# Patient Record
Sex: Male | Born: 1940 | Hispanic: No | Marital: Married | Smoking: Never smoker
Health system: Southern US, Community
[De-identification: ages and names within clinical notes are randomized; demographics above are authoritative.]

## PROBLEM LIST (undated history)

## (undated) DIAGNOSIS — G4730 Sleep apnea, unspecified: Secondary | ICD-10-CM

## (undated) DIAGNOSIS — R7303 Prediabetes: Secondary | ICD-10-CM

## (undated) DIAGNOSIS — H8110 Benign paroxysmal vertigo, unspecified ear: Secondary | ICD-10-CM

## (undated) DIAGNOSIS — K449 Diaphragmatic hernia without obstruction or gangrene: Secondary | ICD-10-CM

## (undated) DIAGNOSIS — IMO0002 Reserved for concepts with insufficient information to code with codable children: Secondary | ICD-10-CM

## (undated) DIAGNOSIS — E785 Hyperlipidemia, unspecified: Secondary | ICD-10-CM

## (undated) DIAGNOSIS — F0390 Unspecified dementia without behavioral disturbance: Secondary | ICD-10-CM

## (undated) DIAGNOSIS — M1990 Unspecified osteoarthritis, unspecified site: Secondary | ICD-10-CM

## (undated) DIAGNOSIS — N400 Enlarged prostate without lower urinary tract symptoms: Secondary | ICD-10-CM

## (undated) DIAGNOSIS — I10 Essential (primary) hypertension: Secondary | ICD-10-CM

## (undated) DIAGNOSIS — K219 Gastro-esophageal reflux disease without esophagitis: Secondary | ICD-10-CM

## (undated) HISTORY — DX: Essential (primary) hypertension: I10

## (undated) HISTORY — PX: FACIAL LACERATIONS REPAIR: SHX1571

## (undated) HISTORY — DX: Hyperlipidemia, unspecified: E78.5

## (undated) HISTORY — DX: Gastro-esophageal reflux disease without esophagitis: K21.9

## (undated) HISTORY — DX: Reserved for concepts with insufficient information to code with codable children: IMO0002

## (undated) HISTORY — DX: Benign prostatic hyperplasia without lower urinary tract symptoms: N40.0

## (undated) HISTORY — DX: Sleep apnea, unspecified: G47.30

---

## 2013-01-21 ENCOUNTER — Ambulatory Visit: Payer: No Typology Code available for payment source | Attending: Internal Medicine | Admitting: Internal Medicine

## 2013-01-21 ENCOUNTER — Ambulatory Visit (HOSPITAL_COMMUNITY)
Admission: RE | Admit: 2013-01-21 | Discharge: 2013-01-21 | Disposition: A | Discharge status: Home / Self Care | Payer: No Typology Code available for payment source | Source: Ambulatory Visit | Attending: Internal Medicine | Admitting: Internal Medicine

## 2013-01-21 VITALS — BP 144/99 | HR 64 | Temp 98.2°F | Resp 16 | Ht 72.0 in | Wt 227.0 lb

## 2013-01-21 DIAGNOSIS — M1710 Unilateral primary osteoarthritis, unspecified knee: Secondary | ICD-10-CM

## 2013-01-21 DIAGNOSIS — K219 Gastro-esophageal reflux disease without esophagitis: Secondary | ICD-10-CM

## 2013-01-21 DIAGNOSIS — M1712 Unilateral primary osteoarthritis, left knee: Secondary | ICD-10-CM

## 2013-01-21 DIAGNOSIS — M25569 Pain in unspecified knee: Secondary | ICD-10-CM | POA: Insufficient documentation

## 2013-01-21 LAB — CBC WITH DIFFERENTIAL/PLATELET
Basophils Absolute: 0.1 10*3/uL (ref 0.0–0.1)
HCT: 48.1 % (ref 39.0–52.0)
Hemoglobin: 16.5 g/dL (ref 13.0–17.0)
Lymphocytes Relative: 56 % — ABNORMAL HIGH (ref 12–46)
Monocytes Absolute: 0.6 10*3/uL (ref 0.1–1.0)
Monocytes Relative: 12 % (ref 3–12)
Neutro Abs: 1.3 10*3/uL — ABNORMAL LOW (ref 1.7–7.7)
WBC: 4.8 10*3/uL (ref 4.0–10.5)

## 2013-01-21 MED ORDER — TRAMADOL HCL 50 MG PO TABS: 50.0000 mg | ORAL_TABLET | Freq: Three times a day (TID) | ORAL | Status: DC | PRN

## 2013-01-21 MED ORDER — TRAMADOL-ACETAMINOPHEN 37.5-325 MG PO TABS: 1.0000 | ORAL_TABLET | Freq: Four times a day (QID) | ORAL | Status: DC | PRN

## 2013-01-21 MED ORDER — ESOMEPRAZOLE MAGNESIUM 40 MG PO CPDR: 40.0000 mg | DELAYED_RELEASE_CAPSULE | Freq: Every day | ORAL | Status: DC

## 2013-01-21 NOTE — Progress Notes (Signed)
Patient presents to establish care; states stiffness and pain in left and right knees; also complains of dizziness when standing up; also complains of acid reflux and nausea after eating x 5 years.

## 2013-01-21 NOTE — Progress Notes (Signed)
Patient ID: Brandon Clark, male   DOB: [birth year], 72 y.o.   MRN: [id number]  CC:  HPI:  72 [facility] care. The patient states that he has had a shuffling gait for one year he complains of severe left knee pain and pain with weight bearing. The patient has been applying icy hot ointment to help control the pain in his knee. He denies taking any other over-the-counter pain killers. His daughter accompanies him and works as an Equities trader He also complains of severe gastroesophageal reflux disease. [place] the patient had an EGD and was told that he needs to have what sounds like dilatation. The patient also needed after enterolysis and follow he denies any recent weight loss   No Known Allergies Past Medical History  Diagnosis Date  . Acid reflux   . Ulcer    No current outpatient prescriptions on file prior to visit.   No current facility-administered medications on file prior to visit.   Family History  Problem Relation Age of Onset  . Diabetes Mother    History   Social History  . Marital Status: Married    Spouse Name: N/A    Number of Children: N/A  . Years of Education: N/A   Occupational History  . Not on file.   Social History Main Topics  . Smoking status: Never Smoker   . Smokeless tobacco: Not on file  . Alcohol Use: No  . Drug Use: No  . Sexually Active: No   Other Topics Concern  . Not on file   Social History Narrative  . No narrative on file    Review of Systems  Constitutional: Negative for fever, chills, diaphoresis, activity change, appetite change and fatigue.  HENT: Negative for ear pain, nosebleeds, congestion, facial swelling, rhinorrhea, neck pain, neck stiffness and ear discharge.   Eyes: Negative for pain, discharge, redness, itching and visual disturbance.  Respiratory: Negative for cough, choking, chest tightness, shortness of  breath, wheezing and stridor.   Cardiovascular: Negative for chest pain, palpitations and leg swelling.  Gastrointestinal: Negative for abdominal distention.  Genitourinary: Negative for dysuria, urgency, frequency, hematuria, flank pain, decreased urine volume, difficulty urinating and dyspareunia.  Musculoskeletal: Negative for back pain, joint swelling, arthralgias and gait problem.  Neurological: Negative for dizziness, tremors, seizures, syncope, facial asymmetry, speech difficulty, weakness, light-headedness, numbness and headaches.  Hematological: Negative for adenopathy. Does not bruise/bleed easily.  Psychiatric/Behavioral: Negative for hallucinations, behavioral problems, confusion, dysphoric mood, decreased concentration and agitation.    Objective:   Filed Vitals:   01/21/13 1207  BP: 144/99  Pulse: 64  Temp: 98.2 F (36.8 C)  Resp: 16    Physical Exam  Constitutional: Appears well-developed and well-nourished. No distress.  HENT: Normocephalic. External right and left ear normal. Oropharynx is clear and moist.  Eyes: Conjunctivae and EOM are normal. PERRLA, no scleral icterus.  Neck: Normal ROM. Neck supple. No JVD. No tracheal deviation. No thyromegaly.  CVS: RRR, S1/S2 +, no murmurs, no gallops, no carotid bruit.  Pulmonary: Effort and breath sounds normal, no stridor, rhonchi, wheezes, rales.  Abdominal: Soft. BS +,  no distension, tenderness, rebound or guarding.  Musculoskeletal: Normal range of motion. No edema and no tenderness.  Lymphadenopathy: No lymphadenopathy noted, cervical, inguinal. Neuro: Alert. Normal reflexes, muscle tone coordination. No cranial nerve deficit. Skin: Skin is warm and dry. No rash noted. Not diaphoretic. No erythema. No pallor.  Psychiatric: Normal mood and affect. Behavior, judgment, thought content normal.   No results found for this basename: WBC, HGB, HCT, MCV, PLT   No results found for this basename: CREATININE, BUN, NA, K,  CL, CO2    No results found for this basename: HGBA1C   Lipid Panel  No results found for this basename: chol, trig, hdl, cholhdl, vldl, ldlcalc       Assessment and plan:   There are no active problems to display for this patient.      Left knee osteoarthritis Plain films will be obtained of both the right and the left knee The patient probably needs a left knee arthroplasty Referral to orthopedics has been provided    New to establish care We'll obtain baseline labs including CBC, CMP, hemoglobin A1c, vitamin D, vitamin B 12  Reflux Disease Referral to Gastroenterology Has Been Provided the Patient Has Been Started on Nexium

## 2013-01-22 LAB — COMPREHENSIVE METABOLIC PANEL
Albumin: 4.2 g/dL (ref 3.5–5.2)
BUN: 17 mg/dL (ref 6–23)
Calcium: 9.7 mg/dL (ref 8.4–10.5)
Chloride: 101 mEq/L (ref 96–112)
Glucose, Bld: 114 mg/dL — ABNORMAL HIGH (ref 70–99)
Potassium: 4.9 mEq/L (ref 3.5–5.3)

## 2013-01-22 LAB — HEMOGLOBIN A1C: Mean Plasma Glucose: 114 mg/dL (ref <117)

## 2013-01-22 LAB — LIPID PANEL: Cholesterol: 210 mg/dL — ABNORMAL HIGH (ref 0–200)

## 2013-01-22 LAB — VITAMIN B12: Vitamin B-12: 1084 pg/mL — ABNORMAL HIGH (ref 211–911)

## 2013-01-23 ENCOUNTER — Encounter: Payer: Self-pay | Admitting: Gastroenterology

## 2013-02-01 ENCOUNTER — Telehealth: Payer: Self-pay | Admitting: Family Medicine

## 2013-02-01 NOTE — Telephone Encounter (Signed)
Zane Herald called in for Mr. ZOXWRUE; pt is experiencing a lot of pain, medication is not working and pt is having trouble walking;

## 2013-02-04 ENCOUNTER — Ambulatory Visit (INDEPENDENT_AMBULATORY_CARE_PROVIDER_SITE_OTHER): Payer: No Typology Code available for payment source | Admitting: Family Medicine

## 2013-02-04 ENCOUNTER — Encounter: Payer: Self-pay | Admitting: Family Medicine

## 2013-02-04 VITALS — BP 122/88 | HR 76 | Ht 75.0 in | Wt 220.0 lb

## 2013-02-04 DIAGNOSIS — M25569 Pain in unspecified knee: Secondary | ICD-10-CM

## 2013-02-04 DIAGNOSIS — M25562 Pain in left knee: Secondary | ICD-10-CM

## 2013-02-04 NOTE — Patient Instructions (Addendum)
Take tylenol 500mg  1-2 tabs three times a day for pain. Aleve 1-2 tabs twice a day with food Glucosamine sulfate 750mg  twice a day is a supplement that has been shown to help with arthritis in some studies. Capsaicin topically up to four times a day may also help with pain. Cortisone injections are an option - you were given these today. If cortisone injections do not help, there are different types of shots that may help but they take longer to take effect. It's important that you continue to stay active. Consider physical therapy to strengthen muscles around the joint that hurts to take pressure off of the joint itself. Shoe inserts with good arch support may be helpful. Walker or cane if needed. Heat or ice 15 minutes at a time 3-4 times a day as needed to help with pain. Water aerobics and cycling with low resistance are the best two types of exercise for arthritis. Follow up with me in 1 month to 6 weeks for reevaluation.

## 2013-02-05 ENCOUNTER — Ambulatory Visit: Payer: No Typology Code available for payment source | Admitting: Family Medicine

## 2013-02-05 ENCOUNTER — Encounter: Payer: Self-pay | Admitting: Family Medicine

## 2013-02-05 DIAGNOSIS — M25562 Pain in left knee: Secondary | ICD-10-CM | POA: Insufficient documentation

## 2013-02-05 NOTE — Progress Notes (Signed)
Patient ID: Brandon Clark, male   DOB: October 02, 1940, 72 y.o.   MRN: [id number]  PCP: Standley Dakins, MD  Subjective:   HPI: Patient is a 72 y.o. male here for bilateral knee pain.  Patient denies known injury. Reports about 5 years of worsening pain in both knees, worse in left knee. No prior problems with knees. Worse with bending down. Pain used to come and go but is more consistent now. Difficult getting up from seated position. Tried tylenol, tramadol, sports cream. + swelling. No catching, locking.  Past Medical History  Diagnosis Date  . Acid reflux   . Ulcer     Current Outpatient Prescriptions on File Prior to Visit  Medication Sig Dispense Refill  . esomeprazole (NEXIUM) 40 MG capsule Take 1 capsule (40 mg total) by mouth daily.  30 capsule  3  . traMADol (ULTRAM) 50 MG tablet Take 1 tablet (50 mg total) by mouth every 8 (eight) hours as needed for pain.  120 tablet  0   No current facility-administered medications on file prior to visit.    History reviewed. No pertinent past surgical history.  No Known Allergies  History   Social History  . Marital Status: Married    Spouse Name: N/A    Number of Children: N/A  . Years of Education: N/A   Occupational History  . Not on file.   Social History Main Topics  . Smoking status: Never Smoker   . Smokeless tobacco: Not on file  . Alcohol Use: No  . Drug Use: No  . Sexually Active: No   Other Topics Concern  . Not on file   Social History Narrative  . No narrative on file    Family History  Problem Relation Age of Onset  . Diabetes Mother   . Diabetes Brother   . Heart attack Neg Hx   . Hyperlipidemia Neg Hx   . Hypertension Neg Hx   . Sudden death Neg Hx     BP 122/88  Pulse 76  Ht 6\' 3"  (1.905 m)  Wt 220 lb (99.791 kg)  BMI 27.5 kg/m2  Review of Systems: See HPI above.    Objective:  Physical Exam:  Gen: NAD  L knee: Mild effusion.  No other gross deformity,  ecchymoses. TTP medial joint line.  No lateral joint line, post patellar facet TTP. FROM. Negative ant/post drawers. Negative valgus/varus testing. Negative lachmanns. Negative mcmurrays, apleys, patellar apprehension. NV intact distally.  R knee: No gross deformity, ecchymoses, swelling. TTP medial joint line.  No lateral joint line, post patellar facet TTP. FROM. Negative ant/post drawers. Negative valgus/varus testing. Negative lachmanns. Negative mcmurrays, apleys, patellar apprehension. NV intact distally.    Assessment & Plan:  1. 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  Discussed tylenol, nsaids, glucosamine, capsaicin.  Bilateral cortisone injections given today as well.  Consider PT - declined for now.  Heat/ice as needed.  F/u in 1 month to 6 weeks.  After informed written consent, patient was seated on exam table. Right knee was prepped with alcohol swab and utilizing anteromedial approach, patient's right knee was injected intraarticularly with 3:1 marcaine: depomedrol. Patient tolerated the procedure well without immediate complications.  After informed written consent, patient was lying supine on exam table. Left knee was prepped with alcohol swab and utilizing superolateral approach, patient's left knee was injected intraarticularly with 3:1 marcaine: depomedrol. Patient tolerated the procedure well without immediate complications.

## 2013-02-05 NOTE — Assessment & Plan Note (Signed)
2/2 DJD.  Discussed tylenol, nsaids, glucosamine, capsaicin.  Bilateral cortisone injections given today as well.  Consider PT - declined for now.  Heat/ice as needed.  F/u in 1 month to 6 weeks.  After informed written consent, patient was seated on exam table. Right knee was prepped with alcohol swab and utilizing anteromedial approach, patient's right knee was injected intraarticularly with 3:1 marcaine: depomedrol. Patient tolerated the procedure well without immediate complications.  After informed written consent, patient was lying supine on exam table. Left knee was prepped with alcohol swab and utilizing superolateral approach, patient's left knee was injected intraarticularly with 3:1 marcaine: depomedrol. Patient tolerated the procedure well without immediate complications.

## 2013-02-11 ENCOUNTER — Telehealth: Payer: Self-pay | Admitting: *Deleted

## 2013-02-11 NOTE — Telephone Encounter (Signed)
02/11/13 Attempt to reach patient not available at number provided . P.[facility]  BSN MHA

## 2013-02-15 ENCOUNTER — Ambulatory Visit: Payer: No Typology Code available for payment source | Admitting: Family Medicine

## 2013-02-20 ENCOUNTER — Ambulatory Visit: Payer: No Typology Code available for payment source | Attending: Family Medicine | Admitting: Internal Medicine

## 2013-02-20 ENCOUNTER — Encounter: Payer: Self-pay | Admitting: Internal Medicine

## 2013-02-20 ENCOUNTER — Ambulatory Visit: Payer: No Typology Code available for payment source | Admitting: Gastroenterology

## 2013-02-20 VITALS — BP 132/94 | HR 70 | Temp 98.0°F | Resp 16 | Ht 74.0 in | Wt 228.0 lb

## 2013-02-20 DIAGNOSIS — R300 Dysuria: Secondary | ICD-10-CM | POA: Insufficient documentation

## 2013-02-20 DIAGNOSIS — M25569 Pain in unspecified knee: Secondary | ICD-10-CM | POA: Insufficient documentation

## 2013-02-20 DIAGNOSIS — K219 Gastro-esophageal reflux disease without esophagitis: Secondary | ICD-10-CM

## 2013-02-20 DIAGNOSIS — M25561 Pain in right knee: Secondary | ICD-10-CM

## 2013-02-20 DIAGNOSIS — G4733 Obstructive sleep apnea (adult) (pediatric): Secondary | ICD-10-CM

## 2013-02-20 MED ORDER — OMEPRAZOLE 40 MG PO CPDR: 40.0000 mg | DELAYED_RELEASE_CAPSULE | Freq: Every day | ORAL | Status: DC

## 2013-02-20 NOTE — Progress Notes (Signed)
PT HERE S/P F/U TO REVIEW LABS,KNEE PAIN AND SLIGHT BURN WITH URINATION.  PT WAS SEEN AT [facility] AND TREATED WITH CORTISONE INJECTION-F/U IN NEXT WEEK. STATES HE FEELS BETTER VSS. NEED ALTERNATIVE FOR NEXIUM. TOO EXPENSIVE

## 2013-02-20 NOTE — Progress Notes (Signed)
Patient Demographics  Brandon Clark, is a 72 y.o. male  ZOX:[id number]  WJX:[id number]  DOB - 09-Mar-1941  Chief Complaint  Patient presents with  . Follow-up    REVIEW LABS,KNEE PAIN,DYSURIA        Subjective:   Brandon Clark OZHYQMV today is here for a follow up visit. Patient was next last visit, however he is been unable to afford it and he is not taking any PPI at all. His main complaints today are persistent reflux-like symptoms and loud snoring at night and somnolence during the daytime. His knees are much better after injection of cortisone at the sports medicine clinic.  Patient has No headache, No chest pain, No abdominal pain - No Nausea, No new weakness tingling or numbness, No Cough - SOB.  Objective:    Filed Vitals:   02/20/13 1025  BP: 132/94  Pulse: 70  Temp: 98 F (36.7 C)  TempSrc: Oral  Resp: 16  Height: 6\' 2"  (1.88 m)  Weight: 228 lb (103.42 kg)  SpO2: 96%     ALLERGIES:  No Known Allergies  PAST MEDICAL HISTORY: Past Medical History  Diagnosis Date  . Acid reflux   . Ulcer     MEDICATIONS AT HOME: Prior to Admission medications   Medication Sig Start Date End Date Taking? Authorizing Provider  esomeprazole (NEXIUM) 40 MG capsule Take 1 capsule (40 mg total) by mouth daily. 01/21/13   Brandon Overlie, MD  omeprazole (PRILOSEC) 40 MG capsule Take 1 capsule (40 mg total) by mouth daily. 02/20/13   Brandon Levora Dredge, MD  traMADol (ULTRAM) 50 MG tablet Take 1 tablet (50 mg total) by mouth every 8 (eight) hours as needed for pain. 01/21/13   Brandon Overlie, MD     Exam  General appearance :Awake, alert, not in any distress. Speech Clear. Not toxic Looking HEENT: Atraumatic and Normocephalic, pupils equally reactive to light and accomodation Neck: supple, no JVD. No cervical lymphadenopathy.  Chest:Good air entry bilaterally, no added sounds  CVS: S1 S2 regular, no murmurs.  Abdomen: Bowel sounds present, Non tender and not distended with no  gaurding, rigidity or rebound. Extremities: B/L Lower Ext shows no edema, both legs are warm to touch Neurology: Awake alert, and oriented X 3, CN II-XII intact, Non focal Skin:No Rash Wounds:N/A    Data Review   CBC No results found for this basename: WBC, HGB, HCT, PLT, MCV, MCH, MCHC, RDW, NEUTRABS, LYMPHSABS, MONOABS, EOSABS, BASOSABS, BANDABS, BANDSABD,  in the last 168 hours  Chemistries   No results found for this basename: NA, K, CL, CO2, GLUCOSE, BUN, CREATININE, GFRCGP, CALCIUM, MG, AST, ALT, ALKPHOS, BILITOT,  in the last 168 hours ------------------------------------------------------------------------------------------------------------------ No results found for this basename: HGBA1C,  in the last 72 hours ------------------------------------------------------------------------------------------------------------------ No results found for this basename: CHOL, HDL, LDLCALC, TRIG, CHOLHDL, LDLDIRECT,  in the last 72 hours ------------------------------------------------------------------------------------------------------------------ No results found for this basename: TSH, T4TOTAL, FREET3, T3FREE, THYROIDAB,  in the last 72 hours ------------------------------------------------------------------------------------------------------------------ No results found for this basename: VITAMINB12, FOLATE, FERRITIN, TIBC, IRON, RETICCTPCT,  in the last 72 hours  Coagulation profile  No results found for this basename: INR, PROTIME,  in the last 168 hours    Assessment & Plan   Gastroesophageal reflux disease - Start omeprazole 40 mg by mouth daily - Try this for 2 months, if no improvements, will need GI referral  Elevated cholesterol - Diet and exercise-repeat lipid panel in 3-6 months-if still elevated then consider statins  Possible OSA -  Referral to the sleep center and pulmonology clinic  Osteoarthritis bilateral knees - Follows at sports medicine, significantly  better after cortisone injections  Return to clinic in 2 months    The patient was given clear instructions to go to ER or return to medical center if symptoms don't improve, worsen or new problems develop. The patient verbalized understanding. The patient was told to call to get lab results if they haven't heard anything in the next week.

## 2013-03-01 ENCOUNTER — Encounter: Payer: Self-pay | Admitting: Family Medicine

## 2013-03-01 ENCOUNTER — Ambulatory Visit (INDEPENDENT_AMBULATORY_CARE_PROVIDER_SITE_OTHER): Payer: No Typology Code available for payment source | Admitting: Family Medicine

## 2013-03-01 VITALS — BP 132/95 | HR 78 | Ht 75.0 in | Wt 220.0 lb

## 2013-03-01 DIAGNOSIS — M25561 Pain in right knee: Secondary | ICD-10-CM

## 2013-03-01 DIAGNOSIS — M25569 Pain in unspecified knee: Secondary | ICD-10-CM

## 2013-03-01 NOTE — Patient Instructions (Addendum)
Continue the osteobiflex every day. Tylenol 500mg  three times a day regularly (not as needed). Capsaicin topically as needed up to 4 times a day. Ice knees as needed 15 minutes at a time. Follow up with me as needed (we can repeat those shots every 3 months if necessary).

## 2013-03-05 ENCOUNTER — Encounter: Payer: Self-pay | Admitting: Family Medicine

## 2013-03-05 NOTE — Progress Notes (Signed)
Patient ID: Brandon Clark, male   DOB: [birth year], 72 y.o.   MRN: [id number]  PCP: Standley Dakins, MD  Subjective:   HPI: Patient is a 72 y.o. male here for f/u bilateral knee pain.  8/4: Patient denies known injury. Reports about 5 years of worsening pain in both knees, worse in left knee. No prior problems with knees. Worse with bending down. Pain used to come and go but is more consistent now. Difficult getting up from seated position. Tried tylenol, tramadol, sports cream. + swelling. No catching, locking.  8/29: Patient reports he's over 50% better now since the injections. Some pain especially in left knee. Swelling as well more on left side. Taking osteobiflex and tylenol as needed.  Past Medical History  Diagnosis Date  . Acid reflux   . Ulcer     Current Outpatient Prescriptions on File Prior to Visit  Medication Sig Dispense Refill  . esomeprazole (NEXIUM) 40 MG capsule Take 1 capsule (40 mg total) by mouth daily.  30 capsule  3  . omeprazole (PRILOSEC) 40 MG capsule Take 1 capsule (40 mg total) by mouth daily.  30 capsule  3  . traMADol (ULTRAM) 50 MG tablet Take 1 tablet (50 mg total) by mouth every 8 (eight) hours as needed for pain.  120 tablet  0   No current facility-administered medications on file prior to visit.    History reviewed. No pertinent past surgical history.  No Known Allergies  History   Social History  . Marital Status: Married    Spouse Name: N/A    Number of Children: N/A  . Years of Education: N/A   Occupational History  . Not on file.   Social History Main Topics  . Smoking status: Never Smoker   . Smokeless tobacco: Not on file  . Alcohol Use: No  . Drug Use: No  . Sexual Activity: No   Other Topics Concern  . Not on file   Social History Narrative  . No narrative on file    Family History  Problem Relation Age of Onset  . Diabetes Mother   . Diabetes Brother   . Heart attack Neg Hx   .  Hyperlipidemia Neg Hx   . Hypertension Neg Hx   . Sudden death Neg Hx     BP 132/95  Pulse 78  Ht 6\' 3"  (1.905 m)  Wt 220 lb (99.791 kg)  BMI 27.5 kg/m2  Review of Systems: See HPI above.    Objective:  Physical Exam:  Gen: NAD  L knee: Mild effusion.  No other gross deformity, ecchymoses. Minimal TTP medial joint line.  No lateral joint line, post patellar facet TTP. FROM. Negative ant/post drawers. Negative valgus/varus testing. Negative lachmanns. Negative mcmurrays, apleys, patellar apprehension. NV intact distally.  R knee: No gross deformity, ecchymoses, swelling. No TTP medial joint line.  No lateral joint line, post patellar facet TTP. FROM. Negative ant/post drawers. Negative valgus/varus testing. Negative lachmanns. Negative mcmurrays, apleys, patellar apprehension. NV intact distally.    Assessment & Plan:  1. 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  Improved since last visit.  Continue osteobiflex.  Advised to start taking tylenol more regularly.  Icing as needed.  F/u prn.

## 2013-03-05 NOTE — Assessment & Plan Note (Signed)
2/2 DJD.  Improved since last visit.  Continue osteobiflex.  Advised to start taking tylenol more regularly.  Icing as needed.  F/u prn.

## 2013-03-25 ENCOUNTER — Institutional Professional Consult (permissible substitution): Payer: No Typology Code available for payment source | Admitting: Pulmonary Disease

## 2013-03-28 ENCOUNTER — Encounter: Payer: Self-pay | Admitting: Pulmonary Disease

## 2013-04-22 ENCOUNTER — Ambulatory Visit: Payer: No Typology Code available for payment source

## 2013-09-13 ENCOUNTER — Encounter: Payer: Self-pay | Admitting: Family Medicine

## 2013-09-13 ENCOUNTER — Ambulatory Visit (INDEPENDENT_AMBULATORY_CARE_PROVIDER_SITE_OTHER): Payer: No Typology Code available for payment source | Admitting: Family Medicine

## 2013-09-13 ENCOUNTER — Encounter (INDEPENDENT_AMBULATORY_CARE_PROVIDER_SITE_OTHER): Payer: Self-pay

## 2013-09-13 VITALS — HR 76 | Ht 74.0 in | Wt 200.0 lb

## 2013-09-13 DIAGNOSIS — M25519 Pain in unspecified shoulder: Secondary | ICD-10-CM

## 2013-09-13 DIAGNOSIS — M25561 Pain in right knee: Secondary | ICD-10-CM

## 2013-09-13 DIAGNOSIS — M25512 Pain in left shoulder: Principal | ICD-10-CM

## 2013-09-13 DIAGNOSIS — M25569 Pain in unspecified knee: Secondary | ICD-10-CM

## 2013-09-13 DIAGNOSIS — M25511 Pain in right shoulder: Secondary | ICD-10-CM

## 2013-09-13 DIAGNOSIS — M25562 Pain in left knee: Secondary | ICD-10-CM

## 2013-09-13 NOTE — Patient Instructions (Signed)
You have rotator cuff impingement Try to avoid painful activities (overhead activities, lifting with extended arm) as much as possible. Aleve 2 tabs twice a day with food OR ibuprofen 3 tabs three times a day with food for pain and inflammation - take regularly for 1-2 weeks then as needed. Can take tylenol in addition to this. Subacromial injection may be beneficial to help with pain and to decrease inflammation. Do home exercise program with theraband and scapular stabilization exercises daily - these are very important for long term relief. If not improving at follow-up we will consider injection, physical therapy and/or nitro patches.  Your knee pain is due to arthritis and swelling due to the arthritis. Take tylenol 500mg  1-2 tabs three times a day for pain. Aleve 2 tabs twice a day with food OR ibuprofen 3 tabs three times a day with food for pain and inflammation.  Glucosamine sulfate 750mg  twice a day is a supplement that may help. Capsaicin topically up to four times a day may also help with pain. Cortisone injections are an option - you were given these today. It's important that you continue to stay active. Straight leg raises, knee extensions 3 sets of 10 once a day (add ankle weight if these become too easy). Consider physical therapy to strengthen muscles around the joint that hurts to take pressure off of the joint itself. Shoe inserts with good arch support may be helpful. Walker or cane if needed. Heat or ice 15 minutes at a time 3-4 times a day as needed to help with pain. Water aerobics and cycling with low resistance are the best two types of exercise for arthritis. Follow up with me in 6 weeks.

## 2013-09-17 ENCOUNTER — Encounter: Payer: Self-pay | Admitting: Family Medicine

## 2013-09-17 DIAGNOSIS — M25511 Pain in right shoulder: Secondary | ICD-10-CM | POA: Insufficient documentation

## 2013-09-17 DIAGNOSIS — M25512 Pain in left shoulder: Principal | ICD-10-CM

## 2013-09-17 NOTE — Assessment & Plan Note (Signed)
2/2 DJD.  Repeated injections today and aspirated left knee as well.  Restart osteobiflex.  Tylenol, icing, capsaicin.  F/u prn for this issue.  After informed written consent patient was lying supine on exam table.  Left knee was prepped with  alcohol swab.  Utilizing superolateral approach, 3 mL of marcaine was used for local anesthesia.  Then using an 18g needle on 60cc syringe, 49 mL of clear straw-colored fluid was aspirated from left knee.  Knee was then injected with 3:1 marcaine:depomedrol.  Patient tolerated procedure well without immediate complications  After informed written consent, patient was seated on exam table. Right knee was prepped with alcohol swab and utilizing anteromedial approach, patient's right knee was injected intraarticularly with 3:1 marcaine: depomedrol. Patient tolerated the procedure well without immediate complications.

## 2013-09-17 NOTE — Progress Notes (Signed)
Patient ID: Brandon Clark, male   DOB: Jan 19, 1941, 73 y.o.   MRN: [id number]  PCP: Jeanann Lewandowsky, MD  Subjective:   HPI: Patient is a 73 y.o. male here for f/u bilateral knee pain, shoulder pain.  8/4: Patient denies known injury. Reports about 5 years of worsening pain in both knees, worse in left knee. No prior problems with knees. Worse with bending down. Pain used to come and go but is more consistent now. Difficult getting up from seated position. Tried tylenol, tramadol, sports cream. + swelling. No catching, locking.  8/29: Patient reports he's over 50% better now since the injections. Some pain especially in left knee. Swelling as well more on left side. Taking osteobiflex and tylenol as needed.  09/13/13: Patient returns for bilateral knee pain. Injections helped quite a bit for at least 3 months. Still getting some pain in knees. Left one swelling. On Saturday felt like knee was giving out. No longer taking osteobiflex. Not icing or bracing. Also having soreness in both shoulders without history of injury.  Past Medical History  Diagnosis Date  . Acid reflux   . Ulcer     Current Outpatient Prescriptions on File Prior to Visit  Medication Sig Dispense Refill  . esomeprazole (NEXIUM) 40 MG capsule Take 1 capsule (40 mg total) by mouth daily.  30 capsule  3  . omeprazole (PRILOSEC) 40 MG capsule Take 1 capsule (40 mg total) by mouth daily.  30 capsule  3  . traMADol (ULTRAM) 50 MG tablet Take 1 tablet (50 mg total) by mouth every 8 (eight) hours as needed for pain.  120 tablet  0   No current facility-administered medications on file prior to visit.    History reviewed. No pertinent past surgical history.  No Known Allergies  History   Social History  . Marital Status: Married    Spouse Name: N/A    Number of Children: N/A  . Years of Education: N/A   Occupational History  . Not on file.   Social History Main Topics  . Smoking  status: Never Smoker   . Smokeless tobacco: Not on file  . Alcohol Use: No  . Drug Use: No  . Sexual Activity: No   Other Topics Concern  . Not on file   Social History Narrative  . No narrative on file    Family History  Problem Relation Age of Onset  . Diabetes Mother   . Diabetes Brother   . Heart attack Neg Hx   . Hyperlipidemia Neg Hx   . Hypertension Neg Hx   . Sudden death Neg Hx     Pulse 76  Ht 6\' 2"  (1.88 m)  Wt 200 lb (90.719 kg)  BMI 25.67 kg/m2  Review of Systems: See HPI above.    Objective:  Physical Exam:  Gen: NAD  L knee: Mod effusion.  No other gross deformity, ecchymoses. Mild TTP medial joint line.  No lateral joint line, post patellar facet TTP. FROM. Negative ant/post drawers. Negative valgus/varus testing. Negative lachmanns. Negative mcmurrays, apleys, patellar apprehension. NV intact distally.  R knee: No gross deformity, ecchymoses, swelling. Mild medial and lateral joint line tenderness.  No post patellar facet TTP. FROM. Negative ant/post drawers. Negative valgus/varus testing. Negative lachmanns. Negative mcmurrays, apleys, patellar apprehension. NV intact distally.  Bilateral shoulders: No swelling, ecchymoses.  No gross deformity. No TTP. FROM with painful arcs. Positive Hawkins, Neers. Negative Speeds, Yergasons. Strength 5/5 with empty can and resisted internal/external rotation.  Pain empty can. Negative apprehension. NV intact distally.    Assessment & Plan:  1. 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  Repeated injections today and aspirated left knee as well.  Restart osteobiflex.  Tylenol, icing, capsaicin.  F/u prn for this issue.  After informed written consent patient was lying supine on exam table.  Left knee was prepped with  alcohol swab.  Utilizing superolateral approach, 3 mL of marcaine was used for local anesthesia.  Then using an 18g needle on 60cc syringe, 49 mL of clear straw-colored fluid was aspirated from  left knee.  Knee was then injected with 3:1 marcaine:depomedrol.  Patient tolerated procedure well without immediate complications  After informed written consent, patient was seated on exam table. Right knee was prepped with alcohol swab and utilizing anteromedial approach, patient's right knee was injected intraarticularly with 3:1 marcaine: depomedrol. Patient tolerated the procedure well without immediate complications.  2. Bilateral shoulder pain - 2/2 rotator cuff impingement.  Shown home exercise program.  Icing, nsaids as needed.  Consider PT, nitro patches, injections if not improving over next 6 weeks.

## 2013-09-17 NOTE — Assessment & Plan Note (Signed)
2/2 rotator cuff impingement.  Shown home exercise program.  Icing, nsaids as needed.  Consider PT, nitro patches, injections if not improving over next 6 weeks.

## 2013-10-25 ENCOUNTER — Ambulatory Visit (INDEPENDENT_AMBULATORY_CARE_PROVIDER_SITE_OTHER): Payer: No Typology Code available for payment source | Admitting: Family Medicine

## 2013-10-25 ENCOUNTER — Encounter: Payer: Self-pay | Admitting: Family Medicine

## 2013-10-25 VITALS — BP 141/97 | HR 81 | Ht 75.0 in | Wt 200.0 lb

## 2013-10-25 DIAGNOSIS — M25562 Pain in left knee: Secondary | ICD-10-CM

## 2013-10-25 DIAGNOSIS — M25519 Pain in unspecified shoulder: Secondary | ICD-10-CM

## 2013-10-25 DIAGNOSIS — M25512 Pain in left shoulder: Secondary | ICD-10-CM

## 2013-10-25 DIAGNOSIS — M25569 Pain in unspecified knee: Secondary | ICD-10-CM

## 2013-10-25 MED ORDER — METHYLPREDNISOLONE ACETATE 40 MG/ML IJ SUSP
40.0000 mg | Freq: Once | INTRAMUSCULAR | Status: AC
  Administered 2013-10-25: 40 mg via INTRA_ARTICULAR

## 2013-10-25 NOTE — Patient Instructions (Signed)
You have rotator cuff impingement Try to avoid painful activities (overhead activities, lifting with extended arm) as much as possible. Aleve 2 tabs twice a day with food OR ibuprofen 3 tabs three times a day with food for pain and inflammation as needed.. Can take tylenol in addition to this. Subacromial injection may be beneficial to help with pain and to decrease inflammation - you were given this today. Do home exercise program with theraband and scapular stabilization exercises daily - these are very important for long term relief. If not improving at follow-up we will consider physical therapy and/or nitro patches.  Your knee pain and swelling are due to arthritis, less likely a meniscus tear. Take tylenol 500mg  1-2 tabs three times a day for pain. Aleve 2 tabs twice a day with food OR ibuprofen 3 tabs three times a day with food for pain and inflammation.  Glucosamine sulfate 750mg  twice a day is a supplement that may help. Capsaicin topically up to four times a day may also help with pain. Cortisone injection is an option - you were given this today. It's important that you continue to stay active. Straight leg raises, knee extensions 3 sets of 10 once a day (add ankle weight if these become too easy). Consider physical therapy to strengthen muscles around the joint that hurts to take pressure off of the joint itself. Shoe inserts with good arch support may be helpful. Walker or cane if needed. Heat or ice 15 minutes at a time 3-4 times a day as needed to help with pain. Water aerobics and cycling with low resistance are the best two types of exercise for arthritis. If you're not improving we will go ahead with an MRI. Follow up with me in 6 weeks.

## 2013-10-28 ENCOUNTER — Encounter: Payer: Self-pay | Admitting: Family Medicine

## 2013-10-28 NOTE — Assessment & Plan Note (Signed)
2/2 DJD, possible degenerative meniscus tear.  Discussed options as last aspiration/injection did not provide as lengthy of relief as I would like.  He would like to try aspiration and injection again.  Ultrasound guidance used and ensured medication was into knee joint.  If not improving still would consider MRI.  After informed written consent patient was lying supine on exam table.  Left knee was prepped with  alcohol swab.  Utilizing superolateral approach, 3 mL of marcaine was used for local anesthesia.  Then using an 18g needle on two 60cc syringes, 87 mL of clear straw-colored fluid was aspirated from left knee.  Knee was then injected under ultrasound guidance with 3:1 marcaine:depomedrol.  Patient tolerated procedure well without immediate complications

## 2013-10-28 NOTE — Assessment & Plan Note (Signed)
2/2 rotator cuff impingement.  Continue home exercise program.  Subacromial injection given.  Icing, nsaids as needed.  Consider PT, nitro patches if not improving.  After informed written consent, patient was seated on exam table. Left shoulder was prepped with alcohol swab and utilizing posterior approach, patient's left subacromial space was injected with 3:1 marcaine: depomedrol. Patient tolerated the procedure well without immediate complications.  F/u in 6 weeks.

## 2013-10-28 NOTE — Progress Notes (Signed)
Patient ID: Brandon Clark, male   DOB: 04-08-1941, 73 y.o.   MRN: [id number]  PCP: Jeanann LewandowskyJEGEDE, OLUGBEMIGA, MD  Subjective:   HPI: Patient is a 73 y.o. male here for f/u left knee pain, left shoulder pain.  8/4: Patient denies known injury. Reports about 5 years of worsening pain in both knees, worse in left knee. No prior problems with knees. Worse with bending down. Pain used to come and go but is more consistent now. Difficult getting up from seated position. Tried tylenol, tramadol, sports cream. + swelling. No catching, locking.  8/29: Patient reports he's over 50% better now since the injections. Some pain especially in left knee. Swelling as well more on left side. Taking osteobiflex and tylenol as needed.  09/13/13: Patient returns for bilateral knee pain. Injections helped quite a bit for at least 3 months. Still getting some pain in knees. Left one swelling. On Saturday felt like knee was giving out. No longer taking osteobiflex. Not icing or bracing. Also having soreness in both shoulders without history of injury.  4/24: Patient returns with a lot of swelling, worsening pain in left knee for past week. Difficult to walk as a result, is limping. Left shoulder bothering him though right shoulder has improved. Taking advil, icing both knee and shoulder. Doing home exercises we reviewed also.  Past Medical History  Diagnosis Date  . Acid reflux   . Ulcer     Current Outpatient Prescriptions on File Prior to Visit  Medication Sig Dispense Refill  . esomeprazole (NEXIUM) 40 MG capsule Take 1 capsule (40 mg total) by mouth daily.  30 capsule  3  . omeprazole (PRILOSEC) 40 MG capsule Take 1 capsule (40 mg total) by mouth daily.  30 capsule  3  . traMADol (ULTRAM) 50 MG tablet Take 1 tablet (50 mg total) by mouth every 8 (eight) hours as needed for pain.  120 tablet  0   No current facility-administered medications on file prior to visit.    History  reviewed. No pertinent past surgical history.  No Known Allergies  History   Social History  . Marital Status: Married    Spouse Name: N/A    Number of Children: N/A  . Years of Education: N/A   Occupational History  . Not on file.   Social History Main Topics  . Smoking status: Never Smoker   . Smokeless tobacco: Not on file  . Alcohol Use: No  . Drug Use: No  . Sexual Activity: No   Other Topics Concern  . Not on file   Social History Narrative  . No narrative on file    Family History  Problem Relation Age of Onset  . Diabetes Mother   . Diabetes Brother   . Heart attack Neg Hx   . Hyperlipidemia Neg Hx   . Hypertension Neg Hx   . Sudden death Neg Hx     BP 141/97  Pulse 81  Ht 6\' 3"  (1.905 m)  Wt 200 lb (90.719 kg)  BMI 25.00 kg/m2  Review of Systems: See HPI above.    Objective:  Physical Exam:  Gen: NAD  L knee: Large effusion.  No other gross deformity, ecchymoses. Mild TTP medial joint line.  No lateral joint line, post patellar facet TTP. ROM 0 - 110 degrees. Negative ant/post drawers. Negative valgus/varus testing. Negative lachmanns. Negative mcmurrays, apleys, patellar apprehension. NV intact distally.  Left shoulder: No swelling, ecchymoses.  No gross deformity. No TTP. FROM with painful  arc. Positive Juanetta GoslingHawkins, Neers. Negative Speeds, Yergasons. Strength 5/5 with empty can and resisted internal/external rotation.  Pain empty can. Negative apprehension. NV intact distally.    Assessment & Plan:  1. Left knee pain - 2/2 DJD, possible degenerative meniscus tear.  Discussed options as last aspiration/injection did not provide as lengthy of relief as I would like.  He would like to try aspiration and injection again.  Ultrasound guidance used and ensured medication was into knee joint.  If not improving still would consider MRI.  After informed written consent patient was lying supine on exam table.  Left knee was prepped with  alcohol  swab.  Utilizing superolateral approach, 3 mL of marcaine was used for local anesthesia.  Then using an 18g needle on two 60cc syringes, 87 mL of clear straw-colored fluid was aspirated from left knee.  Knee was then injected under ultrasound guidance with 3:1 marcaine:depomedrol.  Patient tolerated procedure well without immediate complications  2. Left shoulder pain - 2/2 rotator cuff impingement.  Continue home exercise program.  Subacromial injection given.  Icing, nsaids as needed.  Consider PT, nitro patches if not improving.  After informed written consent, patient was seated on exam table. Left shoulder was prepped with alcohol swab and utilizing posterior approach, patient's left subacromial space was injected with 3:1 marcaine: depomedrol. Patient tolerated the procedure well without immediate complications.  F/u in 6 weeks.

## 2013-12-06 ENCOUNTER — Encounter: Payer: Self-pay | Admitting: Family Medicine

## 2013-12-06 ENCOUNTER — Ambulatory Visit (INDEPENDENT_AMBULATORY_CARE_PROVIDER_SITE_OTHER): Payer: No Typology Code available for payment source | Admitting: Family Medicine

## 2013-12-06 VITALS — BP 132/93 | Ht 73.0 in | Wt 200.0 lb

## 2013-12-06 DIAGNOSIS — M25569 Pain in unspecified knee: Secondary | ICD-10-CM

## 2013-12-06 DIAGNOSIS — M25512 Pain in left shoulder: Secondary | ICD-10-CM

## 2013-12-06 DIAGNOSIS — M25562 Pain in left knee: Secondary | ICD-10-CM

## 2013-12-06 DIAGNOSIS — M25519 Pain in unspecified shoulder: Secondary | ICD-10-CM

## 2013-12-06 MED ORDER — NITROGLYCERIN 0.2 MG/HR TD PT24: MEDICATED_PATCH | TRANSDERMAL | Status: DC

## 2013-12-06 NOTE — Patient Instructions (Signed)
Consider physical therapy for your knees and shoulders. Aleve 2 tabs twice a day with food OR ibuprofen 3 tabs three times a day with food for pain and inflammation as needed (these can increase your blood pressure). Can take tylenol in addition to this. Nitro patches - cut into 1/4ths and place one of these over your left shoulder, change daily. Do home exercise program with theraband and scapular stabilization exercises daily - these are very important for long term relief. If not improving at follow-up we will consider physical therapy. Follow up with me in 6 weeks.  Your knee pain and swelling are due to arthritis, less likely a meniscus tear. Continue measures below but also consider physical therapy, MRI as we discussed. Take tylenol 500mg  1-2 tabs three times a day for pain. Aleve 2 tabs twice a day with food OR ibuprofen 3 tabs three times a day with food for pain and inflammation.  Glucosamine sulfate 750mg  twice a day is a supplement that may help. Capsaicin topically up to four times a day may also help with pain. It's important that you continue to stay active. Straight leg raises, knee extensions 3 sets of 10 once a day (add ankle weight if these become too easy). Consider physical therapy to strengthen muscles around the joint that hurts to take pressure off of the joint itself. Shoe inserts with good arch support may be helpful. Walker or cane if needed. Heat or ice 15 minutes at a time 3-4 times a day as needed to help with pain. Water aerobics and cycling with low resistance are the best two types of exercise for arthritis. Follow up with me in 6 weeks.

## 2013-12-11 ENCOUNTER — Encounter: Payer: Self-pay | Admitting: Family Medicine

## 2013-12-11 NOTE — Progress Notes (Signed)
Patient ID: Brandon Clark, male   DOB: [birth year], 73 y.o.   MRN: [id number]  PCP: Jeanann Lewandowsky, MD  Subjective:   HPI: Patient is a 73 y.o. male here for f/u left knee pain, left shoulder pain.  8/4: Patient denies known injury. Reports about 5 years of worsening pain in both knees, worse in left knee. No prior problems with knees. Worse with bending down. Pain used to come and go but is more consistent now. Difficult getting up from seated position. Tried tylenol, tramadol, sports cream. + swelling. No catching, locking.  8/29: Patient reports he's over 50% better now since the injections. Some pain especially in left knee. Swelling as well more on left side. Taking osteobiflex and tylenol as needed.  09/13/13: Patient returns for bilateral knee pain. Injections helped quite a bit for at least 3 months. Still getting some pain in knees. Left one swelling. On Saturday felt like knee was giving out. No longer taking osteobiflex. Not icing or bracing. Also having soreness in both shoulders without history of injury.  4/24: Patient returns with a lot of swelling, worsening pain in left knee for past week. Difficult to walk as a result, is limping. Left shoulder bothering him though right shoulder has improved. Taking advil, icing both knee and shoulder. Doing home exercises we reviewed also.  6/5: Patient reports he feels a little bit better than last visit - still having left knee and shoulder pain. Doing home exercises for both. Takes advil, ices as needed. Injections provided some relief but not complete. Walks with a mild limp.  Past Medical History  Diagnosis Date  . Acid reflux   . Ulcer     Current Outpatient Prescriptions on File Prior to Visit  Medication Sig Dispense Refill  . esomeprazole (NEXIUM) 40 MG capsule Take 1 capsule (40 mg total) by mouth daily.  30 capsule  3  . omeprazole (PRILOSEC) 40 MG capsule Take 1 capsule (40 mg total)  by mouth daily.  30 capsule  3  . traMADol (ULTRAM) 50 MG tablet Take 1 tablet (50 mg total) by mouth every 8 (eight) hours as needed for pain.  120 tablet  0   No current facility-administered medications on file prior to visit.    History reviewed. No pertinent past surgical history.  No Known Allergies  History   Social History  . Marital Status: Married    Spouse Name: N/A    Number of Children: N/A  . Years of Education: N/A   Occupational History  . Not on file.   Social History Main Topics  . Smoking status: Never Smoker   . Smokeless tobacco: Not on file  . Alcohol Use: No  . Drug Use: No  . Sexual Activity: No   Other Topics Concern  . Not on file   Social History Narrative  . No narrative on file    Family History  Problem Relation Age of Onset  . Diabetes Mother   . Diabetes Brother   . Heart attack Neg Hx   . Hyperlipidemia Neg Hx   . Hypertension Neg Hx   . Sudden death Neg Hx     BP 132/93  Ht 6\' 1"  (1.854 m)  Wt 200 lb (90.719 kg)  BMI 26.39 kg/m2  Review of Systems: See HPI above.    Objective:  Physical Exam:  Gen: NAD  L knee: Moderate effusion.  No other gross deformity, ecchymoses. Mild TTP medial joint line.  No lateral joint line,  post patellar facet TTP. ROM 0 - 110 degrees. Negative ant/post drawers. Negative valgus/varus testing. Negative lachmanns. Negative mcmurrays, apleys, patellar apprehension. NV intact distally.  Left shoulder: No swelling, ecchymoses.  No gross deformity. No TTP. FROM with painful arc. Positive Hawkins, Neers. Negative Speeds, Yergasons. Strength 5/5 with empty can and resisted internal/external rotation.  Pain empty can. Negative apprehension. NV intact distally.    Assessment & Plan:  1. Left knee pain - 2/2 DJD, possible degenerative meniscus tear.  Would not repeat another aspiration/injection as hasn't seemed to help him that much.  Again discussed MRI - he's not interested in this at  this time.  2. Left shoulder pain - 2/2 rotator cuff impingement.  Continue home exercise program.  Subacromial injection given last visit with only mild relief.  He's willing to try nitro patches - 1/4th patch over this shoulder, change daily - discussed risks of headache, skin irritation.  Consider physical therapy - declined again today.

## 2013-12-11 NOTE — Assessment & Plan Note (Signed)
2/2 DJD, possible degenerative meniscus tear.  Would not repeat another aspiration/injection as hasn't seemed to help him that much.  Again discussed MRI - he's not interested in this at this time.

## 2013-12-11 NOTE — Assessment & Plan Note (Signed)
2/2 rotator cuff impingement.  Continue home exercise program.  Subacromial injection given last visit with only mild relief.  He's willing to try nitro patches - 1/4th patch over this shoulder, change daily - discussed risks of headache, skin irritation.  Consider physical therapy - declined again today.

## 2014-01-09 ENCOUNTER — Ambulatory Visit: Payer: No Typology Code available for payment source | Attending: Internal Medicine | Admitting: Internal Medicine

## 2014-01-09 ENCOUNTER — Encounter: Payer: Self-pay | Admitting: Internal Medicine

## 2014-01-09 VITALS — BP 137/79 | HR 72 | Temp 98.1°F | Resp 18 | Ht 72.84 in | Wt 239.8 lb

## 2014-01-09 DIAGNOSIS — R0683 Snoring: Secondary | ICD-10-CM

## 2014-01-09 DIAGNOSIS — R0609 Other forms of dyspnea: Secondary | ICD-10-CM

## 2014-01-09 DIAGNOSIS — R0989 Other specified symptoms and signs involving the circulatory and respiratory systems: Secondary | ICD-10-CM

## 2014-01-09 DIAGNOSIS — K920 Hematemesis: Secondary | ICD-10-CM

## 2014-01-09 DIAGNOSIS — K219 Gastro-esophageal reflux disease without esophagitis: Secondary | ICD-10-CM

## 2014-01-09 LAB — CBC
HCT: 44.9 % (ref 39.0–52.0)
HEMOGLOBIN: 15.6 g/dL (ref 13.0–17.0)
MCH: 29.9 pg (ref 26.0–34.0)
MCHC: 34.7 g/dL (ref 30.0–36.0)
MCV: 86 fL (ref 78.0–100.0)
Platelets: 224 10*3/uL (ref 150–400)
RBC: 5.22 MIL/uL (ref 4.22–5.81)
RDW: 14.1 % (ref 11.5–15.5)
WBC: 4.8 10*3/uL (ref 4.0–10.5)

## 2014-01-09 MED ORDER — PANTOPRAZOLE SODIUM 40 MG PO TBEC: 40.0000 mg | DELAYED_RELEASE_TABLET | Freq: Every day | ORAL | Status: DC

## 2014-01-09 NOTE — Progress Notes (Signed)
Patient presents for abdominal pain and bloating for 3 years States started vomiting black emesis 10 days ago  States relief with nexium 40 mg Male relative present to interpret

## 2014-01-09 NOTE — Progress Notes (Signed)
Patient ID: Brandon Clark, male   DOB: May 11, 1941, 73 y.o.   MRN: [id number]  CC: abdominal pain, vomiting, snoring  HPI:  Patient reports today with c/o abdominal pain and bloating for the past 3 years.  He states that he vomited black emesis 10 days ago. Patient has a family member present to interpret during exam.  He states that he was evaluated in [place] and was told that he needed a stomach surgery but he does not know how to explain why.  He states that he was evaluated in [place] for then for the same c/o of vomiting blood and states that his endoscopy revealed "something abnormal".  Patient states that he is now having bloating, gas, and hiccups.  After each meal he feels like his "stomach swells" and needs to vomit.  Ten days ago he states the pain was worst and that is when he vomited black emesis and states that he saw spots of red blood in it as well.   Patient reports loud snoring with some reported apneic events.  States he does not have daytime sleepiness but does admit to headaches.  He states that he often wakes up gaging to catch his breath when he snores.    No Known Allergies Past Medical History  Diagnosis Date  . Acid reflux   . Ulcer    Current Outpatient Prescriptions on File Prior to Visit  Medication Sig Dispense Refill  . esomeprazole (NEXIUM) 40 MG capsule Take 1 capsule (40 mg total) by mouth daily.  30 capsule  3  . nitroGLYCERIN (NITRODUR - DOSED IN MG/24 HR) 0.2 mg/hr patch 1/4th patch over affected shoulder, change daily  30 patch  1  . omeprazole (PRILOSEC) 40 MG capsule Take 1 capsule (40 mg total) by mouth daily.  30 capsule  3  . traMADol (ULTRAM) 50 MG tablet Take 1 tablet (50 mg total) by mouth every 8 (eight) hours as needed for pain.  120 tablet  0   No current facility-administered medications on file prior to visit.   Family History  Problem Relation Age of Onset  . Diabetes Mother   . Diabetes Brother   . Heart attack Neg Hx   .  Hyperlipidemia Neg Hx   . Hypertension Neg Hx   . Sudden death Neg Hx    History   Social History  . Marital Status: Married    Spouse Name: N/A    Number of Children: N/A  . Years of Education: N/A   Occupational History  . Not on file.   Social History Main Topics  . Smoking status: Never Smoker   . Smokeless tobacco: Not on file  . Alcohol Use: No  . Drug Use: No  . Sexual Activity: No   Other Topics Concern  . Not on file   Social History Narrative  . No narrative on file   Review of Systems  Constitutional: Negative.   Respiratory: Negative.   Cardiovascular: Negative.   Gastrointestinal: Positive for heartburn, vomiting and abdominal pain. Negative for nausea, blood in stool and melena.     Objective:   Filed Vitals:   01/09/14 1109  BP: 137/79  Pulse: 72  Temp: 98.1 F (36.7 C)  Resp: 18   Physical Exam  Cardiovascular: Normal rate, regular rhythm and normal heart sounds.   Pulmonary/Chest: Effort normal and breath sounds normal.  Abdominal: Soft. Bowel sounds are normal. He exhibits no distension and no mass. There is tenderness (generalized).  Skin: Skin  is warm and dry. He is not diaphoretic.  Psychiatric: He has a normal mood and affect. Thought content normal.   .  Lab Results  Component Value Date   WBC 4.8 01/21/2013   HGB 16.5 01/21/2013   HCT 48.1 01/21/2013   MCV 86.2 01/21/2013   PLT 242 01/21/2013   Lab Results  Component Value Date   CREATININE 1.02 01/21/2013   BUN 17 01/21/2013   NA 137 01/21/2013   K 4.9 01/21/2013   CL 101 01/21/2013   CO2 29 01/21/2013    Lab Results  Component Value Date   HGBA1C 5.6 01/21/2013   Lipid Panel     Component Value Date/Time   CHOL 210* 01/21/2013 1229   TRIG 94 01/21/2013 1229   HDL 46 01/21/2013 1229   CHOLHDL 4.6 01/21/2013 1229   VLDL 19 01/21/2013 1229   LDLCALC 145* 01/21/2013 1229       Assessment and plan:   Brandon Clark was seen today for abdominal pain.  Diagnoses and associated  orders for this visit:  Hematemesis without nausea - Ambulatory referral to Gastroenterology - CBC  Gastroesophageal reflux disease, esophagitis presence not specified - pantoprazole (PROTONIX) 40 MG tablet; Take 1 tablet (40 mg total) by mouth daily.  Snoring - Ambulatory referral to Sleep Studies   Return if symptoms worsen or fail to improve, for Abdominal Pain. Explained to patient when to RTC or ER for further evaluation.     Holland Commons, NP-C [facility] [phone] 01/09/2014, 11:45 AM

## 2014-01-24 ENCOUNTER — Encounter: Payer: Self-pay | Admitting: Gastroenterology

## 2014-02-07 ENCOUNTER — Ambulatory Visit: Payer: Self-pay | Admitting: Family Medicine

## 2014-02-12 ENCOUNTER — Emergency Department (HOSPITAL_COMMUNITY)
Admission: EM | Admit: 2014-02-12 | Discharge: 2014-02-12 | Disposition: A | Discharge status: Home / Self Care | Payer: No Typology Code available for payment source | Attending: Emergency Medicine | Admitting: Emergency Medicine

## 2014-02-12 ENCOUNTER — Encounter (HOSPITAL_COMMUNITY): Payer: Self-pay | Admitting: Emergency Medicine

## 2014-02-12 DIAGNOSIS — W260XXA Contact with knife, initial encounter: Secondary | ICD-10-CM | POA: Insufficient documentation

## 2014-02-12 DIAGNOSIS — S61215A Laceration without foreign body of left ring finger without damage to nail, initial encounter: Secondary | ICD-10-CM

## 2014-02-12 DIAGNOSIS — Z23 Encounter for immunization: Secondary | ICD-10-CM | POA: Insufficient documentation

## 2014-02-12 DIAGNOSIS — S61209A Unspecified open wound of unspecified finger without damage to nail, initial encounter: Secondary | ICD-10-CM | POA: Insufficient documentation

## 2014-02-12 DIAGNOSIS — W261XXA Contact with sword or dagger, initial encounter: Secondary | ICD-10-CM

## 2014-02-12 DIAGNOSIS — Z79899 Other long term (current) drug therapy: Secondary | ICD-10-CM | POA: Insufficient documentation

## 2014-02-12 DIAGNOSIS — Y929 Unspecified place or not applicable: Secondary | ICD-10-CM | POA: Insufficient documentation

## 2014-02-12 DIAGNOSIS — K219 Gastro-esophageal reflux disease without esophagitis: Secondary | ICD-10-CM | POA: Insufficient documentation

## 2014-02-12 DIAGNOSIS — Z872 Personal history of diseases of the skin and subcutaneous tissue: Secondary | ICD-10-CM | POA: Insufficient documentation

## 2014-02-12 DIAGNOSIS — Y9389 Activity, other specified: Secondary | ICD-10-CM | POA: Insufficient documentation

## 2014-02-12 MED ORDER — TETANUS-DIPHTH-ACELL PERTUSSIS 5-2.5-18.5 LF-MCG/0.5 IM SUSP
0.5000 mL | Freq: Once | INTRAMUSCULAR | Status: AC
  Administered 2014-02-12: 0.5 mL via INTRAMUSCULAR
  Filled 2014-02-12: qty 0.5

## 2014-02-12 MED ORDER — TRAMADOL HCL 50 MG PO TABS: 50.0000 mg | ORAL_TABLET | Freq: Three times a day (TID) | ORAL | Status: DC | PRN

## 2014-02-12 NOTE — ED Provider Notes (Signed)
CSN: [id number]     Arrival date & time 02/12/14  1806 History   This chart was scribed for a non-physician practitioner, Antony MaduraKelly Taijon Vink, PA-C, working with Toy CookeyMegan Docherty, MD by Julian HyMorgan Graham, ED Scribe. The patient was seen in [facility]. The patient's care was started at 8:05 PM.  Chief Complaint  Patient presents with  . finger laceration    The history is provided by the patient. No language interpreter was used.   HPI Comments: Brandon Clark is a 73 y.o. male who presents to the Emergency Department complaining of new, moderate left ring finger laceration onset 4 hours ago. Pt reports associated left finger pain that he describes as throbbing. Pt reports he is able to move his finger. Pt states he was chopping a tomato when he injured his finger. Pt reports he wrapped his finger with tape to stop the bleeding and his bleeding is now well-controlled. Pt denies being on blood thinners. Pt denies numbness, weakness, fever, chills, nausea, or vomiting.  Pt is unable to specify when he recieved his last tetanus shot.  Past Medical History  Diagnosis Date  . Acid reflux   . Ulcer    History reviewed. No pertinent past surgical history. Family History  Problem Relation Age of Onset  . Diabetes Mother   . Diabetes Brother   . Heart attack Neg Hx   . Hyperlipidemia Neg Hx   . Hypertension Neg Hx   . Sudden death Neg Hx    History  Substance Use Topics  . Smoking status: Never Smoker   . Smokeless tobacco: Not on file  . Alcohol Use: No    Review of Systems  Constitutional: Negative for fever and chills.  Gastrointestinal: Negative for nausea and vomiting.  Musculoskeletal: Positive for arthralgias (left ring finger).  Skin: Positive for wound (left ring finger).  Neurological: Negative for weakness and numbness.  All other systems reviewed and are negative.   Allergies  Review of patient's allergies indicates no known allergies.  Home Medications   Prior to Admission  medications   Medication Sig Start Date End Date Taking? Authorizing Provider  esomeprazole (NEXIUM) 40 MG capsule Take 1 capsule (40 mg total) by mouth daily. 01/21/13   Richarda OverlieNayana Abrol, MD  nitroGLYCERIN (NITRODUR - DOSED IN MG/24 HR) 0.2 mg/hr patch 1/4th patch over affected shoulder, change daily 12/06/13   Lenda KelpShane R Hudnall, MD  omeprazole (PRILOSEC) 40 MG capsule Take 1 capsule (40 mg total) by mouth daily. 02/20/13   Shanker Levora DredgeM Ghimire, MD  pantoprazole (PROTONIX) 40 MG tablet Take 1 tablet (40 mg total) by mouth daily. 01/09/14   Ambrose FinlandValerie A Keck, NP  traMADol (ULTRAM) 50 MG tablet Take 1 tablet (50 mg total) by mouth every 8 (eight) hours as needed for moderate pain or severe pain. 02/12/14   Antony MaduraKelly Huie Ghuman, PA-C   Triage Vitals: BP 144/107  Pulse 86  Temp(Src) 98.3 F (36.8 C) (Oral)  Resp 18  SpO2 97% Physical Exam  Nursing note and vitals reviewed. Constitutional: He is oriented to person, place, and time. He appears well-developed and well-nourished. No distress.  HENT:  Head: Normocephalic and atraumatic.  Eyes: Conjunctivae and EOM are normal. No scleral icterus.  Neck: Normal range of motion.  Cardiovascular: Normal rate, regular rhythm and intact distal pulses.   Distal radial pulse 2+ and left upper extremity. Capillary refill brisk in all digits.  Pulmonary/Chest: Effort normal. No respiratory distress.  Musculoskeletal: Normal range of motion.  Neurological: He is alert and  oriented to person, place, and time. He exhibits normal muscle tone. Coordination normal.  No gross sensory deficits appreciated. Patient able to wiggle all fingers. Finger to thumb opposition intact.  Skin: Skin is warm and dry. No rash noted. He is not diaphoretic. No erythema. No pallor.  1 cm straight laceration to the tip of the left ring finger. No active bleeding. Wound has closed on its own with pressure.  Psychiatric: He has a normal mood and affect. His behavior is normal.    ED Course  Procedures  (including critical care time) DIAGNOSTIC STUDIES: Oxygen Saturation is 97% on RA, normal by my interpretation.    COORDINATION OF CARE: 8:10 PM- Will order Boostrix. Patient informed of current plan for treatment and evaluation and agrees with plan at this time.   LACERATION REPAIR Performed by: Antony Madura Authorized by: Antony Madura Consent: Verbal consent obtained. Risks and benefits: risks, benefits and alternatives were discussed Consent given by: patient Patient identity confirmed: provided demographic data Prepped and Draped in normal sterile fashion Wound explored  Laceration Location: L ring finger  Laceration Length: 1cm  No Foreign Bodies seen or palpated  Anesthesia: none  Local anesthetic: none  Anesthetic total: n/a  Irrigation method: syringe Amount of cleaning: standard  Skin closure: Dermabond and Steri Strips  Number of sutures: 0  Technique: simple  Patient tolerance: Patient tolerated the procedure well with no immediate complications.  MDM   Final diagnoses:  Laceration of left ring finger w/o foreign body w/o damage to nail, initial encounter    74 year old male presents to the emergency department for a laceration to his left ring finger. Patient cut his finger with a knife at 4 PM today. Tdap booster given. Laceration occurred < 8 hours prior to repair which was well tolerated. Pt has no comorbidities to effect normal wound healing. Discussed suture home care with patient and answered questions. Patient to follow up for wound check with his PCP as needed. Return precautions provided and patient able to plan with no unaddressed concerns.  I personally performed the services described in this documentation, which was scribed in my presence. The recorded information has been reviewed and is accurate.   Antony Madura, PA-C 02/12/14 2147

## 2014-02-12 NOTE — ED Provider Notes (Signed)
Medical screening examination/treatment/procedure(s) were performed by non-physician practitioner and as supervising physician I was immediately available for consultation/collaboration.   Megan Docherty, MD 02/12/14 2358 

## 2014-02-12 NOTE — Discharge Instructions (Signed)
Keep your wound clean and dry. Recommend you keep the area covered to prevent your wound from opening. Follow up with your doctor as needed or if signs of infection develop. Take Tramadol as needed for pain. Your Dermabond glue will dissolve on its own. Do not soak your hands in water for long periods of time.  Laceration Care, Adult A laceration is a cut or lesion that goes through all layers of the skin and into the tissue just beneath the skin. TREATMENT  Some lacerations may not require closure. Some lacerations may not be able to be closed due to an increased risk of infection. It is important to see your caregiver as soon as possible after an injury to minimize the risk of infection and maximize the opportunity for successful closure. If closure is appropriate, pain medicines may be given, if needed. The wound will be cleaned to help prevent infection. Your caregiver will use stitches (sutures), staples, wound glue (adhesive), or skin adhesive strips to repair the laceration. These tools bring the skin edges together to allow for faster healing and a better cosmetic outcome. However, all wounds will heal with a scar. Once the wound has healed, scarring can be minimized by covering the wound with sunscreen during the day for 1 full year. HOME CARE INSTRUCTIONS  For sutures or staples:  Keep the wound clean and dry.  If you were given a bandage (dressing), you should change it at least once a day. Also, change the dressing if it becomes wet or dirty, or as directed by your caregiver.  Wash the wound with soap and water 2 times a day. Rinse the wound off with water to remove all soap. Pat the wound dry with a clean towel.  After cleaning, apply a thin layer of the antibiotic ointment as recommended by your caregiver. This will help prevent infection and keep the dressing from sticking.  You may shower as usual after the first 24 hours. Do not soak the wound in water until the sutures are  removed.  Only take over-the-counter or prescription medicines for pain, discomfort, or fever as directed by your caregiver.  Get your sutures or staples removed as directed by your caregiver. For skin adhesive strips:  Keep the wound clean and dry.  Do not get the skin adhesive strips wet. You may bathe carefully, using caution to keep the wound dry.  If the wound gets wet, pat it dry with a clean towel.  Skin adhesive strips will fall off on their own. You may trim the strips as the wound heals. Do not remove skin adhesive strips that are still stuck to the wound. They will fall off in time. For wound adhesive:  You may briefly wet your wound in the shower or bath. Do not soak or scrub the wound. Do not swim. Avoid periods of heavy perspiration until the skin adhesive has fallen off on its own. After showering or bathing, gently pat the wound dry with a clean towel.  Do not apply liquid medicine, cream medicine, or ointment medicine to your wound while the skin adhesive is in place. This may loosen the film before your wound is healed.  If a dressing is placed over the wound, be careful not to apply tape directly over the skin adhesive. This may cause the adhesive to be pulled off before the wound is healed.  Avoid prolonged exposure to sunlight or tanning lamps while the skin adhesive is in place. Exposure to ultraviolet light in the  first year will darken the scar.  The skin adhesive will usually remain in place for 5 to 10 days, then naturally fall off the skin. Do not pick at the adhesive film. You may need a tetanus shot if:  You cannot remember when you had your last tetanus shot.  You have never had a tetanus shot. If you get a tetanus shot, your arm may swell, get red, and feel warm to the touch. This is common and not a problem. If you need a tetanus shot and you choose not to have one, there is a rare chance of getting tetanus. Sickness from tetanus can be serious. SEEK  MEDICAL CARE IF:   You have redness, swelling, or increasing pain in the wound.  You see a red line that goes away from the wound.  You have yellowish-white fluid (pus) coming from the wound.  You have a fever.  You notice a bad smell coming from the wound or dressing.  Your wound breaks open before or after sutures have been removed.  You notice something coming out of the wound such as wood or glass.  Your wound is on your hand or foot and you cannot move a finger or toe. SEEK IMMEDIATE MEDICAL CARE IF:   Your pain is not controlled with prescribed medicine.  You have severe swelling around the wound causing pain and numbness or a change in color in your arm, hand, leg, or foot.  Your wound splits open and starts bleeding.  You have worsening numbness, weakness, or loss of function of any joint around or beyond the wound.  You develop painful lumps near the wound or on the skin anywhere on your body. MAKE SURE YOU:   Understand these instructions.  Will watch your condition.  Will get help right away if you are not doing well or get worse. Document Released: 06/20/2005 Document Revised: 09/12/2011 Document Reviewed: 12/14/2010 [facility] Patient Information 2015 [place], [place]. This information is not intended to replace advice given to you by your health care provider. Make sure you discuss any questions you have with your health care provider.

## 2014-02-12 NOTE — ED Notes (Signed)
Per pt, cut left ring finger with knife-bleeding controlled

## 2014-02-12 NOTE — ED Notes (Signed)
Bed: [facility] Expected date:  Expected time:  Means of arrival:  Comments: Phlebotomy  

## 2014-02-13 ENCOUNTER — Ambulatory Visit: Payer: No Typology Code available for payment source | Attending: Internal Medicine | Admitting: Internal Medicine

## 2014-02-13 ENCOUNTER — Encounter: Payer: Self-pay | Admitting: Internal Medicine

## 2014-02-13 VITALS — BP 153/85 | HR 80 | Temp 97.9°F | Resp 16 | Ht 74.8 in | Wt 245.8 lb

## 2014-02-13 DIAGNOSIS — K219 Gastro-esophageal reflux disease without esophagitis: Secondary | ICD-10-CM | POA: Insufficient documentation

## 2014-02-13 DIAGNOSIS — R109 Unspecified abdominal pain: Secondary | ICD-10-CM

## 2014-02-13 DIAGNOSIS — E785 Hyperlipidemia, unspecified: Secondary | ICD-10-CM | POA: Insufficient documentation

## 2014-02-13 DIAGNOSIS — Z79899 Other long term (current) drug therapy: Secondary | ICD-10-CM | POA: Insufficient documentation

## 2014-02-13 DIAGNOSIS — I10 Essential (primary) hypertension: Secondary | ICD-10-CM

## 2014-02-13 MED ORDER — SIMVASTATIN 10 MG PO TABS: 10.0000 mg | ORAL_TABLET | Freq: Every day | ORAL | Status: DC

## 2014-02-13 MED ORDER — HYDROCHLOROTHIAZIDE 12.5 MG PO TABS: 12.5000 mg | ORAL_TABLET | Freq: Every day | ORAL | Status: DC

## 2014-02-13 NOTE — Progress Notes (Signed)
Patient ID: Brandon Clark, male   DOB: 05/15/1941, 73 y.o.   MRN: [id number]  CC:  Abdominal pain  HPI:  Patient presents today for c/o abdominal pain. He has the same complaints of vomiting blood, pain, and bloating. He states that he has not noticed bloody emesis in over one month now, he just feels like the bloating is becoming worse. He denies constipation and acid reflux. He was referred to GI on last exam but has not had an appointment yet [place].  He reports that he has been having headaches for the past two months and been feeling dizzy.  He has noticed some vision changes as well.   No Known Allergies Past Medical History  Diagnosis Date  . Acid reflux   . Ulcer    Current Outpatient Prescriptions on File Prior to Visit  Medication Sig Dispense Refill  . nitroGLYCERIN (NITRODUR - DOSED IN MG/24 HR) 0.2 mg/hr patch 1/4th patch over affected shoulder, change daily  30 patch  1  . pantoprazole (PROTONIX) 40 MG tablet Take 1 tablet (40 mg total) by mouth daily.  30 tablet  3  . esomeprazole (NEXIUM) 40 MG capsule Take 1 capsule (40 mg total) by mouth daily.  30 capsule  3  . omeprazole (PRILOSEC) 40 MG capsule Take 1 capsule (40 mg total) by mouth daily.  30 capsule  3  . traMADol (ULTRAM) 50 MG tablet Take 1 tablet (50 mg total) by mouth every 8 (eight) hours as needed for moderate pain or severe pain.  7 tablet  0   No current facility-administered medications on file prior to visit.   Family History  Problem Relation Age of Onset  . Diabetes Mother   . Diabetes Brother   . Heart attack Neg Hx   . Hyperlipidemia Neg Hx   . Hypertension Neg Hx   . Sudden death Neg Hx    History   Social History  . Marital Status: Married    Spouse Name: N/A    Number of Children: N/A  . Years of Education: N/A   Occupational History  . Not on file.   Social History Main Topics  . Smoking status: Never Smoker   . Smokeless tobacco: Not on file  .  Alcohol Use: No  . Drug Use: No  . Sexual Activity: No   Other Topics Concern  . Not on file   Social History Narrative  . No narrative on file    Review of Systems: See HPI  Objective:   Filed Vitals:   02/13/14 1052  BP: 153/85  Pulse: 80  Temp: 97.9 F (36.6 C)  Resp: 16    Physical Exam  Constitutional: He is oriented to person, place, and time.  Cardiovascular: Normal rate, normal heart sounds and intact distal pulses.   Pulmonary/Chest: Effort normal and breath sounds normal.  Abdominal: Bowel sounds are normal. He exhibits distension.  Musculoskeletal: Normal range of motion. He exhibits no edema and no tenderness.  Neurological: He is alert and oriented to person, place, and time. He has normal reflexes.  Skin: Skin is warm and dry.   .  Lab Results  Component Value Date   WBC 4.8 01/09/2014   HGB 15.6 01/09/2014   HCT 44.9 01/09/2014   MCV 86.0 01/09/2014   PLT 224 01/09/2014   Lab Results  Component Value Date   CREATININE 1.02 01/21/2013   BUN 17 01/21/2013   NA 137 01/21/2013  K 4.9 01/21/2013   CL 101 01/21/2013   CO2 29 01/21/2013    Lab Results  Component Value Date   HGBA1C 5.6 01/21/2013   Lipid Panel     Component Value Date/Time   CHOL 210* 01/21/2013 1229   TRIG 94 01/21/2013 1229   HDL 46 01/21/2013 1229   CHOLHDL 4.6 01/21/2013 1229   VLDL 19 01/21/2013 1229   LDLCALC 145* 01/21/2013 1229       Assessment and plan:   Cloy was seen today for abdominal pain.  Diagnoses and associated orders for this visit:  Essential hypertension - Begin hydrochlorothiazide (HYDRODIURIL) 12.5 MG tablet; Take 1 tablet (12.5 mg total) by mouth daily.  Abdominal pain, unspecified site Keep GI appt HLD (hyperlipidemia) - Begin simvastatin (ZOCOR) 10 MG tablet; Take 1 tablet (10 mg total) by mouth at bedtime.   Return in about 1 week (around 02/20/2014) for Nurse Visit-BP check.       Holland Commons, NP-C [facility] [phone] 02/13/2014, 11:19 AM

## 2014-02-13 NOTE — Progress Notes (Signed)
abd pain, vomiting blood 1 month ago. Was in the ER yesterday due to a cut in 2 lt finger Unable to sleep  High blood pressure

## 2014-02-13 NOTE — Patient Instructions (Signed)
DASH Eating Plan °DASH stands for "Dietary Approaches to Stop Hypertension." The DASH eating plan is a healthy eating plan that has been shown to reduce high blood pressure (hypertension). Additional health benefits may include reducing the risk of type 2 diabetes mellitus, heart disease, and stroke. The DASH eating plan may also help with weight loss. °WHAT DO I NEED TO KNOW ABOUT THE DASH EATING PLAN? °For the DASH eating plan, you will follow these general guidelines: °· Choose foods with a percent daily value for sodium of less than 5% (as listed on the food label). °· Use salt-free seasonings or herbs instead of table salt or sea salt. °· Check with your health care provider or pharmacist before using salt substitutes. °· Eat lower-sodium products, often labeled as "lower sodium" or "no salt added." °· Eat fresh foods. °· Eat more vegetables, fruits, and low-fat dairy products. °· Choose whole grains. Look for the word "whole" as the first word in the ingredient list. °· Choose fish and skinless chicken or turkey more often than red meat. Limit fish, poultry, and meat to 6 oz (170 g) each day. °· Limit sweets, desserts, sugars, and sugary drinks. °· Choose heart-healthy fats. °· Limit cheese to 1 oz (28 g) per day. °· Eat more home-cooked food and less restaurant, buffet, and fast food. °· Limit fried foods. °· Cook foods using methods other than frying. °· Limit canned vegetables. If you do use them, rinse them well to decrease the sodium. °· When eating at a restaurant, ask that your food be prepared with less salt, or no salt if possible. °WHAT FOODS CAN I EAT? °Seek help from a dietitian for individual calorie needs. °Grains °Whole grain or whole wheat bread. Brown rice. Whole grain or whole wheat pasta. Quinoa, bulgur, and whole grain cereals. Low-sodium cereals. Corn or whole wheat flour tortillas. Whole grain cornbread. Whole grain crackers. Low-sodium crackers. °Vegetables °Fresh or frozen vegetables  (raw, steamed, roasted, or grilled). Low-sodium or reduced-sodium tomato and vegetable juices. Low-sodium or reduced-sodium tomato sauce and paste. Low-sodium or reduced-sodium canned vegetables.  °Fruits °All fresh, canned (in natural juice), or frozen fruits. °Meat and Other Protein Products °Ground beef (85% or leaner), grass-fed beef, or beef trimmed of fat. Skinless chicken or turkey. Ground chicken or turkey. Pork trimmed of fat. All fish and seafood. Eggs. Dried beans, peas, or lentils. Unsalted nuts and seeds. Unsalted canned beans. °Dairy °Low-fat dairy products, such as skim or 1% milk, 2% or reduced-fat cheeses, low-fat ricotta or cottage cheese, or plain low-fat yogurt. Low-sodium or reduced-sodium cheeses. °Fats and Oils °Tub margarines without trans fats. Light or reduced-fat mayonnaise and salad dressings (reduced sodium). Avocado. Safflower, olive, or canola oils. Natural peanut or almond butter. °Other °Unsalted popcorn and pretzels. °The items listed above may not be a complete list of recommended foods or beverages. Contact your dietitian for more options. °WHAT FOODS ARE NOT RECOMMENDED? °Grains °White bread. White pasta. White rice. Refined cornbread. Bagels and croissants. Crackers that contain trans fat. °Vegetables °Creamed or fried vegetables. Vegetables in a cheese sauce. Regular canned vegetables. Regular canned tomato sauce and paste. Regular tomato and vegetable juices. °Fruits °Dried fruits. Canned fruit in light or heavy syrup. Fruit juice. °Meat and Other Protein Products °Fatty cuts of meat. Ribs, chicken wings, bacon, sausage, bologna, salami, chitterlings, fatback, hot dogs, bratwurst, and packaged luncheon meats. Salted nuts and seeds. Canned beans with salt. °Dairy °Whole or 2% milk, cream, half-and-half, and cream cheese. Whole-fat or sweetened yogurt. Full-fat   cheeses or blue cheese. Nondairy creamers and whipped toppings. Processed cheese, cheese spreads, or cheese  curds. °Condiments °Onion and garlic salt, seasoned salt, table salt, and sea salt. Canned and packaged gravies. Worcestershire sauce. Tartar sauce. Barbecue sauce. Teriyaki sauce. Soy sauce, including reduced sodium. Steak sauce. Fish sauce. Oyster sauce. Cocktail sauce. Horseradish. Ketchup and mustard. Meat flavorings and tenderizers. Bouillon cubes. Hot sauce. Tabasco sauce. Marinades. Taco seasonings. Relishes. °Fats and Oils °Butter, stick margarine, lard, shortening, ghee, and bacon fat. Coconut, palm kernel, or palm oils. Regular salad dressings. °Other °Pickles and olives. Salted popcorn and pretzels. °The items listed above may not be a complete list of foods and beverages to avoid. Contact your dietitian for more information. °WHERE CAN I FIND MORE INFORMATION? °National [employer], [employer], [employer] Institute: www.nhlbi.nih.gov/health/health-topics/topics/dash/ °Document Released: 06/09/2011 Document Revised: 11/04/2013 Document Reviewed: 04/24/2013 °[employer]® Patient Information ©2015 [employer]. This information is not intended to replace advice given to you by your health care provider. Make sure you discuss any questions you have with your health care provider. ° °

## 2014-02-21 ENCOUNTER — Ambulatory Visit (INDEPENDENT_AMBULATORY_CARE_PROVIDER_SITE_OTHER): Payer: No Typology Code available for payment source | Admitting: Family Medicine

## 2014-02-21 ENCOUNTER — Encounter: Payer: Self-pay | Admitting: Family Medicine

## 2014-02-21 VITALS — BP 129/90 | Ht 73.0 in | Wt 245.0 lb

## 2014-02-21 DIAGNOSIS — M25511 Pain in right shoulder: Secondary | ICD-10-CM

## 2014-02-21 DIAGNOSIS — M25512 Pain in left shoulder: Secondary | ICD-10-CM

## 2014-02-21 DIAGNOSIS — M25561 Pain in right knee: Secondary | ICD-10-CM

## 2014-02-21 DIAGNOSIS — M25569 Pain in unspecified knee: Secondary | ICD-10-CM

## 2014-02-21 DIAGNOSIS — M25562 Pain in left knee: Principal | ICD-10-CM

## 2014-02-21 DIAGNOSIS — M25519 Pain in unspecified shoulder: Secondary | ICD-10-CM

## 2014-02-21 MED ORDER — DICLOFENAC SODIUM 75 MG PO TBEC: 75.0000 mg | DELAYED_RELEASE_TABLET | Freq: Two times a day (BID) | ORAL | Status: DC

## 2014-02-21 MED ORDER — NITROGLYCERIN 0.2 MG/HR TD PT24: MEDICATED_PATCH | TRANSDERMAL | Status: DC

## 2014-02-21 NOTE — Patient Instructions (Signed)
Take voltaren twice a day with food as needed for pain and inflammation. Continue using the nitro patches for another 6 weeks. Follow up with me in 6 weeks. Things to consider: physical therapy for shoulders and knees; MRI for knee.

## 2014-02-24 ENCOUNTER — Encounter: Payer: Self-pay | Admitting: Family Medicine

## 2014-02-24 NOTE — Progress Notes (Signed)
Patient ID: Brandon Clark, male   DOB: 05-13-1941, 73 y.o.   MRN: [id number]  PCP: Jeanann Lewandowsky, MD  Subjective:   HPI: Patient is a 73 y.o. male here for f/u left knee pain, left shoulder pain.  8/4: Patient denies known injury. Reports about 5 years of worsening pain in both knees, worse in left knee. No prior problems with knees. Worse with bending down. Pain used to come and go but is more consistent now. Difficult getting up from seated position. Tried tylenol, tramadol, sports cream. + swelling. No catching, locking.  8/29: Patient reports he's over 50% better now since the injections. Some pain especially in left knee. Swelling as well more on left side. Taking osteobiflex and tylenol as needed.  09/13/13: Patient returns for bilateral knee pain. Injections helped quite a bit for at least 3 months. Still getting some pain in knees. Left one swelling. On Saturday felt like knee was giving out. No longer taking osteobiflex. Not icing or bracing. Also having soreness in both shoulders without history of injury.  4/24: Patient returns with a lot of swelling, worsening pain in left knee for past week. Difficult to walk as a result, is limping. Left shoulder bothering him though right shoulder has improved. Taking advil, icing both knee and shoulder. Doing home exercises we reviewed also.  6/5: Patient reports he feels a little bit better than last visit - still having left knee and shoulder pain. Doing home exercises for both. Takes advil, ices as needed. Injections provided some relief but not complete. Walks with a mild limp.  8/21: Patient reports mild improvement with both knees and both shoulders. Doing home exercises. Taking advil, icing. Using nitro patches - 1/2 patch on each shoulder - no side effects. Working out in a gym as well. No catching, locking, giving out of knees.  Past Medical History  Diagnosis Date  . Acid reflux   .  Ulcer     Current Outpatient Prescriptions on File Prior to Visit  Medication Sig Dispense Refill  . esomeprazole (NEXIUM) 40 MG capsule Take 1 capsule (40 mg total) by mouth daily.  30 capsule  3  . hydrochlorothiazide (HYDRODIURIL) 12.5 MG tablet Take 1 tablet (12.5 mg total) by mouth daily.  30 tablet  1  . pantoprazole (PROTONIX) 40 MG tablet Take 1 tablet (40 mg total) by mouth daily.  30 tablet  3  . simvastatin (ZOCOR) 10 MG tablet Take 1 tablet (10 mg total) by mouth at bedtime.  90 tablet  3   No current facility-administered medications on file prior to visit.    History reviewed. No pertinent past surgical history.  No Known Allergies  History   Social History  . Marital Status: Married    Spouse Name: N/A    Number of Children: N/A  . Years of Education: N/A   Occupational History  . Not on file.   Social History Main Topics  . Smoking status: Never Smoker   . Smokeless tobacco: Not on file  . Alcohol Use: No  . Drug Use: No  . Sexual Activity: No   Other Topics Concern  . Not on file   Social History Narrative  . No narrative on file    Family History  Problem Relation Age of Onset  . Diabetes Mother   . Diabetes Brother   . Heart attack Neg Hx   . Hyperlipidemia Neg Hx   . Hypertension Neg Hx   . Sudden death Neg Hx  BP 129/90  Ht  (1.854 m)  Wt 245 lb (111.131 kg)  BMI 32.33 kg/m2  Review of Systems: See HPI above.    Objective:  Physical Exam:  Gen: NAD  Bilateral knees: Mild effusion left, none on right.  No other gross deformity, ecchymoses. Mild TTP medial joint line.  No lateral joint line, post patellar facet TTP. FROM. Negative ant/post drawers. Negative valgus/varus testing. Negative lachmanns. Negative mcmurrays, apleys, patellar apprehension. NV intact distally.  Bilateral shoulders: No swelling, ecchymoses.  No gross deformity. No TTP. FROM with painful arc. Positive Hawkins, Neers. Negative Speeds,  Yergasons. Strength 5/5 with empty can and resisted internal/external rotation.  Pain empty can. Negative apprehension. NV intact distally.    Assessment & Plan:  1. 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  Would not repeat another aspiration/injection as hasn't seemed to help him that much.  Again discussed MRI - he's not interested in this at this time.  Switch to voltaren instead of advil to try this.  Consider physical therapy as well.  2. Bilateral shoulder pain - 2/2 rotator cuff impingement.  Continue home exercise program.  Subacromial injection for left shoulder with only mild relief.  Improving some with nitro patches - continue these.  Consider physical therapy in future.  F/u in 6 weeks.

## 2014-02-24 NOTE — Assessment & Plan Note (Signed)
2/2 rotator cuff impingement.  Continue home exercise program.  Subacromial injection for left shoulder with only mild relief.  Improving some with nitro patches - continue these.  Consider physical therapy in future.  F/u in 6 weeks.

## 2014-02-24 NOTE — Assessment & Plan Note (Signed)
2/2 DJD, possible degenerative meniscus tear.  Would not repeat another aspiration/injection as hasn't seemed to help him that much.  Again discussed MRI - he's not interested in this at this time.  Switch to voltaren instead of advil to try this.  Consider physical therapy as well.

## 2014-03-02 ENCOUNTER — Ambulatory Visit (HOSPITAL_BASED_OUTPATIENT_CLINIC_OR_DEPARTMENT_OTHER): Payer: No Typology Code available for payment source | Attending: Internal Medicine | Admitting: Radiology

## 2014-03-02 DIAGNOSIS — R0683 Snoring: Secondary | ICD-10-CM

## 2014-03-02 DIAGNOSIS — G4733 Obstructive sleep apnea (adult) (pediatric): Secondary | ICD-10-CM | POA: Insufficient documentation

## 2014-03-03 ENCOUNTER — Ambulatory Visit: Payer: No Typology Code available for payment source | Attending: Internal Medicine | Admitting: *Deleted

## 2014-03-03 VITALS — BP 129/93 | HR 80 | Resp 16

## 2014-03-03 DIAGNOSIS — I10 Essential (primary) hypertension: Secondary | ICD-10-CM

## 2014-03-03 NOTE — Patient Instructions (Signed)
Hydrochlorothiazide, HCTZ capsules or tablets What is this medicine? HYDROCHLOROTHIAZIDE (hye droe klor oh THYE a zide) is a diuretic. It increases the amount of urine passed, which causes the body to lose salt and water. This medicine is used to treat high blood pressure. It is also reduces the swelling and water retention caused by various medical conditions, such as heart, liver, or kidney disease. This medicine may be used for other purposes; ask your health care provider or pharmacist if you have questions. COMMON BRAND NAME(S): Esidrix, Ezide, HydroDIURIL, Microzide, Oretic, Zide What should I tell my health care provider before I take this medicine? They need to know if you have any of these conditions: -diabetes -gout -immune system problems, like lupus -kidney disease or kidney stones -liver disease -pancreatitis -small amount of urine or difficulty passing urine -an unusual or allergic reaction to hydrochlorothiazide, sulfa drugs, other medicines, foods, dyes, or preservatives -pregnant or trying to get pregnant -breast-feeding How should I use this medicine? Take this medicine by mouth with a glass of water. Follow the directions on the prescription label. Take your medicine at regular intervals. Remember that you will need to pass urine frequently after taking this medicine. Do not take your doses at a time of day that will cause you problems. Do not stop taking your medicine unless your doctor tells you to. Talk to your pediatrician regarding the use of this medicine in children. Special care may be needed. Overdosage: If you think you have taken too much of this medicine contact a poison control center or emergency room at once. NOTE: This medicine is only for you. Do not share this medicine with others. What if I miss a dose? If you miss a dose, take it as soon as you can. If it is almost time for your next dose, take only that dose. Do not take double or extra doses. What may  interact with this medicine? -cholestyramine -colestipol -digoxin -dofetilide -lithium -medicines for blood pressure -medicines for diabetes -medicines that relax muscles for surgery -other diuretics -steroid medicines like prednisone or cortisone This list may not describe all possible interactions. Give your health care provider a list of all the medicines, herbs, non-prescription drugs, or dietary supplements you use. Also tell them if you smoke, drink alcohol, or use illegal drugs. Some items may interact with your medicine. What should I watch for while using this medicine? Visit your doctor or health care professional for regular checks on your progress. Check your blood pressure as directed. Ask your doctor or health care professional what your blood pressure should be and when you should contact him or her. You may need to be on a special diet while taking this medicine. Ask your doctor. Check with your doctor or health care professional if you get an attack of severe diarrhea, nausea and vomiting, or if you sweat a lot. The loss of too much body fluid can make it dangerous for you to take this medicine. You may get drowsy or dizzy. Do not drive, use machinery, or do anything that needs mental alertness until you know how this medicine affects you. Do not stand or sit up quickly, especially if you are an older patient. This reduces the risk of dizzy or fainting spells. Alcohol may interfere with the effect of this medicine. Avoid alcoholic drinks. This medicine may affect your blood sugar level. If you have diabetes, check with your doctor or health care professional before changing the dose of your diabetic medicine. This medicine   can make you more sensitive to the sun. Keep out of the sun. If you cannot avoid being in the sun, wear protective clothing and use sunscreen. Do not use sun lamps or tanning beds/booths. What side effects may I notice from receiving this medicine? Side effects  that you should report to your doctor or health care professional as soon as possible: -allergic reactions such as skin rash or itching, hives, swelling of the lips, mouth, tongue, or throat -changes in vision -chest pain -eye pain -fast or irregular heartbeat -feeling faint or lightheaded, falls -gout attack -muscle pain or cramps -pain or difficulty when passing urine -pain, tingling, numbness in the hands or feet -redness, blistering, peeling or loosening of the skin, including inside the mouth -unusually weak or tired Side effects that usually do not require medical attention (report to your doctor or health care professional if they continue or are bothersome): -change in sex drive or performance -dry mouth -headache -stomach upset This list may not describe all possible side effects. Call your doctor for medical advice about side effects. You may report side effects to FDA at 1-800-FDA-1088. Where should I keep my medicine? Keep out of the reach of children. Store at room temperature between 15 and 30 degrees C (59 and 86 degrees F). Do not freeze. Protect from light and moisture. Keep container closed tightly. Throw away any unused medicine after the expiration date. NOTE: This sheet is a summary. It may not cover all possible information. If you have questions about this medicine, talk to your doctor, pharmacist, or health care provider.  2015, Elsevier/Gold Standard. (2010-02-12 12:57:37)   

## 2014-03-03 NOTE — Progress Notes (Signed)
Patient here for BP check. Patient was placed on HCTZ on 02/13/2014.

## 2014-03-08 DIAGNOSIS — R0989 Other specified symptoms and signs involving the circulatory and respiratory systems: Secondary | ICD-10-CM

## 2014-03-08 DIAGNOSIS — R0609 Other forms of dyspnea: Secondary | ICD-10-CM

## 2014-03-08 NOTE — Sleep Study (Signed)
   NAME: Brandon Clark DATE OF BIRTH:  04-06-1941 MEDICAL RECORD NUMBER [id number]  LOCATION: [facility]  PHYSICIAN: YOUNG,CLINTON D  DATE OF STUDY: 03/02/2014  SLEEP STUDY TYPE: Nocturnal Polysomnogram               REFERRING PHYSICIAN: Ambrose Finland, NP  INDICATION FOR STUDY: Hypersomnia with sleep apnea  EPWORTH SLEEPINESS SCORE:   16/24 HEIGHT:   6 feet 4 inches  WEIGHT:   240 pounds   BMI 29 NECK SIZE: 16 in.  MEDICATIONS: Charted for review  SLEEP ARCHITECTURE: Total sleep time 357 minutes with sleep efficiency 81.5%. Stage I was 36.4%, stage II 54.6%, stage III absent, REM 9% of total sleep time. Sleep latency 0 minutes, REM latency 279 minutes, awake after sleep onset 81 minutes, arousal index 63.7, bedtime medication: None  RESPIRATORY DATA: Apnea hypopneas index (AHI) 68.7 per hour. 409 total events scored including 291 obstructive apneas, 2 central apneas, 107 mixed apneas, 9 hypopneas. All events were while sleeping supine. REM AHI 52.5 per hour. This study was ordered as a diagnostic polysomnogram, CPAP titration was not done.  OXYGEN DATA: Moderately loud snoring with oxygen desaturation to a nadir of 71% and mean oxygen saturation 90.1% on room air.  CARDIAC DATA: Sinus rhythm with PACs  MOVEMENT/PARASOMNIA: No significant motor disturbance, bathroom x1  IMPRESSION/ RECOMMENDATION:   1) Severe obstructive sleep apnea/hypopnea syndrome, AHI 68.7 per hour with supine he events. REM AHI 52.5 per hour. Moderately loud snoring with oxygen desaturation to a nadir of 71% and mean saturation 90.1% on room air. 2) This study was ordered as a diagnostic polysomnogram without CPAP titration. The patient can return for a dedicated CPAP titration study if appropriate.  Waymon Budge Diplomate, American Board of Sleep Medicine  ELECTRONICALLY SIGNED ON:  03/08/2014, 9:52 AM [facility] PH: [phone]   FX: [phone] ACCREDITED BY THE AMERICAN ACADEMY OF SLEEP MEDICINE

## 2014-04-03 ENCOUNTER — Encounter: Payer: Self-pay | Admitting: Gastroenterology

## 2014-04-03 ENCOUNTER — Ambulatory Visit (INDEPENDENT_AMBULATORY_CARE_PROVIDER_SITE_OTHER): Payer: Self-pay | Admitting: Gastroenterology

## 2014-04-03 VITALS — BP 138/80 | HR 76 | Ht 71.5 in | Wt 247.2 lb

## 2014-04-03 DIAGNOSIS — R1013 Epigastric pain: Secondary | ICD-10-CM

## 2014-04-03 DIAGNOSIS — Z1212 Encounter for screening for malignant neoplasm of rectum: Secondary | ICD-10-CM

## 2014-04-03 DIAGNOSIS — Z1211 Encounter for screening for malignant neoplasm of colon: Secondary | ICD-10-CM

## 2014-04-03 MED ORDER — NA SULFATE-K SULFATE-MG SULF 17.5-3.13-1.6 GM/177ML PO SOLN: 1.0000 | Freq: Once | ORAL | Status: DC

## 2014-04-03 NOTE — Assessment & Plan Note (Signed)
Patient will be scheduled for screening colonoscopy 

## 2014-04-03 NOTE — Progress Notes (Signed)
_                                                                                                                History of Present Illness:  Mr.  Brandon Clark is a 73 year old [place] male referred for evaluation of abdominal pain.  History was obtained through a translator.  He's had several months of postprandial abdominal fullness and bloating.  He has vomited intermittently and claims to vomit dark material and seen blood.  There has been no change in his bowel habits.  He takes voltarem twice a day.  He's also taking Protonix.  He denies pyrosis or abdominal pain, per se.  There has been no change in his bowel habits and no history of melena or hematochezia.   Past Medical History  Diagnosis Date  . Acid reflux   . Ulcer   . Sleep apnea    History reviewed. No pertinent past surgical history. family history includes Diabetes in his brother and mother. There is no history of Heart attack, Hyperlipidemia, Hypertension, or Sudden death. Current Outpatient Prescriptions  Medication Sig Dispense Refill  . diclofenac (VOLTAREN) 75 MG EC tablet Take 1 tablet (75 mg total) by mouth 2 (two) times daily.  60 tablet  1  . esomeprazole (NEXIUM) 40 MG capsule Take 1 capsule (40 mg total) by mouth daily.  30 capsule  3  . hydrochlorothiazide (HYDRODIURIL) 12.5 MG tablet Take 1 tablet (12.5 mg total) by mouth daily.  30 tablet  1  . nitroGLYCERIN (NITRODUR - DOSED IN MG/24 HR) 0.2 mg/hr patch 1/4th patch over affected shoulder, change daily  30 patch  1  . pantoprazole (PROTONIX) 40 MG tablet Take 1 tablet (40 mg total) by mouth daily.  30 tablet  3  . simvastatin (ZOCOR) 10 MG tablet Take 1 tablet (10 mg total) by mouth at bedtime.  90 tablet  3   No current facility-administered medications for this visit.   Allergies as of 04/03/2014  . (No Known Allergies)    reports that he has never smoked. He has never used smokeless tobacco. He reports that he does not drink alcohol or use  illicit drugs.   Review of Systems: Pertinent positive and negative review of systems were noted in the above HPI section. All other review of systems were otherwise negative.  Vital signs were reviewed in today's medical record Physical Exam: General: Well developed , well nourished, no acute distress Skin: anicteric Head: Normocephalic and atraumatic Eyes:  sclerae anicteric, EOMI Ears: Normal auditory acuity Mouth: No deformity or lesions Neck: Supple, no masses or thyromegaly Lungs: Clear throughout to auscultation Heart: Regular rate and rhythm; no murmurs, rubs or bruits Abdomen: Soft,  and non distended. No masses, hepatosplenomegaly or hernias noted. Normal Bowel sounds.  There is mild tenderness to palpation in the right upper quadrant and midepigastric areas.  A small reducible umbilical hernia is present Rectal:deferred Musculoskeletal: Symmetrical with no gross deformities  Skin: No lesions on visible extremities Pulses:  Normal pulses noted  Extremities: No clubbing, cyanosis, edema or deformities noted Neurological: Alert oriented x 4, grossly nonfocal Cervical Nodes:  No significant cervical adenopathy Inguinal Nodes: No significant inguinal adenopathy Psychological:  Alert and cooperative. Normal mood and affect  See Assessment and Plan under Problem List

## 2014-04-03 NOTE — Assessment & Plan Note (Signed)
Several month history of postprandial bloating, intermittent nausea and vomiting.  Patient claims to vomit black material and seen small amounts of blood.  Symptoms could be do to NSAID use.  Ulcer or nonulcer dyspepsia are possibilities.  Gastroparesis is less likely.  Recommendations #1 discontinue diclofenac #2 continue Protonix #3 upper endoscopy

## 2014-04-03 NOTE — Patient Instructions (Signed)
You have been scheduled for an endoscopy. Please follow written instructions given to you at your visit today. If you use inhalers (even only as needed), please bring them with you on the day of your procedure. Your physician has requested that you go to www.[email or web address] and enter the access code given to you at your visit today. This web site gives a general overview about your procedure. However, you should still follow specific instructions given to you by our office regarding your preparation for the procedure.  You have been scheduled for a colonoscopy. Please follow written instructions given to you at your visit today.  Please pick up your prep kit at the pharmacy within the next 1-3 days. If you use inhalers (even only as needed), please bring them with you on the day of your procedure. Your physician has requested that you go to www.[email or web address] and enter the access code given to you at your visit today. This web site gives a general overview about your procedure. However, you should still follow specific instructions given to you by our office regarding your preparation for the procedure. 

## 2014-04-04 ENCOUNTER — Ambulatory Visit: Payer: Self-pay | Admitting: Family Medicine

## 2014-04-14 ENCOUNTER — Encounter: Payer: Self-pay | Admitting: Internal Medicine

## 2014-05-06 ENCOUNTER — Telehealth: Payer: Self-pay | Admitting: Gastroenterology

## 2014-05-07 NOTE — Telephone Encounter (Signed)
Called pharmacy told them that patient can come in to pick up Moviprep kit in place of Suprep  And to call for new instructions

## 2014-05-08 ENCOUNTER — Other Ambulatory Visit: Payer: Self-pay | Admitting: Internal Medicine

## 2014-05-08 DIAGNOSIS — K219 Gastro-esophageal reflux disease without esophagitis: Secondary | ICD-10-CM

## 2014-05-08 MED ORDER — PANTOPRAZOLE SODIUM 40 MG PO TBEC: 40.0000 mg | DELAYED_RELEASE_TABLET | Freq: Every day | ORAL | Status: DC

## 2014-05-15 ENCOUNTER — Ambulatory Visit: Payer: No Typology Code available for payment source | Attending: Internal Medicine | Admitting: Internal Medicine

## 2014-05-15 ENCOUNTER — Encounter: Payer: Self-pay | Admitting: Internal Medicine

## 2014-05-15 ENCOUNTER — Ambulatory Visit (HOSPITAL_BASED_OUTPATIENT_CLINIC_OR_DEPARTMENT_OTHER): Payer: No Typology Code available for payment source | Admitting: *Deleted

## 2014-05-15 VITALS — BP 143/102 | HR 89 | Temp 98.5°F | Resp 16 | Ht 72.84 in | Wt 252.0 lb

## 2014-05-15 DIAGNOSIS — K0381 Cracked tooth: Secondary | ICD-10-CM | POA: Insufficient documentation

## 2014-05-15 DIAGNOSIS — K219 Gastro-esophageal reflux disease without esophagitis: Secondary | ICD-10-CM | POA: Insufficient documentation

## 2014-05-15 DIAGNOSIS — Z791 Long term (current) use of non-steroidal anti-inflammatories (NSAID): Secondary | ICD-10-CM | POA: Insufficient documentation

## 2014-05-15 DIAGNOSIS — K029 Dental caries, unspecified: Secondary | ICD-10-CM | POA: Insufficient documentation

## 2014-05-15 DIAGNOSIS — Z23 Encounter for immunization: Secondary | ICD-10-CM | POA: Insufficient documentation

## 2014-05-15 DIAGNOSIS — R0989 Other specified symptoms and signs involving the circulatory and respiratory systems: Secondary | ICD-10-CM

## 2014-05-15 DIAGNOSIS — Z79899 Other long term (current) drug therapy: Secondary | ICD-10-CM | POA: Insufficient documentation

## 2014-05-15 MED ORDER — TRAMADOL HCL 50 MG PO TABS: 50.0000 mg | ORAL_TABLET | Freq: Three times a day (TID) | ORAL | Status: DC | PRN

## 2014-05-15 MED ORDER — AMOXICILLIN 500 MG PO CAPS: 500.0000 mg | ORAL_CAPSULE | Freq: Three times a day (TID) | ORAL | Status: DC

## 2014-05-15 NOTE — Progress Notes (Signed)
Patient ID: Brandon Clark, male   DOB: 08-27-1940, 73 y.o.   MRN: [id number]  CC: chest congestion, dental pain  HPI:  He c/o of chest congestion for past two weeks. He is coughing with yellow thick mucus and sore throat. He denies rhinitis, SOB, wheezing, fevers, and chills.  Dental pain for 3 weeks and has been using orajel. Has some gum bleeding with brushing.  He feels as if his blood pressure is elevated due to pain.  Feels like the dental pain is severe rated as a 10/10.  He is scheduled to have a colonoscopy on the 17th of this month.  He still c/o of abdominal bloating and discomfort.    No Known Allergies Past Medical History  Diagnosis Date  . Acid reflux   . Ulcer   . Sleep apnea    Current Outpatient Prescriptions on File Prior to Visit  Medication Sig Dispense Refill  . hydrochlorothiazide (HYDRODIURIL) 12.5 MG tablet Take 1 tablet (12.5 mg total) by mouth daily. 30 tablet 1  . pantoprazole (PROTONIX) 40 MG tablet Take 1 tablet (40 mg total) by mouth daily. 30 tablet 3  . diclofenac (VOLTAREN) 75 MG EC tablet Take 1 tablet (75 mg total) by mouth 2 (two) times daily. 60 tablet 1  . nitroGLYCERIN (NITRODUR - DOSED IN MG/24 HR) 0.2 mg/hr patch 1/4th patch over affected shoulder, change daily 30 patch 1  . simvastatin (ZOCOR) 10 MG tablet Take 1 tablet (10 mg total) by mouth at bedtime. 90 tablet 3   No current facility-administered medications on file prior to visit.   Family History  Problem Relation Age of Onset  . Diabetes Mother   . Diabetes Brother   . Heart attack Neg Hx   . Hyperlipidemia Neg Hx   . Hypertension Neg Hx   . Sudden death Neg Hx    History   Social History  . Marital Status: Married    Spouse Name: N/A    Number of Children: 7  . Years of Education: N/A   Occupational History  . Engineer    Social History Main Topics  . Smoking status: Never Smoker   . Smokeless tobacco: Never Used  . Alcohol Use: No  . Drug Use: No  . Sexual  Activity: No   Other Topics Concern  . Not on file   Social History Narrative    Review of Systems: See HPI   Objective:   Filed Vitals:   05/15/14 1633  BP: 143/102  Pulse: 89  Temp: 98.5 F (36.9 C)  Resp: 16    Physical Exam  HENT:  Right Ear: External ear normal.  Left Ear: External ear normal.  Very poor dentition, gum swelling, broken teeth  Eyes: Right eye exhibits no discharge. Left eye exhibits no discharge.  Cardiovascular: Normal rate, regular rhythm and normal heart sounds.   Pulmonary/Chest: Effort normal and breath sounds normal. He has no wheezes.  Abdominal: Soft. Bowel sounds are normal. He exhibits distension. There is no tenderness.  Musculoskeletal: Normal range of motion. He exhibits no edema.  Lymphadenopathy:    He has no cervical adenopathy.  Neurological: He is alert.  Skin: Skin is warm and dry.     Lab Results  Component Value Date   WBC 4.8 01/09/2014   HGB 15.6 01/09/2014   HCT 44.9 01/09/2014   MCV 86.0 01/09/2014   PLT 224 01/09/2014   Lab Results  Component Value Date   CREATININE 1.02 01/21/2013   BUN  17 01/21/2013   NA 137 01/21/2013   K 4.9 01/21/2013   CL 101 01/21/2013   CO2 29 01/21/2013    Lab Results  Component Value Date   HGBA1C 5.6 01/21/2013   Lipid Panel     Component Value Date/Time   CHOL 210* 01/21/2013 1229   TRIG 94 01/21/2013 1229   HDL 46 01/21/2013 1229   CHOLHDL 4.6 01/21/2013 1229   VLDL 19 01/21/2013 1229   LDLCALC 145* 01/21/2013 1229       Assessment and plan:   Adyan was seen today for follow-up.  Diagnoses and associated orders for this visit:  Pain due to dental caries - Ambulatory referral to Dentistry - traMADol (ULTRAM) 50 MG tablet; Take 1 tablet (50 mg total) by mouth every 8 (eight) hours as needed. - amoxicillin (AMOXIL) 500 MG capsule; Take 1 capsule (500 mg total) by mouth 3 (three) times daily. Explained to patient the importance of seeking dental care. May  use ibuprofen as directed for pain and tramadol with severe pain as needed.  Patient given dental referral list  Chest congestion Education provided to patient on the nature of viruses and how it does not require antibiotic use but symptomatic treatment.  Patient will rest, increase fluids, humidifier, avoid smoking/second hand smoke, and perform proper hand hygiene. If patient does not improve within one week they may return to clinic for further evaluation.   Need for influenza vaccination Influenza injection received.  Explained side effects and contraindications to patient. Information sheet given to patient.   Return in about 2 weeks (around 05/29/2014) for Nurse Visit-BP check and 3 mo PCP.  Interpreter was used to communicate directly with patient for the entire encounter including providing detailed patient instructions.        Holland CommonsKECK, VALERIE, NP-C [facility] [phone] 05/15/2014, 5:11 PM

## 2014-05-15 NOTE — Progress Notes (Signed)
Pt is here today because he has been coughing with congestion. Pt has a few skin tags under his arms that he wants to be looked at. Pt has a severe pain in his mouth for 2 weeks.

## 2014-05-15 NOTE — Patient Instructions (Signed)
Upper Respiratory Infection, Adult An upper respiratory infection (URI) is also known as the common cold. It is often caused by a type of germ (virus). Colds are easily spread (contagious). You can pass it to others by kissing, coughing, sneezing, or drinking out of the same glass. Usually, you get better in 1 or 2 weeks.  HOME CARE   Only take medicine as told by your doctor.  Use a warm mist humidifier or breathe in steam from a hot shower.  Drink enough water and fluids to keep your pee (urine) clear or pale yellow.  Get plenty of rest.  Return to work when your temperature is back to normal or as told by your doctor. You may use a face mask and wash your hands to stop your cold from spreading. GET HELP RIGHT AWAY IF:   After the first few days, you feel you are getting worse.  You have questions about your medicine.  You have chills, shortness of breath, or brown or red spit (mucus).  You have yellow or brown snot (nasal discharge) or pain in the face, especially when you bend forward.  You have a fever, puffy (swollen) neck, pain when you swallow, or white spots in the back of your throat.  You have a bad headache, ear pain, sinus pain, or chest pain.  You have a high-pitched whistling sound when you breathe in and out (wheezing).  You have a lasting cough or cough up blood.  You have sore muscles or a stiff neck. MAKE SURE YOU:   Understand these instructions.  Will watch your condition.  Will get help right away if you are not doing well or get worse. Document Released: 12/07/2007 Document Revised: 09/12/2011 Document Reviewed: 09/25/2013 Exit[employer] Patient Information 2015 [employer]. This information is not intended to replace advice given to you by your health care provider. Make sure you discuss any questions you have with your health care provider.  

## 2014-05-20 ENCOUNTER — Ambulatory Visit (AMBULATORY_SURGERY_CENTER): Payer: No Typology Code available for payment source | Admitting: Gastroenterology

## 2014-05-20 ENCOUNTER — Encounter: Payer: Self-pay | Admitting: Gastroenterology

## 2014-05-20 VITALS — BP 111/76 | HR 75 | Temp 98.1°F | Resp 21 | Ht 71.5 in | Wt 247.0 lb

## 2014-05-20 DIAGNOSIS — K2990 Gastroduodenitis, unspecified, without bleeding: Secondary | ICD-10-CM

## 2014-05-20 DIAGNOSIS — K2970 Gastritis, unspecified, without bleeding: Secondary | ICD-10-CM

## 2014-05-20 DIAGNOSIS — R1013 Epigastric pain: Secondary | ICD-10-CM

## 2014-05-20 DIAGNOSIS — K2940 Chronic atrophic gastritis without bleeding: Secondary | ICD-10-CM

## 2014-05-20 DIAGNOSIS — Z1211 Encounter for screening for malignant neoplasm of colon: Secondary | ICD-10-CM

## 2014-05-20 MED ORDER — HYOSCYAMINE SULFATE ER 0.375 MG PO TBCR: EXTENDED_RELEASE_TABLET | ORAL | Status: DC

## 2014-05-20 MED ORDER — SODIUM CHLORIDE 0.9 % IV SOLN: 500.0000 mL | INTRAVENOUS | Status: DC

## 2014-05-20 MED ORDER — HYOSCYAMINE SULFATE ER 0.375 MG PO TBCR: 0.3750 mg | EXTENDED_RELEASE_TABLET | Freq: Two times a day (BID) | ORAL | Status: DC

## 2014-05-20 MED ORDER — NA SULFATE-K SULFATE-MG SULF 17.5-3.13-1.6 GM/177ML PO SOLN: ORAL | Status: DC

## 2014-05-20 NOTE — Patient Instructions (Signed)
YOU HAD AN ENDOSCOPIC PROCEDURE TODAY AT THE [facility]: Refer to the procedure report that was given to you for any specific questions about what was found during the examination.  If the procedure report does not answer your questions, please call your gastroenterologist to clarify.  If you requested that your care partner not be given the details of your procedure findings, then the procedure report has been included in a sealed envelope for you to review at your convenience later.  YOU SHOULD EXPECT: Some feelings of bloating in the abdomen. Passage of more gas than usual.  Walking can help get rid of the air that was put into your GI tract during the procedure and reduce the bloating. If you had a lower endoscopy (such as a colonoscopy or flexible sigmoidoscopy) you may notice spotting of blood in your stool or on the toilet paper. If you underwent a bowel prep for your procedure, then you may not have a normal bowel movement for a few days.  DIET: Your first meal following the procedure should be a light meal and then it is ok to progress to your normal diet.  A half-sandwich or bowl of soup is an example of a good first meal.  Heavy or fried foods are harder to digest and may make you feel nauseous or bloated.  Likewise meals heavy in dairy and vegetables can cause extra gas to form and this can also increase the bloating.  Drink plenty of fluids but you should avoid alcoholic beverages for 24 hours.  ACTIVITY: Your care partner should take you home directly after the procedure.  You should plan to take it easy, moving slowly for the rest of the day.  You can resume normal activity the day after the procedure however you should NOT DRIVE or use heavy machinery for 24 hours (because of the sedation medicines used during the test).    SYMPTOMS TO REPORT IMMEDIATELY: A gastroenterologist can be reached at any hour.  During normal business hours, 8:30 AM to 5:00 PM Monday through Friday,  call [phone].  After hours and on weekends, please call the GI answering service at [phone] who will take a message and have the physician on call contact you.   Following upper endoscopy (EGD)  Vomiting of blood or coffee ground material  New chest pain or pain under the shoulder blades  Painful or persistently difficult swallowing  New shortness of breath  Fever of 100F or higher  Black, tarry-looking stools  FOLLOW UP: If any biopsies were taken you will be contacted by phone or by letter within the next 1-3 weeks.  Call your gastroenterologist if you have not heard about the biopsies in 3 weeks.  Our staff will call the home number listed on your records the next business day following your procedure to check on you and address any questions or concerns that you may have at that time regarding the information given to you following your procedure. This is a courtesy call and so if there is no answer at the home number and we have not heard from you through the emergency physician on call, we will assume that you have returned to your regular daily activities without incident.  SIGNATURES/CONFIDENTIALITY: You and/or your care partner have signed paperwork which will be entered into your electronic medical record.  These signatures attest to the fact that that the information above on your After Visit Summary has been reviewed and is understood.  Full responsibility   of the confidentiality of this discharge information lies with you and/or your care-partner.      Handouts were given to your care partner on gastritis, GERD, hiatal hernia. You may resume your current medications today.  Hold voltarn.  Use tylenol instead.  Continue protonix.   Hyomax rx as needed for abdominal cramps sent to your pharmacy by Dr. Arlyce DiceKaplan. Await biopsy results. Brandon Clark, CMA will call you with an appointment to follow up with Dr. Arlyce DiceKaplan in 4-6 weeks. Please call if any questions or concerns.

## 2014-05-20 NOTE — Progress Notes (Signed)
Called to room to assist during endoscopic procedure.  Patient ID and intended procedure confirmed with present staff. Received instructions for my participation in the procedure from the performing physician.  

## 2014-05-20 NOTE — Progress Notes (Signed)
Patient awakening,vss,report to rn 

## 2014-05-20 NOTE — Progress Notes (Signed)
Pt understand some english.  His son is his interpreter. maw

## 2014-05-20 NOTE — Op Note (Signed)
[facility] [address] [place], [id number]   ENDOSCOPY PROCEDURE REPORT  PATIENT: Brandon Clark, Brandon Clark  MR#: [id number] BIRTHDATE: 1941/03/02 , 73  yrs. old GENDER: male ENDOSCOPIST: Louis Meckelobert D Shah Insley, MD REFERRED BY: PROCEDURE DATE:  05/20/2014 PROCEDURE:  EGD w/ biopsy ASA CLASS:     Class II INDICATIONS:  epigastric pain. MEDICATIONS: Monitored anesthesia care and Propofol 200 mg IV TOPICAL ANESTHETIC:  DESCRIPTION OF PROCEDURE: After the risks benefits and alternatives of the procedure were thoroughly explained, informed consent was obtained.  The    endoscope was introduced through the mouth and advanced to the second portion of the duodenum , Without limitations.  The instrument was slowly withdrawn as the mucosa was fully examined.      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 Multiple biopsies were performed using cold biopsy forceps.  Sample obtained for diagnostic purposes. inflammation was low grade. There was a 5 cm sliding hiatal hernia present.  Also noted was free reflux into the esophagus from the gastric cardia. Retroflexed views revealed no abnormalities.     The scope was then withdrawn from the patient and the procedure completed.  COMPLICATIONS: There were no immediate complications.  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

## 2014-05-20 NOTE — Progress Notes (Signed)
Pt's son said his father requested rx for hyomax and his prep for his colonoscopy for Dec 1st with Dr. Arlyce DiceKaplan to be printed out.  Karl BalesKristen Westbrook, RN said the prep rx had been cancelled in the computer, but she reordered it and did print out the prep instruction.  Also written rx for hyomax given to the pt's son.  No complaints noted in the recovery room. Maw

## 2014-05-21 ENCOUNTER — Encounter: Payer: Self-pay | Admitting: *Deleted

## 2014-05-21 ENCOUNTER — Telehealth: Payer: Self-pay | Admitting: *Deleted

## 2014-05-21 NOTE — Telephone Encounter (Signed)
Number identifier, left message, follow-up  

## 2014-05-26 ENCOUNTER — Encounter: Payer: Self-pay | Admitting: Gastroenterology

## 2014-06-02 ENCOUNTER — Telehealth: Payer: Self-pay | Admitting: Gastroenterology

## 2014-06-02 NOTE — Telephone Encounter (Signed)
Moviprep sample given instead of suprep as these were the initial instructions given

## 2014-06-02 NOTE — Telephone Encounter (Signed)
Patients daughter phoned in and stated patient could not afford prep. Suprep sample given.

## 2014-06-03 ENCOUNTER — Encounter: Payer: Self-pay | Admitting: Gastroenterology

## 2014-06-03 ENCOUNTER — Ambulatory Visit (AMBULATORY_SURGERY_CENTER): Payer: No Typology Code available for payment source | Admitting: Gastroenterology

## 2014-06-03 VITALS — BP 143/105 | HR 88 | Temp 97.4°F | Resp 21 | Ht 71.5 in | Wt 247.0 lb

## 2014-06-03 DIAGNOSIS — Z1211 Encounter for screening for malignant neoplasm of colon: Secondary | ICD-10-CM

## 2014-06-03 DIAGNOSIS — K648 Other hemorrhoids: Secondary | ICD-10-CM

## 2014-06-03 MED ORDER — SODIUM CHLORIDE 0.9 % IV SOLN: 500.0000 mL | INTRAVENOUS | Status: DC

## 2014-06-03 NOTE — Progress Notes (Signed)
Awake and alert, oriented x3, vss, son at bedside, pleased with MAC, report to RN

## 2014-06-03 NOTE — Patient Instructions (Signed)
YOU HAD AN ENDOSCOPIC PROCEDURE TODAY AT THE [facility]: Refer to the procedure report that was given to you for any specific questions about what was found during the examination.  If the procedure report does not answer your questions, please call your gastroenterologist to clarify.  If you requested that your care partner not be given the details of your procedure findings, then the procedure report has been included in a sealed envelope for you to review at your convenience later.  YOU SHOULD EXPECT: Some feelings of bloating in the abdomen. Passage of more gas than usual.  Walking can help get rid of the air that was put into your GI tract during the procedure and reduce the bloating. If you had a lower endoscopy (such as a colonoscopy or flexible sigmoidoscopy) you may notice spotting of blood in your stool or on the toilet paper. If you underwent a bowel prep for your procedure, then you may not have a normal bowel movement for a few days.  DIET: Your first meal following the procedure should be a light meal and then it is ok to progress to your normal diet.  A half-sandwich or bowl of soup is an example of a good first meal.  Heavy or fried foods are harder to digest and may make you feel nauseous or bloated.  Likewise meals heavy in dairy and vegetables can cause extra gas to form and this can also increase the bloating.  Drink plenty of fluids but you should avoid alcoholic beverages for 24 hours.  ACTIVITY: Your care partner should take you home directly after the procedure.  You should plan to take it easy, moving slowly for the rest of the day.  You can resume normal activity the day after the procedure however you should NOT DRIVE or use heavy machinery for 24 hours (because of the sedation medicines used during the test).    SYMPTOMS TO REPORT IMMEDIATELY: A gastroenterologist can be reached at any hour.  During normal business hours, 8:30 AM to 5:00 PM Monday through Friday,  call [phone].  After hours and on weekends, please call the GI answering service at [phone] who will take a message and have the physician on call contact you.   Following lower endoscopy (colonoscopy or flexible sigmoidoscopy):  Excessive amounts of blood in the stool  Significant tenderness or worsening of abdominal pains  Swelling of the abdomen that is new, acute  Fever of 100F or higher  FOLLOW UP: If any biopsies were taken you will be contacted by phone or by letter within the next 1-3 weeks.  Call your gastroenterologist if you have not heard about the biopsies in 3 weeks.  Our staff will call the home number listed on your records the next business day following your procedure to check on you and address any questions or concerns that you may have at that time regarding the information given to you following your procedure. This is a courtesy call and so if there is no answer at the home number and we have not heard from you through the emergency physician on call, we will assume that you have returned to your regular daily activities without incident.  SIGNATURES/CONFIDENTIALITY: You and/or your care partner have signed paperwork which will be entered into your electronic medical record.  These signatures attest to the fact that that the information above on your After Visit Summary has been reviewed and is understood.  Full responsibility of the confidentiality of this   discharge information lies with you and/or your care-partner.  Recommendations Next colonoscopy in 10 years. Hemorrhoid handout provided to patient/care partner. 

## 2014-06-03 NOTE — Op Note (Signed)
[facility] [address] [place], [id number]   COLONOSCOPY PROCEDURE REPORT  PATIENT: Brandon Clark, Brandon Clark  MR#: [id number] BIRTHDATE: 11/12/1940 , 73  yrs. old GENDER: male ENDOSCOPIST: Louis Meckelobert D Imagene Boss, MD REFERRED BY: PROCEDURE DATE:  06/03/2014 PROCEDURE:   Colonoscopy, diagnostic First Screening Colonoscopy - Avg.  risk and is 50 yrs.  old or older Yes.  Prior Negative Screening - Now for repeat screening. N/A      Polyps Removed Today? No.  Recommend repeat exam, <10 yrs? No. ASA CLASS:   Class II INDICATIONS:first colonoscopy. MEDICATIONS: Monitored anesthesia care and Propofol 250 mg IV  DESCRIPTION OF PROCEDURE:   After the risks benefits and alternatives of the procedure were thoroughly explained, informed consent was obtained.  The digital rectal exam revealed no abnormalities of the rectum.   The LB [id number]  endoscope was introduced through the anus and advanced to the cecum, which was identified by both the appendix and ileocecal valve. No adverse events experienced.   The quality of the prep was Suprep good.  The instrument was then slowly withdrawn as the colon was fully examined.      COLON FINDINGS: Internal hemorrhoids were found.   The examination was otherwise normal.  Retroflexed views revealed no abnormalities. The time to cecum=5 minutes 02 seconds.  Withdrawal time=9 minutes 52 seconds.  The scope was withdrawn and the procedure completed. COMPLICATIONS: There were no immediate complications.  ENDOSCOPIC IMPRESSION: 1.   Internal hemorrhoids 2.   The examination was otherwise normal  RECOMMENDATIONS: Continue current colorectal screening recommendations for "routine risk" patients with a repeat colonoscopy in 10 years.  eSigned:  Louis Meckelobert D Viann Nielson, MD 06/03/2014 2:12 PM   cc: Jeanann LewandowskyJegede, Olugbemiga MD

## 2014-06-04 ENCOUNTER — Telehealth: Payer: Self-pay

## 2014-06-04 NOTE — Telephone Encounter (Signed)
  Follow up Call-  Call back number 06/03/2014 05/20/2014  Post procedure Call Back phone  # [phone]  Permission to leave phone message Yes Yes     Patient questions:  Do you have a fever, pain , or abdominal swelling? No. Pain Score  0 *  Have you tolerated food without any problems? Yes.    Have you been able to return to your normal activities? Yes.    Do you have any questions about your discharge instructions: Diet   No. Medications  No. Follow up visit  No.  Do you have questions or concerns about your Care? No.  Actions: * If pain score is 4 or above: No action needed, pain <4.   No problems per the pt's daughter, Barbarann Ehlersloia.  I asked her to please let the pt know we called to check on him.  If he has any questions or concerns to call us back. maw

## 2014-06-06 ENCOUNTER — Ambulatory Visit: Payer: No Typology Code available for payment source | Attending: Internal Medicine | Admitting: *Deleted

## 2014-06-06 VITALS — BP 134/84 | HR 83 | Temp 98.1°F | Resp 20

## 2014-06-06 DIAGNOSIS — K2990 Gastroduodenitis, unspecified, without bleeding: Secondary | ICD-10-CM

## 2014-06-06 DIAGNOSIS — K2970 Gastritis, unspecified, without bleeding: Secondary | ICD-10-CM | POA: Insufficient documentation

## 2014-06-06 DIAGNOSIS — G4733 Obstructive sleep apnea (adult) (pediatric): Secondary | ICD-10-CM | POA: Insufficient documentation

## 2014-06-06 MED ORDER — HYOSCYAMINE SULFATE ER 0.375 MG PO TBCR: 0.3750 mg | EXTENDED_RELEASE_TABLET | Freq: Two times a day (BID) | ORAL | Status: DC

## 2014-06-06 NOTE — Progress Notes (Signed)
Patient presents with son for BP check States taking all meds as directed Requesting f/u on sleep study; referral placed to pulmonary per PCP  Patient states he misplaced rx for hyoscymine. E-scribed to [facility] per PCP  BP 134/84 P 83 R 20 T 98.1 oral SPO2 94%  Patient advised to call for med refills at least 7 days before running out so as not to go without. Patient aware that he is to f/u with PCP 3 months from last visit (Due 08/15/14)

## 2014-06-09 ENCOUNTER — Other Ambulatory Visit: Payer: Self-pay | Admitting: Internal Medicine

## 2014-06-30 ENCOUNTER — Observation Stay (HOSPITAL_COMMUNITY)
Admission: EM | Admit: 2014-06-30 | Discharge: 2014-07-01 | Disposition: A | Discharge status: Home / Self Care | Payer: MEDICAID | Attending: Internal Medicine | Admitting: Internal Medicine

## 2014-06-30 ENCOUNTER — Emergency Department (HOSPITAL_COMMUNITY): Payer: MEDICAID

## 2014-06-30 ENCOUNTER — Encounter (HOSPITAL_COMMUNITY): Payer: Self-pay | Admitting: *Deleted

## 2014-06-30 DIAGNOSIS — E785 Hyperlipidemia, unspecified: Secondary | ICD-10-CM | POA: Insufficient documentation

## 2014-06-30 DIAGNOSIS — R270 Ataxia, unspecified: Secondary | ICD-10-CM

## 2014-06-30 DIAGNOSIS — I639 Cerebral infarction, unspecified: Secondary | ICD-10-CM

## 2014-06-30 DIAGNOSIS — H8110 Benign paroxysmal vertigo, unspecified ear: Principal | ICD-10-CM | POA: Diagnosis present

## 2014-06-30 DIAGNOSIS — M25512 Pain in left shoulder: Secondary | ICD-10-CM | POA: Insufficient documentation

## 2014-06-30 DIAGNOSIS — G4733 Obstructive sleep apnea (adult) (pediatric): Secondary | ICD-10-CM | POA: Insufficient documentation

## 2014-06-30 DIAGNOSIS — M25511 Pain in right shoulder: Secondary | ICD-10-CM | POA: Diagnosis present

## 2014-06-30 DIAGNOSIS — I674 Hypertensive encephalopathy: Secondary | ICD-10-CM | POA: Insufficient documentation

## 2014-06-30 DIAGNOSIS — R42 Dizziness and giddiness: Secondary | ICD-10-CM

## 2014-06-30 DIAGNOSIS — I10 Essential (primary) hypertension: Secondary | ICD-10-CM | POA: Diagnosis present

## 2014-06-30 DIAGNOSIS — K219 Gastro-esophageal reflux disease without esophagitis: Secondary | ICD-10-CM | POA: Insufficient documentation

## 2014-06-30 DIAGNOSIS — F419 Anxiety disorder, unspecified: Secondary | ICD-10-CM | POA: Insufficient documentation

## 2014-06-30 DIAGNOSIS — G4730 Sleep apnea, unspecified: Secondary | ICD-10-CM | POA: Insufficient documentation

## 2014-06-30 DIAGNOSIS — F410 Panic disorder [episodic paroxysmal anxiety] without agoraphobia: Secondary | ICD-10-CM | POA: Insufficient documentation

## 2014-06-30 LAB — ETHANOL: Alcohol, Ethyl (B): <5 mg/dL (ref 0–9)

## 2014-06-30 LAB — DIFFERENTIAL
BASOS PCT: 1 % (ref 0–1)
Basophils Absolute: 0 10*3/uL (ref 0.0–0.1)
EOS ABS: 0.2 10*3/uL (ref 0.0–0.7)
EOS PCT: 4 % (ref 0–5)
LYMPHS ABS: 2 10*3/uL (ref 0.7–4.0)
Lymphocytes Relative: 50 % — ABNORMAL HIGH (ref 12–46)
Monocytes Absolute: 0.4 10*3/uL (ref 0.1–1.0)
Monocytes Relative: 11 % (ref 3–12)
Neutro Abs: 1.4 10*3/uL — ABNORMAL LOW (ref 1.7–7.7)
Neutrophils Relative %: 34 % — ABNORMAL LOW (ref 43–77)

## 2014-06-30 LAB — COMPREHENSIVE METABOLIC PANEL
ALBUMIN: 3.8 g/dL (ref 3.5–5.2)
ALT: 15 U/L (ref 0–53)
ANION GAP: 7 (ref 5–15)
AST: 19 U/L (ref 0–37)
Alkaline Phosphatase: 55 U/L (ref 39–117)
BUN: 14 mg/dL (ref 6–23)
CALCIUM: 9.1 mg/dL (ref 8.4–10.5)
CO2: 26 mmol/L (ref 19–32)
Chloride: 104 mEq/L (ref 96–112)
Creatinine, Ser: 1.18 mg/dL (ref 0.50–1.35)
GFR calc Af Amer: 70 mL/min — ABNORMAL LOW (ref >90)
GFR calc non Af Amer: 60 mL/min — ABNORMAL LOW (ref >90)
GLUCOSE: 121 mg/dL — AB (ref 70–99)
Potassium: 4 mmol/L (ref 3.5–5.1)
SODIUM: 137 mmol/L (ref 135–145)
TOTAL PROTEIN: 6.5 g/dL (ref 6.0–8.3)
Total Bilirubin: 0.3 mg/dL (ref 0.3–1.2)

## 2014-06-30 LAB — GLUCOSE, CAPILLARY
Glucose-Capillary: 153 mg/dL — ABNORMAL HIGH (ref 70–99)
Glucose-Capillary: 95 mg/dL (ref 70–99)

## 2014-06-30 LAB — RAPID URINE DRUG SCREEN, HOSP PERFORMED
AMPHETAMINES: NOT DETECTED
BENZODIAZEPINES: NOT DETECTED
Barbiturates: NOT DETECTED
COCAINE: NOT DETECTED
Opiates: NOT DETECTED
Tetrahydrocannabinol: NOT DETECTED

## 2014-06-30 LAB — TROPONIN I
Troponin I: 0.04 ng/mL — ABNORMAL HIGH (ref <0.031)
Troponin I: 0.03 ng/mL (ref <0.031)

## 2014-06-30 LAB — I-STAT CHEM 8, ED
BUN: 16 mg/dL (ref 6–23)
Calcium, Ion: 1.14 mmol/L (ref 1.13–1.30)
Chloride: 101 mEq/L (ref 96–112)
Creatinine, Ser: 1.1 mg/dL (ref 0.50–1.35)
GLUCOSE: 119 mg/dL — AB (ref 70–99)
HCT: 53 % — ABNORMAL HIGH (ref 39.0–52.0)
HEMOGLOBIN: 18 g/dL — AB (ref 13.0–17.0)
POTASSIUM: 3.8 mmol/L (ref 3.5–5.1)
Sodium: 139 mmol/L (ref 135–145)
TCO2: 22 mmol/L (ref 0–100)

## 2014-06-30 LAB — URINALYSIS, ROUTINE W REFLEX MICROSCOPIC
BILIRUBIN URINE: NEGATIVE
GLUCOSE, UA: NEGATIVE mg/dL
KETONES UR: NEGATIVE mg/dL
Leukocytes, UA: NEGATIVE
Nitrite: NEGATIVE
Protein, ur: NEGATIVE mg/dL
Specific Gravity, Urine: 1.012 (ref 1.005–1.030)
Urobilinogen, UA: 0.2 mg/dL (ref 0.0–1.0)
pH: 5 (ref 5.0–8.0)

## 2014-06-30 LAB — CBC
HCT: 48.5 % (ref 39.0–52.0)
Hemoglobin: 16.9 g/dL (ref 13.0–17.0)
MCH: 30 pg (ref 26.0–34.0)
MCHC: 34.8 g/dL (ref 30.0–36.0)
MCV: 86 fL (ref 78.0–100.0)
Platelets: 204 10*3/uL (ref 150–400)
RBC: 5.64 MIL/uL (ref 4.22–5.81)
RDW: 13.7 % (ref 11.5–15.5)
WBC: 4 10*3/uL (ref 4.0–10.5)

## 2014-06-30 LAB — URINE MICROSCOPIC-ADD ON

## 2014-06-30 LAB — CBG MONITORING, ED: GLUCOSE-CAPILLARY: 104 mg/dL — AB (ref 70–99)

## 2014-06-30 LAB — I-STAT TROPONIN, ED: TROPONIN I, POC: 0.01 ng/mL (ref 0.00–0.08)

## 2014-06-30 LAB — APTT: aPTT: 31 seconds (ref 24–37)

## 2014-06-30 LAB — HEMOGLOBIN A1C
Hgb A1c MFr Bld: 6.3 % — ABNORMAL HIGH (ref <5.7)
Mean Plasma Glucose: 134 mg/dL — ABNORMAL HIGH (ref <117)

## 2014-06-30 LAB — PROTIME-INR
INR: 1.01 (ref 0.00–1.49)
PROTHROMBIN TIME: 13.5 s (ref 11.6–15.2)

## 2014-06-30 MED ORDER — HYDROCHLOROTHIAZIDE 12.5 MG PO CAPS
12.5000 mg | ORAL_CAPSULE | Freq: Every day | ORAL | Status: DC
  Administered 2014-07-01: 12.5 mg via ORAL
  Filled 2014-06-30: qty 1

## 2014-06-30 MED ORDER — STROKE: EARLY STAGES OF RECOVERY BOOK
Freq: Once | Status: AC
  Administered 2014-07-01: 06:00:00
  Filled 2014-06-30: qty 1

## 2014-06-30 MED ORDER — HYDROCHLOROTHIAZIDE 25 MG PO TABS: 12.5000 mg | ORAL_TABLET | Freq: Every day | ORAL | Status: DC

## 2014-06-30 MED ORDER — MECLIZINE HCL 25 MG PO TABS
12.5000 mg | ORAL_TABLET | Freq: Once | ORAL | Status: AC
  Administered 2014-06-30: 12.5 mg via ORAL
  Filled 2014-06-30: qty 1

## 2014-06-30 MED ORDER — SIMVASTATIN 5 MG PO TABS: 10.0000 mg | ORAL_TABLET | Freq: Every day | ORAL | Status: DC

## 2014-06-30 MED ORDER — HYDROCODONE-ACETAMINOPHEN 5-325 MG PO TABS
1.0000 | ORAL_TABLET | Freq: Once | ORAL | Status: AC
  Administered 2014-06-30: 1 via ORAL
  Filled 2014-06-30: qty 1

## 2014-06-30 MED ORDER — HEPARIN SODIUM (PORCINE) 5000 UNIT/ML IJ SOLN
5000.0000 [IU] | Freq: Three times a day (TID) | INTRAMUSCULAR | Status: DC
  Administered 2014-06-30 – 2014-07-01 (×3): 5000 [IU] via SUBCUTANEOUS
  Filled 2014-06-30 (×3): qty 1

## 2014-06-30 MED ORDER — ASPIRIN EC 81 MG PO TBEC
81.0000 mg | DELAYED_RELEASE_TABLET | Freq: Every day | ORAL | Status: DC
  Administered 2014-06-30 – 2014-07-01 (×2): 81 mg via ORAL
  Filled 2014-06-30 (×2): qty 1

## 2014-06-30 MED ORDER — TRAMADOL HCL 50 MG PO TABS
50.0000 mg | ORAL_TABLET | Freq: Once | ORAL | Status: AC
  Administered 2014-06-30: 50 mg via ORAL
  Filled 2014-06-30: qty 1

## 2014-06-30 NOTE — ED Provider Notes (Signed)
CSN: [id number]     Arrival date & time 06/30/14  0720 History   First MD Initiated Contact with Patient 06/30/14 [phone]     Chief Complaint  Patient presents with  . Dizziness     (Consider location/radiation/quality/duration/timing/severity/associated sxs/prior Treatment) Patient is a 73 y.o. male presenting with dizziness. The history is limited by a language barrier. A language interpreter was used.  Dizziness Quality:  Room spinning Severity:  Severe Onset quality:  Sudden Duration:  2 hours (time of onset approximately 5:45 am) Timing:  Constant Progression:  Partially resolved Chronicity:  Recurrent (similar episode one year ago) Context comment:  Was driving at the time of onset Relieved by:  Nothing Worsened by:  Nothing tried Associated symptoms: tinnitus (occasionally, not today)   Associated symptoms: no chest pain, no headaches and no vomiting     Past Medical History  Diagnosis Date  . Acid reflux   . Ulcer   . Sleep apnea   . Hypertension   . Hyperlipidemia    Past Surgical History  Procedure Laterality Date  . Facial lacerations repair     Family History  Problem Relation Age of Onset  . Diabetes Mother   . Diabetes Brother   . Heart attack Neg Hx   . Hyperlipidemia Neg Hx   . Hypertension Neg Hx   . Sudden death Neg Hx    History  Substance Use Topics  . Smoking status: Never Smoker   . Smokeless tobacco: Never Used  . Alcohol Use: No    Review of Systems  HENT: Positive for tinnitus (occasionally, not today).   Cardiovascular: Negative for chest pain.  Gastrointestinal: Negative for vomiting.  Neurological: Positive for dizziness. Negative for headaches.  All other systems reviewed and are negative.     Allergies  Review of patient's allergies indicates no known allergies.  Home Medications   Prior to Admission medications   Medication Sig Start Date End Date Taking? Authorizing Provider  diclofenac (VOLTAREN) 75 MG EC tablet  Take 1 tablet (75 mg total) by mouth 2 (two) times daily. Patient not taking: Reported on 06/03/2014 02/21/14   Lenda KelpShane R Hudnall, MD  hydrochlorothiazide (HYDRODIURIL) 12.5 MG tablet Take 1 tablet (12.5 mg total) by mouth daily. 02/13/14   Ambrose FinlandValerie A Keck, NP  Hyoscyamine Sulfate 0.375 MG TBCR Take 1 tablet (0.375 mg total) by mouth 2 (two) times daily. One tab twice a day for 5 days then as needed, abdominal pain. 06/06/14   Ambrose FinlandValerie A Keck, NP  nitroGLYCERIN (NITRODUR - DOSED IN MG/24 HR) 0.2 mg/hr patch 1/4th patch over affected shoulder, change daily 02/21/14   Lenda KelpShane R Hudnall, MD  pantoprazole (PROTONIX) 40 MG tablet Take 1 tablet (40 mg total) by mouth daily. 05/08/14   Ambrose FinlandValerie A Keck, NP  simvastatin (ZOCOR) 10 MG tablet Take 1 tablet (10 mg total) by mouth at bedtime. 02/13/14   Ambrose FinlandValerie A Keck, NP  traMADol (ULTRAM) 50 MG tablet Take 1 tablet (50 mg total) by mouth every 8 (eight) hours as needed. 05/15/14   Ambrose FinlandValerie A Keck, NP   BP 152/108 mmHg  Pulse 92  Temp(Src) 97.6 F (36.4 C) (Oral)  Resp 22  SpO2 96% Physical Exam  Constitutional: He is oriented to person, place, and time. He appears well-developed and well-nourished. No distress.  HENT:  Head: Normocephalic and atraumatic.  Mouth/Throat: Oropharynx is clear and moist.  Eyes: Conjunctivae are normal. Pupils are equal, round, and reactive to light. No scleral icterus.  Neck: Neck  supple.  Cardiovascular: Normal rate, regular rhythm, normal heart sounds and intact distal pulses.   No murmur heard. Pulmonary/Chest: Effort normal and breath sounds normal. No stridor. No respiratory distress. He has no wheezes. He has no rales.  Abdominal: Soft. He exhibits no distension. There is no tenderness.  Musculoskeletal: Normal range of motion. He exhibits no edema.  Neurological: He is alert and oriented to person, place, and time. He has normal strength. No cranial nerve deficit (slight flattening of NLF on right without any detectable muscle  weakness) or sensory deficit. Gait (ataxic) abnormal. Coordination normal. GCS eye subscore is 4. GCS verbal subscore is 5. GCS motor subscore is 6.  Skin: Skin is warm and dry. No rash noted.  Psychiatric: He has a normal mood and affect. His behavior is normal.  Nursing note and vitals reviewed.   ED Course  Procedures (including critical care time) Labs Review Labs Reviewed  DIFFERENTIAL - Abnormal; Notable for the following:    Neutrophils Relative % 34 (*)    Neutro Abs 1.4 (*)    Lymphocytes Relative 50 (*)    All other components within normal limits  COMPREHENSIVE METABOLIC PANEL - Abnormal; Notable for the following:    Glucose, Bld 121 (*)    GFR calc non Af Amer 60 (*)    GFR calc Af Amer 70 (*)    All other components within normal limits  URINALYSIS, ROUTINE W REFLEX MICROSCOPIC - Abnormal; Notable for the following:    Hgb urine dipstick TRACE (*)    All other components within normal limits  URINE MICROSCOPIC-ADD ON - Abnormal; Notable for the following:    Casts HYALINE CASTS (*)    All other components within normal limits  TROPONIN I - Abnormal; Notable for the following:    Troponin I 0.04 (*)    All other components within normal limits  CBG MONITORING, ED - Abnormal; Notable for the following:    Glucose-Capillary 104 (*)    All other components within normal limits  I-STAT CHEM 8, ED - Abnormal; Notable for the following:    Glucose, Bld 119 (*)    Hemoglobin 18.0 (*)    HCT 53.0 (*)    All other components within normal limits  ETHANOL  PROTIME-INR  APTT  CBC  URINE RAPID DRUG SCREEN (HOSP PERFORMED)  HEMOGLOBIN A1C  LIPID PANEL  TROPONIN I  TROPONIN I  I-STAT TROPOININ, ED  Rosezena Sensor, ED    Imaging Review Mr Maxine Glenn Head Wo Contrast  06/30/2014   CLINICAL DATA:  Vertigo and ataxia. No indication about duration of symptoms.  EXAM: MRI HEAD WITHOUT CONTRAST  MRA HEAD WITHOUT CONTRAST  TECHNIQUE: Multiplanar, multiecho pulse sequences of  the brain and surrounding structures were obtained without intravenous contrast. Angiographic images of the head were obtained using MRA technique without contrast.  COMPARISON:  None.  FINDINGS: MRI HEAD FINDINGS  Diffusion imaging does not show any acute or subacute infarction. The brainstem and cerebellum are normal. The cerebral hemispheres do not show accelerated atrophy. There are minimal small vessel changes of the white matter, less than often seen in healthy individuals of this age. No cortical or large vessel territory infarction. No mass lesion, hemorrhage, hydrocephalus or extra-axial collection. No pituitary mass. No fluid in the sinuses, middle ears or mastoids. No visible cranial nerve abnormality.  MRA HEAD FINDINGS  Both internal carotid arteries are widely patent into the brain. The anterior and middle cerebral vessels are patent without proximal stenosis,  aneurysm or vascular malformation.  The left vertebral artery is a large vessel widely patent to the basilar. The right vertebral artery is a small vessel that terminates in PICA. No basilar stenosis. Posterior circulation branch vessels appear normal.  IMPRESSION: No acute infarction. Minimal small vessel change of the cerebral hemispheric white matter, less than often seen in healthy individuals of this age. No specific cause of vertigo or ataxia is identified.  Negative intracranial MR angiography of the large and medium size vessels.   Electronically Signed   By: Paulina FusiMark  Shogry M.D.   On: 06/30/2014 10:57   Mr Brain Wo Contrast  06/30/2014   CLINICAL DATA:  Vertigo and ataxia. No indication about duration of symptoms.  EXAM: MRI HEAD WITHOUT CONTRAST  MRA HEAD WITHOUT CONTRAST  TECHNIQUE: Multiplanar, multiecho pulse sequences of the brain and surrounding structures were obtained without intravenous contrast. Angiographic images of the head were obtained using MRA technique without contrast.  COMPARISON:  None.  FINDINGS: MRI HEAD FINDINGS   Diffusion imaging does not show any acute or subacute infarction. The brainstem and cerebellum are normal. The cerebral hemispheres do not show accelerated atrophy. There are minimal small vessel changes of the white matter, less than often seen in healthy individuals of this age. No cortical or large vessel territory infarction. No mass lesion, hemorrhage, hydrocephalus or extra-axial collection. No pituitary mass. No fluid in the sinuses, middle ears or mastoids. No visible cranial nerve abnormality.  MRA HEAD FINDINGS  Both internal carotid arteries are widely patent into the brain. The anterior and middle cerebral vessels are patent without proximal stenosis, aneurysm or vascular malformation.  The left vertebral artery is a large vessel widely patent to the basilar. The right vertebral artery is a small vessel that terminates in PICA. No basilar stenosis. Posterior circulation branch vessels appear normal.  IMPRESSION: No acute infarction. Minimal small vessel change of the cerebral hemispheric white matter, less than often seen in healthy individuals of this age. No specific cause of vertigo or ataxia is identified.  Negative intracranial MR angiography of the large and medium size vessels.   Electronically Signed   By: Paulina FusiMark  Shogry M.D.   On: 06/30/2014 10:57     EKG Interpretation   Date/Time:  Monday June 30 2014 07:22:55 EST Ventricular Rate:  94 PR Interval:  163 QRS Duration: 94 QT Interval:  369 QTC Calculation: 461 R Axis:   -27 Text Interpretation:  Sinus rhythm Borderline left axis deviation RSR' in  V1 or V2, probably normal variant No old tracing to compare Confirmed by  [facility]  MD, TREY ([id number]) on 06/30/2014 9:40:21 AM      MDM   Final diagnoses:  Vertigo    8:07 AM  73 yo male with presentation of dizziness.  Described as room spinning.  It was difficult to quickly obtain an accurate history due to language barrier.  At this time, pt feeling much better, but still has  ataxia on gait testing.  Due to sudden onset and acute time frame, have asked Dr. Cyril Mourningamillo (Neurology) to see patient.  He is in ED seeing pt now.    5:09 PM Dr. Cyril Mourningamillo recommended MRI, which fortunately did not show acute stroke.  He recommended admission for further stroke workup.  Awaiting admission.    Candyce ChurnJohn David Tanya Marvin III, MD 06/30/14 [id number]

## 2014-06-30 NOTE — ED Notes (Signed)
Pt remains monitored by blood pressure and pulse ox. Pts family remains at bedside.  

## 2014-06-30 NOTE — Consult Note (Addendum)
Referring Physician: Dr. Loretha StaplerWofford    Chief Complaint: vertigo, ataxia  HPI:                                                                                                                                         Brandon Clark is an 73 y.o. male with a past medical history significant for HTN, hyperlipidemia, sleep apnea, brought in by police after being found driving erratically earlier this morning. Patient speaks arabic but his son is at the bedside and helps to translate. Son stated that he was last seen normal around midnight yesterday. Brandon Clark said that he did not sleep well last night but was doing well when he got into his car this morning, [place] 9 [place], started having a severe " spinning sensation and feeling anxious when the police found him driving erratically in the opposite side of the street. According to the police, when he got out of the car he was walking like a drunk person and was grabbing his chest. Patient denied associated HA, double vision, difficulty swallowing, slurred speech, language or visual impairment but said that the left leg feels heavy. He recall having " similar" symptoms when he had a panic attack many years ago. Upon arrival to the ED he was noted to have an ataxic gait without other associated neurological signs. NIHSS 0. Date last known well: 06/30/14 Time last known well: 12 am tPA Given: no, out of the window, NIHSS 0 NIHSS: 0   Past Medical History  Diagnosis Date  . Acid reflux   . Ulcer   . Sleep apnea   . Hypertension   . Hyperlipidemia     Past Surgical History  Procedure Laterality Date  . Facial lacerations repair      Family History  Problem Relation Age of Onset  . Diabetes Mother   . Diabetes Brother   . Heart attack Neg Hx   . Hyperlipidemia Neg Hx   . Hypertension Neg Hx   . Sudden death Neg Hx    Social History:  reports that he has never smoked. He has never used  smokeless tobacco. He reports that he does not drink alcohol or use illicit drugs.  Allergies: No Known Allergies  Medications:  I have reviewed the patient's current medications.  ROS:                                                                                                                                       History obtained from the patient via interpreter and his son  General ROS: negative for - chills, fatigue, fever, night sweats, weight gain or weight loss Psychological ROS: negative for - behavioral disorder, hallucinations, memory difficulties, mood swings or suicidal ideation Ophthalmic ROS: negative for - blurry vision, double vision, eye pain or loss of vision ENT ROS: negative for - epistaxis, nasal discharge, oral lesions, sore throat, tinnitus or vertigo Allergy and Immunology ROS: negative for - hives or itchy/watery eyes Hematological and Lymphatic ROS: negative for - bleeding problems, bruising or swollen lymph nodes Endocrine ROS: negative for - galactorrhea, hair pattern changes, polydipsia/polyuria or temperature intolerance Respiratory ROS: negative for - cough, hemoptysis, shortness of breath or wheezing Cardiovascular ROS: negative for - chest pain, dyspnea on exertion, edema or irregular heartbeat Gastrointestinal ROS: negative for - abdominal pain, diarrhea, hematemesis, nausea/vomiting or stool incontinence Genito-Urinary ROS: negative for - dysuria, hematuria, incontinence or urinary frequency/urgency Musculoskeletal ROS: negative for - joint swelling or muscular weakness Neurological ROS: as noted in HPI Dermatological ROS: negative for rash and skin lesion changes  Physical exam: pleasant male in no apparent distress. Ead: Head: normocephalic. Neck: supple, no bruits, no JVD. Cardiac: no murmurs. Lungs: clear. Abdomen: soft, no  tender, no mass. Extremities: no edema. Neurologic Examination:                                                                                                      General: Mental Status: Alert, oriented, thought content appropriate.  Speech fluent without evidence of aphasia.  Able to follow 3 step commands without difficulty. Cranial Nerves: II: Discs flat bilaterally; Visual fields grossly normal, pupils equal, round, reactive to light and accommodation III,IV, VI: ptosis not present, extra-ocular motions intact bilaterally V,VII: smile symmetric, facial light touch sensation normal bilaterally VIII: hearing normal bilaterally IX,X: gag reflex present XI: bilateral shoulder shrug XII: midline tongue extension without atrophy or fasciculations  Motor: Right : Upper extremity   5/5    Left:     Upper extremity   5/5  Lower extremity   5/5     Lower extremity   5/5 Tone and bulk:normal tone throughout; no atrophy noted Sensory: Pinprick and light touch intact throughout, bilaterally Deep Tendon Reflexes:  Right: Upper Extremity   Left: Upper extremity   biceps (C-5 to C-6) 2/4   biceps (C-5 to C-6) 2/4 tricep (C7) 2/4    triceps (C7) 2/4 Brachioradialis (C6) 2/4  Brachioradialis (C6) 2/4  Lower Extremity Lower Extremity  quadriceps (L-2 to L-4) 2/4   quadriceps (L-2 to L-4) 2/4 Achilles (S1) 2/4   Achilles (S1) 2/4  Plantars: Right: downgoing   Left: downgoing Cerebellar: normal finger-to-nose,  normal heel-to-shin test Gait: ataxic CV: pulses palpable throughout    Results for orders placed or performed during the hospital encounter of 06/30/14 (from the past 48 hour(s))  CBG monitoring, ED     Status: Abnormal   Collection Time: 06/30/14  7:36 AM  Result Value Ref Range   Glucose-Capillary 104 (H) 70 - 99 mg/dL   No results found.   Assessment: 73 y.o. male brought in due to acute onset vertigo and gait ataxia. He has risk factors for stroke and with his present  syndrome can not ruled out a posterior circulation stroke. Patient NIHSS 0 and out of the window for thrombolysis. Will suggest admission to medicine and stroke work up. Aspirin when able to pass swallowing evaluation. Will follow up.  Stroke Risk Factors -age, HTN, hyperlipidemia.  Plan: 1. HgbA1c, fasting lipid panel 2. MRI, MRA  of the brain without contrast 3. Echocardiogram 4. Carotid dopplers 5. Prophylactic therapy-aspirin after passing swallowing eval 6. Risk factor modification 7. Telemetry monitoring 8. Frequent neuro checks 9. PT/OT SLP   Wyatt Portelasvaldo Camilo, MD [facility] [phone]  06/30/2014, 8:17 AM

## 2014-06-30 NOTE — ED Notes (Signed)
Pt stood to take off jeans-- less dizzy, more steady on feet.

## 2014-06-30 NOTE — ED Notes (Signed)
[employer] interpre[employer] notified--

## 2014-06-30 NOTE — ED Notes (Signed)
Pt speaks ARABIC/Egyptian -- understands small amount of English.

## 2014-06-30 NOTE — ED Notes (Addendum)
Patient pulled over by police this am for erratic driving, police then called ems for c/o dizziness and unsteady on feet, patient with slight right sided facial droop, unable to answer appropriately, patient with language barrier,

## 2014-06-30 NOTE — ED Notes (Signed)
Admitting at the bedside.  

## 2014-06-30 NOTE — ED Notes (Signed)
Pt states -- through interpretor -- that he has had dizziness this am, a little shortness of breath, hx of high blood pressure, weakness in both legs,  HX of pain in right shoulder using pain killers,

## 2014-06-30 NOTE — H&P (Signed)
[facility] History and Physical  Brandon Clark NUU:[id number] DOB: 1941-05-06 DOA: 06/30/2014  Referring physician: Dr. Loretha StaplerWofford PCP: Jeanann LewandowskyJEGEDE, OLUGBEMIGA, MD   Chief Complaint: Vertigo   HPI: Brandon Clark is a 73 y.o. male  With history of hypertension who presented after developing vertigo. Patient was found by police officers driving on the opposite side of the road. At least he was unsteady on his feet and holding his chest. The history is obtained from son at bedside as patient speaks Arabic. Problem came on insidiously. Currently he denies any vertigo. Patient was given meclizine    given persistent symptoms since onset patient was referred for medical evaluation. Neurology consulted   Review of Systems:  Unable to obtain due to patient speaking arabic   Past Medical History  Diagnosis Date  . Acid reflux   . Ulcer   . Sleep apnea   . Hypertension   . Hyperlipidemia    Past Surgical History  Procedure Laterality Date  . Facial lacerations repair     Social History:  reports that he has never smoked. He has never used smokeless tobacco. He reports that he does not drink alcohol or use illicit drugs.  No Known Allergies  Family History  Problem Relation Age of Onset  . Diabetes Mother   . Diabetes Brother   . Heart attack Neg Hx   . Hyperlipidemia Neg Hx   . Hypertension Neg Hx   . Sudden death Neg Hx      Prior to Admission medications   Medication Sig Start Date End Date Taking? Authorizing Provider  diclofenac (VOLTAREN) 75 MG EC tablet Take 1 tablet (75 mg total) by mouth 2 (two) times daily. 02/21/14  Yes Lenda KelpShane R Hudnall, MD  hydrochlorothiazide (HYDRODIURIL) 12.5 MG tablet Take 1 tablet (12.5 mg total) by mouth daily. 02/13/14  Yes Ambrose FinlandValerie A Keck, NP  Hyoscyamine Sulfate 0.375 MG TBCR Take 1 tablet (0.375 mg total) by mouth 2 (two) times daily. One tab twice a day for 5 days then as needed, abdominal pain. 06/06/14  Yes Ambrose FinlandValerie A Keck, NP    ibuprofen (ADVIL,MOTRIN) 200 MG tablet Take 400 mg by mouth every 6 (six) hours as needed for mild pain.   Yes Historical Provider, MD  simvastatin (ZOCOR) 10 MG tablet Take 1 tablet (10 mg total) by mouth at bedtime. 02/13/14  Yes Ambrose FinlandValerie A Keck, NP  nitroGLYCERIN (NITRODUR - DOSED IN MG/24 HR) 0.2 mg/hr patch 1/4th patch over affected shoulder, change daily 02/21/14   Lenda KelpShane R Hudnall, MD  pantoprazole (PROTONIX) 40 MG tablet Take 1 tablet (40 mg total) by mouth daily. Patient not taking: Reported on 06/30/2014 05/08/14   Ambrose FinlandValerie A Keck, NP  traMADol (ULTRAM) 50 MG tablet Take 1 tablet (50 mg total) by mouth every 8 (eight) hours as needed. Patient not taking: Reported on 06/30/2014 05/15/14   Ambrose FinlandValerie A Keck, NP   Physical Exam: Filed Vitals:   06/30/14 0824 06/30/14 1120 06/30/14 1130 06/30/14 1249  BP:  132/102 129/106 142/101  Pulse:  66 61 68  Temp: 97.6 F (36.4 C)   97 F (36.1 C)  TempSrc:    Oral  Resp:  18  18  SpO2:  96% 95% 92%    Wt Readings from Last 3 Encounters:  06/03/14 112.038 kg (247 lb)  05/20/14 112.038 kg (247 lb)  05/15/14 114.306 kg (252 lb)    General:  Appears calm and comfortable Eyes: PERRL, normal lids, irises & conjunctiva ENT: grossly normal  hearing, lips & tongue Neck: no LAD, masses or thyromegaly Cardiovascular: RRR, no m/r/g. No LE edema. Telemetry: SR, no arrhythmias  Respiratory: CTA bilaterally, no w/r/r. Normal respiratory effort. Abdomen: soft, nt, nd Skin: no rash or induration seen on limited exam Musculoskeletal: grossly normal tone BUE/BLE Psychiatric: unable to accurately assess given language barrier Neurologic:  finger to nose intact, equal grip strength, equal strength in lower extremities           Labs on Admission:  Basic Metabolic Panel:  Recent Labs Lab 06/30/14 0820 06/30/14 0848  NA 137 139  K 4.0 3.8  CL 104 101  CO2 26  --   GLUCOSE 121* 119*  BUN 14 16  CREATININE 1.18 1.10  CALCIUM 9.1  --    Liver  Function Tests:  Recent Labs Lab 06/30/14 0820  AST 19  ALT 15  ALKPHOS 55  BILITOT 0.3  PROT 6.5  ALBUMIN 3.8   No results for input(s): LIPASE, AMYLASE in the last 168 hours. No results for input(s): AMMONIA in the last 168 hours. CBC:  Recent Labs Lab 06/30/14 0820 06/30/14 0848  WBC 4.0  --   NEUTROABS 1.4*  --   HGB 16.9 18.0*  HCT 48.5 53.0*  MCV 86.0  --   PLT 204  --    Cardiac Enzymes: No results for input(s): CKTOTAL, CKMB, CKMBINDEX, TROPONINI in the last 168 hours.  BNP (last 3 results) No results for input(s): PROBNP in the last 8760 hours. CBG:  Recent Labs Lab 06/30/14 0736  GLUCAP 104*    Radiological Exams on Admission: Mr Shirlee LatchMra Head Wo Contrast  06/30/2014   CLINICAL DATA:  Vertigo and ataxia. No indication about duration of symptoms.  EXAM: MRI HEAD WITHOUT CONTRAST  MRA HEAD WITHOUT CONTRAST  TECHNIQUE: Multiplanar, multiecho pulse sequences of the brain and surrounding structures were obtained without intravenous contrast. Angiographic images of the head were obtained using MRA technique without contrast.  COMPARISON:  None.  FINDINGS: MRI HEAD FINDINGS  Diffusion imaging does not show any acute or subacute infarction. The brainstem and cerebellum are normal. The cerebral hemispheres do not show accelerated atrophy. There are minimal small vessel changes of the white matter, less than often seen in healthy individuals of this age. No cortical or large vessel territory infarction. No mass lesion, hemorrhage, hydrocephalus or extra-axial collection. No pituitary mass. No fluid in the sinuses, middle ears or mastoids. No visible cranial nerve abnormality.  MRA HEAD FINDINGS  Both internal carotid arteries are widely patent into the brain. The anterior and middle cerebral vessels are patent without proximal stenosis, aneurysm or vascular malformation.  The left vertebral artery is a large vessel widely patent to the basilar. The right vertebral artery is a  small vessel that terminates in PICA. No basilar stenosis. Posterior circulation branch vessels appear normal.  IMPRESSION: No acute infarction. Minimal small vessel change of the cerebral hemispheric white matter, less than often seen in healthy individuals of this age. No specific cause of vertigo or ataxia is identified.  Negative intracranial MR angiography of the large and medium size vessels.   Electronically Signed   By: Paulina FusiMark  Shogry M.D.   On: 06/30/2014 10:57   Mr Brain Wo Contrast  06/30/2014   CLINICAL DATA:  Vertigo and ataxia. No indication about duration of symptoms.  EXAM: MRI HEAD WITHOUT CONTRAST  MRA HEAD WITHOUT CONTRAST  TECHNIQUE: Multiplanar, multiecho pulse sequences of the brain and surrounding structures were obtained without intravenous contrast. Angiographic  images of the head were obtained using MRA technique without contrast.  COMPARISON:  None.  FINDINGS: MRI HEAD FINDINGS  Diffusion imaging does not show any acute or subacute infarction. The brainstem and cerebellum are normal. The cerebral hemispheres do not show accelerated atrophy. There are minimal small vessel changes of the white matter, less than often seen in healthy individuals of this age. No cortical or large vessel territory infarction. No mass lesion, hemorrhage, hydrocephalus or extra-axial collection. No pituitary mass. No fluid in the sinuses, middle ears or mastoids. No visible cranial nerve abnormality.  MRA HEAD FINDINGS  Both internal carotid arteries are widely patent into the brain. The anterior and middle cerebral vessels are patent without proximal stenosis, aneurysm or vascular malformation.  The left vertebral artery is a large vessel widely patent to the basilar. The right vertebral artery is a small vessel that terminates in PICA. No basilar stenosis. Posterior circulation branch vessels appear normal.  IMPRESSION: No acute infarction. Minimal small vessel change of the cerebral hemispheric white matter,  less than often seen in healthy individuals of this age. No specific cause of vertigo or ataxia is identified.  Negative intracranial MR angiography of the large and medium size vessels.   Electronically Signed   By: Paulina Fusi M.D.   On: 06/30/2014 10:57    EKG: Independently reviewed. Sinus rhythm with no ST elevations or depressions  Assessment/Plan Active Problems:   Ataxia/Vertigo - Neurology involved - Workup pending - TIA order set placed - Telemetry monitoring - Troponins - UDS negative   Code Status: Presumed full DVT Prophylaxis: Heparin Family Communication: Discussed with son at bedside Disposition Plan: Pending further workup  Time spent: > 55 minutes  Penny Pia [facility] Pager [phone]

## 2014-07-01 DIAGNOSIS — I639 Cerebral infarction, unspecified: Secondary | ICD-10-CM

## 2014-07-01 DIAGNOSIS — M25511 Pain in right shoulder: Secondary | ICD-10-CM

## 2014-07-01 DIAGNOSIS — I369 Nonrheumatic tricuspid valve disorder, unspecified: Secondary | ICD-10-CM

## 2014-07-01 DIAGNOSIS — H8110 Benign paroxysmal vertigo, unspecified ear: Secondary | ICD-10-CM | POA: Diagnosis present

## 2014-07-01 DIAGNOSIS — I674 Hypertensive encephalopathy: Secondary | ICD-10-CM

## 2014-07-01 DIAGNOSIS — M25512 Pain in left shoulder: Secondary | ICD-10-CM

## 2014-07-01 DIAGNOSIS — F410 Panic disorder [episodic paroxysmal anxiety] without agoraphobia: Secondary | ICD-10-CM

## 2014-07-01 DIAGNOSIS — I10 Essential (primary) hypertension: Secondary | ICD-10-CM | POA: Diagnosis present

## 2014-07-01 LAB — RAPID URINE DRUG SCREEN, HOSP PERFORMED
AMPHETAMINES: NOT DETECTED
BENZODIAZEPINES: NOT DETECTED
Barbiturates: NOT DETECTED
Cocaine: NOT DETECTED
OPIATES: NOT DETECTED
TETRAHYDROCANNABINOL: NOT DETECTED

## 2014-07-01 LAB — HEMOGLOBIN A1C
Hgb A1c MFr Bld: 6.3 % — ABNORMAL HIGH (ref <5.7)
Mean Plasma Glucose: 134 mg/dL — ABNORMAL HIGH (ref <117)

## 2014-07-01 LAB — LIPID PANEL
CHOL/HDL RATIO: 4.2 ratio
Cholesterol: 159 mg/dL (ref 0–200)
HDL: 38 mg/dL — ABNORMAL LOW (ref >39)
LDL Cholesterol: 94 mg/dL (ref 0–99)
Triglycerides: 133 mg/dL (ref <150)
VLDL: 27 mg/dL (ref 0–40)

## 2014-07-01 LAB — GLUCOSE, CAPILLARY
Glucose-Capillary: 110 mg/dL — ABNORMAL HIGH (ref 70–99)
Glucose-Capillary: 97 mg/dL (ref 70–99)

## 2014-07-01 LAB — TROPONIN I: Troponin I: <0.03 ng/mL (ref <0.031)

## 2014-07-01 MED ORDER — HYDROCODONE-ACETAMINOPHEN 5-325 MG PO TABS
1.0000 | ORAL_TABLET | Freq: Four times a day (QID) | ORAL | Status: DC | PRN
  Administered 2014-07-01: 1 via ORAL
  Filled 2014-07-01: qty 1

## 2014-07-01 MED ORDER — ACETAMINOPHEN 325 MG PO TABS
650.0000 mg | ORAL_TABLET | Freq: Four times a day (QID) | ORAL | Status: DC | PRN
  Administered 2014-07-01: 650 mg via ORAL
  Filled 2014-07-01: qty 2

## 2014-07-01 MED ORDER — MECLIZINE HCL 50 MG PO TABS: 50.0000 mg | ORAL_TABLET | Freq: Three times a day (TID) | ORAL | Status: DC | PRN

## 2014-07-01 MED ORDER — PNEUMOCOCCAL VAC POLYVALENT 25 MCG/0.5ML IJ INJ
0.5000 mL | INJECTION | INTRAMUSCULAR | Status: AC
  Administered 2014-07-01: 0.5 mL via INTRAMUSCULAR
  Filled 2014-07-01: qty 0.5

## 2014-07-01 MED ORDER — ASPIRIN 81 MG PO TBEC: 81.0000 mg | DELAYED_RELEASE_TABLET | Freq: Every day | ORAL | Status: DC

## 2014-07-01 MED ORDER — TRAMADOL HCL 50 MG PO TABS
50.0000 mg | ORAL_TABLET | Freq: Four times a day (QID) | ORAL | Status: DC | PRN
  Filled 2014-07-01: qty 1

## 2014-07-01 NOTE — Discharge Summary (Signed)
Physician Discharge Summary  Brandon Clark YNW:[id number] DOB: 02-Feb-1941 DOA: 06/30/2014  PCP: Jeanann Lewandowsky, MD  Admit date: 06/30/2014 Discharge date: 07/01/2014  Time spent: 30 minutes  Recommendations for Outpatient Follow-up:   1. Symptoms likely secondary to vertigo, significantly improved on the following morning. He was discharged on PRN meclizine.    Discharge Diagnoses:  Principal Problem:   BPV (benign positional vertigo) Active Problems:   HTN (hypertension)   Bilateral shoulder pain   Ataxia   Discharge Condition: Stable  Diet recommendation: Heart Healthy  Filed Weights   07/01/14 1119  Weight: 113.853 kg (251 lb)    History of present illness:  Brandon Clark is an 73 y.o. male with a past medical history significant for HTN, hyperlipidemia, sleep apnea, brought in by police after being found driving erratically earlier this morning. Patient speaks arabic but his son is at the bedside and helps to translate. Son stated that he was last seen normal around midnight yesterday. Brandon Clark said that he did not sleep well last night but was doing well when he got into his car this morning, [place] 9 [place], started having a severe " spinning sensation and feeling anxious when the police found him driving erratically in the opposite side of the street. According to the police, when he got out of the car he was walking like a drunk person and was grabbing his chest. Patient denied associated HA, double vision, difficulty swallowing, slurred speech, language or visual impairment but said that the left leg feels heavy. He recall having " similar" symptoms when he had a panic attack many years ago. Upon arrival to the ED he was noted to have an ataxic gait without other associated neurological signs.  Hospital Course:  Patient is a pleasant 73 year old gentleman with a past medical history of hypertension,  dyslipidemia, was admitted to medicine service on 06/30/2014. He was found by local police driving erratically on the day of admission. Patient stated having spinning sensation and feeling anxious at the time he was driving erratically. Alcohol level and urine drug screen negative. Neurology was consulted. He had an MRI/MRA of the brain performed on 06/30/2014 which did not reveal acute infarction. No specific cause of vertigo/ataxia identified on MRI. He was further worked up with transthoracic echocardiogram which showed an ejection fraction of 50%, grade 1 diastolic dysfunction. Patient was seen and evaluated by neurology during this hospitalization. Symptoms appear to be most consistent with benign positional vertigo. Given significant improvement, as he was ambulating down the hallway without any issues, he was discharged to his home in stable condition on 07/01/2014. He was given a prescription for as needed meclizine.    Consultations:  Neurology  Discharge Exam: Filed Vitals:   07/01/14 1402  BP: 136/84  Pulse: 76  Temp: 97.8 F (36.6 C)  Resp: 18    General: Patient stated feeling better, ambulated down the hallway to the nurses station and back without any issues Cardiovascular: Regular rate and rhythm normal S1-S2 no murmurs rubs or gallops Respiratory: Lungs are clear to auscultation bilaterally no wheezing rhonchi or rales, normal respiratory effort Abdomen: Soft nontender nondistended positive bowel sounds Extremities: No edema  Discharge Instructions   Discharge Instructions    Call MD for:  difficulty breathing, headache or visual disturbances    Complete by:  As directed      Call MD for:  extreme fatigue    Complete by:  As directed  Call MD for:  hives    Complete by:  As directed      Call MD for:  persistant dizziness or light-headedness    Complete by:  As directed      Call MD for:  persistant nausea and vomiting    Complete by:  As directed      Call  MD for:  redness, tenderness, or signs of infection (pain, swelling, redness, odor or green/yellow discharge around incision site)    Complete by:  As directed      Call MD for:  severe uncontrolled pain    Complete by:  As directed      Call MD for:  temperature >100.4    Complete by:  As directed      Diet - low sodium heart healthy    Complete by:  As directed      Increase activity slowly    Complete by:  As directed           Current Discharge Medication List    START taking these medications   Details  aspirin EC 81 MG EC tablet Take 1 tablet (81 mg total) by mouth daily. Qty: 30 tablet, Refills: 0    meclizine (ANTIVERT) 50 MG tablet Take 1 tablet (50 mg total) by mouth 3 (three) times daily as needed for dizziness or nausea. Qty: 15 tablet, Refills: 0      CONTINUE these medications which have NOT CHANGED   Details  diclofenac (VOLTAREN) 75 MG EC tablet Take 1 tablet (75 mg total) by mouth 2 (two) times daily. Qty: 60 tablet, Refills: 1   Associated Diagnoses: Bilateral knee pain    hydrochlorothiazide (HYDRODIURIL) 12.5 MG tablet Take 1 tablet (12.5 mg total) by mouth daily. Qty: 30 tablet, Refills: 1   Associated Diagnoses: Essential hypertension    Hyoscyamine Sulfate 0.375 MG TBCR Take 1 tablet (0.375 mg total) by mouth 2 (two) times daily. One tab twice a day for 5 days then as needed, abdominal pain. Qty: 30 tablet, Refills: 1   Associated Diagnoses: Gastritis and gastroduodenitis    ibuprofen (ADVIL,MOTRIN) 200 MG tablet Take 400 mg by mouth every 6 (six) hours as needed for mild pain.    simvastatin (ZOCOR) 10 MG tablet Take 1 tablet (10 mg total) by mouth at bedtime. Qty: 90 tablet, Refills: 3   Associated Diagnoses: HLD (hyperlipidemia)    nitroGLYCERIN (NITRODUR - DOSED IN MG/24 HR) 0.2 mg/hr patch 1/4th patch over affected shoulder, change daily Qty: 30 patch, Refills: 1   Associated Diagnoses: Bilateral knee pain    pantoprazole (PROTONIX) 40 MG  tablet Take 1 tablet (40 mg total) by mouth daily. Qty: 30 tablet, Refills: 3   Associated Diagnoses: Gastroesophageal reflux disease, esophagitis presence not specified    traMADol (ULTRAM) 50 MG tablet Take 1 tablet (50 mg total) by mouth every 8 (eight) hours as needed. Qty: 30 tablet, Refills: 0   Associated Diagnoses: Pain due to dental caries       No Known Allergies Follow-up Information    Follow up with JEGEDE, OLUGBEMIGA, MD In 2 weeks.   Specialty:  Internal Medicine   Contact information:   [address] [place] [address] [phone]        The results of significant diagnostics from this hospitalization (including imaging, microbiology, ancillary and laboratory) are listed below for reference.    Significant Diagnostic Studies: Brandon Clark Wo Contrast  06/30/2014   CLINICAL DATA:  Vertigo and  ataxia. No indication about duration of symptoms.  EXAM: MRI HEAD WITHOUT CONTRAST  MRA HEAD WITHOUT CONTRAST  TECHNIQUE: Multiplanar, multiecho pulse sequences of the brain and surrounding structures were obtained without intravenous contrast. Angiographic images of the head were obtained using MRA technique without contrast.  COMPARISON:  None.  FINDINGS: MRI HEAD FINDINGS  Diffusion imaging does not show any acute or subacute infarction. The brainstem and cerebellum are normal. The cerebral hemispheres do not show accelerated atrophy. There are minimal small vessel changes of the white matter, less than often seen in healthy individuals of this age. No cortical or large vessel territory infarction. No mass lesion, hemorrhage, hydrocephalus or extra-axial collection. No pituitary mass. No fluid in the sinuses, middle ears or mastoids. No visible cranial nerve abnormality.  MRA HEAD FINDINGS  Both internal carotid arteries are widely patent into the brain. The anterior and middle cerebral vessels are patent without proximal stenosis, aneurysm or vascular malformation.  The left  vertebral artery is a large vessel widely patent to the basilar. The right vertebral artery is a small vessel that terminates in PICA. No basilar stenosis. Posterior circulation branch vessels appear normal.  IMPRESSION: No acute infarction. Minimal small vessel change of the cerebral hemispheric white matter, less than often seen in healthy individuals of this age. No specific cause of vertigo or ataxia is identified.  Negative intracranial Brandon angiography of the large and medium size vessels.   Electronically Signed   By: Paulina FusiMark  Shogry M.D.   On: 06/30/2014 10:57   Brandon Brain Wo Contrast  06/30/2014   CLINICAL DATA:  Vertigo and ataxia. No indication about duration of symptoms.  EXAM: MRI HEAD WITHOUT CONTRAST  MRA HEAD WITHOUT CONTRAST  TECHNIQUE: Multiplanar, multiecho pulse sequences of the brain and surrounding structures were obtained without intravenous contrast. Angiographic images of the head were obtained using MRA technique without contrast.  COMPARISON:  None.  FINDINGS: MRI HEAD FINDINGS  Diffusion imaging does not show any acute or subacute infarction. The brainstem and cerebellum are normal. The cerebral hemispheres do not show accelerated atrophy. There are minimal small vessel changes of the white matter, less than often seen in healthy individuals of this age. No cortical or large vessel territory infarction. No mass lesion, hemorrhage, hydrocephalus or extra-axial collection. No pituitary mass. No fluid in the sinuses, middle ears or mastoids. No visible cranial nerve abnormality.  MRA HEAD FINDINGS  Both internal carotid arteries are widely patent into the brain. The anterior and middle cerebral vessels are patent without proximal stenosis, aneurysm or vascular malformation.  The left vertebral artery is a large vessel widely patent to the basilar. The right vertebral artery is a small vessel that terminates in PICA. No basilar stenosis. Posterior circulation branch vessels appear normal.   IMPRESSION: No acute infarction. Minimal small vessel change of the cerebral hemispheric white matter, less than often seen in healthy individuals of this age. No specific cause of vertigo or ataxia is identified.  Negative intracranial Brandon angiography of the large and medium size vessels.   Electronically Signed   By: Paulina FusiMark  Shogry M.D.   On: 06/30/2014 10:57    Microbiology: No results found for this or any previous visit (from the past 240 hour(s)).   Labs: Basic Metabolic Panel:  Recent Labs Lab 06/30/14 0820 06/30/14 0848  NA 137 139  K 4.0 3.8  CL 104 101  CO2 26  --   GLUCOSE 121* 119*  BUN 14 16  CREATININE 1.18 1.10  CALCIUM 9.1  --    Liver Function Tests:  Recent Labs Lab 06/30/14 0820  AST 19  ALT 15  ALKPHOS 55  BILITOT 0.3  PROT 6.5  ALBUMIN 3.8   No results for input(s): LIPASE, AMYLASE in the last 168 hours. No results for input(s): AMMONIA in the last 168 hours. CBC:  Recent Labs Lab 06/30/14 0820 06/30/14 0848  WBC 4.0  --   NEUTROABS 1.4*  --   HGB 16.9 18.0*  HCT 48.5 53.0*  MCV 86.0  --   PLT 204  --    Cardiac Enzymes:  Recent Labs Lab 06/30/14 1513 06/30/14 2042 07/01/14 0638  TROPONINI 0.04* 0.03 <0.03   BNP: BNP (last 3 results) No results for input(s): PROBNP in the last 8760 hours. CBG:  Recent Labs Lab 06/30/14 0736 06/30/14 1809 06/30/14 2249 07/01/14 0630 07/01/14 1122  GLUCAP 104* 153* 95 110* 97       Signed:  Davon Abdelaziz  [facility] 07/01/2014, 2:32 PM

## 2014-07-01 NOTE — Progress Notes (Signed)
D/C orders received. Pt and son educated on d/c instructions and stroke education. Verbalized understanding. Pt handed d/c packet and prescription. IV and tele removed. Pt walked with staff downstairs.

## 2014-07-01 NOTE — Progress Notes (Signed)
STROKE TEAM PROGRESS NOTE   HISTORY Brandon Clark is an 73 y.o. male with a past medical history significant for HTN, hyperlipidemia, sleep apnea, brought in by police after being found driving erratically earlier this morning 06/30/2014. Patient speaks arabic but his son is at the bedside and helps to translate. Son stated that he was last seen normal around midnight yesterday (LKW 06/04/2014 0001). Brandon Clark said that he did not sleep well last night but was doing well when he got into his car this morning, [place] approximately 9 [place] go [place], started having a severe " spinning sensation and feeling anxious when the police found him driving erratically in the opposite side of the street. According to the police, when he got out of the car he was walking like a drunk person and was grabbing his chest. Patient denied associated HA, double vision, difficulty swallowing, slurred speech, language or visual impairment but said that the left leg feels heavy. He recall having " similar" symptoms when he had a panic attack many years ago. Upon arrival to the ED he was noted to have an ataxic gait without other associated neurological signs. NIHSS 0. Patient was not administered TPA secondary to out of the window, NIHSS 0. He was admitted for further evaluation and treatment.   SUBJECTIVE (INTERVAL HISTORY) His son is at the bedside.  Patient does not speak [place]. Son is interpreting for Dr. Roda ShuttersXu. Overall he feels his condition is completely resolved. Patient reports he was very anxious when the cops got him, then felt dizzy and unsteady on feet. He states he was driving, looking for the roads, want to turn but a car was there. Once the car moved, he turned. That is when the cops got him (he said there were a lot of cop cars), he was scared and anxious, and felt lightheadedness and unsteady but no room spinning. He is very active at baseline. Goes to the gym regularly. No significant  medical hx per son. Glad he does not have a stroke.  OBJECTIVE Temp:  [97.5 F (36.4 C)-98.4 F (36.9 C)] 97.8 F (36.6 C) (12/29 1402) Pulse Rate:  [70-88] 76 (12/29 1402) Cardiac Rhythm:  [-] Normal sinus rhythm (12/29 0900) Resp:  [18-20] 18 (12/29 1402) BP: (110-140)/(80-103) 136/84 mmHg (12/29 1402) SpO2:  [91 %-97 %] 96 % (12/29 1402) Weight:  [251 lb (113.853 kg)] 251 lb (113.853 kg) (12/29 1119)   Recent Labs Lab 06/30/14 0736 06/30/14 1809 06/30/14 2249 07/01/14 0630 07/01/14 1122  GLUCAP 104* 153* 95 110* 97    Recent Labs Lab 06/30/14 0820 06/30/14 0848  NA 137 139  K 4.0 3.8  CL 104 101  CO2 26  --   GLUCOSE 121* 119*  BUN 14 16  CREATININE 1.18 1.10  CALCIUM 9.1  --     Recent Labs Lab 06/30/14 0820  AST 19  ALT 15  ALKPHOS 55  BILITOT 0.3  PROT 6.5  ALBUMIN 3.8    Recent Labs Lab 06/30/14 0820 06/30/14 0848  WBC 4.0  --   NEUTROABS 1.4*  --   HGB 16.9 18.0*  HCT 48.5 53.0*  MCV 86.0  --   PLT 204  --     Recent Labs Lab 06/30/14 1513 06/30/14 2042 07/01/14 0638  TROPONINI 0.04* 0.03 <0.03    Recent Labs  06/30/14 0820  LABPROT 13.5  INR 1.01    Recent Labs  06/30/14 0943  COLORURINE YELLOW  LABSPEC 1.012  PHURINE 5.0  GLUCOSEU NEGATIVE  HGBUR TRACE*  BILIRUBINUR NEGATIVE  KETONESUR NEGATIVE  PROTEINUR NEGATIVE  UROBILINOGEN 0.2  NITRITE NEGATIVE  LEUKOCYTESUR NEGATIVE       Component Value Date/Time   CHOL 159 07/01/2014 0638   TRIG 133 07/01/2014 0638   HDL 38* 07/01/2014 0638   CHOLHDL 4.2 07/01/2014 0638   VLDL 27 07/01/2014 0638   LDLCALC 94 07/01/2014 0638   Lab Results  Component Value Date   HGBA1C 6.3* 06/30/2014      Component Value Date/Time   LABOPIA NONE DETECTED 07/01/2014 0232   COCAINSCRNUR NONE DETECTED 07/01/2014 0232   LABBENZ NONE DETECTED 07/01/2014 0232   AMPHETMU NONE DETECTED 07/01/2014 0232   THCU NONE DETECTED 07/01/2014 0232   LABBARB NONE DETECTED 07/01/2014 0232      Recent Labs Lab 06/30/14 0820  ETH <5   I have personally reviewed the radiological images below and agree with the radiology interpretations.  Mra Head Wo Contrast  06/30/2014    Negative intracranial Brandon angiography of the large and medium size vessels.     Mri Head Wo Contrast  06/30/2014    No acute infarction. Minimal small vessel change of the cerebral hemispheric white matter, less than often seen in healthy individuals of this age. No specific cause of vertigo or ataxia is identified.    CUS - Bilateral: 1-39% ICA stenosis. Vertebral artery flow is antegrade.  2D echo - - Procedure narrative: Transthoracic echocardiography. Image quality was adequate. The study was technically difficult. - Left ventricle: Systolic function was normal. The estimated ejection fraction was in the range of 50% to 55%. Wall motion was normal; there were no regional wall motion abnormalities. Doppler parameters are consistent with abnormal left ventricular relaxation (grade 1 diastolic dysfunction). - Right atrium: The atrium was mildly dilated. - Tricuspid valve: There was moderate regurgitation.  Impressions:  - No cardiac source of emboli was indentified.   PHYSICAL EXAM  Temp:  [97.5 F (36.4 C)-98.4 F (36.9 C)] 97.8 F (36.6 C) (12/29 1402) Pulse Rate:  [70-88] 76 (12/29 1402) Resp:  [18-20] 18 (12/29 1402) BP: (110-140)/(80-103) 136/84 mmHg (12/29 1402) SpO2:  [91 %-97 %] 96 % (12/29 1402) Weight:  [251 lb (113.853 kg)] 251 lb (113.853 kg) (12/29 1119)  General - Well nourished, well developed, in no apparent distress.  Ophthalmologic - Sharp disc margins OU.  Cardiovascular - Regular rate and rhythm with no murmur.  Mental Status -  Level of arousal and orientation to time, place, and person were intact. Language including expression, naming, repetition, comprehension, reading, and writing was assessed and found intact.  Cranial Nerves II - XII - II -  Visual field intact OU. III, IV, VI - Extraocular movements intact. V - Facial sensation intact bilaterally. VII - Facial movement intact bilaterally. VIII - Hearing & vestibular intact bilaterally. X - Palate elevates symmetrically. XI - Chin turning & shoulder shrug intact bilaterally. XII - Tongue protrusion intact.  Motor Strength - The patient's strength was normal in all extremities and pronator drift was absent.  Bulk was normal and fasciculations were absent.   Motor Tone - Muscle tone was assessed at the neck and appendages and was normal.  Reflexes - The patient's reflexes were normal in all extremities and he had no pathological reflexes.  Sensory - Light touch, temperature/pinprick were assessed and were normal.    Coordination - The patient had normal movements in the hands and feet with no ataxia or dysmetria.  Tremor  was absent.  Gait and Station - The patient's transfers, posture, gait, station, and turns were observed as normal.   ASSESSMENT/PLAN Brandon Clark is a 73 y.o. male with history of hypertension presenting with vertigo, ataxia. He did not receive IV t-PA due to delay in arrival, NIHSS 0.   Anxiety / Panic Attack  Resultant  No neuro deficits  MRI  No acute stroke  MRA  Unremarkable   Carotid Doppler  unremarkable   2D Echo  unremarkable  UDS neg  HgbA1c 6.3  Heparin 5000 units sq tid for VTE prophylaxis Diet Heart  Diet - low sodium heart healthy thin liquids  no antithrombotic prior to admission, now on aspirin 81 mg orally every day. No indication for long-term antiplatelets from stroke standpoint  Ongoing aggressive stroke risk factor management  Therapy recommendations:  No therapy needs anticipated due to complete resolution of symptoms.  Disposition:  Anticipate return home  No neurologic follow up needed.  Hypertension  BP 110-152/82-108 past 24h (07/01/2014 @ 3:48 PM). Highest on arrival. Has decreased to normal  range.   Stable  Home meds with HCTZ  Hyperlipidemia  Home meds:  zocor 10, resumed in hospital  LDL 94, goal < 100  Continue statin at discharge  Other Stroke Risk Factors  Advanced age  Obstructive sleep apnea, on CPAP at home  Hospital day # 1  [facility] See Amion for Pager information 07/01/2014 10:45 AM   I, the attending vascular neurologist, have personally obtained a history, examined the patient, evaluated laboratory data, individually viewed imaging studies and agree with radiology interpretations. Together with the NP/PA, we formulated the assessment and plan of care which reflects our mutual decision.  I have made any additions or clarifications directly to the above note and agree with the findings and plan as currently documented.   73 yo M with hx of HTN and OSA was admitted after was caught by police due to erratic driving but actually he was looking for road and difficulty with lanes. He then got anxious and panic attack with cops. I suspect pt may have hypertensive urgency or emergency or encephalopathy during that time causing his lightheadedness with unsteady on feet. Stroke work up negative and neurology will sign off.   Marvel Plan, MD PhD Stroke Neurology 07/01/2014 3:51 PM       To contact Stroke Continuity provider, please refer to [email or web address]. After hours, contact General Neurology

## 2014-07-01 NOTE — Progress Notes (Signed)
UR completed 

## 2014-07-01 NOTE — Progress Notes (Signed)
VASCULAR LAB PRELIMINARY  PRELIMINARY  PRELIMINARY  PRELIMINARY  Carotid duplex  completed.    Preliminary report:  Bilateral:  1-39% ICA stenosis.  Vertebral artery flow is antegrade.      Festus Pursel, RVT 07/01/2014, 2:42 PM

## 2014-07-01 NOTE — Progress Notes (Signed)
  Echocardiogram 2D Echocardiogram has been performed.  Janalyn HarderWest, Rhealynn Myhre R 07/01/2014, 9:43 AM

## 2014-07-02 ENCOUNTER — Telehealth: Payer: Self-pay

## 2014-07-02 NOTE — Telephone Encounter (Signed)
Patient hospitalized for benign positional vertigo after found driving erractically by police on 06/30/14. Discharged on PRN meclizine. F/U with PCP recommended within two weeks. Placed call to patient to follow-up with patient and discuss need for follow-up appointment.  Unable to reach patient; voicemail left for patient.  [employer] used for telephonic interpreting services.

## 2014-07-10 ENCOUNTER — Ambulatory Visit: Payer: No Typology Code available for payment source | Attending: Internal Medicine | Admitting: Internal Medicine

## 2014-07-10 ENCOUNTER — Encounter: Payer: Self-pay | Admitting: Internal Medicine

## 2014-07-10 VITALS — BP 140/98 | HR 98 | Temp 98.0°F | Resp 16 | Ht 72.0 in | Wt 248.0 lb

## 2014-07-10 DIAGNOSIS — H8110 Benign paroxysmal vertigo, unspecified ear: Secondary | ICD-10-CM

## 2014-07-10 DIAGNOSIS — Z7982 Long term (current) use of aspirin: Secondary | ICD-10-CM | POA: Insufficient documentation

## 2014-07-10 DIAGNOSIS — Z79899 Other long term (current) drug therapy: Secondary | ICD-10-CM | POA: Insufficient documentation

## 2014-07-10 DIAGNOSIS — K219 Gastro-esophageal reflux disease without esophagitis: Secondary | ICD-10-CM | POA: Insufficient documentation

## 2014-07-10 DIAGNOSIS — R42 Dizziness and giddiness: Secondary | ICD-10-CM | POA: Insufficient documentation

## 2014-07-10 DIAGNOSIS — G4730 Sleep apnea, unspecified: Secondary | ICD-10-CM | POA: Insufficient documentation

## 2014-07-10 DIAGNOSIS — I10 Essential (primary) hypertension: Secondary | ICD-10-CM | POA: Insufficient documentation

## 2014-07-10 DIAGNOSIS — E785 Hyperlipidemia, unspecified: Secondary | ICD-10-CM | POA: Insufficient documentation

## 2014-07-10 MED ORDER — MECLIZINE HCL 50 MG PO TABS: 25.0000 mg | ORAL_TABLET | Freq: Three times a day (TID) | ORAL | Status: DC | PRN

## 2014-07-10 NOTE — Progress Notes (Signed)
Patient is a pleasant 74 year old gentleman with a past medical history of hypertension, dyslipidemia, was admitted to medicine service on 06/30/2014. He was found by local police driving erratically on the day of admission. Patient stated having spinning sensation and feeling anxious at the time he was driving erratically. Alcohol level and urine drug screen negative. Neurology was consulted. He had an MRI/MRA of the brain performed on 06/30/2014 which did not reveal acute infarction. No specific cause of vertigo/ataxia identified on MRI. He was further worked up with transthoracic echocardiogram which showed an ejection fraction of 50%, grade 1 diastolic dysfunction. Patient was seen and evaluated by neurology during this hospitalization. Symptoms appear to be most consistent with benign positional vertigo.  He was dc'd home with a prescription for meclizine  Patient did not fill prescription Pt. Had flu shot Pt. Took meds this am

## 2014-07-10 NOTE — Progress Notes (Signed)
Patient ID: Brandon Clark, male   DOB: [birth year], 74 y.o.   MRN: [id number]  CC: HFU  HPI: Brandon Clark is a 74 y.o. male here today for a follow up visit.  Patient has past medical history of HTN, hyperlipidemia, sleep apnea, and GERD.  He was hospitalized on 12/28 for dizziness.  He was taken to the ER by police after being found driving erratically.  He was admitted and worked up for stroke like symptoms.  All test were negative and the patient was discharged home with a diagnoses of Benign Positional Vertigo.  Echo revealed a EF of 50%.  He has not filled the prescription Meclizine. He reports some dizziness since hospital discharge with ringing in his ears.     Patient has No headache, No chest pain, No new weakness tingling or numbness, No Cough - SOB.  No Known Allergies Past Medical History  Diagnosis Date  . Acid reflux   . Ulcer   . Sleep apnea   . Hypertension   . Hyperlipidemia    Current Outpatient Prescriptions on File Prior to Visit  Medication Sig Dispense Refill  . aspirin EC 81 MG EC tablet Take 1 tablet (81 mg total) by mouth daily. 30 tablet 0  . diclofenac (VOLTAREN) 75 MG EC tablet Take 1 tablet (75 mg total) by mouth 2 (two) times daily. 60 tablet 1  . hydrochlorothiazide (HYDRODIURIL) 12.5 MG tablet Take 1 tablet (12.5 mg total) by mouth daily. 30 tablet 1  . Hyoscyamine Sulfate 0.375 MG TBCR Take 1 tablet (0.375 mg total) by mouth 2 (two) times daily. One tab twice a day for 5 days then as needed, abdominal pain. 30 tablet 1  . ibuprofen (ADVIL,MOTRIN) 200 MG tablet Take 400 mg by mouth every 6 (six) hours as needed for mild pain.    . nitroGLYCERIN (NITRODUR - DOSED IN MG/24 HR) 0.2 mg/hr patch 1/4th patch over affected shoulder, change daily 30 patch 1  . pantoprazole (PROTONIX) 40 MG tablet Take 1 tablet (40 mg total) by mouth daily. 30 tablet 3  . simvastatin (ZOCOR) 10 MG tablet Take 1 tablet (10 mg total) by mouth at bedtime. 90 tablet 3  .  traMADol (ULTRAM) 50 MG tablet Take 1 tablet (50 mg total) by mouth every 8 (eight) hours as needed. 30 tablet 0  . meclizine (ANTIVERT) 50 MG tablet Take 1 tablet (50 mg total) by mouth 3 (three) times daily as needed for dizziness or nausea. (Patient not taking: Reported on 07/10/2014) 15 tablet 0   No current facility-administered medications on file prior to visit.   Family History  Problem Relation Age of Onset  . Diabetes Mother   . Diabetes Brother   . Heart attack Neg Hx   . Hyperlipidemia Neg Hx   . Hypertension Neg Hx   . Sudden death Neg Hx    History   Social History  . Marital Status: Married    Spouse Name: N/A    Number of Children: 7  . Years of Education: N/A   Occupational History  . Engineer    Social History Main Topics  . Smoking status: Never Smoker   . Smokeless tobacco: Never Used  . Alcohol Use: No  . Drug Use: No  . Sexual Activity: No   Other Topics Concern  . Not on file   Social History Narrative    Review of Systems  HENT: Positive for tinnitus.   Gastrointestinal: Positive for heartburn, nausea, vomiting and  abdominal pain. Negative for diarrhea and constipation.  Neurological: Positive for dizziness. Negative for sensory change, speech change, focal weakness, seizures and loss of consciousness.  All other systems reviewed and are negative.      Objective:   Filed Vitals:   07/10/14 1532  BP: 140/98  Pulse:   Temp:   Resp:     Physical Exam  Constitutional: He is oriented to person, place, and time.  HENT:  Right Ear: External ear normal.  Left Ear: External ear normal.  Eyes: EOM are normal. Pupils are equal, round, and reactive to light.  Neck: Normal range of motion.  Cardiovascular: Normal rate, regular rhythm and normal heart sounds.   Pulmonary/Chest: Effort normal and breath sounds normal.  Abdominal: Soft. Bowel sounds are normal. He exhibits distension. There is no tenderness.  Neurological: He is alert and  oriented to person, place, and time. No cranial nerve deficit. Coordination normal.     Lab Results  Component Value Date   WBC 4.0 06/30/2014   HGB 18.0* 06/30/2014   HCT 53.0* 06/30/2014   MCV 86.0 06/30/2014   PLT 204 06/30/2014   Lab Results  Component Value Date   CREATININE 1.10 06/30/2014   BUN 16 06/30/2014   NA 139 06/30/2014   K 3.8 06/30/2014   CL 101 06/30/2014   CO2 26 06/30/2014    Lab Results  Component Value Date   HGBA1C 6.3* 06/30/2014   Lipid Panel     Component Value Date/Time   CHOL 159 07/01/2014 0638   TRIG 133 07/01/2014 0638   HDL 38* 07/01/2014 0638   CHOLHDL 4.2 07/01/2014 0638   VLDL 27 07/01/2014 0638   LDLCALC 94 07/01/2014 0638       Assessment and plan:   [name] was seen today for hospitalization follow-up and hypertension.  Diagnoses and associated orders for this visit:  BPV (benign positional vertigo), unspecified laterality - meclizine (ANTIVERT) 50 MG tablet; Take 0.5 tablets (25 mg total) by mouth 3 (three) times daily as needed for dizziness or nausea. Explained signs and symptoms that should warrant immediate attention.  Patient verbalized understanding with teach back used.  Due to language barrier, an interpreter was present during the history-taking and subsequent discussion (and for part of the physical exam) with this patient.  Return in about 3 months (around 10/09/2014) for Hypertension.       Holland CommonsKECK, Quynh Basso, NP-C [facility] [phone] 07/10/2014, 3:37 PM

## 2014-07-10 NOTE — Patient Instructions (Signed)
Benign Positional Vertigo Vertigo means you feel like you or your surroundings are moving when they are not. Benign positional vertigo is the most common form of vertigo. Benign means that the cause of your condition is not serious. Benign positional vertigo is more common in older adults. CAUSES  Benign positional vertigo is the result of an upset in the labyrinth system. This is an area in the middle ear that helps control your balance. This may be caused by a viral infection, head injury, or repetitive motion. However, often no specific cause is found. SYMPTOMS  Symptoms of benign positional vertigo occur when you move your head or eyes in different directions. Some of the symptoms may include:  Loss of balance and falls.  Vomiting.  Blurred vision.  Dizziness.  Nausea.  Involuntary eye movements (nystagmus). DIAGNOSIS  Benign positional vertigo is usually diagnosed by physical exam. If the specific cause of your benign positional vertigo is unknown, your caregiver may perform imaging tests, such as magnetic resonance imaging (MRI) or computed tomography (CT). TREATMENT  Your caregiver may recommend movements or procedures to correct the benign positional vertigo. Medicines such as meclizine, benzodiazepines, and medicines for nausea may be used to treat your symptoms. In rare cases, if your symptoms are caused by certain conditions that affect the inner ear, you may need surgery. HOME CARE INSTRUCTIONS   Follow your caregiver's instructions.  Move slowly. Do not make sudden body or head movements.  Avoid driving.  Avoid operating heavy machinery.  Avoid performing any tasks that would be dangerous to you or others during a vertigo episode.  Drink enough fluids to keep your urine clear or pale yellow. SEEK IMMEDIATE MEDICAL CARE IF:   You develop problems with walking, weakness, numbness, or using your arms, hands, or legs.  You have difficulty speaking.  You develop  severe headaches.  Your nausea or vomiting continues or gets worse.  You develop visual changes.  Your family or friends notice any behavioral changes.  Your condition gets worse.  You have a fever.  You develop a stiff neck or sensitivity to light. MAKE SURE YOU:   Understand these instructions.  Will watch your condition.  Will get help right away if you are not doing well or get worse. Document Released: 03/28/2006 Document Revised: 09/12/2011 Document Reviewed: 03/10/2011 ExitCare Patient Information 2015 [employer]. This information is not intended to replace advice given to you by your health care provider. Make sure you discuss any questions you have with your health care provider.    

## 2014-07-23 ENCOUNTER — Encounter: Payer: Self-pay | Admitting: Gastroenterology

## 2014-07-23 ENCOUNTER — Ambulatory Visit (INDEPENDENT_AMBULATORY_CARE_PROVIDER_SITE_OTHER): Payer: No Typology Code available for payment source | Admitting: Gastroenterology

## 2014-07-23 VITALS — BP 140/90 | HR 90 | Ht 72.0 in | Wt 249.0 lb

## 2014-07-23 DIAGNOSIS — R109 Unspecified abdominal pain: Secondary | ICD-10-CM

## 2014-07-23 DIAGNOSIS — R1013 Epigastric pain: Secondary | ICD-10-CM

## 2014-07-23 NOTE — Progress Notes (Signed)
      History of Present Illness:  Mr.  Bellowl Shabasy has returned for follow-up of abdominal pain.  Upper endoscopy demonstrated gastritis.  He has stopped Ultram and reports improvement in abdominal pain.  His main complaint is postprandial abdominal discomfort and distention.  He denies nausea except for occasionally in the early morning    Review of Systems: Pertinent positive and negative review of systems were noted in the above HPI section. All other review of systems were otherwise negative.    Current Medications, Allergies, Past Medical History, Past Surgical History, Family History and Social History were reviewed in [facility] electronic medical record  Vital signs were reviewed in today's medical record. Physical Exam: General: Well developed , well nourished, no acute distress On abdominal exam moderate tenderness in the midepigastrium that increases with abdominal muscle wall flexion there is no guarding or rebound  See Assessment and Plan under Problem List

## 2014-07-23 NOTE — Patient Instructions (Signed)
You have been scheduled for a gastric emptying scan at [facility] on 08/06/2014 at 9:30am. Please arrive at least 15 minutes prior to your appointment for registration. Please make certain not to have anything to eat or drink after midnight the night before your test. Hold all stomach medications (ex: Zofran, phenergan, Reglan) 48 hours prior to your test. If you need to reschedule your appointment, please contact radiology scheduling at [phone]. _____________________________________________________________________ A gastric-emptying study measures how long it takes for food to move through your stomach. There are several ways to measure stomach emptying. In the most common test, you eat food that contains a small amount of radioactive material. A scanner that detects the movement of the radioactive material is placed over your abdomen to monitor the rate at which food leaves your stomach. This test normally takes about 2 hours to complete. _____________________________________________________________________  Your Follow up appointment is scheduled on 09/16/2014 2:15pm with Dr Arlyce DiceKAplan

## 2014-07-23 NOTE — Assessment & Plan Note (Signed)
Patient has both dyspepsia and abdominal discomfort which I believe is due to abdominal wall pain.  The latter is clearly exacerbated by distention.  Gastroparesis should be ruled out.  In addition, abdominal discomfort is improved with hyomax.  Recommendations #1 continue to hold NSAIDs #2 gastric emptying scan #3 hyomax as needed

## 2014-08-06 ENCOUNTER — Ambulatory Visit (HOSPITAL_COMMUNITY)
Admission: RE | Admit: 2014-08-06 | Discharge: 2014-08-06 | Disposition: A | Discharge status: Home / Self Care | Payer: MEDICAID | Source: Ambulatory Visit | Attending: Gastroenterology | Admitting: Gastroenterology

## 2014-08-06 DIAGNOSIS — R109 Unspecified abdominal pain: Secondary | ICD-10-CM | POA: Insufficient documentation

## 2014-08-06 DIAGNOSIS — R140 Abdominal distension (gaseous): Secondary | ICD-10-CM | POA: Insufficient documentation

## 2014-08-06 DIAGNOSIS — E785 Hyperlipidemia, unspecified: Secondary | ICD-10-CM | POA: Insufficient documentation

## 2014-08-06 DIAGNOSIS — K219 Gastro-esophageal reflux disease without esophagitis: Secondary | ICD-10-CM | POA: Insufficient documentation

## 2014-08-06 DIAGNOSIS — R112 Nausea with vomiting, unspecified: Secondary | ICD-10-CM | POA: Insufficient documentation

## 2014-08-06 DIAGNOSIS — I10 Essential (primary) hypertension: Secondary | ICD-10-CM | POA: Insufficient documentation

## 2014-08-06 MED ORDER — TECHNETIUM TC 99M SULFUR COLLOID
2.2000 | Freq: Once | INTRAVENOUS | Status: AC | PRN
  Administered 2014-08-06: 2.2 via INTRAVENOUS

## 2014-08-07 NOTE — Progress Notes (Signed)
Quick Note:  Please inform the patient that GES was normal and to continue current plan of action OV 4-6 weeks ______

## 2014-09-12 ENCOUNTER — Telehealth: Payer: Self-pay | Admitting: Internal Medicine

## 2014-09-12 ENCOUNTER — Other Ambulatory Visit: Payer: Self-pay | Admitting: Internal Medicine

## 2014-09-12 NOTE — Telephone Encounter (Signed)
A couple of months ago the patient was referred to a sleep apnia specialist and the doctor decided to have him on the machine for oxygen but he never received it. Please follow up with patients about his next steps to be able to get this machine. Pt has the orange card and the cone discount.

## 2014-09-16 ENCOUNTER — Ambulatory Visit (INDEPENDENT_AMBULATORY_CARE_PROVIDER_SITE_OTHER): Payer: No Typology Code available for payment source | Admitting: Gastroenterology

## 2014-09-16 ENCOUNTER — Encounter: Payer: Self-pay | Admitting: Gastroenterology

## 2014-09-16 VITALS — BP 140/70 | HR 84 | Ht 71.5 in | Wt 247.5 lb

## 2014-09-16 DIAGNOSIS — R1013 Epigastric pain: Secondary | ICD-10-CM

## 2014-09-16 DIAGNOSIS — K2970 Gastritis, unspecified, without bleeding: Secondary | ICD-10-CM

## 2014-09-16 DIAGNOSIS — K2990 Gastroduodenitis, unspecified, without bleeding: Secondary | ICD-10-CM

## 2014-09-16 DIAGNOSIS — K429 Umbilical hernia without obstruction or gangrene: Secondary | ICD-10-CM

## 2014-09-16 MED ORDER — HYOSCYAMINE SULFATE ER 0.375 MG PO TBCR: EXTENDED_RELEASE_TABLET | ORAL | Status: DC

## 2014-09-16 MED ORDER — PANTOPRAZOLE SODIUM 40 MG PO TBEC: 40.0000 mg | DELAYED_RELEASE_TABLET | Freq: Every day | ORAL | Status: DC

## 2014-09-16 NOTE — Patient Instructions (Signed)
We have sent the following medications to your pharmacy for you to pick up at your convenience: Hyoscyamine pantoprazole

## 2014-09-16 NOTE — Progress Notes (Signed)
      History of Present Illness:  [name] currently is feeling well without abdominal pain.  He did have 2 episodes of vomiting since stopping hyomax and Protonix.  He has requested a renewal of these medicines.  He  also noticed some protuberance of his umbilicus with straining.  He also claims his abdomen is slightly distended.    Review of Systems: Pertinent positive and negative review of systems were noted in the above HPI section. All other review of systems were otherwise negative.    Current Medications, Allergies, Past Medical History, Past Surgical History, Family History and Social History were reviewed in [facility] electronic medical record  Vital signs were reviewed in today's medical record. Physical Exam: General: Well developed , well nourished, no acute distress On abdominal exam there is a very small reducible umbilical hernia.  There are no abdominal masses organomegaly.  There is no obvious ascites  See Assessment and Plan under Problem List

## 2014-09-16 NOTE — Assessment & Plan Note (Signed)
Patient has nonspecific dyspepsia which seems to have responded to hyomax and Protonix.  Patient will take these medicines as needed

## 2014-09-16 NOTE — Assessment & Plan Note (Signed)
Small, asymptomatic 

## 2014-09-24 ENCOUNTER — Encounter: Payer: Self-pay | Admitting: Internal Medicine

## 2014-09-24 ENCOUNTER — Ambulatory Visit: Payer: No Typology Code available for payment source | Attending: Internal Medicine | Admitting: Internal Medicine

## 2014-09-24 VITALS — BP 123/88 | HR 99 | Temp 97.8°F | Resp 16 | Ht 74.8 in | Wt 249.1 lb

## 2014-09-24 DIAGNOSIS — M25511 Pain in right shoulder: Secondary | ICD-10-CM | POA: Insufficient documentation

## 2014-09-24 DIAGNOSIS — I10 Essential (primary) hypertension: Secondary | ICD-10-CM | POA: Insufficient documentation

## 2014-09-24 DIAGNOSIS — E785 Hyperlipidemia, unspecified: Secondary | ICD-10-CM | POA: Insufficient documentation

## 2014-09-24 DIAGNOSIS — K219 Gastro-esophageal reflux disease without esophagitis: Secondary | ICD-10-CM | POA: Insufficient documentation

## 2014-09-24 MED ORDER — HYDROCHLOROTHIAZIDE 12.5 MG PO TABS: 12.5000 mg | ORAL_TABLET | Freq: Every day | ORAL | Status: DC

## 2014-09-24 MED ORDER — PANTOPRAZOLE SODIUM 40 MG PO TBEC: 40.0000 mg | DELAYED_RELEASE_TABLET | Freq: Every day | ORAL | Status: DC

## 2014-09-24 MED ORDER — SIMVASTATIN 10 MG PO TABS: 10.0000 mg | ORAL_TABLET | Freq: Every day | ORAL | Status: DC

## 2014-09-24 MED ORDER — ACETAMINOPHEN-CODEINE #3 300-30 MG PO TABS: 1.0000 | ORAL_TABLET | Freq: Two times a day (BID) | ORAL | Status: DC | PRN

## 2014-09-24 NOTE — Progress Notes (Signed)
Pt is here following up on his GERD, HTN and his hyperlipidemia. Pt states that he has chronic pain in his right shoulder. He said that he cant raise his arm. Pt states that his upper teeth are broken and he need to see a dentist. Pt has an interpreter.

## 2014-09-24 NOTE — Patient Instructions (Signed)
Bloating Bloating is the feeling of fullness in your belly. You may feel as though your pants are too tight. Often the cause of bloating is overeating, retaining fluids, or having gas in your bowel. It is also caused by swallowing air and eating foods that cause gas. Irritable bowel syndrome is one of the most common causes of bloating. Constipation is also a common cause. Sometimes more serious problems can cause bloating. SYMPTOMS  Usually there is a feeling of fullness, as though your abdomen is bulged out. There may be mild discomfort.  DIAGNOSIS  Usually no particular testing is necessary for most bloating. If the condition persists and seems to become worse, your caregiver may do additional testing.  TREATMENT   There is no direct treatment for bloating.  Do not put gas into the bowel. Avoid chewing gum and sucking on candy. These tend to make you swallow air. Swallowing air can also be a nervous habit. Try to avoid this.  Avoiding high residue diets will help. Eat foods with soluble fibers (examples include root vegetables, apples, or barley) and substitute dairy products with soy and rice products. This helps irritable bowel syndrome.  If constipation is the cause, then a high residue diet with more fiber will help.  Avoid carbonated beverages.  Over-the-counter preparations are available that help reduce gas. Your pharmacist can help you with this. SEEK MEDICAL CARE IF:   Bloating continues and seems to be getting worse.  You notice a weight gain.  You have a weight loss but the bloating is getting worse.  You have changes in your bowel habits or develop nausea or vomiting. SEEK IMMEDIATE MEDICAL CARE IF:   You develop shortness of breath or swelling in your legs.  You have an increase in abdominal pain or develop chest pain. Document Released: 04/20/2006 Document Revised: 09/12/2011 Document Reviewed: 06/08/2007 ExitCare Patient Information 2015 [employer]. This  information is not intended to replace advice given to you by your health care provider. Make sure you discuss any questions you have with your health care provider.  

## 2014-09-24 NOTE — Progress Notes (Signed)
   Subjective:    Patient ID: Brandon Clark, male    DOB: 12/06/1940, 74 y.o.   MRN: [id number]  Shoulder Pain  The pain is present in the right shoulder. This is a chronic problem. The current episode started more than 1 month ago. There has been no history of extremity trauma. The symptoms are aggravated by activity (LIFTING ARM ABOVE 90 DEGREE ANGLE). He has tried oral narcotics (has tried lidoderm patch, nitro transdermal patch, and tramadol ) for the symptoms. The treatment provided no relief. Family history does not include rheumatoid arthritis. rotator inpingement      Review of Systems  Gastrointestinal: Positive for nausea and abdominal distention. Negative for vomiting, constipation and blood in stool.  Musculoskeletal: Positive for arthralgias.  All other systems reviewed and are negative.      Objective:   Physical Exam  Cardiovascular: Normal rate, regular rhythm and normal heart sounds.   Pulmonary/Chest: Effort normal and breath sounds normal.  Abdominal: Soft. Bowel sounds are normal. He exhibits distension. There is no tenderness.      Assessment & Plan:  Brandon Clark was seen today for follow-up.  Diagnoses and all orders for this visit:  Essential hypertension Orders: -     hydrochlorothiazide (HYDRODIURIL) 12.5 MG tablet; Take 1 tablet (12.5 mg total) by mouth daily. Patient blood pressure is stable and may continue on current medication.  Education on diet, exercise, and modifiable risk factors discussed. Will obtain appropriate labs as needed. Will follow up in 3-6 months.   HLD (hyperlipidemia) Orders: -     simvastatin (ZOCOR) 10 MG tablet; Take 1 tablet (10 mg total) by mouth at bedtime. Education provided on proper lifestyle changes in order to lower cholesterol. Patient advised to maintain healthy weight and to keep total fat intake at 25-35% of total calories and carbohydrates 50-60% of total daily calories. Explained how high cholesterol places  patient at risk for heart disease. Patient placed on appropriate medication and repeat labs in 6 months   Gastroesophageal reflux disease without esophagitis Orders: -     pantoprazole (PROTONIX) 40 MG tablet; Take 1 tablet (40 mg total) by mouth daily. Stressed to patient several times the need to avoid spicy and heavily seasoned foods to prevent abdominal distension and acid reflux. Patient reports that the foods he eat is part of his culture.  Right shoulder pain Orders: -     acetaminophen-codeine (TYLENOL #3) 300-30 MG per tablet; Take 1 tablet by mouth 2 (two) times daily as needed for moderate pain. -     Ambulatory referral to Orthopedic Surgery  Due to language barrier, an interpreter was present during the history-taking and subsequent discussion (and for part of the physical exam) with this patient.  Return in about 6 months (around 03/27/2015) for Hypertension.  Holland CommonsKECK, VALERIE, NP 10/02/2014 11:28 PM

## 2014-10-02 ENCOUNTER — Encounter: Payer: Self-pay | Admitting: Internal Medicine

## 2014-10-10 ENCOUNTER — Other Ambulatory Visit: Payer: Self-pay | Admitting: Family Medicine

## 2014-10-10 ENCOUNTER — Ambulatory Visit
Admission: RE | Admit: 2014-10-10 | Discharge: 2014-10-10 | Disposition: A | Discharge status: Home / Self Care | Payer: No Typology Code available for payment source | Source: Ambulatory Visit | Attending: Family Medicine | Admitting: Family Medicine

## 2014-10-10 ENCOUNTER — Ambulatory Visit (INDEPENDENT_AMBULATORY_CARE_PROVIDER_SITE_OTHER): Payer: Self-pay | Admitting: Family Medicine

## 2014-10-10 ENCOUNTER — Encounter: Payer: Self-pay | Admitting: Family Medicine

## 2014-10-10 VITALS — BP 141/102 | HR 79 | Ht 72.0 in | Wt 240.0 lb

## 2014-10-10 DIAGNOSIS — M25512 Pain in left shoulder: Secondary | ICD-10-CM

## 2014-10-10 DIAGNOSIS — M25562 Pain in left knee: Secondary | ICD-10-CM

## 2014-10-10 DIAGNOSIS — M25561 Pain in right knee: Secondary | ICD-10-CM

## 2014-10-10 DIAGNOSIS — M2550 Pain in unspecified joint: Secondary | ICD-10-CM

## 2014-10-10 DIAGNOSIS — M25511 Pain in right shoulder: Secondary | ICD-10-CM

## 2014-10-10 MED ORDER — METHYLPREDNISOLONE ACETATE 40 MG/ML IJ SUSP
40.0000 mg | Freq: Once | INTRAMUSCULAR | Status: AC
  Administered 2014-10-10: 40 mg via INTRA_ARTICULAR

## 2014-10-10 NOTE — Assessment & Plan Note (Signed)
We did not have time to do an ultrasound today because use of interpreter and the language barrier significantly prolonged our visit. I do think he has some subacromial bursitis so I gave him an injection today. We'll give him a home exercise program and he'll come back in 2-3 weeks. At that time hopefully we can do an ultrasound. I doubt that he has any major rotator cuff tears as he has pretty intact strength.

## 2014-10-10 NOTE — Progress Notes (Signed)
Patient ID: Brandon Clark, male   DOB: 03-04-1941, 74 y.o.   MRN: [id number]   Interpreter for visit is Virgel BouquetJameel Ali

## 2014-10-10 NOTE — Progress Notes (Signed)
Patient ID: Brandon Clark, male   DOB: [birth year], 74 y.o.   MRN: [id number]  Brandon Clark - 74 y.o. male MRN [id number]  Date of birth: [birth year]    SUBJECTIVE:     Visit conducted with the assistance of the interpreter. Complaint of 3 years or more of right shoulder pain that is gradually worsening. Right-hand dominant. Pain with sleeping on that shoulder, pain with overhead activity. Also has difficulty putting his shirt on and taking it off. Pain with combing his hair. No history of shoulder injury or shoulder surgery. 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 Complaint of bilateral knee stiffness in the morning. He first gets up they're very stiff and painful. As the day goes on they get much better. He also has a lot of pain if he tries to bend down or squat and is essentially unable to do this. This is been a gradual worsening over years. He has never had any specific injury to his knees. ROS:     He's had some mild weight gain over the last 5 years but nothing in the last 6-12 months that is unusual. He's not had any other unusual arthralgias except what is listed in the history of present illness. He denies any recent fever, sweats, chills. He's not noted any unusual bruising. He does have some dizziness which he went to urgent care for a nature him he had vertigo. It is intermittent.  PERTINENT  PMH / PSH FH / / SH:  Past Medical, Surgical, Social, and Family History Reviewed & Updated in the EMR.  Pertinent findings include:  Hypertension Overweight  OBJECTIVE: BP 141/102 mmHg  Pulse 79  Ht 6' (1.829 m)  Wt 240 lb (108.863 kg)  BMI 32.54 kg/m2  Physical Exam:  Vital signs are reviewed. GEN.: Well-developed slightly overweight male no acute distress SHOULDER: Right. He lacks full overhead extension by 5 secondary to severe pain. He has pain with supraspinatus testing. External rotation is normal. Pain  with internal rotation significant enough that he will not place his hand into his hip pocket. Mild tenderness over the bicipital tendon. EXTREMITY: Bilaterally he has normal muscle bulk and tone in the upper extremity specifically biceps and triceps normal. Intact and normal symmetrical grip strength. Wrists bilaterally have normal extension flexion is painless. KNEES: He can flex and extend from a seated position but cannot squat beyond about 45 secondary to pain. He has some very mild crepitus on the right. There is no effusion, no warmth or erythema noted on either knee. Popliteal space is soft. Ligamentously intact to varus and valgus stress. Normal Lockman. Calves bilaterally are soft.   INJECTION: Patient was given informed consent, signed copy in the chart. Appropriate time out was taken. Area prepped and draped in usual sterile fashion. 1 cc of methylprednisolone 40 mg/ml plus  4 cc of 1% lidocaine without epinephrine was injected into the right subacromial bursa using a(n) posterior approach. The patient tolerated the procedure well. There were no complications. Post procedure instructions were given.  ASSESSMENT & PLAN:  See problem based charting & AVS for pt instructions.

## 2014-10-10 NOTE — Assessment & Plan Note (Signed)
He had some knee films from 2014 which showed some mild to moderate arthritis. His symptoms are consistent with meniscal issues in that he has difficulty squatting all the way but the stiffness seems very much associated with arthritis kind of pain. I think we need to get some new imaging and follow this up when I see him back.

## 2014-10-24 ENCOUNTER — Ambulatory Visit (INDEPENDENT_AMBULATORY_CARE_PROVIDER_SITE_OTHER): Payer: No Typology Code available for payment source | Admitting: Family Medicine

## 2014-10-24 ENCOUNTER — Encounter: Payer: Self-pay | Admitting: Family Medicine

## 2014-10-24 VITALS — BP 136/102 | Ht 72.0 in | Wt 240.0 lb

## 2014-10-24 DIAGNOSIS — M25562 Pain in left knee: Secondary | ICD-10-CM

## 2014-10-24 DIAGNOSIS — M25561 Pain in right knee: Secondary | ICD-10-CM

## 2014-10-24 DIAGNOSIS — M25512 Pain in left shoulder: Secondary | ICD-10-CM

## 2014-10-24 DIAGNOSIS — M25511 Pain in right shoulder: Secondary | ICD-10-CM

## 2014-10-24 MED ORDER — METHYLPREDNISOLONE ACETATE 40 MG/ML IJ SUSP
40.0000 mg | Freq: Once | INTRAMUSCULAR | Status: AC
  Administered 2014-10-24: 40 mg via INTRA_ARTICULAR

## 2014-10-26 ENCOUNTER — Encounter: Payer: Self-pay | Admitting: Family Medicine

## 2014-10-26 NOTE — Progress Notes (Signed)
Newton Frutiger - 74 y.o. male MRN [id number]  Date of birth: 07-19-1940  SUBJECTIVE:  Including CC & ROS.  Patient's visit was conducted with an interpreter.   Patient is a 74 yo male present today for f/u of bilateral shoulder pain right > left shoulder. Also bilateral knee pain left > right.   Shoulder pain: right shoulder has been causing a dull aching pain for many years. Worse when sleeping and with overhead activities, no history of injury and right hand dominate. At his last visit he was suspect to have rotator cuff tendonitis and was treated with a subacromial injection. Patient reports initial response with pain control for about three days then it stopped helping. He continues to use OTC NSAIDS as needed.   Knee pain: he continues to have bilateral knee pain which describes as constant ache. 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 Pain is worse with sitting for long periods of time. NSAIDS are not helpful. But he denies any locking, catching or giving way.    ROS: Review of systems otherwise negative except for information present in HPI  HISTORY: Past Medical, Surgical, Social, and Family History Reviewed & Updated per EMR. Pertinent Historical Findings include: Hypertension Overweight  DATA REVIEWED: Reviewed patient's xray results   PHYSICAL EXAM:  VS: BP:(!) 136/102 mmHg  HR: bpm  TEMP: ( )  RESP:   HT:6' (182.9 cm)   WT:240 lb (108.863 kg)  BMI:32.6 RIGHT SHOULDER EXAM:  General: well nourished Skin of UE: warm; dry, no rashes, lesions, ecchymosis or erythema. Vascular: radial pulses 2+ bilaterally Neurologically: Normal sensation with no sensory or motor defects in C4-C8, bilateral Palpation: TTP over biceps groove ROM active/passive: limited flex/ abd on right. Limited ER.  Strength testing: 4+/5 symmetric strength in internal and external rotation, forward flexion,  adduction and abduction     Special Test: positive Neer's, positive Hawkins, positive empty can, positive O'Brien LEFT KNEE EXAM:  General: well nourished, no acute distress Skin of LE: warm; dry, no rashes, lesions, ecchymosis or erythema. Vascular: Dorsal pedal pulses 2+ bilaterally Neurologically: Sensation to light touch lower extremities equal and intact  Normal to inspection with no erythema or effusion or obvious bony abnormalities. Palpation: no warmth, medial and lateral joint line tenderness  Range of motion: ROM normal in flexion and extension and lower leg rotation. Ligaments with solid consistent endpoints including ACL, PCL, LCL, MCL. Negative patella apprehension and normal tracking Meniscal evaluation: positive McMurray's test, Normal gait. Hamstring and quadriceps strength is normal.  MSK [place]: Right shoulder had several septate pockets of hypoechoic fluids around the bicepital groove. Irregularity of the humeral head, infraspinatus, subscapularis and supraspinatus had calficic changes but no distinct partial or complete tears.   ASSESSMENT & PLAN: See problem based charting & AVS for pt instructions. 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 Will f/u in office to discuss results.  - For the left knee suspect most of his pain is from OA due to the lack of any mechanical symptoms. Will treat with intra-articular injection to inflammation and pain control. When patient f/u for shoulder MRI is discuss response to  injection therapy. Recommend continue to stay active and use NSAIDS PRN.   The patient's clinical condition is marked by  substantial pain and/or significant functional disability. Other conservative therapy has not provided relief, is contraindicated, or not appropriate. There is a reasonable likelihood that cortisone injection will signicantly improve the patient's pain and/or functional disability.  Injection procedure: Consent obtained and verified. Sterile betadine prep. Furthur cleansed with alcohol and betadine. Topical analgesic spray: Ethyl chloride. Injection Indication: DJD of the left knee Approached in typical fashion with: anteriolateral approach Meds: 40mg  in 1cc depomedrol and 4cc lidocaine Needle: 25 g 1.5 in needle Completed without difficulty Aftercare instructions and Red flags advised. Advised to call if fevers/chills, erythema, induration, drainage, or persistent bleeding.

## 2014-10-27 ENCOUNTER — Ambulatory Visit
Admission: RE | Admit: 2014-10-27 | Discharge: 2014-10-27 | Disposition: A | Discharge status: Home / Self Care | Payer: No Typology Code available for payment source | Source: Ambulatory Visit | Attending: Family Medicine | Admitting: Family Medicine

## 2014-10-27 DIAGNOSIS — M25511 Pain in right shoulder: Secondary | ICD-10-CM

## 2014-10-27 DIAGNOSIS — M25512 Pain in left shoulder: Principal | ICD-10-CM

## 2014-10-29 ENCOUNTER — Encounter: Payer: Self-pay | Admitting: Family Medicine

## 2014-10-29 ENCOUNTER — Telehealth: Payer: Self-pay | Admitting: Family Medicine

## 2014-10-29 NOTE — Telephone Encounter (Signed)
PERFECT! Brandon LevySara Khayri Kargbo

## 2014-10-29 NOTE — Telephone Encounter (Signed)
Brandon Clark or [employer] How do we give him his test results? We usually use an interpretor with him. Do you know? I need to tell him he has really bad shoulder arthriits---unclear if surgery would benefit him--also he has no insurance. He needs an appt w me innear future so we can discuss options. Can u see if you can get him appt--or see if he already has one? THANKS! Denny LevySara Caryle Helgeson

## 2014-10-29 NOTE — Telephone Encounter (Signed)
He actually has an appt to see you this Fri at 11:45

## 2014-10-31 ENCOUNTER — Encounter: Payer: Self-pay | Admitting: Family Medicine

## 2014-10-31 ENCOUNTER — Ambulatory Visit (INDEPENDENT_AMBULATORY_CARE_PROVIDER_SITE_OTHER): Payer: Self-pay | Admitting: Family Medicine

## 2014-10-31 VITALS — BP 142/85 | Ht 72.0 in | Wt 240.0 lb

## 2014-10-31 DIAGNOSIS — M25512 Pain in left shoulder: Secondary | ICD-10-CM

## 2014-10-31 DIAGNOSIS — M25511 Pain in right shoulder: Secondary | ICD-10-CM

## 2014-10-31 NOTE — Assessment & Plan Note (Signed)
Reviewed MRI findings of his right shoulder. Certainly explains the ultrasound findings of a large fluid filled pocket on the anterior portion of his shoulder. Overall the MRI shows quite a bit of degenerative change. Think this correlates well with his symptoms. I think he would benefit from evaluation for possible surgical intervention we have discussed this at length. I will set him up for orthopedic evaluation. He has some questions about his left shoulder by think we need to deal with the right shoulder first. We'll be happy see him back when necessary.

## 2014-10-31 NOTE — Progress Notes (Signed)
   Subjective:    Patient ID: Brandon Clark, male    DOB: 1940/12/23, 74 y.o.   MRN: [id number]  HPI Follow-up MRI of right shoulder. Continues to have the same pain. Visit today conducted with use of interpreter.   Review of Systems No new symptoms specifically no new numbness or weakness in the right upper extremity. He continues to have right shoulder pain.    Objective:   Physical Exam  Vital signs are reviewed GEN.: Well-developed male no acute distress IMAGING:Extensive rim rent tear of the distal supraspinatus and infraspinatus tendons with small full-thickness component. 2. Synovitis of the glenohumeral joint with a prominent joint effusion. Prominent fluid throughout the subacromial/subdeltoid bursae with fluid extending into the widened acromioclavicular joint. 3. Severe tendinosis of the subscapularis tendon with severe degeneration of the proximal long head of the biceps tendon.      Assessment & Plan:

## 2014-11-12 ENCOUNTER — Ambulatory Visit: Payer: Self-pay | Admitting: Internal Medicine

## 2014-12-04 ENCOUNTER — Ambulatory Visit: Payer: Self-pay | Admitting: Internal Medicine

## 2014-12-08 ENCOUNTER — Ambulatory Visit: Payer: Self-pay

## 2014-12-15 ENCOUNTER — Encounter: Payer: Self-pay | Admitting: Internal Medicine

## 2014-12-15 ENCOUNTER — Ambulatory Visit: Payer: Self-pay | Attending: Internal Medicine | Admitting: Internal Medicine

## 2014-12-15 VITALS — BP 125/86 | HR 70 | Temp 98.6°F | Resp 16 | Ht 72.0 in | Wt 247.0 lb

## 2014-12-15 DIAGNOSIS — R0683 Snoring: Secondary | ICD-10-CM

## 2014-12-15 DIAGNOSIS — R3912 Poor urinary stream: Secondary | ICD-10-CM

## 2014-12-15 DIAGNOSIS — N3941 Urge incontinence: Secondary | ICD-10-CM

## 2014-12-15 DIAGNOSIS — R319 Hematuria, unspecified: Secondary | ICD-10-CM

## 2014-12-15 DIAGNOSIS — R1033 Periumbilical pain: Secondary | ICD-10-CM

## 2014-12-15 DIAGNOSIS — K429 Umbilical hernia without obstruction or gangrene: Secondary | ICD-10-CM

## 2014-12-15 LAB — POCT URINALYSIS DIPSTICK
Bilirubin, UA: NEGATIVE
Glucose, UA: NEGATIVE
Ketones, UA: NEGATIVE
Leukocytes, UA: NEGATIVE
NITRITE UA: NEGATIVE
PH UA: 5
Protein, UA: NEGATIVE
Spec Grav, UA: 1.015
Urobilinogen, UA: 0.2

## 2014-12-15 NOTE — Patient Instructions (Signed)

## 2014-12-15 NOTE — Progress Notes (Signed)
Patient ID: Brandon Clark, male   DOB: August 05, 1940, 74 y.o.   MRN: [id number]  CC: abdominal pain, sleep study  HPI: Brandon Clark is a 74 y.o. male here today for a follow up visit.  Patient has past medical history of GERD, HTN, HLD. He continues to complain about abdominal pain. He reports that the pain is around his umbilicus. He has been told by GI that he has a hernia but since that time it has become more painful with certain movements.  He c/o that he sleeps often during the day and is often very tired upon awakening. His family tells him that he snores very loud and he has periods of apnea which makes him wake up quickly. His friend states that he will snore after less than 5 minutes of sleeping.  His next concern is occasional incontinence. He reports that he has urgency to go which often causes him to loss control of his bladder. He denies dysuria, flank pain, fever, chills. He gets up 3-4 times per night to void and he has noticed his urine stream is a lot weaker than before. He also notes that he does not feel like he is completely emptying his bladder after voiding.   No Known Allergies Past Medical History  Diagnosis Date  . Acid reflux   . Ulcer   . Sleep apnea   . Hypertension   . Hyperlipidemia    Current Outpatient Prescriptions on File Prior to Visit  Medication Sig Dispense Refill  . acetaminophen-codeine (TYLENOL #3) 300-30 MG per tablet Take 1 tablet by mouth 2 (two) times daily as needed for moderate pain. 30 tablet 0  . aspirin EC 81 MG EC tablet Take 1 tablet (81 mg total) by mouth daily. 30 tablet 0  . hydrochlorothiazide (HYDRODIURIL) 12.5 MG tablet Take 1 tablet (12.5 mg total) by mouth daily. 90 tablet 4  . Hyoscyamine Sulfate 0.375 MG TBCR One tab twice a day  as needed, abdominal pain. 60 tablet 1  . meclizine (ANTIVERT) 50 MG tablet Take 0.5 tablets (25 mg total) by mouth 3 (three) times daily as needed for dizziness or nausea. 30 tablet 1  .  nitroGLYCERIN (NITRODUR - DOSED IN MG/24 HR) 0.2 mg/hr patch 1/4th patch over affected shoulder, change daily 30 patch 1  . pantoprazole (PROTONIX) 40 MG tablet Take 1 tablet (40 mg total) by mouth daily. 90 tablet 7  . simvastatin (ZOCOR) 10 MG tablet Take 1 tablet (10 mg total) by mouth at bedtime. 90 tablet 3   No current facility-administered medications on file prior to visit.   Family History  Problem Relation Age of Onset  . Diabetes Mother   . Diabetes Brother   . Heart attack Neg Hx   . Hyperlipidemia Neg Hx   . Hypertension Neg Hx   . Sudden death Neg Hx    History   Social History  . Marital Status: Married    Spouse Name: N/A  . Number of Children: 7  . Years of Education: N/A   Occupational History  . Engineer    Social History Main Topics  . Smoking status: Never Smoker   . Smokeless tobacco: Never Used  . Alcohol Use: No  . Drug Use: No  . Sexual Activity: No   Other Topics Concern  . Not on file   Social History Narrative    Review of Systems: See HPI   Objective:   Filed Vitals:   12/15/14 0930  BP: 159/89  Pulse:  77  Temp: 98.6 F (37 C)  Resp: 16    Physical Exam  Cardiovascular: Normal rate, regular rhythm and normal heart sounds.   Pulmonary/Chest: Effort normal and breath sounds normal.  Abdominal: Soft. Bowel sounds are normal. There is tenderness. A hernia (umbilical) is present.  Genitourinary: Prostate normal.  Skin: Skin is warm and dry.     Lab Results  Component Value Date   WBC 4.0 06/30/2014   HGB 18.0* 06/30/2014   HCT 53.0* 06/30/2014   MCV 86.0 06/30/2014   PLT 204 06/30/2014   Lab Results  Component Value Date   CREATININE 1.10 06/30/2014   BUN 16 06/30/2014   NA 139 06/30/2014   K 3.8 06/30/2014   CL 101 06/30/2014   CO2 26 06/30/2014    Lab Results  Component Value Date   HGBA1C 6.3* 06/30/2014   Lipid Panel     Component Value Date/Time   CHOL 159 07/01/2014 0638   TRIG 133 07/01/2014 0638    HDL 38* 07/01/2014 0638   CHOLHDL 4.2 07/01/2014 0638   VLDL 27 07/01/2014 0638   LDLCALC 94 07/01/2014 0638       Assessment and plan:   Brandon Clark was seen today for fatigue and abdominal pain.  Diagnoses and all orders for this visit:  Umbilical hernia without obstruction and without gangrene Orders: -     Ambulatory referral to General Surgery I will refer patient to [facility] No heavy lifting or strenuous activity    Periumbilical abdominal pain Likely due to increasing size of hernia   Urge incontinence of urine Orders: -     POCT urinalysis dipstick -     Basic Metabolic Panel  Weak urine stream Orders: -     PSA Prostate does not feel enlarged or tender  Hematuria Orders: -     Ambulatory referral to Urology---needs further evaluation to r/o cancerous process. Especially with current symptoms related to prostate  Snoring Orders: -     Ambulatory referral to Sleep Studies Weight loss discussed   Due to language barrier, an interpreter was present during the history-taking and subsequent discussion (and for part of the physical exam) with this patient.  May follow up as needed.    Holland Commons, NP-C [facility] [phone] 12/15/2014, 9:45 AM

## 2014-12-15 NOTE — Addendum Note (Signed)
Addended by: Holland Commons A on: 12/15/2014 06:21 PM   Modules accepted: Orders

## 2014-12-15 NOTE — Progress Notes (Signed)
Complaining sleeping to much Requesting sleep study Abdominal pain x 2 month

## 2014-12-16 LAB — PSA: PSA: 1.4 ng/mL (ref <=4.00)

## 2014-12-16 LAB — BASIC METABOLIC PANEL
BUN: 16 mg/dL (ref 6–23)
CO2: 27 mEq/L (ref 19–32)
Calcium: 9.7 mg/dL (ref 8.4–10.5)
Chloride: 102 mEq/L (ref 96–112)
Creat: 1.21 mg/dL (ref 0.50–1.35)
Glucose, Bld: 118 mg/dL — ABNORMAL HIGH (ref 70–99)
Potassium: 4.6 mEq/L (ref 3.5–5.3)
Sodium: 139 mEq/L (ref 135–145)

## 2014-12-24 ENCOUNTER — Ambulatory Visit: Payer: Self-pay

## 2014-12-29 ENCOUNTER — Telehealth: Payer: Self-pay

## 2014-12-29 NOTE — Telephone Encounter (Signed)
Nurse called patient, via [employer], Interpreter [phone]. Reached voicemail, left message for patient to return call to [facility], [phone]. Interpreter attempted to call second number at [phone], invalid number.

## 2014-12-29 NOTE — Telephone Encounter (Signed)
-----   Message from Brandon FinlandValerie A Keck, NP sent at 12/16/2014  7:18 PM EDT ----- Prostate cancer screening was normal. But he still needs to go to urology about urinary symptoms and blood in his urine

## 2015-01-06 NOTE — Telephone Encounter (Signed)
Nurse called patient, [employer], Interpreter [phone]. Reached voicemail, left message for patient to return call to [facility] with [facility], [phone]. Message left on home voicemail. Mobile voicemail is not accepting incoming calls.

## 2015-01-19 ENCOUNTER — Ambulatory Visit: Payer: Self-pay | Admitting: Family Medicine

## 2015-01-21 ENCOUNTER — Other Ambulatory Visit: Payer: Self-pay | Admitting: Internal Medicine

## 2015-01-21 DIAGNOSIS — R319 Hematuria, unspecified: Secondary | ICD-10-CM

## 2015-01-26 ENCOUNTER — Ambulatory Visit (INDEPENDENT_AMBULATORY_CARE_PROVIDER_SITE_OTHER): Payer: Self-pay | Admitting: Family Medicine

## 2015-01-26 ENCOUNTER — Encounter: Payer: Self-pay | Admitting: Family Medicine

## 2015-01-26 VITALS — BP 119/90 | HR 103 | Ht 72.0 in | Wt 247.0 lb

## 2015-01-26 DIAGNOSIS — M25562 Pain in left knee: Secondary | ICD-10-CM

## 2015-01-26 DIAGNOSIS — M25512 Pain in left shoulder: Secondary | ICD-10-CM

## 2015-01-26 DIAGNOSIS — M25511 Pain in right shoulder: Secondary | ICD-10-CM

## 2015-01-26 DIAGNOSIS — M25561 Pain in right knee: Secondary | ICD-10-CM

## 2015-01-26 MED ORDER — METHYLPREDNISOLONE ACETATE 40 MG/ML IJ SUSP
40.0000 mg | Freq: Once | INTRAMUSCULAR | Status: AC
  Administered 2015-01-26: 40 mg via INTRA_ARTICULAR

## 2015-01-26 NOTE — Progress Notes (Signed)
Patient ID: Brandon Clark, male   DOB: 02-Nov-1940, 74 y.o.   MRN: [id number]   Interpreter for visit is Virgel Bouquet

## 2015-01-27 NOTE — Assessment & Plan Note (Addendum)
On standing x-rays April 2016, mild to moderate DJD. Medial compartment. Also looks like patellofemoral compartment is involved. We had given him injection in the left knee at last office visit so today we injected the right knee. We discussed frequency of injection therapy and medication weight as long as he could before repeat injection.

## 2015-01-27 NOTE — Progress Notes (Signed)
Subjective:    Patient ID: Brandon Clark, male    DOB: 1941/03/26, 74 y.o.   MRN: [id number]  HPI Visit conducted with the use of interpreter listed above. Patient is here for follow-up of bilateral shoulder and bilateral knee pain. At last office visit we had discussed his MRI of his right shoulder and referred him to the orthopedic surgeon he saw last month. They discussed possible surgery but she does not want to pursue right now. The orthopedist gave him an additional injection which seemed to help his pain quite a bit. #2. Regarding his left knee which I injected last time, he has significant as in 90% of his pain relief with the injection. He has no problems with any type of side effects. He would like to consider an injection in the right knee today. #3. He also wants to consider an injection in his left shoulder as it has been problematic with similar but less severe symptoms than his right shoulder. We have briefly previously looked at this.   Review of Systems No unusual weight change, fever, sweats, chills. He's had no swelling or erythema of right knee or left shoulder. He's had no numbness in his upper extremity. Denies any calf pain. Has not had any unusual bruising.    Objective:   Physical Exam  Vital signs are reviewed GEN.: Well-developed male no acute distress Shoulder: Left. Full range of motion although he has pain with abduction and forward flexion above 100. Internal rotation is limited but painless. He can only get his hand to the area of the hip pocket. External rotation is full and painless. He has intact 5 out of 5 strength in all planes the rotator cuff. The bicep tendon is nontender to palpation. Shoulder: Right. Painless forward flexion to 110 then full range of motion with pain beyond that. Abduction is painless to 110 and then some pain but range of motion. Internal rotation is limited in that he can place his hand to the area of his hip pocket.  External rotation is painless and normal in range. Muscular strength is 5 out of 5 in all planes the rotator cuff. KNEES: Bilaterally there is no sign of effusion, there is no warmth or erythema. The popliteal spaces soft. The calf is soft bilaterally. Intact sensation to soft touch bilateral lower extremity. Right knee has full range of motion extension and flexion. Ligamentously intact to varus and valgus stress. He has mild tenderness to palpation medial greater than the lateral with bilateral joint lines. He has mild crepitus on extension. Positive patellar grind test.  INJECTION: Patient was given informed consent, signed copy in the chart. Appropriate time out was taken. Area prepped and draped in usual sterile fashion. 1 cc of methylprednisolone 40 mg/ml plus  4 cc of 1% lidocaine without epinephrine was injected into the left subacromial bursa using a(n) posterior approach. The patient tolerated the procedure well. There were no complications. Post procedure instructions were given.  INJECTION: Patient was given informed consent, signed copy in the chart. Appropriate time out was taken. Area prepped and draped in usual sterile fashion. 1 cc of methylprednisolone 40 mg/ml plus  4 cc of 1% lidocaine without epinephrine was injected into the right knee joint using a(n) anterior medial approach. The patient tolerated the procedure well. There were no complications. Post procedure instructions were given.  NEURO: Intact sensation to soft touch bilaterally in the hands.  VASCULAR: Dorsalis pedis pulses and radial pulses are 2+ bilaterally equal.  Assessment & Plan:

## 2015-01-27 NOTE — Assessment & Plan Note (Signed)
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 He is now had 2 injections in that shoulder, most recent by Dr. August Saucer. Dr. August Saucer recommended surgery and we discussed that today. He would like to avoid. I saw him we could not do any more than 2 injections in his shoulders yearly as in each shoulder every 6 months. He would rather see how this does then do surgery because he says he is "an old man" at 74 years of age and does not want to have any major surgery.

## 2015-02-04 ENCOUNTER — Encounter (HOSPITAL_BASED_OUTPATIENT_CLINIC_OR_DEPARTMENT_OTHER): Payer: Self-pay | Admitting: Clinical

## 2015-02-04 ENCOUNTER — Other Ambulatory Visit: Payer: Self-pay | Admitting: Internal Medicine

## 2015-02-04 DIAGNOSIS — Z789 Other specified health status: Secondary | ICD-10-CM

## 2015-02-04 DIAGNOSIS — Z758 Other problems related to medical facilities and other health care: Secondary | ICD-10-CM

## 2015-02-04 NOTE — Progress Notes (Signed)
ASSESSMENT: Pt currently experiencing mild concern over language barrier; pt needs to go to upcoming surgery knowing that Arabic interpreter will now be provided for him.  Stage of Change: action  PLAN: 1. F/U with behavioral health consultant in as needed 2. Psychiatric Medications: n/a. 3. Behavioral recommendation(s):     SUBJECTIVE: Pt. referred by self for language barrier affecting healthcare:  Pt. reports the following symptoms/concerns: Pt concerned that he will not have interpreter available when he goes into surgery on 02-09-15 at [facility] Duration of problem: about a week Severity: mild  OBJECTIVE: Orientation & Cognition: Oriented x3. Thought processes normal and appropriate to situation. Mood: appropriate. Affect: appropriate Appearance: appropriate Risk of harm to self or others: no risk of harm to self or others Substance use: none Assessments administered: n/a  Diagnosis: Language barrier affecting health care CPT Code: Z78.9 -------------------------------------------- Other(s) present in the room: Arabic interpreter on interpreter line [phone]  Time spent with patient in exam room: 10 minutes

## 2015-02-18 ENCOUNTER — Telehealth: Payer: Self-pay

## 2015-02-18 NOTE — Telephone Encounter (Signed)
Nurse called patient via [employer], Interpreter [phone]. Interpreter left message for patient to return call to [facility] with [facility] at [phone].

## 2015-02-18 NOTE — Telephone Encounter (Signed)
-----   Message from Ambrose Finland, NP sent at 01/21/2015  1:16 PM EDT ----- Please call this patient and have him to come in for a urine. Order is already placed. He needs this before he can go to Insurance underwriter. Thanks

## 2015-02-24 ENCOUNTER — Ambulatory Visit: Payer: Self-pay | Attending: Internal Medicine

## 2015-02-24 DIAGNOSIS — R319 Hematuria, unspecified: Secondary | ICD-10-CM

## 2015-02-25 LAB — URINALYSIS, ROUTINE W REFLEX MICROSCOPIC
Bilirubin Urine: NEGATIVE
GLUCOSE, UA: NEGATIVE
HGB URINE DIPSTICK: NEGATIVE
KETONES UR: NEGATIVE
LEUKOCYTES UA: NEGATIVE
NITRITE: NEGATIVE
PH: 5.5 (ref 5.0–8.0)
Protein, ur: NEGATIVE
Specific Gravity, Urine: 1.025 (ref 1.001–1.035)

## 2015-03-02 DIAGNOSIS — M6208 Separation of muscle (nontraumatic), other site: Secondary | ICD-10-CM | POA: Insufficient documentation

## 2015-03-11 ENCOUNTER — Ambulatory Visit (HOSPITAL_BASED_OUTPATIENT_CLINIC_OR_DEPARTMENT_OTHER): Payer: Self-pay | Attending: Internal Medicine | Admitting: Radiology

## 2015-03-11 VITALS — Ht 72.0 in | Wt 240.0 lb

## 2015-03-11 DIAGNOSIS — G4736 Sleep related hypoventilation in conditions classified elsewhere: Secondary | ICD-10-CM | POA: Insufficient documentation

## 2015-03-11 DIAGNOSIS — R0683 Snoring: Secondary | ICD-10-CM | POA: Insufficient documentation

## 2015-03-11 DIAGNOSIS — G4733 Obstructive sleep apnea (adult) (pediatric): Secondary | ICD-10-CM | POA: Insufficient documentation

## 2015-03-14 DIAGNOSIS — R0683 Snoring: Secondary | ICD-10-CM

## 2015-03-14 DIAGNOSIS — G4733 Obstructive sleep apnea (adult) (pediatric): Secondary | ICD-10-CM

## 2015-03-14 NOTE — Progress Notes (Signed)
  Patient Name: Brandon Clark, Brandon Clark Date: 03/11/2015 Gender: Male D.O.B: 1940/10/19 Age (years): 74 Referring Provider: Ambrose Finland Height (inches): 72 Interpreting Physician: Jetty Duhamel MD, ABSM Weight (lbs): 240 RPSGT: Shelah Lewandowsky BMI: 33 MRN: [id number] Neck Size: 17.00 CLINICAL INFORMATION The patient is referred for a split night CPAP titration     Date of  diagnostic NPSG, Split Night or HST: NPSG 03/02/14  AHI 68.7/ hr,  weight 240 lbs  SLEEP STUDY TECHNIQUE As per the AASM Manual for the Scoring of Sleep and Associated Events v2.3 (April 2016) with a hypopnea requiring 4% desaturations.  The channels recorded and monitored were frontal, central and occipital EEG, electrooculogram (EOG), submentalis EMG (chin), nasal and oral airflow, thoracic and abdominal wall motion, anterior tibialis EMG, snore microphone, electrocardiogram, and pulse oximetry. Bilevel positive airway pressure (BPAP) was initiated and titrated to treat sleep-disordered breathing.  MEDICATIONS Medications administered by patient during sleep study : No sleep medicine administered.  RESPIRATORY PARAMETERS Optimal IPAP Pressure (cm): 18 AHI at Optimal Pressure (/hr) 4.6 Optimal EPAP Pressure (cm):14     Overall Minimal O2 (%): 76.00 Minimal O2 at Optimal Pressure (%): 76.00  SLEEP ARCHITECTURE Start Time: 10:58:14 PM Stop Time: 5:00:08 AM Total Time (min): 361.9 Total Sleep Time (min): 252.0 Sleep Latency (min): 26.4 Sleep Efficiency (%): 69.6 REM Latency (min): 71.5 WASO (min): 83.5 Stage N1 (%): 36.11 Stage N2 (%): 21.23 Stage N3 (%): 0.00 Stage R (%): 42.66 Supine (%): 58.53 Arousal Index (/hr): 50.7      CARDIAC DATA The 2 lead EKG demonstrated sinus rhythm. The mean heart rate was 59.35 beats per minute. Other EKG findings include: None.  LEG MOVEMENT DATA The total Periodic Limb Movements of Sleep (PLMS) were 0. The PLMS index was 0.00. A PLMS index of <15 is considered  normal in adults.  IMPRESSIONS Baseline  AHI for this study was 41.1/ hr, confirming diagnosis of severe obstructive sleep apnea CPAP was titrated to 15 cwp with residual events and AHI 40.4/ hr and poor patient tolerance of CPAP. BiPAP provided successful control at Inspiratory 18 cwp and Expiratory 14 cwp, AHI 3.7/ hr. He wore a large [employer] Simplus full face mask with Easy-Breathe and heated humidity. Central sleep apnea was not noted during this titration (CAI = 1.2/h). Severe oxygen desaturations were observed during this titration (min O2 = 76.00%). The patient snored with Moderate snoring volume. No cardiac abnormalities were observed during this study. Clinically significant periodic limb movements were not noted during this study. Arousals associated with PLMs were rare.  DIAGNOSIS Obstructive Sleep Apnea (327.23 [G47.33 ICD-10]) Nocturnal Hypoxemia (327.26 [G47.36 ICD-10])  RECOMMENDATIONS Recommend a trial of Auto-BiPAP  I 6 - 18/ E 6-18 cm H2O. He wore a large [employer] Simplus full face mask with Easy-Breathe and heated humidity. Avoid alcohol, sedatives and other CNS depressants that may worsen sleep apnea and disrupt normal sleep architecture. Sleep hygiene should be reviewed to assess factors that may improve sleep quality. Weight management and regular exercise should be initiated or continued.    Waymon Budge Diplomate, American Board of Sleep Medicine  ELECTRONICALLY SIGNED ON:  03/14/2015, 4:23 PM [facility] PH: [phone]   FX: [phone] ACCREDITED BY THE AMERICAN ACADEMY OF SLEEP MEDICINE

## 2015-03-17 ENCOUNTER — Telehealth: Payer: Self-pay

## 2015-03-17 ENCOUNTER — Encounter (HOSPITAL_BASED_OUTPATIENT_CLINIC_OR_DEPARTMENT_OTHER): Payer: Self-pay | Admitting: Clinical

## 2015-03-17 DIAGNOSIS — Z789 Other specified health status: Secondary | ICD-10-CM

## 2015-03-17 DIAGNOSIS — G4730 Sleep apnea, unspecified: Secondary | ICD-10-CM

## 2015-03-17 NOTE — Progress Notes (Signed)
ASSESSMENT: Pt currently experiencing misunderstanding about health care bills because of language barrier; would benefit from financial counseling and financial health care navigation.  Stage of Change: action  PLAN: 1. F/U with behavioral health consultant in as needed 2. Psychiatric Medications: none. 3. Behavioral recommendation(s):   -Bring back [facility] Discount application w required documents -Come in to see Financial Counseling at next walk-in day -Pick up [facility] financial assistance application at next [facility] visit SUBJECTIVE: Pt. referred by self for help understanding financial paperwork:  Pt. reports the following symptoms/concerns: Pt does not understand why he is receiving bills from [facility] services and [facility] labs after receiving 100% [facility] Disc[facility]; wants to know if he will have to pay for surgery he is having in October, even with the discount. Pt says he will be taking English classes soon, and plans to become a [place] citizen in 6 months.  Duration of problem: about a month of bills Severity: mild  OBJECTIVE: Orientation & Cognition: Oriented x3. Thought processes normal and appropriate to situation. Mood: appropriate. Affect: appropriate Appearance: appropriate Risk of harm to self or others: no risk of harm to self or others Substance use: none Assessments administered: none  Diagnosis: Language barrier affecting health care CPT Code: Z78.9 -------------------------------------------- Other(s) present in the room: video interpreter  Time spent with patient in exam room: 30 minutes

## 2015-03-25 NOTE — Telephone Encounter (Signed)
Patient has been to clinic for urinalysis so he can go to urology. Per providers lab result note on 02/24/15 patient should be seeing urologist at [facility].

## 2015-04-06 ENCOUNTER — Ambulatory Visit: Payer: Self-pay

## 2015-04-24 ENCOUNTER — Ambulatory Visit: Payer: Self-pay | Admitting: Family Medicine

## 2015-05-27 ENCOUNTER — Ambulatory Visit: Payer: Self-pay | Attending: Internal Medicine | Admitting: Pharmacist

## 2015-05-27 DIAGNOSIS — Z23 Encounter for immunization: Secondary | ICD-10-CM | POA: Insufficient documentation

## 2015-05-27 MED ORDER — INFLUENZA VAC SPLIT QUAD 0.5 ML IM SUSY
0.5000 mL | PREFILLED_SYRINGE | Freq: Once | INTRAMUSCULAR | Status: AC
  Administered 2015-05-27: 0.5 mL via INTRAMUSCULAR

## 2015-05-27 NOTE — Patient Instructions (Signed)
Make an appointment with [facility] CommonsValerie Keck for cough

## 2015-06-10 ENCOUNTER — Encounter: Payer: Self-pay | Admitting: Internal Medicine

## 2015-06-10 ENCOUNTER — Ambulatory Visit: Payer: Self-pay | Attending: Internal Medicine | Admitting: Internal Medicine

## 2015-06-10 ENCOUNTER — Telehealth: Payer: Self-pay

## 2015-06-10 VITALS — BP 136/90 | HR 81 | Temp 97.8°F | Resp 17 | Ht 72.0 in | Wt 244.0 lb

## 2015-06-10 DIAGNOSIS — E785 Hyperlipidemia, unspecified: Secondary | ICD-10-CM | POA: Insufficient documentation

## 2015-06-10 DIAGNOSIS — R208 Other disturbances of skin sensation: Secondary | ICD-10-CM | POA: Insufficient documentation

## 2015-06-10 DIAGNOSIS — I10 Essential (primary) hypertension: Secondary | ICD-10-CM | POA: Insufficient documentation

## 2015-06-10 DIAGNOSIS — K219 Gastro-esophageal reflux disease without esophagitis: Secondary | ICD-10-CM | POA: Insufficient documentation

## 2015-06-10 DIAGNOSIS — J069 Acute upper respiratory infection, unspecified: Secondary | ICD-10-CM | POA: Insufficient documentation

## 2015-06-10 DIAGNOSIS — Z79899 Other long term (current) drug therapy: Secondary | ICD-10-CM | POA: Insufficient documentation

## 2015-06-10 DIAGNOSIS — Z7982 Long term (current) use of aspirin: Secondary | ICD-10-CM | POA: Insufficient documentation

## 2015-06-10 DIAGNOSIS — Z8249 Family history of ischemic heart disease and other diseases of the circulatory system: Secondary | ICD-10-CM | POA: Insufficient documentation

## 2015-06-10 DIAGNOSIS — G4730 Sleep apnea, unspecified: Secondary | ICD-10-CM | POA: Insufficient documentation

## 2015-06-10 DIAGNOSIS — R200 Anesthesia of skin: Secondary | ICD-10-CM | POA: Insufficient documentation

## 2015-06-10 DIAGNOSIS — R0602 Shortness of breath: Secondary | ICD-10-CM | POA: Insufficient documentation

## 2015-06-10 MED ORDER — AMOXICILLIN-POT CLAVULANATE 875-125 MG PO TABS: 1.0000 | ORAL_TABLET | Freq: Two times a day (BID) | ORAL | Status: DC

## 2015-06-10 MED ORDER — BENZONATATE 200 MG PO CAPS: 200.0000 mg | ORAL_CAPSULE | Freq: Two times a day (BID) | ORAL | Status: DC | PRN

## 2015-06-10 NOTE — Progress Notes (Signed)
Patient ID: Brandon Clark, male   DOB: 1940/12/04, 74 y.o.   MRN: [id number]  CC: cold symptoms and facial numbness  HPI: Bartley Vuolo is a 74 y.o. male here today for a follow up visit.  Patient has past medical history of GERD, sleep apnea, HTN, and HLD. Patient reports that he has been having symptoms of cough, nasal congestion, rhinitis, lightheadedness with heavy coughing, chest congestion. No sick contacts. Some SOB and wheezing at night. He reports that he has yet to begin using his CPAP machine because he only received the mask and not the machine. He states that the dry cough has been present for 1.5 months now. He has used OTC cough suppresant with little relief.  Facial numbness started 3 months ago. Left sided facial numbness was intermittent occuring only once per week but over the past 2 weeks he has been having episodes 7-8 times per day. Not associated with any activity. Numbness that does not radiate to arms. Massage of the area improves numbness. He is concerned about memory changes as well.  No Known Allergies Past Medical History  Diagnosis Date  . Acid reflux   . Ulcer   . Sleep apnea   . Hypertension   . Hyperlipidemia    Current Outpatient Prescriptions on File Prior to Visit  Medication Sig Dispense Refill  . acetaminophen-codeine (TYLENOL #3) 300-30 MG per tablet Take 1 tablet by mouth 2 (two) times daily as needed for moderate pain. 30 tablet 0  . aspirin EC 81 MG EC tablet Take 1 tablet (81 mg total) by mouth daily. 30 tablet 0  . hydrochlorothiazide (HYDRODIURIL) 12.5 MG tablet Take 1 tablet (12.5 mg total) by mouth daily. 90 tablet 4  . Hyoscyamine Sulfate 0.375 MG TBCR One tab twice a day  as needed, abdominal pain. 60 tablet 1  . meclizine (ANTIVERT) 50 MG tablet Take 0.5 tablets (25 mg total) by mouth 3 (three) times daily as needed for dizziness or nausea. 30 tablet 1  . nitroGLYCERIN (NITRODUR - DOSED IN MG/24 HR) 0.2 mg/hr patch 1/4th patch over  affected shoulder, change daily (Patient not taking: Reported on 01/26/2015) 30 patch 1  . pantoprazole (PROTONIX) 40 MG tablet Take 1 tablet (40 mg total) by mouth daily. 90 tablet 7  . simvastatin (ZOCOR) 10 MG tablet Take 1 tablet (10 mg total) by mouth at bedtime. 90 tablet 3   No current facility-administered medications on file prior to visit.   Family History  Problem Relation Age of Onset  . Diabetes Mother   . Diabetes Brother   . Heart attack Neg Hx   . Hyperlipidemia Neg Hx   . Hypertension Neg Hx   . Sudden death Neg Hx    Social History   Social History  . Marital Status: Married    Spouse Name: N/A  . Number of Children: 7  . Years of Education: N/A   Occupational History  . Engineer    Social History Main Topics  . Smoking status: Never Smoker   . Smokeless tobacco: Never Used  . Alcohol Use: No  . Drug Use: No  . Sexual Activity: No   Other Topics Concern  . Not on file   Social History Narrative    Review of Systems: Other than what is stated in HPI, all other systems are negative.   Objective:   Filed Vitals:   06/10/15 1151  BP: 136/90  Pulse: 81  Temp: 97.8 F (36.6 C)  Resp: 17  Physical Exam  Constitutional: He is oriented to person, place, and time.  Eyes: EOM are normal.  Neck:  No bruits noted   Cardiovascular: Normal rate, regular rhythm and normal heart sounds.   Pulmonary/Chest: Effort normal and breath sounds normal.  Musculoskeletal: He exhibits no edema.  Neurological: He is alert and oriented to person, place, and time. No cranial nerve deficit. Coordination normal.  Skin: Skin is warm and dry.  Psychiatric: He has a normal mood and affect.     Lab Results  Component Value Date   WBC 4.0 06/30/2014   HGB 18.0* 06/30/2014   HCT 53.0* 06/30/2014   MCV 86.0 06/30/2014   PLT 204 06/30/2014   Lab Results  Component Value Date   CREATININE 1.21 12/15/2014   BUN 16 12/15/2014   NA 139 12/15/2014   K 4.6  12/15/2014   CL 102 12/15/2014   CO2 27 12/15/2014    Lab Results  Component Value Date   HGBA1C 6.3* 06/30/2014   Lipid Panel     Component Value Date/Time   CHOL 159 07/01/2014 0638   TRIG 133 07/01/2014 0638   HDL 38* 07/01/2014 0638   CHOLHDL 4.2 07/01/2014 0638   VLDL 27 07/01/2014 0638   LDLCALC 94 07/01/2014 0638       Assessment and plan:   Maxen was seen today for cough.  Diagnoses and all orders for this visit:  URI (upper respiratory infection) -     amoxicillin-clavulanate (AUGMENTIN) 875-125 MG tablet; Take 1 tablet by mouth 2 (two) times daily. -     benzonatate (TESSALON) 200 MG capsule; Take 1 capsule (200 mg total) by mouth 2 (two) times daily as needed for cough.  Facial numbness -     US Carotid Bilateral; Future I will make sure patient has good blood flow and no stenosis of artery. He will continue his daily aspirin. Explained that some numbness may also be a result of anxiety  Due to language barrier, an interpreter was present during the history-taking and subsequent discussion (and for part of the physical exam) with this patient.  Return if symptoms worsen or fail to improve.      Ambrose FinlandValerie A Horace Wishon, NP-C [facility] [phone] 06/10/2015, 12:12 PM

## 2015-06-10 NOTE — Progress Notes (Signed)
Virtual interpreter used [employer] ID# [id number] Patient complains of having cold like symptoms for the past month Cough congestion Patient also stated when he coughs really hard he gets light headed Patient also complains of numbness and tingling to the left side of her face

## 2015-06-10 NOTE — Telephone Encounter (Signed)
Interpreter line used [employer] ID# [id number] Attempted to contact patient to let him know his US of his carotid is scheduled for tomorrow 12/8 @ [facility] Patient was not available Message was left on voice mail to return our call

## 2015-06-11 ENCOUNTER — Ambulatory Visit (HOSPITAL_COMMUNITY)
Admission: RE | Admit: 2015-06-11 | Discharge: 2015-06-11 | Disposition: A | Discharge status: Home / Self Care | Payer: Self-pay | Source: Ambulatory Visit | Attending: Internal Medicine | Admitting: Internal Medicine

## 2015-06-11 DIAGNOSIS — I10 Essential (primary) hypertension: Secondary | ICD-10-CM | POA: Insufficient documentation

## 2015-06-11 DIAGNOSIS — R208 Other disturbances of skin sensation: Secondary | ICD-10-CM | POA: Insufficient documentation

## 2015-06-11 DIAGNOSIS — E785 Hyperlipidemia, unspecified: Secondary | ICD-10-CM | POA: Insufficient documentation

## 2015-06-11 DIAGNOSIS — R200 Anesthesia of skin: Secondary | ICD-10-CM

## 2015-06-11 NOTE — Progress Notes (Addendum)
VASCULAR LAB PRELIMINARY  PRELIMINARY  PRELIMINARY  PRELIMINARY  Carotid duplex completed.    Preliminary report:  Bilateral:  No obvious evidence of ICA stenosis.  Vertebral artery flow is antegrade.     Kamerin Axford, RVS 06/11/2015, 2:51 PM

## 2015-06-15 ENCOUNTER — Telehealth: Payer: Self-pay

## 2015-06-15 NOTE — Telephone Encounter (Signed)
Called patient this am Patient not available Message left on voice mail to return our call 

## 2015-06-15 NOTE — Telephone Encounter (Signed)
-----   Message from Ambrose FinlandValerie A Keck, NP sent at 06/14/2015  8:16 PM EST ----- Doppler was negative

## 2015-06-30 NOTE — Telephone Encounter (Signed)
error 

## 2015-07-08 ENCOUNTER — Ambulatory Visit: Payer: Self-pay | Attending: Internal Medicine

## 2015-07-08 DIAGNOSIS — S61210A Laceration without foreign body of right index finger without damage to nail, initial encounter: Secondary | ICD-10-CM | POA: Insufficient documentation

## 2015-07-08 DIAGNOSIS — W260XXA Contact with knife, initial encounter: Secondary | ICD-10-CM | POA: Insufficient documentation

## 2015-07-08 DIAGNOSIS — S61219A Laceration without foreign body of unspecified finger without damage to nail, initial encounter: Secondary | ICD-10-CM

## 2015-07-08 NOTE — Progress Notes (Signed)
Patient walked in to clinic asking for a band aid He states he cut his finger with a knife yesterday Two RN's unwrapped right pointer finger and noticed an approximately 5cm laceration RN cleaned wound with wound cleaner, applied triple antibiotic cream and secured with fresh gauze and tape Explained to patient to keep wound clean and dry and to change dressing once per day until healed Patient was given dressing change supplies Patient up to date on his Tetanus shot

## 2015-07-08 NOTE — Patient Instructions (Signed)
Please keep dressing clean, dry, and intact Change dressing every day until healed Please return to clinic if wound does not improve after 5 days

## 2015-07-14 ENCOUNTER — Ambulatory Visit: Payer: Self-pay | Admitting: Internal Medicine

## 2015-07-20 ENCOUNTER — Ambulatory Visit: Payer: Self-pay | Attending: Internal Medicine

## 2015-07-22 ENCOUNTER — Encounter: Payer: Self-pay | Admitting: Internal Medicine

## 2015-07-22 ENCOUNTER — Ambulatory Visit: Payer: Self-pay | Attending: Internal Medicine | Admitting: Internal Medicine

## 2015-07-22 VITALS — BP 118/76 | HR 96 | Temp 98.0°F | Resp 17 | Ht 72.0 in | Wt 244.6 lb

## 2015-07-22 DIAGNOSIS — G4730 Sleep apnea, unspecified: Secondary | ICD-10-CM | POA: Insufficient documentation

## 2015-07-22 DIAGNOSIS — K219 Gastro-esophageal reflux disease without esophagitis: Secondary | ICD-10-CM | POA: Insufficient documentation

## 2015-07-22 DIAGNOSIS — H8110 Benign paroxysmal vertigo, unspecified ear: Secondary | ICD-10-CM

## 2015-07-22 DIAGNOSIS — J069 Acute upper respiratory infection, unspecified: Secondary | ICD-10-CM | POA: Insufficient documentation

## 2015-07-22 DIAGNOSIS — E785 Hyperlipidemia, unspecified: Secondary | ICD-10-CM | POA: Insufficient documentation

## 2015-07-22 DIAGNOSIS — R05 Cough: Secondary | ICD-10-CM | POA: Insufficient documentation

## 2015-07-22 DIAGNOSIS — Z7982 Long term (current) use of aspirin: Secondary | ICD-10-CM | POA: Insufficient documentation

## 2015-07-22 DIAGNOSIS — R42 Dizziness and giddiness: Secondary | ICD-10-CM | POA: Insufficient documentation

## 2015-07-22 DIAGNOSIS — Z79899 Other long term (current) drug therapy: Secondary | ICD-10-CM | POA: Insufficient documentation

## 2015-07-22 DIAGNOSIS — I10 Essential (primary) hypertension: Secondary | ICD-10-CM

## 2015-07-22 DIAGNOSIS — R252 Cramp and spasm: Secondary | ICD-10-CM

## 2015-07-22 DIAGNOSIS — J209 Acute bronchitis, unspecified: Secondary | ICD-10-CM

## 2015-07-22 LAB — CBC
HEMATOCRIT: 49.3 % (ref 39.0–52.0)
HEMOGLOBIN: 16.3 g/dL (ref 13.0–17.0)
MCH: 28.6 pg (ref 26.0–34.0)
MCHC: 33.1 g/dL (ref 30.0–36.0)
MCV: 86.6 fL (ref 78.0–100.0)
MPV: 11.4 fL (ref 8.6–12.4)
Platelets: 247 10*3/uL (ref 150–400)
RBC: 5.69 MIL/uL (ref 4.22–5.81)
RDW: 13.7 % (ref 11.5–15.5)
WBC: 4.9 10*3/uL (ref 4.0–10.5)

## 2015-07-22 MED ORDER — ALBUTEROL SULFATE HFA 108 (90 BASE) MCG/ACT IN AERS: 2.0000 | INHALATION_SPRAY | Freq: Four times a day (QID) | RESPIRATORY_TRACT | Status: DC | PRN

## 2015-07-22 MED ORDER — HYDROCHLOROTHIAZIDE 12.5 MG PO TABS: 12.5000 mg | ORAL_TABLET | Freq: Every day | ORAL | Status: DC

## 2015-07-22 MED ORDER — GUAIFENESIN ER 600 MG PO TB12: 600.0000 mg | ORAL_TABLET | Freq: Two times a day (BID) | ORAL | Status: DC | PRN

## 2015-07-22 MED FILL — HYDROCHLOROTHIAZIDE 12.5 MG: 12.5 | 90 days supply | Qty: 90 | Fill #0

## 2015-07-22 MED FILL — VENTOLIN HFA 90 MCG INHALER: 108 (90 BAS | 30 days supply | Qty: 1 | Fill #0

## 2015-07-22 NOTE — Progress Notes (Addendum)
Patient ID: Brandon Clark, male   DOB: October 27, 1940, 75 y.o.   MRN: [id number]  CC: col symptoms  HPI: Brandon Clark is a 75 y.o. male here today for a follow up visit.  Patient has past medical history of HTN, HLD, and GERD. Patient reports that he was seen here last month and was given cough medication with antibiotics and has not since improved. He reports symptoms of cough, rhinitis, and yellow mucous production. He states that when he awakens in the morning he feels congested and has to cough up large amounts of mucous. He has tried OTC Dayquil as well. He coughs all day and reports that when he coughs hard it makes him dizzy and feel like he will pass out.  He continues to report dizziness for the past 3-4 months. Intermittent. Coughing aggravated dizziness and changing positions too quickly.  Right hand cramps for past for several years. Cramps are intermittent and extend to his wrist. Slight numbness.  No Known Allergies Past Medical History  Diagnosis Date  . Acid reflux   . Ulcer   . Sleep apnea   . Hypertension   . Hyperlipidemia    Current Outpatient Prescriptions on File Prior to Visit  Medication Sig Dispense Refill  . acetaminophen-codeine (TYLENOL #3) 300-30 MG per tablet Take 1 tablet by mouth 2 (two) times daily as needed for moderate pain. 30 tablet 0  . aspirin EC 81 MG EC tablet Take 1 tablet (81 mg total) by mouth daily. 30 tablet 0  . hydrochlorothiazide (HYDRODIURIL) 12.5 MG tablet Take 1 tablet (12.5 mg total) by mouth daily. 90 tablet 4  . meclizine (ANTIVERT) 50 MG tablet Take 0.5 tablets (25 mg total) by mouth 3 (three) times daily as needed for dizziness or nausea. 30 tablet 1  . pantoprazole (PROTONIX) 40 MG tablet Take 1 tablet (40 mg total) by mouth daily. 90 tablet 7  . simvastatin (ZOCOR) 10 MG tablet Take 1 tablet (10 mg total) by mouth at bedtime. 90 tablet 3  . amoxicillin-clavulanate (AUGMENTIN) 875-125 MG tablet Take 1 tablet by mouth 2 (two)  times daily. 14 tablet 0  . benzonatate (TESSALON) 200 MG capsule Take 1 capsule (200 mg total) by mouth 2 (two) times daily as needed for cough. 30 capsule 1  . Hyoscyamine Sulfate 0.375 MG TBCR One tab twice a day  as needed, abdominal pain. 60 tablet 1  . nitroGLYCERIN (NITRODUR - DOSED IN MG/24 HR) 0.2 mg/hr patch 1/4th patch over affected shoulder, change daily (Patient not taking: Reported on 01/26/2015) 30 patch 1   No current facility-administered medications on file prior to visit.   Family History  Problem Relation Age of Onset  . Diabetes Mother   . Diabetes Brother   . Heart attack Neg Hx   . Hyperlipidemia Neg Hx   . Hypertension Neg Hx   . Sudden death Neg Hx    Social History   Social History  . Marital Status: Married    Spouse Name: N/A  . Number of Children: 7  . Years of Education: N/A   Occupational History  . Engineer    Social History Main Topics  . Smoking status: Never Smoker   . Smokeless tobacco: Never Used  . Alcohol Use: No  . Drug Use: No  . Sexual Activity: No   Other Topics Concern  . Not on file   Social History Narrative    Review of Systems: Other than what is stated in HPI, all other systems  are negative.   Objective:   Filed Vitals:   07/22/15 1627  BP: 118/76  Pulse: 96  Temp: 98 F (36.7 C)  Resp: 17    Physical Exam  Constitutional: He is oriented to person, place, and time.  HENT:  Right Ear: External ear normal.  Left Ear: External ear normal.  Mouth/Throat: Oropharynx is clear and moist.  Eyes: Right eye exhibits no discharge. Left eye exhibits no discharge.  Cardiovascular: Normal rate, regular rhythm and normal heart sounds.   Pulmonary/Chest: Effort normal and breath sounds normal.  Lymphadenopathy:    He has no cervical adenopathy.  Neurological: He is alert and oriented to person, place, and time.  Skin: Skin is warm and dry.     Lab Results  Component Value Date   WBC 4.0 06/30/2014   HGB 18.0*  06/30/2014   HCT 53.0* 06/30/2014   MCV 86.0 06/30/2014   PLT 204 06/30/2014   Lab Results  Component Value Date   CREATININE 1.21 12/15/2014   BUN 16 12/15/2014   NA 139 12/15/2014   K 4.6 12/15/2014   CL 102 12/15/2014   CO2 27 12/15/2014    Lab Results  Component Value Date   HGBA1C 6.3* 06/30/2014   Lipid Panel     Component Value Date/Time   CHOL 159 07/01/2014 0638   TRIG 133 07/01/2014 0638   HDL 38* 07/01/2014 0638   CHOLHDL 4.2 07/01/2014 0638   VLDL 27 07/01/2014 0638   LDLCALC 94 07/01/2014 0638       Assessment and plan:   Brandon Clark was seen today for uri.  Diagnoses and all orders for this visit:  Acute bronchitis, unspecified organism -     CBC -     albuterol (PROVENTIL HFA;VENTOLIN HFA) 108 (90 Base) MCG/ACT inhaler; Inhale 2 puffs into the lungs every 6 (six) hours as needed for wheezing or shortness of breath. -     guaiFENesin (MUCINEX) 600 MG 12 hr tablet; Take 1 tablet (600 mg total) by mouth 2 (two) times daily as needed for cough or to loosen phlegm.  BPV (benign positional vertigo), unspecified laterality Continue Meclizine, stay hydrated, and change positions slowly  Hand cramps -     Basic Metabolic Panel Will check potassium level  Essential hypertension -     hydrochlorothiazide (HYDRODIURIL) 12.5 MG tablet; Take 1 tablet (12.5 mg total) by mouth daily.  Return if symptoms worsen or fail to improve.        Ambrose Finland, NP-C [facility] [phone] 07/22/2015, 4:38 PM

## 2015-07-22 NOTE — Progress Notes (Signed)
Interpreter line used [place] ID# [id number] Patient complains of having cold symptoms-cough congestion With some dizziness and vertigo Patient also complains of some cramping to his right hand

## 2015-07-23 ENCOUNTER — Telehealth: Payer: Self-pay

## 2015-07-23 LAB — BASIC METABOLIC PANEL
BUN: 19 mg/dL (ref 7–25)
CALCIUM: 9.4 mg/dL (ref 8.6–10.3)
CO2: 26 mmol/L (ref 20–31)
Chloride: 103 mmol/L (ref 98–110)
Creat: 1.15 mg/dL (ref 0.70–1.18)
GLUCOSE: 108 mg/dL — AB (ref 65–99)
POTASSIUM: 4.6 mmol/L (ref 3.5–5.3)
Sodium: 138 mmol/L (ref 135–146)

## 2015-07-23 NOTE — Telephone Encounter (Signed)
-----   Message from Ambrose Finland, NP sent at 07/23/2015  9:01 AM EST ----- Labs normal

## 2015-07-23 NOTE — Telephone Encounter (Signed)
Interpreter line used [place] ID# [id number] Tried to contact patient  Patient not available Message was left on voice mail to return our call

## 2015-08-12 ENCOUNTER — Institutional Professional Consult (permissible substitution): Payer: Self-pay | Admitting: Internal Medicine

## 2015-08-14 MED FILL — PANTOPRAZOLE SOD DR 40 MG T: 40 | 90 days supply | Qty: 90 | Fill #2

## 2015-08-19 ENCOUNTER — Ambulatory Visit: Payer: Self-pay | Attending: Internal Medicine

## 2015-09-19 LAB — GLUCOSE, POCT (MANUAL RESULT ENTRY): POC GLUCOSE: 105 mg/dL — AB (ref 70–99)

## 2015-11-11 ENCOUNTER — Other Ambulatory Visit: Payer: Self-pay | Admitting: Internal Medicine

## 2015-11-20 ENCOUNTER — Ambulatory Visit: Payer: Self-pay | Admitting: Family Medicine

## 2015-12-02 MED FILL — ?PANTOPRAZOLE SOD DR 40MG: 40 MG | 30 days supply | Qty: 30 | Fill #0

## 2015-12-18 ENCOUNTER — Telehealth: Payer: Self-pay | Admitting: Family Medicine

## 2015-12-18 ENCOUNTER — Encounter: Payer: Self-pay | Admitting: Family Medicine

## 2015-12-18 ENCOUNTER — Ambulatory Visit: Payer: Self-pay | Attending: Family Medicine | Admitting: Family Medicine

## 2015-12-18 VITALS — BP 126/89 | HR 92 | Temp 98.4°F | Resp 16 | Ht 72.0 in | Wt 239.2 lb

## 2015-12-18 DIAGNOSIS — K2990 Gastroduodenitis, unspecified, without bleeding: Secondary | ICD-10-CM

## 2015-12-18 DIAGNOSIS — Z79899 Other long term (current) drug therapy: Secondary | ICD-10-CM | POA: Insufficient documentation

## 2015-12-18 DIAGNOSIS — G4733 Obstructive sleep apnea (adult) (pediatric): Secondary | ICD-10-CM | POA: Insufficient documentation

## 2015-12-18 DIAGNOSIS — I10 Essential (primary) hypertension: Secondary | ICD-10-CM

## 2015-12-18 DIAGNOSIS — E785 Hyperlipidemia, unspecified: Secondary | ICD-10-CM

## 2015-12-18 DIAGNOSIS — K429 Umbilical hernia without obstruction or gangrene: Secondary | ICD-10-CM

## 2015-12-18 DIAGNOSIS — K2970 Gastritis, unspecified, without bleeding: Secondary | ICD-10-CM

## 2015-12-18 DIAGNOSIS — H8110 Benign paroxysmal vertigo, unspecified ear: Secondary | ICD-10-CM

## 2015-12-18 DIAGNOSIS — Z7982 Long term (current) use of aspirin: Secondary | ICD-10-CM | POA: Insufficient documentation

## 2015-12-18 LAB — COMPLETE METABOLIC PANEL WITH GFR
ALBUMIN: 4.2 g/dL (ref 3.6–5.1)
ALK PHOS: 56 U/L (ref 40–115)
ALT: 21 U/L (ref 9–46)
AST: 18 U/L (ref 10–35)
BILIRUBIN TOTAL: 1 mg/dL (ref 0.2–1.2)
BUN: 20 mg/dL (ref 7–25)
CO2: 26 mmol/L (ref 20–31)
Calcium: 9 mg/dL (ref 8.6–10.3)
Chloride: 105 mmol/L (ref 98–110)
Creat: 1.1 mg/dL (ref 0.70–1.18)
GFR, EST NON AFRICAN AMERICAN: 66 mL/min (ref >=60)
GFR, Est African American: 77 mL/min (ref >=60)
GLUCOSE: 112 mg/dL — AB (ref 65–99)
POTASSIUM: 3.6 mmol/L (ref 3.5–5.3)
SODIUM: 141 mmol/L (ref 135–146)
TOTAL PROTEIN: 6.8 g/dL (ref 6.1–8.1)

## 2015-12-18 LAB — LIPID PANEL
CHOL/HDL RATIO: 3.9 ratio (ref <=5.0)
Cholesterol: 184 mg/dL (ref 125–200)
HDL: 47 mg/dL (ref >=40)
LDL CALC: 124 mg/dL (ref <130)
Triglycerides: 66 mg/dL (ref <150)
VLDL: 13 mg/dL (ref <30)

## 2015-12-18 MED ORDER — MECLIZINE HCL 25 MG PO TABS: 25.0000 mg | ORAL_TABLET | Freq: Two times a day (BID) | ORAL | Status: DC | PRN

## 2015-12-18 MED ORDER — SIMVASTATIN 10 MG PO TABS: 10.0000 mg | ORAL_TABLET | Freq: Every day | ORAL | Status: DC

## 2015-12-18 MED ORDER — HYDROCHLOROTHIAZIDE 12.5 MG PO TABS: 12.5000 mg | ORAL_TABLET | Freq: Every day | ORAL | Status: DC

## 2015-12-18 MED ORDER — PANTOPRAZOLE SODIUM 40 MG PO TBEC: 40.0000 mg | DELAYED_RELEASE_TABLET | Freq: Every day | ORAL | Status: DC

## 2015-12-18 MED FILL — ?SIMVASTATIN 10 MG TABLET: 10 | 30 days supply | Qty: 30 | Fill #0

## 2015-12-18 MED FILL — ?HYDROCHLOROTHIAZIDE 12.5MG: 12.5 | 30 days supply | Qty: 30 | Fill #0

## 2015-12-18 NOTE — Progress Notes (Signed)
Dr. Jarold Song indicated patient needing a CPAP machine. Sleep study done 9/16. Patient uninsured and will need to get CPAP machine through [employer]-CPAP Assistance Program. Met with patient to provide information about CPAP Assistance Program including out of pocket cost. Patient verbalized understanding, agreeable to out of pocket cost, and indicated he would like to pursue obtaining CPAP through CPAP Assistance Program. Dr. Jarold Song to complete form and then application will need to be faxed to [phone].

## 2015-12-18 NOTE — Progress Notes (Signed)
Pt is here for abdominal pain rated as a 6.  Pt describes pain as burning. Pt also reports falling asleep during the day and has trouble sleeping at night. Pt needs refill for simvastatin and hctz.  Pt requests change in HTN medication.

## 2015-12-18 NOTE — Progress Notes (Signed)
Subjective:  Patient ID: Brandon Clark, male    DOB: [birth year]  Age: 75 y.o. MRN: [id number]  CC: Abdominal Pain   HPI Brandon Clark is a 75 year old male with a history of hypertension, hyperlipidemia, vertigo who comes into the clinic to establish care with me.  He informs me he would like to change his blood pressure medication because his blood pressure is uncontrolled as evidenced by elevated systolic pressures of 160-180 which were taken by [facility] at an event. Of note the patient had been out of his antihypertensives at that time.  Complaints of abdominal pain and epigastric burning; he was scheduled to have hernia surgery at [facility] in the fall of last year but missed the appointment. Denies nausea, vomiting, diarrhea or constipation.  Complains of daytime somnolence and insomnia; review of his chart indicates he had a sleep study in 03/2015 with the diagnoses of sleep apnea with AHI of 68.7 and he was titrated to a final bilevel pressure of 18/14 cm of water. Has been out of meclizine and still experiences dizziness.   Past Medical History  Diagnosis Date  . Acid reflux   . Ulcer   . Sleep apnea   . Hypertension   . Hyperlipidemia     Past Surgical History  Procedure Laterality Date  . Facial lacerations repair      No Known Allergies  Outpatient Prescriptions Prior to Visit  Medication Sig Dispense Refill  . hydrochlorothiazide (HYDRODIURIL) 12.5 MG tablet Take 1 tablet (12.5 mg total) by mouth daily. 90 tablet 4  . pantoprazole (PROTONIX) 40 MG tablet TAKE 1 TABLET BY MOUTH DAILY 90 tablet 0  . simvastatin (ZOCOR) 10 MG tablet Take 1 tablet (10 mg total) by mouth at bedtime. 90 tablet 3  . albuterol (PROVENTIL HFA;VENTOLIN HFA) 108 (90 Base) MCG/ACT inhaler Inhale 2 puffs into the lungs every 6 (six) hours as needed for wheezing or shortness of breath. 1 Inhaler 0  . aspirin EC 81 MG EC tablet Take 1 tablet (81 mg total) by mouth daily. 30 tablet 0  .  benzonatate (TESSALON) 200 MG capsule Take 1 capsule (200 mg total) by mouth 2 (two) times daily as needed for cough. 30 capsule 1  . guaiFENesin (MUCINEX) 600 MG 12 hr tablet Take 1 tablet (600 mg total) by mouth 2 (two) times daily as needed for cough or to loosen phlegm. 30 tablet 1  . Hyoscyamine Sulfate 0.375 MG TBCR One tab twice a day  as needed, abdominal pain. 60 tablet 1  . meclizine (ANTIVERT) 50 MG tablet Take 0.5 tablets (25 mg total) by mouth 3 (three) times daily as needed for dizziness or nausea. 30 tablet 1  . nitroGLYCERIN (NITRODUR - DOSED IN MG/24 HR) 0.2 mg/hr patch 1/4th patch over affected shoulder, change daily (Patient not taking: Reported on 01/26/2015) 30 patch 1  . acetaminophen-codeine (TYLENOL #3) 300-30 MG per tablet Take 1 tablet by mouth 2 (two) times daily as needed for moderate pain. 30 tablet 0  . amoxicillin-clavulanate (AUGMENTIN) 875-125 MG tablet Take 1 tablet by mouth 2 (two) times daily. 14 tablet 0   No facility-administered medications prior to visit.    ROS Review of Systems  Constitutional: Negative for activity change and appetite change.  HENT: Negative for sinus pressure and sore throat.   Eyes: Negative for visual disturbance.  Respiratory: Negative for cough, chest tightness and shortness of breath.   Cardiovascular: Negative for chest pain and leg swelling.  Gastrointestinal: Positive for abdominal pain.  Negative for diarrhea, constipation and abdominal distention.  Endocrine: Negative.   Genitourinary: Negative for dysuria.  Musculoskeletal: Negative for myalgias and joint swelling.  Skin: Negative for rash.  Allergic/Immunologic: Negative.   Neurological: Negative for weakness, light-headedness and numbness.  Psychiatric/Behavioral: Positive for sleep disturbance. Negative for suicidal ideas and dysphoric mood.    Objective:  BP 126/89 mmHg  Pulse 92  Temp(Src) 98.4 F (36.9 C) (Oral)  Resp 16  Ht 6' (1.829 m)  Wt 239 lb 3.2 oz  (108.5 kg)  BMI 32.43 kg/m2  SpO2 93%  BP/Weight 12/18/2015 09/19/2015 07/22/2015  Systolic BP 126 160 118  Diastolic BP 89 107 76  Wt. (Lbs) 239.2 235 244.6  BMI 32.43 31.86 33.17      Physical Exam  Constitutional: He is oriented to person, place, and time. He appears well-developed and well-nourished.  Cardiovascular: Normal rate, normal heart sounds and intact distal pulses.   No murmur heard. Pulmonary/Chest: Effort normal and breath sounds normal. He has no wheezes. He has no rales. He exhibits no tenderness.  Abdominal: Soft. Bowel sounds are normal. He exhibits mass (Ventral and umbilical hernia Mildly tender, reducible). He exhibits no distension. There is no tenderness.  Musculoskeletal: Normal range of motion.  Neurological: He is alert and oriented to person, place, and time.     Assessment & Plan:   1. Gastritis and gastroduodenitis - pantoprazole (PROTONIX) 40 MG tablet; Take 1 tablet (40 mg total) by mouth daily.  Dispense: 30 tablet; Refill: 3  2. Essential hypertension Controlled He likely had uncontrolled blood pressure at the time when he was out of her antihypertensives but he is convinced his blood pressure medication is not working so I will see him back at his next visit to reassess his blood pressure - hydrochlorothiazide (HYDRODIURIL) 12.5 MG tablet; Take 1 tablet (12.5 mg total) by mouth daily.  Dispense: 30 tablet; Refill: 4 - COMPLETE METABOLIC PANEL WITH GFR - Lipid panel  3. HLD (hyperlipidemia) - simvastatin (ZOCOR) 10 MG tablet; Take 1 tablet (10 mg total) by mouth at bedtime.  Dispense: 30 tablet; Refill: 4  4. BPV (benign positional vertigo), unspecified laterality Refilled meclizine-side effects discussed  5. Umbilical hernia without obstruction and without gangrene Was scheduled [facility] surgery at [facility] but missed appointment. He has been advised to call back to reschedule and I have pointed out the number to him from one of his  documents.  6. Obstructive sleep apnea Reviewed sleep study and he did better on BiPAP however this is unavailable through financial assistance. I have discussed with him the fact that the lack of medical coverage could prolong his obtaining a CPAP machine. Advised against driving while somnolent  Meds ordered this encounter  Medications  . hydrochlorothiazide (HYDRODIURIL) 12.5 MG tablet    Sig: Take 1 tablet (12.5 mg total) by mouth daily.    Dispense:  30 tablet    Refill:  4  . simvastatin (ZOCOR) 10 MG tablet    Sig: Take 1 tablet (10 mg total) by mouth at bedtime.    Dispense:  30 tablet    Refill:  4  . pantoprazole (PROTONIX) 40 MG tablet    Sig: Take 1 tablet (40 mg total) by mouth daily.    Dispense:  30 tablet    Refill:  3    Follow-up: Return in about 3 weeks (around 01/08/2016) for Follow-up on hypertension.   Jaclyn ShaggyEnobong Amao MD

## 2015-12-18 NOTE — Telephone Encounter (Signed)
Form for C-PAP machine completed by Jill PolingJessica B., RN and placed on MD's desk for review and signature.

## 2015-12-18 NOTE — Patient Instructions (Signed)
Sleep Apnea  Sleep apnea is a sleep disorder characterized by abnormal pauses in breathing while you sleep. When your breathing pauses, the level of oxygen in your blood decreases. This causes you to move out of deep sleep and into light sleep. As a result, your quality of sleep is poor, and the system that carries your blood throughout your body (cardiovascular system) experiences stress. If sleep apnea remains untreated, the following conditions can develop:  High blood pressure (hypertension).  Coronary artery disease.  Inability to achieve or maintain an erection (impotence).  Impairment of your thought process (cognitive dysfunction). There are three types of sleep apnea: 1. Obstructive sleep apnea--Pauses in breathing during sleep because of a blocked airway. 2. Central sleep apnea--Pauses in breathing during sleep because the area of the brain that controls your breathing does not send the correct signals to the muscles that control breathing. 3. Mixed sleep apnea--A combination of both obstructive and central sleep apnea. RISK FACTORS The following risk factors can increase your risk of developing sleep apnea:  Being overweight.  Smoking.  Having narrow passages in your nose and throat.  Being of older age.  Being male.  Alcohol use.  Sedative and tranquilizer use.  Ethnicity. Among individuals younger than 35 years, African Americans are at increased risk of sleep apnea. SYMPTOMS   Difficulty staying asleep.  Daytime sleepiness and fatigue.  Loss of energy.  Irritability.  Loud, heavy snoring.  Morning headaches.  Trouble concentrating.  Forgetfulness.  Decreased interest in sex.  Unexplained sleepiness. DIAGNOSIS  In order to diagnose sleep apnea, your caregiver will perform a physical examination. A sleep study done in the comfort of your own home may be appropriate if you are otherwise healthy. Your caregiver may also recommend that you spend the  night in a sleep lab. In the sleep lab, several monitors record information about your heart, lungs, and brain while you sleep. Your leg and arm movements and blood oxygen level are also recorded. TREATMENT The following actions may help to resolve mild sleep apnea:  Sleeping on your side.   Using a decongestant if you have nasal congestion.   Avoiding the use of depressants, including alcohol, sedatives, and narcotics.   Losing weight and modifying your diet if you are overweight. There also are devices and treatments to help open your airway:  Oral appliances. These are custom-made mouthpieces that shift your lower jaw forward and slightly open your bite. This opens your airway.  Devices that create positive airway pressure. This positive pressure "splints" your airway open to help you breathe better during sleep. The following devices create positive airway pressure:  Continuous positive airway pressure (CPAP) device. The CPAP device creates a continuous level of air pressure with an air pump. The air is delivered to your airway through a mask while you sleep. This continuous pressure keeps your airway open.  Nasal expiratory positive airway pressure (EPAP) device. The EPAP device creates positive air pressure as you exhale. The device consists of single-use valves, which are inserted into each nostril and held in place by adhesive. The valves create very little resistance when you inhale but create much more resistance when you exhale. That increased resistance creates the positive airway pressure. This positive pressure while you exhale keeps your airway open, making it easier to breath when you inhale again.  Bilevel positive airway pressure (BPAP) device. The BPAP device is used mainly in patients with central sleep apnea. This device is similar to the CPAP device because   it also uses an air pump to deliver continuous air pressure through a mask. However, with the BPAP machine, the  pressure is set at two different levels. The pressure when you exhale is lower than the pressure when you inhale.  Surgery. Typically, surgery is only done if you cannot comply with less invasive treatments or if the less invasive treatments do not improve your condition. Surgery involves removing excess tissue in your airway to create a wider passage way.   This information is not intended to replace advice given to you by your health care provider. Make sure you discuss any questions you have with your health care provider.   Document Released: 06/10/2002 Document Revised: 07/11/2014 Document Reviewed: 10/27/2011 Elsevier Interactive Patient Education 2016 Elsevier Inc.   

## 2015-12-18 NOTE — Telephone Encounter (Signed)
Could you please complete the application form for a CPAP machine for this patient and fax it over? He has a documented sleep study in his chart. Thank you

## 2015-12-23 ENCOUNTER — Telehealth: Payer: Self-pay

## 2015-12-23 NOTE — Telephone Encounter (Signed)
-----   Message from Jaclyn ShaggyEnobong Amao, MD sent at 12/21/2015  9:15 AM EDT ----- Please inform the patient that labs are normal. Thank you.

## 2015-12-23 NOTE — Telephone Encounter (Signed)
Writer called patient through [place] interpreter- [place]am # [phone] to give patient his lab results.  Patient was very grateful and stated understanding.

## 2015-12-24 NOTE — Progress Notes (Signed)
CPAP Assistance Program application completed by Dr. Venetia NightAmao and faxed to [employer] (Fax #[phone]).

## 2015-12-30 MED FILL — ?PANTOPRAZOLE SOD DR 40MG: 40 MG | 30 days supply | Qty: 30 | Fill #1

## 2016-01-11 ENCOUNTER — Ambulatory Visit: Payer: Self-pay | Attending: Family Medicine | Admitting: Family Medicine

## 2016-01-11 ENCOUNTER — Encounter: Payer: Self-pay | Admitting: Family Medicine

## 2016-01-11 VITALS — BP 142/94 | HR 94 | Temp 98.2°F | Ht 72.0 in | Wt 249.0 lb

## 2016-01-11 DIAGNOSIS — R739 Hyperglycemia, unspecified: Secondary | ICD-10-CM

## 2016-01-11 DIAGNOSIS — I10 Essential (primary) hypertension: Secondary | ICD-10-CM

## 2016-01-11 DIAGNOSIS — R1013 Epigastric pain: Secondary | ICD-10-CM

## 2016-01-11 DIAGNOSIS — M62838 Other muscle spasm: Secondary | ICD-10-CM

## 2016-01-11 DIAGNOSIS — R7309 Other abnormal glucose: Secondary | ICD-10-CM

## 2016-01-11 DIAGNOSIS — R350 Frequency of micturition: Secondary | ICD-10-CM

## 2016-01-11 LAB — POCT GLYCOSYLATED HEMOGLOBIN (HGB A1C): Hemoglobin A1C: 5.9

## 2016-01-11 MED ORDER — TIZANIDINE HCL 4 MG PO TABS
4.0000 mg | ORAL_TABLET | Freq: Three times a day (TID) | ORAL | Status: DC | PRN
Start: 2016-01-11 — End: 2017-05-12

## 2016-01-11 MED FILL — tiZANidine HCL 4 MG TABS: 4 | 20 days supply | Qty: 60 | Fill #0

## 2016-01-11 NOTE — Progress Notes (Signed)
Subjective:  Patient ID: Brandon Clark, male    DOB: 05-23-1941  Age: 75 y.o. MRN: [id number]  CC: Follow-up   HPI Bronte Ledell Nosslshabasy is a 75 year old male with a history of hypertension, GERD who comes into the clinic for follow-up of his blood pressure. He had been concerned that his blood pressures outside the clinic have been elevated even though it was controlled at his last office visit. His blood pressure today is controlled and he has been compliant with medications.  He complains of urinary frequency and nocturia but denies straining or hematuria. He has no known history of diabetes.  Also has occasional muscle spasms which are generalized and like to have medications for this. He informs me that she sees [facility] where he receives receives shots for back pain.  Past Medical History  Diagnosis Date  . Acid reflux   . Ulcer   . Sleep apnea   . Hypertension   . Hyperlipidemia     Past Surgical History  Procedure Laterality Date  . Facial lacerations repair       Outpatient Prescriptions Prior to Visit  Medication Sig Dispense Refill  . aspirin EC 81 MG EC tablet Take 1 tablet (81 mg total) by mouth daily. 30 tablet 0  . benzonatate (TESSALON) 200 MG capsule Take 1 capsule (200 mg total) by mouth 2 (two) times daily as needed for cough. 30 capsule 1  . guaiFENesin (MUCINEX) 600 MG 12 hr tablet Take 1 tablet (600 mg total) by mouth 2 (two) times daily as needed for cough or to loosen phlegm. 30 tablet 1  . hydrochlorothiazide (HYDRODIURIL) 12.5 MG tablet Take 1 tablet (12.5 mg total) by mouth daily. 30 tablet 4  . meclizine (ANTIVERT) 25 MG tablet Take 1 tablet (25 mg total) by mouth 2 (two) times daily as needed. 60 tablet 2  . pantoprazole (PROTONIX) 40 MG tablet Take 1 tablet (40 mg total) by mouth daily. 30 tablet 3  . simvastatin (ZOCOR) 10 MG tablet Take 1 tablet (10 mg total) by mouth at bedtime. 30 tablet 4  . albuterol (PROVENTIL  HFA;VENTOLIN HFA) 108 (90 Base) MCG/ACT inhaler Inhale 2 puffs into the lungs every 6 (six) hours as needed for wheezing or shortness of breath. 1 Inhaler 0  . Hyoscyamine Sulfate 0.375 MG TBCR One tab twice a day  as needed, abdominal pain. 60 tablet 1  . nitroGLYCERIN (NITRODUR - DOSED IN MG/24 HR) 0.2 mg/hr patch 1/4th patch over affected shoulder, change daily 30 patch 1   No facility-administered medications prior to visit.    ROS Review of Systems Constitutional: Negative for activity change and appetite change.  HENT: Negative for sinus pressure and sore throat.   Eyes: Negative for visual disturbance.  Respiratory: Negative for cough, chest tightness and shortness of breath.   Cardiovascular: Negative for chest pain and leg swelling.  Gastrointestinal:  Negative for abdominal pain,diarrhea, constipation and abdominal distention.  Endocrine: Negative.   Genitourinary: see hpi  Musculoskeletal: see hpi.  Skin: Negative for rash.  Allergic/Immunologic: Negative.   Neurological: Negative for weakness, light-headedness and numbness.  Psychiatric/Behavioral: Positive for insomnia. Negative for suicidal ideas and dysphoric mood.   Objective:  BP 142/94 mmHg  Pulse 94  Temp(Src) 98.2 F (36.8 C) (Oral)  Ht 6' (1.829 m)  Wt 249 lb (112.946 kg)  BMI 33.76 kg/m2  SpO2 97%  BP/Weight 01/11/2016 12/18/2015 09/19/2015  Systolic BP 142 126 160  Diastolic BP 94 89 107  Wt. (Lbs) 249  239.2 235  BMI 33.76 32.43 31.86      Physical Exam Constitutional: He is oriented to person, place, and time. He appears well-developed and well-nourished.  Cardiovascular: Normal rate, normal heart sounds and intact distal pulses.   No murmur heard. Pulmonary/Chest: Effort normal and breath sounds normal. He has no wheezes. He has no rales. He exhibits no tenderness.  Abdominal: Soft. Bowel sounds are normal. He exhibits mass (Ventral and umbilical hernia not tender, reducible). He exhibits no  distension. There is no tenderness.  Musculoskeletal: Normal range of motion.  Neurological: He is alert and oriented to person, place, and time.    Assessment & Plan:   1. Essential hypertension Controlled   2. Dyspepsia Controlled Continue Protonix  3. Muscle spasm Placed on Tizanidine  4. Urinary frequency I have discussed possible diagnosis of BPH and treatment options and he would like to hold off at this time Hba1c is 5.9- DM excluded  5. Elevated blood sugar - POCT glycosylated hemoglobin (Hb A1C)   Meds ordered this encounter  Medications  . tiZANidine (ZANAFLEX) 4 MG tablet    Sig: Take 1 tablet (4 mg total) by mouth every 8 (eight) hours as needed for muscle spasms.    Dispense:  60 tablet    Refill:  1    Follow-up: Return in about 3 months (around 04/12/2016) for follow up on hypertension.   Jaclyn Shaggy MD

## 2016-01-11 NOTE — Patient Instructions (Signed)
Hypertension Hypertension, commonly called high blood pressure, is when the force of blood pumping through your arteries is too strong. Your arteries are the blood vessels that carry blood from your heart throughout your body. A blood pressure reading consists of a higher number over a lower number, such as 110/72. The higher number (systolic) is the pressure inside your arteries when your heart pumps. The lower number (diastolic) is the pressure inside your arteries when your heart relaxes. Ideally you want your blood pressure below 120/80. Hypertension forces your heart to work harder to pump blood. Your arteries may become narrow or stiff. Having untreated or uncontrolled hypertension can cause heart attack, stroke, kidney disease, and other problems. RISK FACTORS Some risk factors for high blood pressure are controllable. Others are not.  Risk factors you cannot control include:   Race. You may be at higher risk if you are African American.  Age. Risk increases with age.  Gender. Men are at higher risk than women before age 45 years. After age 65, women are at higher risk than men. Risk factors you can control include:  Not getting enough exercise or physical activity.  Being overweight.  Getting too much fat, sugar, calories, or salt in your diet.  Drinking too much alcohol. SIGNS AND SYMPTOMS Hypertension does not usually cause signs or symptoms. Extremely high blood pressure (hypertensive crisis) may cause headache, anxiety, shortness of breath, and nosebleed. DIAGNOSIS To check if you have hypertension, your health care provider will measure your blood pressure while you are seated, with your arm held at the level of your heart. It should be measured at least twice using the same arm. Certain conditions can cause a difference in blood pressure between your right and left arms. A blood pressure reading that is higher than normal on one occasion does not mean that you need treatment. If  it is not clear whether you have high blood pressure, you may be asked to return on a different day to have your blood pressure checked again. Or, you may be asked to monitor your blood pressure at home for 1 or more weeks. TREATMENT Treating high blood pressure includes making lifestyle changes and possibly taking medicine. Living a healthy lifestyle can help lower high blood pressure. You may need to change some of your habits. Lifestyle changes may include:  Following the DASH diet. This diet is high in fruits, vegetables, and whole grains. It is low in salt, red meat, and added sugars.  Keep your sodium intake below 2,300 mg per day.  Getting at least 30-45 minutes of aerobic exercise at least 4 times per week.  Losing weight if necessary.  Not smoking.  Limiting alcoholic beverages.  Learning ways to reduce stress. Your health care provider may prescribe medicine if lifestyle changes are not enough to get your blood pressure under control, and if one of the following is true:  You are 18-59 years of age and your systolic blood pressure is above 140.  You are 60 years of age or older, and your systolic blood pressure is above 150.  Your diastolic blood pressure is above 90.  You have diabetes, and your systolic blood pressure is over 140 or your diastolic blood pressure is over 90.  You have kidney disease and your blood pressure is above 140/90.  You have heart disease and your blood pressure is above 140/90. Your personal target blood pressure may vary depending on your medical conditions, your age, and other factors. HOME CARE INSTRUCTIONS    Have your blood pressure rechecked as directed by your health care provider.   Take medicines only as directed by your health care provider. Follow the directions carefully. Blood pressure medicines must be taken as prescribed. The medicine does not work as well when you skip doses. Skipping doses also puts you at risk for  problems.  Do not smoke.   Monitor your blood pressure at home as directed by your health care provider. SEEK MEDICAL CARE IF:   You think you are having a reaction to medicines taken.  You have recurrent headaches or feel dizzy.  You have swelling in your ankles.  You have trouble with your vision. SEEK IMMEDIATE MEDICAL CARE IF:  You develop a severe headache or confusion.  You have unusual weakness, numbness, or feel faint.  You have severe chest or abdominal pain.  You vomit repeatedly.  You have trouble breathing. MAKE SURE YOU:   Understand these instructions.  Will watch your condition.  Will get help right away if you are not doing well or get worse.   This information is not intended to replace advice given to you by your health care provider. Make sure you discuss any questions you have with your health care provider.   Document Released: 06/20/2005 Document Revised: 11/04/2014 Document Reviewed: 04/12/2013 Elsevier Interactive Patient Education 2016 Elsevier Inc.  

## 2016-01-28 MED FILL — ?PANTOPRAZOLE SOD DR 40MG: 40 MG | 30 days supply | Qty: 30 | Fill #2

## 2016-01-28 MED FILL — ?SIMVASTATIN 10 MG TABLET: 10 | 30 days supply | Qty: 30 | Fill #1

## 2016-01-28 MED FILL — ?HYDROCHLOROTHIAZIDE 12.5MG: 12.5 | 30 days supply | Qty: 30 | Fill #1

## 2016-02-11 ENCOUNTER — Telehealth: Payer: Self-pay

## 2016-02-11 NOTE — Telephone Encounter (Signed)
This Case Manager placed call to patient to inform him that his CPAP machine was delivered to our office, and an appointment needs to be set up for him to come to clinic and receive teaching on how to use CPAP.  Phone call placed with aide of [employer] (Interpreter # [phone]) to discuss. Call placed to #[phone]; unable to reach patient. Interpreter left voicemail requesting return call. In addition, call placed to #[phone]; unable to reach patient. Interpreter left an additional voicemail requesting return call.

## 2016-02-12 ENCOUNTER — Ambulatory Visit: Payer: Self-pay | Attending: Family Medicine | Admitting: Family Medicine

## 2016-02-12 ENCOUNTER — Encounter: Payer: Self-pay | Admitting: Family Medicine

## 2016-02-12 VITALS — BP 118/77 | HR 100 | Temp 98.4°F | Ht 77.0 in | Wt 242.0 lb

## 2016-02-12 DIAGNOSIS — Z7982 Long term (current) use of aspirin: Secondary | ICD-10-CM | POA: Insufficient documentation

## 2016-02-12 DIAGNOSIS — E785 Hyperlipidemia, unspecified: Secondary | ICD-10-CM | POA: Insufficient documentation

## 2016-02-12 DIAGNOSIS — Z9889 Other specified postprocedural states: Secondary | ICD-10-CM | POA: Insufficient documentation

## 2016-02-12 DIAGNOSIS — J322 Chronic ethmoidal sinusitis: Secondary | ICD-10-CM | POA: Insufficient documentation

## 2016-02-12 DIAGNOSIS — K2970 Gastritis, unspecified, without bleeding: Secondary | ICD-10-CM | POA: Insufficient documentation

## 2016-02-12 DIAGNOSIS — K0889 Other specified disorders of teeth and supporting structures: Secondary | ICD-10-CM | POA: Insufficient documentation

## 2016-02-12 DIAGNOSIS — G4730 Sleep apnea, unspecified: Secondary | ICD-10-CM | POA: Insufficient documentation

## 2016-02-12 DIAGNOSIS — Z79899 Other long term (current) drug therapy: Secondary | ICD-10-CM | POA: Insufficient documentation

## 2016-02-12 DIAGNOSIS — K2990 Gastroduodenitis, unspecified, without bleeding: Secondary | ICD-10-CM | POA: Insufficient documentation

## 2016-02-12 DIAGNOSIS — K219 Gastro-esophageal reflux disease without esophagitis: Secondary | ICD-10-CM | POA: Insufficient documentation

## 2016-02-12 MED ORDER — PANTOPRAZOLE SODIUM 40 MG PO TBEC: 40.0000 mg | DELAYED_RELEASE_TABLET | Freq: Every day | ORAL | 3 refills | Status: DC

## 2016-02-12 MED ORDER — CETIRIZINE HCL 10 MG PO TABS: 10.0000 mg | ORAL_TABLET | Freq: Every day | ORAL | 3 refills | Status: DC

## 2016-02-12 MED ORDER — AMOXICILLIN 500 MG PO CAPS: 500.0000 mg | ORAL_CAPSULE | Freq: Three times a day (TID) | ORAL | 0 refills | Status: DC

## 2016-02-12 MED FILL — AMOXICILLIN 500 MG CAPSULE: 500 | 10 days supply | Qty: 30 | Fill #0

## 2016-02-12 MED FILL — ?CETIRIZINE HCL 10 MG TABLE: 10 | 30 days supply | Qty: 30 | Fill #0

## 2016-02-12 NOTE — Progress Notes (Signed)
Acid reflux fatigue

## 2016-02-12 NOTE — Progress Notes (Signed)
Subjective:  Patient ID: Brandon Clark, male    DOB: 02-09-1941  Age: 75 y.o. MRN: [id number]  CC: Dizziness; Dental Pain; skin tags; and swelling neck   HPI Estefano Straub presents Complaining of reflux symptoms which worsens when he bends down. He has been out of his protonix and would like to have a refill. He also complains of toothache especially when he chews but denies gum abscess or swelling of his mouth.  Complains of dizziness for the last 1 week, associated postnasal drip and early morning sputum but denies facial tenderness.  Past Medical History:  Diagnosis Date  . Acid reflux   . Hyperlipidemia   . Hypertension   . Sleep apnea   . Ulcer     Past Surgical History:  Procedure Laterality Date  . FACIAL LACERATIONS REPAIR      No Known Allergies   Outpatient Medications Prior to Visit  Medication Sig Dispense Refill  . hydrochlorothiazide (HYDRODIURIL) 12.5 MG tablet Take 1 tablet (12.5 mg total) by mouth daily. 30 tablet 4  . simvastatin (ZOCOR) 10 MG tablet Take 1 tablet (10 mg total) by mouth at bedtime. 30 tablet 4  . tiZANidine (ZANAFLEX) 4 MG tablet Take 1 tablet (4 mg total) by mouth every 8 (eight) hours as needed for muscle spasms. 60 tablet 1  . pantoprazole (PROTONIX) 40 MG tablet Take 1 tablet (40 mg total) by mouth daily. 30 tablet 3  . aspirin EC 81 MG EC tablet Take 1 tablet (81 mg total) by mouth daily. 30 tablet 0  . benzonatate (TESSALON) 200 MG capsule Take 1 capsule (200 mg total) by mouth 2 (two) times daily as needed for cough. (Patient not taking: Reported on 02/12/2016) 30 capsule 1  . guaiFENesin (MUCINEX) 600 MG 12 hr tablet Take 1 tablet (600 mg total) by mouth 2 (two) times daily as needed for cough or to loosen phlegm. (Patient not taking: Reported on 02/12/2016) 30 tablet 1  . meclizine (ANTIVERT) 25 MG tablet Take 1 tablet (25 mg total) by mouth 2 (two) times daily as needed. (Patient not taking: Reported on 02/12/2016) 60  tablet 2   No facility-administered medications prior to visit.     ROS Review of Systems  Constitutional: Negative for activity change and appetite change.  HENT: Positive for dental problem and postnasal drip. Negative for sinus pressure and sore throat.   Respiratory: Negative for chest tightness, shortness of breath and wheezing.   Cardiovascular: Negative for chest pain and palpitations.  Gastrointestinal: Negative for abdominal distention, abdominal pain, constipation and vomiting.  Genitourinary: Negative.   Musculoskeletal: Negative.   Neurological: Positive for dizziness.  Psychiatric/Behavioral: Negative for behavioral problems and dysphoric mood.    Objective:  BP 118/77 (BP Location: Right Arm, Patient Position: Sitting, Cuff Size: Large)   Pulse 100   Temp 98.4 F (36.9 C) (Oral)   Ht 6\' 5"  (1.956 m)   Wt 242 lb (109.8 kg)   SpO2 93%   BMI 28.70 kg/m   BP/Weight 02/12/2016 01/11/2016 12/18/2015  Systolic BP 118 142 126  Diastolic BP 77 94 89  Wt. (Lbs) 242 249 239.2  BMI 28.7 33.76 32.43      Physical Exam  Constitutional: He is oriented to person, place, and time. He appears well-developed and well-nourished.  HENT:  Multiple dental caries  Cardiovascular: Normal rate, normal heart sounds and intact distal pulses.   No murmur heard. Pulmonary/Chest: Effort normal and breath sounds normal. He has no wheezes. He has  no rales. He exhibits no tenderness.  Abdominal: Soft. Bowel sounds are normal. He exhibits no distension and no mass. There is no tenderness.  Musculoskeletal: Normal range of motion.  Neurological: He is alert and oriented to person, place, and time.     Assessment & Plan:   1. Gastritis and gastroduodenitis Advised to avoid late meals Sit upright for at least 2 hours after meals - pantoprazole (PROTONIX) 40 MG tablet; Take 1 tablet (40 mg total) by mouth daily.  Dispense: 30 tablet; Refill: 3  2. Chronic ethmoidal sinusitis -  cetirizine (ZYRTEC) 10 MG tablet; Take 1 tablet (10 mg total) by mouth daily.  Dispense: 30 tablet; Refill: 3  3. Tooth ache Placed on amoxicillin  4. Sleep apnea Respiratory therapist scheduled to come to the clinic for CPAP teaching next week with the patient.   Meds ordered this encounter  Medications  . pantoprazole (PROTONIX) 40 MG tablet    Sig: Take 1 tablet (40 mg total) by mouth daily.    Dispense:  30 tablet    Refill:  3  . cetirizine (ZYRTEC) 10 MG tablet    Sig: Take 1 tablet (10 mg total) by mouth daily.    Dispense:  30 tablet    Refill:  3  . amoxicillin (AMOXIL) 500 MG capsule    Sig: Take 1 capsule (500 mg total) by mouth 3 (three) times daily.    Dispense:  30 capsule    Refill:  0    Follow-up: Return in about 3 months (around 05/14/2016) for follow up of chronic medical conditions.   Jaclyn Shaggy MD

## 2016-02-12 NOTE — Progress Notes (Signed)
[employer] sent patient's CPAP machine to clinic. This Case Manager met with patient while patient in clinic to see Dr. Jarold Song. [employer] used for language translation. Patient informed that his CPAP machine has arrived to clinic. Patient informed that an appointment needs to be arranged for him to meet with a Respiratory Therapist and receive mask fitting and teaching on how to use and maintain CPAP machine. Patient verbalized understanding, and he indicated he could come to clinic on 02/16/16 at 1430 for teaching.  This Case Manager called Garfield Cornea, [place], who indicated a Respiratory Therapist could meet with patient on 02/16/16 at 1430 and provide teaching on how to use CPAP machine. No additional needs identified.

## 2016-02-16 ENCOUNTER — Telehealth: Payer: Self-pay

## 2016-02-16 NOTE — Telephone Encounter (Signed)
The patient met with Doris Cheadle, RRT today for instruction regarding CPAP use and cleaning of supplies. Deatra Canter, Radio broadcast assistant # [phone], with [employer] assisted via phone. The initial interpreter was Alma Friendly , [place] interpreter # [phone] via the video interpreter system but the connection was lost.

## 2016-02-16 NOTE — Telephone Encounter (Signed)
Call placed to the patient with the assistance of Arabic Interpreter # [phone] with [employer] Confirmed the patient's appointment for today at 1430 for CPAP training.

## 2016-02-19 MED FILL — METHOCARBAMOL 500 MG TABLET: 500 | 10 days supply | Qty: 30 | Fill #0

## 2016-02-19 MED FILL — MELOXICAM 15 MG TABLET: 15 | 30 days supply | Qty: 30 | Fill #0

## 2016-03-01 MED FILL — ?HYDROCHLOROTHIAZIDE 12.5MG: 12.5 | 30 days supply | Qty: 30 | Fill #2

## 2016-03-01 MED FILL — ?PANTOPRAZOLE SOD DR 40MG: 40 MG | 30 days supply | Qty: 30 | Fill #0

## 2016-03-01 MED FILL — tiZANidine HCL 4 MG TABS: 4 | 20 days supply | Qty: 60 | Fill #1

## 2016-03-01 MED FILL — ?SIMVASTATIN 10 MG TABLET: 10 | 30 days supply | Qty: 30 | Fill #2

## 2016-03-22 ENCOUNTER — Ambulatory Visit: Payer: Self-pay | Attending: Family Medicine | Admitting: Family Medicine

## 2016-03-22 ENCOUNTER — Encounter: Payer: Self-pay | Admitting: Family Medicine

## 2016-03-22 VITALS — BP 110/90 | HR 80 | Temp 98.2°F | Resp 20 | Wt 248.0 lb

## 2016-03-22 DIAGNOSIS — E785 Hyperlipidemia, unspecified: Secondary | ICD-10-CM

## 2016-03-22 DIAGNOSIS — K5909 Other constipation: Secondary | ICD-10-CM

## 2016-03-22 DIAGNOSIS — K2990 Gastroduodenitis, unspecified, without bleeding: Secondary | ICD-10-CM

## 2016-03-22 DIAGNOSIS — I10 Essential (primary) hypertension: Secondary | ICD-10-CM

## 2016-03-22 DIAGNOSIS — K2970 Gastritis, unspecified, without bleeding: Secondary | ICD-10-CM

## 2016-03-22 DIAGNOSIS — R399 Unspecified symptoms and signs involving the genitourinary system: Secondary | ICD-10-CM

## 2016-03-22 DIAGNOSIS — K429 Umbilical hernia without obstruction or gangrene: Secondary | ICD-10-CM

## 2016-03-22 DIAGNOSIS — K047 Periapical abscess without sinus: Secondary | ICD-10-CM

## 2016-03-22 MED ORDER — LACTULOSE 10 GM/15ML PO SOLN: 10.0000 g | Freq: Two times a day (BID) | ORAL | 1 refills | Status: DC | PRN

## 2016-03-22 MED ORDER — SIMVASTATIN 10 MG PO TABS: 10.0000 mg | ORAL_TABLET | Freq: Every day | ORAL | 4 refills | Status: DC

## 2016-03-22 MED ORDER — AMOXICILLIN 500 MG PO CAPS: 500.0000 mg | ORAL_CAPSULE | Freq: Three times a day (TID) | ORAL | 0 refills | Status: DC

## 2016-03-22 MED ORDER — PANTOPRAZOLE SODIUM 40 MG PO TBEC: 40.0000 mg | DELAYED_RELEASE_TABLET | Freq: Every day | ORAL | 3 refills | Status: DC

## 2016-03-22 MED ORDER — TAMSULOSIN HCL 0.4 MG PO CAPS: 0.4000 mg | ORAL_CAPSULE | Freq: Every day | ORAL | 3 refills | Status: DC

## 2016-03-22 MED ORDER — HYDROCHLOROTHIAZIDE 12.5 MG PO TABS: 12.5000 mg | ORAL_TABLET | Freq: Every day | ORAL | 4 refills | Status: DC

## 2016-03-22 NOTE — Progress Notes (Signed)
Patient here for hernia, c/o discomfort after eating. Was these symptoms would require surgical intervention. Having difficulty with BM, states not well form and small. Urinary urgency/incontinence. C/o gum producing pus, states he was suppose to have dental referral and no one called. Tooth pain:7.

## 2016-03-22 NOTE — Progress Notes (Signed)
Subjective:  Patient ID: Brandon Clark, male    DOB: 1941-04-09  Age: 75 y.o. MRN: [id number]  CC: Hernia and Dizziness   HPI Brandon Clark is a 75 year old male with a history of hypertension, hyperlipidemia, GERD who comes into the clinic today requesting a referral to general surgery for abdominal pain at the site of his umbilical hernia. He also complains of associated nausea; his hernia is reducible; he has noticed associated bloating, constipation and moves his bowels every other day.  Also complains of an abscess at the site of his upper left gum, toothache but denies fever. He has pain on chewing. He states he has noticed symptoms of urgency and sense of incomplete voiding. He sometimes has accidents on his way to the bathroom.  Requests refills of all his medications; continues to take his PPI for acid reflux and states it helps him with nausea. He also needs his meclizine for vertigo and tizanidine which he takes for back pain.  Past Medical History:  Diagnosis Date  . Acid reflux   . Hyperlipidemia   . Hypertension   . Sleep apnea   . Ulcer    Past Surgical History:  Procedure Laterality Date  . FACIAL LACERATIONS REPAIR      No Known Allergies    Outpatient Medications Prior to Visit  Medication Sig Dispense Refill  . aspirin EC 81 MG EC tablet Take 1 tablet (81 mg total) by mouth daily. 30 tablet 0  . hydrochlorothiazide (HYDRODIURIL) 12.5 MG tablet Take 1 tablet (12.5 mg total) by mouth daily. 30 tablet 4  . pantoprazole (PROTONIX) 40 MG tablet Take 1 tablet (40 mg total) by mouth daily. 30 tablet 3  . simvastatin (ZOCOR) 10 MG tablet Take 1 tablet (10 mg total) by mouth at bedtime. 30 tablet 4  . cetirizine (ZYRTEC) 10 MG tablet Take 1 tablet (10 mg total) by mouth daily. 30 tablet 3  . meclizine (ANTIVERT) 25 MG tablet Take 1 tablet (25 mg total) by mouth 2 (two) times daily as needed. (Patient not taking: Reported on 02/12/2016) 60 tablet 2  .  tiZANidine (ZANAFLEX) 4 MG tablet Take 1 tablet (4 mg total) by mouth every 8 (eight) hours as needed for muscle spasms. 60 tablet 1  . amoxicillin (AMOXIL) 500 MG capsule Take 1 capsule (500 mg total) by mouth 3 (three) times daily. 30 capsule 0  . benzonatate (TESSALON) 200 MG capsule Take 1 capsule (200 mg total) by mouth 2 (two) times daily as needed for cough. (Patient not taking: Reported on 02/12/2016) 30 capsule 1  . guaiFENesin (MUCINEX) 600 MG 12 hr tablet Take 1 tablet (600 mg total) by mouth 2 (two) times daily as needed for cough or to loosen phlegm. (Patient not taking: Reported on 02/12/2016) 30 tablet 1   No facility-administered medications prior to visit.     ROS Review of Systems  Constitutional: Negative for activity change and appetite change.  HENT: Negative for sinus pressure and sore throat.   Eyes: Negative for visual disturbance.  Respiratory: Negative for cough, chest tightness and shortness of breath.   Cardiovascular: Negative for chest pain and leg swelling.  Gastrointestinal: Positive for abdominal pain, constipation and nausea. Negative for abdominal distention and diarrhea.  Endocrine: Negative.   Genitourinary:       See history of present illness  Musculoskeletal: Negative for joint swelling and myalgias.  Skin: Negative for rash.  Allergic/Immunologic: Negative.   Neurological: Negative for weakness, light-headedness and numbness.  Psychiatric/Behavioral:  Negative for dysphoric mood and suicidal ideas.    Objective:  BP 110/90 (BP Location: Left Arm, Patient Position: Sitting, Cuff Size: Normal)   Pulse 80   Temp 98.2 F (36.8 C) (Oral)   Resp 20   Wt 248 lb (112.5 kg)   SpO2 97%   BMI 29.41 kg/m   BP/Weight 03/22/2016 02/12/2016 01/11/2016  Systolic BP 110 118 142  Diastolic BP 90 77 94  Wt. (Lbs) 248 242 249  BMI 29.41 28.7 33.76      Physical Exam  Constitutional: He is oriented to person, place, and time. He appears well-developed and  well-nourished.  HENT:  Multiple dental caries, abscess on left upper aspect of gum  Cardiovascular: Normal rate, normal heart sounds and intact distal pulses.   No murmur heard. Pulmonary/Chest: Effort normal and breath sounds normal. He has no wheezes. He has no rales. He exhibits no tenderness.  Abdominal: Soft. Bowel sounds are normal. He exhibits mass (ventral and umbilical herniawhich have both reducible and mildly tender to palpation). He exhibits no distension. There is no tenderness.  Musculoskeletal: Normal range of motion.  Neurological: He is alert and oriented to person, place, and time.    CMP Latest Ref Rng & Units 12/18/2015 07/22/2015 12/15/2014  Glucose 65 - 99 mg/dL 161(W) 960(A) 540(J)  BUN 7 - 25 mg/dL 20 19 16   Creatinine 0.70 - 1.18 mg/dL 8.11 9.14 7.82  Sodium 135 - 146 mmol/L 141 138 139  Potassium 3.5 - 5.3 mmol/L 3.6 4.6 4.6  Chloride 98 - 110 mmol/L 105 103 102  CO2 20 - 31 mmol/L 26 26 27   Calcium 8.6 - 10.3 mg/dL 9.0 9.4 9.7  Total Protein 6.1 - 8.1 g/dL 6.8 - -  Total Bilirubin 0.2 - 1.2 mg/dL 1.0 - -  Alkaline Phos 40 - 115 U/L 56 - -  AST 10 - 35 U/L 18 - -  ALT 9 - 46 U/L 21 - -    Lipid Panel     Component Value Date/Time   CHOL 184 12/18/2015 1622   TRIG 66 12/18/2015 1622   HDL 47 12/18/2015 1622   CHOLHDL 3.9 12/18/2015 1622   VLDL 13 12/18/2015 1622   LDLCALC 124 12/18/2015 1622    Assessment & Plan:   1. Essential hypertension controlled - hydrochlorothiazide (HYDRODIURIL) 12.5 MG tablet; Take 1 tablet (12.5 mg total) by mouth daily.  Dispense: 30 tablet; Refill: 4  2. Umbilical hernia without obstruction and without gangrene No evidence of obstruction - Ambulatory referral to General Surgery  3. Other constipation Dietary modifications doing his fiber and water intake - lactulose (CHRONULAC) 10 GM/15ML solution; Take 15 mLs (10 g total) by mouth 2 (two) times daily as needed for mild constipation.  Dispense: 946 mL; Refill:  1  4. Lower urinary tract symptoms He is refusing PSA today due to fear of cost of labs We'll treat presumptively for BPH - tamsulosin (FLOMAX) 0.4 MG CAPS capsule; Take 1 capsule (0.4 mg total) by mouth daily.  Dispense: 30 capsule; Refill: 3  5. Dental abscess - amoxicillin (AMOXIL) 500 MG capsule; Take 1 capsule (500 mg total) by mouth 3 (three) times daily.  Dispense: 30 capsule; Refill: 0 - Ambulatory referral to Dentistry  6. HLD (hyperlipidemia) Lipid panel at next visit - simvastatin (ZOCOR) 10 MG tablet; Take 1 tablet (10 mg total) by mouth at bedtime.  Dispense: 30 tablet; Refill: 4  7. Gastritis and gastroduodenitis stable - pantoprazole (PROTONIX) 40 MG tablet; Take  1 tablet (40 mg total) by mouth daily.  Dispense: 30 tablet; Refill: 3   Meds ordered this encounter  Medications  . amoxicillin (AMOXIL) 500 MG capsule    Sig: Take 1 capsule (500 mg total) by mouth 3 (three) times daily.    Dispense:  30 capsule    Refill:  0  . tamsulosin (FLOMAX) 0.4 MG CAPS capsule    Sig: Take 1 capsule (0.4 mg total) by mouth daily.    Dispense:  30 capsule    Refill:  3  . lactulose (CHRONULAC) 10 GM/15ML solution    Sig: Take 15 mLs (10 g total) by mouth 2 (two) times daily as needed for mild constipation.    Dispense:  946 mL    Refill:  1  . simvastatin (ZOCOR) 10 MG tablet    Sig: Take 1 tablet (10 mg total) by mouth at bedtime.    Dispense:  30 tablet    Refill:  4  . pantoprazole (PROTONIX) 40 MG tablet    Sig: Take 1 tablet (40 mg total) by mouth daily.    Dispense:  30 tablet    Refill:  3  . hydrochlorothiazide (HYDRODIURIL) 12.5 MG tablet    Sig: Take 1 tablet (12.5 mg total) by mouth daily.    Dispense:  30 tablet    Refill:  4    Follow-up: Return in about 3 months (around 06/21/2016) for follow up on Hyperlipidemia.   This note has been created with Education officer, environmentalDragon speech recognition software and smart phrase technology. Any transcriptional errors are  unintentional.    Jaclyn ShaggyEnobong Amao MD

## 2016-03-22 NOTE — Patient Instructions (Signed)

## 2016-03-23 MED FILL — ?TAMSULOSIN HCL 0.4 MG CAP: 0.4 | 30 days supply | Qty: 30 | Fill #0

## 2016-03-23 MED FILL — AMOXICILLIN 500 MG CAPSULE: 500 | 10 days supply | Qty: 30 | Fill #0

## 2016-03-23 MED FILL — LACTULOSE 10 GM/15 ML SOLN: 10 | 30 days supply | Qty: 946 | Fill #0

## 2016-03-24 MED FILL — ?HYDROCHLOROTHIAZIDE 12.5MG: 12.5 | 30 days supply | Qty: 30 | Fill #3

## 2016-03-24 MED FILL — ?PANTOPRAZOLE SOD DR 40MG: 40 MG | 30 days supply | Qty: 30 | Fill #1

## 2016-03-24 MED FILL — ?SIMVASTATIN 10 MG TABLET: 10 | 30 days supply | Qty: 30 | Fill #3

## 2016-03-28 ENCOUNTER — Ambulatory Visit: Payer: Self-pay | Attending: Family Medicine | Admitting: Physician Assistant

## 2016-03-28 ENCOUNTER — Telehealth: Payer: Self-pay | Admitting: Family Medicine

## 2016-03-28 VITALS — BP 153/99 | HR 85 | Temp 97.8°F | Resp 20 | Wt 247.0 lb

## 2016-03-28 DIAGNOSIS — S61219A Laceration without foreign body of unspecified finger without damage to nail, initial encounter: Secondary | ICD-10-CM

## 2016-03-28 NOTE — Telephone Encounter (Signed)
Pt received a letter in the mail stating he needed the [facility] in order to be referred out for surgery and the dentist. Pt has already been approved for the financial assistance. The documents were scanned into his chart today.

## 2016-03-28 NOTE — Progress Notes (Signed)
Chief Complaint: "I cut my hand"  Subjective: 75 yo male was opening a can in the kitchen on this past weekend and cut his hand. There was a large amount of blood so he came in to be check out. No ED visit. No evidence of infection. No fevers.    ROS:  GEN: denies fever or chills, denies change in weight Skin: denies lesions or rashes; see below EXT: no edema no erythema    Objective:  Vitals:   03/28/16 1039  BP: (!) 153/99  Pulse: 85  Resp: 20  Temp: 97.8 F (36.6 C)  TempSrc: Oral  SpO2: 97%  Weight: 247 lb (112 kg)    Physical Exam:  General: in no acute distress. Skin: see below EXT: nearly superficial cut to plantar aspect of thumb on left hand, no warmth, no drainage  Pertinent Lab Results:none   Medications: Prior to Admission medications   Medication Sig Start Date End Date Taking? Authorizing Provider  amoxicillin (AMOXIL) 500 MG capsule Take 1 capsule (500 mg total) by mouth 3 (three) times daily. 03/22/16   Jaclyn ShaggyEnobong Amao, MD  aspirin EC 81 MG EC tablet Take 1 tablet (81 mg total) by mouth daily. 07/01/14   Jeralyn BennettEzequiel Zamora, MD  cetirizine (ZYRTEC) 10 MG tablet Take 1 tablet (10 mg total) by mouth daily. 02/12/16   Jaclyn ShaggyEnobong Amao, MD  hydrochlorothiazide (HYDRODIURIL) 12.5 MG tablet Take 1 tablet (12.5 mg total) by mouth daily. 03/22/16   Jaclyn ShaggyEnobong Amao, MD  lactulose (CHRONULAC) 10 GM/15ML solution Take 15 mLs (10 g total) by mouth 2 (two) times daily as needed for mild constipation. 03/22/16   Jaclyn ShaggyEnobong Amao, MD  meclizine (ANTIVERT) 25 MG tablet Take 1 tablet (25 mg total) by mouth 2 (two) times daily as needed. Patient not taking: Reported on 02/12/2016 12/18/15   Jaclyn ShaggyEnobong Amao, MD  pantoprazole (PROTONIX) 40 MG tablet Take 1 tablet (40 mg total) by mouth daily. 03/22/16   Jaclyn ShaggyEnobong Amao, MD  simvastatin (ZOCOR) 10 MG tablet Take 1 tablet (10 mg total) by mouth at bedtime. 03/22/16   Jaclyn ShaggyEnobong Amao, MD  tamsulosin (FLOMAX) 0.4 MG CAPS capsule Take 1 capsule (0.4 mg total)  by mouth daily. 03/22/16   Jaclyn ShaggyEnobong Amao, MD  tiZANidine (ZANAFLEX) 4 MG tablet Take 1 tablet (4 mg total) by mouth every 8 (eight) hours as needed for muscle spasms. 01/11/16   Jaclyn ShaggyEnobong Amao, MD    Assessment: 1. Thumb laceration on left  Plan: Cleaned with saline  Bacitracin ointment Band-Aid, no need for steri strips No antibiotics Supplies sent home  Follow up:as scheduled with Dr. Venetia NightAmao  This note has been created with Dragon speech recognition software and smart phrase technology. Any transcriptional errors are unintentional.   Scot Juniffany Charmagne Buhl, PA-C 03/28/2016, 10:57 AM

## 2016-03-28 NOTE — Progress Notes (Signed)
Left hand laceration on Saturday with Knife.

## 2016-03-28 NOTE — Telephone Encounter (Signed)
[facility]  Urgent  03/28/16 [facility] will contact the patient to schedule an appointment Thank You

## 2016-03-30 ENCOUNTER — Ambulatory Visit (INDEPENDENT_AMBULATORY_CARE_PROVIDER_SITE_OTHER): Payer: Self-pay | Admitting: Surgery

## 2016-03-30 ENCOUNTER — Encounter: Payer: Self-pay | Admitting: Surgery

## 2016-03-30 DIAGNOSIS — M6208 Separation of muscle (nontraumatic), other site: Secondary | ICD-10-CM

## 2016-03-30 DIAGNOSIS — K219 Gastro-esophageal reflux disease without esophagitis: Secondary | ICD-10-CM

## 2016-03-30 DIAGNOSIS — K429 Umbilical hernia without obstruction or gangrene: Secondary | ICD-10-CM

## 2016-03-30 NOTE — Progress Notes (Signed)
03/30/2016  Reason for Visit:  Epigastric abdominal pain with nausea and vomiting as well as an umbilical hernia  History of Present Illness: Brandon Clark is a 75 y.o. male who presents with multiple GI symptoms.  Patient was initially referred by his PCP for evaluation of an umbilical hernia. Patient reports that his hernia bulges proximally 2 cm at times but he is able to reduce it by lying flat and pushing it. This is worse after a day of walking. This does cause pain and sometimes makes him feel bloated. He does have a diagnosis of constipation and was recently prescribed lactulose for this. However his hernia has always been reducible. He also has a history of diastases recti which was evaluated at [facility] last year.  His other main complaint today which at times he emphasized more strongly down his umbilical hernia R symptoms of nausea and vomiting associated with feeling like food is stuck in his chest after eating. He did have upper endoscopy in 2015 which revealed a 5 cm sliding hiatal hernia with free reflux into the esophagus. He has been started on Protonix by his PCP which he does report has been helping. He reports that he cannot lie flat on his back at night because he vomits or feels significant reflux which sometimes interferes with his breathing.Marland Kitchen He also has these symptoms when he is bending over down to tie his shoes.  Otherwise denies any fevers or chills any other type of chest pain or shortness of breath, any other areas of abdominal pain diarrhea blood in the stools or dysuria.  Past Medical History: Past Medical History:  Diagnosis Date  . Acid reflux   . BPH (benign prostatic hyperplasia)   . Hyperlipidemia   . Hypertension   . Sleep apnea   . Ulcer      Past Surgical History: Past Surgical History:  Procedure Laterality Date  . FACIAL LACERATIONS REPAIR      Home Medications: Prior to Admission medications   Medication Sig Start Date End Date  Taking? Authorizing Provider  amoxicillin (AMOXIL) 500 MG capsule Take 1 capsule (500 mg total) by mouth 3 (three) times daily. 03/22/16  Yes Jaclyn Shaggy, MD  hydrochlorothiazide (HYDRODIURIL) 12.5 MG tablet Take 1 tablet (12.5 mg total) by mouth daily. 03/22/16  Yes Jaclyn Shaggy, MD  lactulose (CHRONULAC) 10 GM/15ML solution Take 15 mLs (10 g total) by mouth 2 (two) times daily as needed for mild constipation. 03/22/16  Yes Jaclyn Shaggy, MD  meclizine (ANTIVERT) 25 MG tablet Take 1 tablet (25 mg total) by mouth 2 (two) times daily as needed. 12/18/15  Yes Jaclyn Shaggy, MD  pantoprazole (PROTONIX) 40 MG tablet Take 1 tablet (40 mg total) by mouth daily. 03/22/16  Yes Jaclyn Shaggy, MD  simvastatin (ZOCOR) 10 MG tablet Take 1 tablet (10 mg total) by mouth at bedtime. 03/22/16  Yes Jaclyn Shaggy, MD  tamsulosin (FLOMAX) 0.4 MG CAPS capsule Take 1 capsule (0.4 mg total) by mouth daily. 03/22/16  Yes Jaclyn Shaggy, MD  tiZANidine (ZANAFLEX) 4 MG tablet Take 1 tablet (4 mg total) by mouth every 8 (eight) hours as needed for muscle spasms. 01/11/16  Yes Jaclyn Shaggy, MD    Allergies: No Known Allergies  Social History:  reports that he has never smoked. He has never used smokeless tobacco. He reports that he does not drink alcohol or use drugs.   Family History: Family History  Problem Relation Age of Onset  . Diabetes Mother   .  Diabetes Brother   . Heart attack Neg Hx   . Hyperlipidemia Neg Hx   . Hypertension Neg Hx   . Sudden death Neg Hx     Review of Systems: Review of Systems  Constitutional: Negative for chills, fever and weight loss.  Eyes: Negative for blurred vision.  Respiratory: Negative for shortness of breath and wheezing.   Cardiovascular: Negative for chest pain and palpitations.  Gastrointestinal: Positive for abdominal pain, constipation, heartburn, nausea and vomiting. Negative for blood in stool and diarrhea.  Genitourinary: Negative for dysuria.  Musculoskeletal: Negative  for myalgias.  Skin: Negative for rash.  Neurological: Negative for dizziness and headaches.  Psychiatric/Behavioral: Negative for depression.    Physical Exam BP 127/88   Pulse (!) 107   Temp 98.4 F (36.9 C) (Oral)   Ht 6\' 5"  (1.956 m)   Wt 112.7 kg (248 lb 6.4 oz)   BMI 29.46 kg/m  CONSTITUTIONAL: No acute distress, obese HEENT:  Normocephalic, atraumatic, extraocular motion intact. NECK: Trachea is midline, and there is no jugular venous distension.  LYMPH NODES:  Lymph nodes in the neck are not enlarged. RESPIRATORY:  Lungs are clear, and breath sounds are equal bilaterally. Normal respiratory effort without pathologic use of accessory muscles. CARDIOVASCULAR: Heart is regular without murmurs, gallops, or rubs. GI: The abdomen is soft, obese but not distended. Patient does have tenderness over the umbilical area and epigastric area on deep palpation. The patient has a small 1 cm umbilical hernia and an area of approximately 8 cm of diastasis recti superior to the umbilicus. There is no evidence of incarceration or ulceration of the skin There were no palpable masses. There was no hepatosplenomegaly. MUSCULOSKELETAL:  Normal muscle strength and tone in all four extremities.  No peripheral edema or cyanosis. SKIN: Skin turgor is normal. There are no pathologic skin lesions.  NEUROLOGIC:  Motor and sensation is grossly normal.  Cranial nerves are grossly intact. PSYCH:  Alert and oriented to person, place and time. Affect is normal.  Laboratory Analysis: No results found for this or any previous visit (from the past 24 hour(s)).  Imaging: No results found.  Assessment and Plan: This is a 75 y.o. male who presents with abdominal pain with symptoms of significant acid reflux causing nausea or vomiting as well as epigastric pain and symptoms of an umbilical hernia which causes pain. At this point it appears that the reflux and associated symptoms are more significant in his  symptomatology. He has not been worked up for a hiatal hernia with the exception of the EGD done 2 years ago. At this point would start the workup including a barium swallow and manometry with pH probe. Once these studies are done he will then see one of my partners, Dr. Tonita CongWoodham, for further surgical evaluation for his hiatal hernia. Potentially his umbilical hernia could be repaired at the same time of this procedure.  I discussed with the patient different studies to be done for evaluation of his hiatal hernia and reflux symptoms as well as recommended that he continues his Protonix for acid suppression. Have also recommended the patient use pillows to lie down at night to help with his reflux symptoms. With regards to the diastases recti I also discussed with the patient that this is not a hernia as there is no defect in the abdominal wall but rather a laxity of it and thus does not need repair.  Patient understands this plan and all his questions have been answered.  Melvyn Neth, [place]

## 2016-03-30 NOTE — Patient Instructions (Signed)
Our other surgeon that does this type of surgery (Dr. Tonita CongWoodham) would like to obtain a barium swallow study and Esophageal Manometry study. Then would like to see you back in clinic. I will call you as soon as I have all of this scheduled to give you the details of this appointment.

## 2016-03-31 ENCOUNTER — Other Ambulatory Visit: Payer: Self-pay

## 2016-03-31 ENCOUNTER — Telehealth: Payer: Self-pay

## 2016-03-31 DIAGNOSIS — K449 Diaphragmatic hernia without obstruction or gangrene: Secondary | ICD-10-CM

## 2016-03-31 NOTE — Telephone Encounter (Signed)
Patient seen in office by Dr. Aleen CampiPiscoya who feels that patient is in need of Nissen Fundoplication with Dr. Tonita CongWoodham. Ordered Barium swallow, Manometry with PH, and then patient will follow-up with Dr. Tonita CongWoodham.  Tenna ChildSwallow Study scheduled for 04/12/16 at 0930. [facility] [id number] NPO after Midnight.  Manometry with PH scheduled for [facility]/20/16 at [facility]. Patient will need to stop Pantoprazole 2 days prior. Last dose will be on 04/17/16 and patient will resume after testing. He will need to be NPO after midnight as well. Will need to call [phone] between 1-3pm the day prior to find out arrival time. Information regarding Manometry will be sent to patient via mail.  Follow-up with Dr. Tonita CongWoodham scheduled for 05/02/16 to discuss results and consult for surgery.  Spoke with Zane Heraldlia (patient's daughter) at this time and she was given all above information. An interpreter has been requested at each of the above appointments.

## 2016-04-07 ENCOUNTER — Ambulatory Visit (INDEPENDENT_AMBULATORY_CARE_PROVIDER_SITE_OTHER): Payer: Self-pay | Admitting: Orthopedic Surgery

## 2016-04-07 DIAGNOSIS — M25562 Pain in left knee: Secondary | ICD-10-CM

## 2016-04-07 DIAGNOSIS — M25561 Pain in right knee: Secondary | ICD-10-CM

## 2016-04-12 ENCOUNTER — Ambulatory Visit
Admission: RE | Admit: 2016-04-12 | Discharge: 2016-04-12 | Disposition: A | Discharge status: Home / Self Care | Payer: Self-pay | Source: Ambulatory Visit | Attending: Surgery | Admitting: Surgery

## 2016-04-12 DIAGNOSIS — K449 Diaphragmatic hernia without obstruction or gangrene: Secondary | ICD-10-CM | POA: Insufficient documentation

## 2016-04-12 DIAGNOSIS — K219 Gastro-esophageal reflux disease without esophagitis: Secondary | ICD-10-CM | POA: Insufficient documentation

## 2016-04-27 ENCOUNTER — Encounter
Admission: RE | Disposition: A | Discharge status: Home / Self Care | Payer: Self-pay | Source: Ambulatory Visit | Attending: Gastroenterology

## 2016-04-27 ENCOUNTER — Encounter: Payer: Self-pay | Admitting: Gastroenterology

## 2016-04-27 ENCOUNTER — Ambulatory Visit
Admission: RE | Admit: 2016-04-27 | Discharge: 2016-04-27 | Disposition: A | Discharge status: Home / Self Care | Payer: Self-pay | Source: Ambulatory Visit | Attending: Gastroenterology | Admitting: Gastroenterology

## 2016-04-27 DIAGNOSIS — K449 Diaphragmatic hernia without obstruction or gangrene: Secondary | ICD-10-CM | POA: Insufficient documentation

## 2016-04-27 HISTORY — PX: ESOPHAGEAL MANOMETRY: SHX5429

## 2016-04-27 SURGERY — MANOMETRY, ESOPHAGUS

## 2016-04-27 MED ORDER — LIDOCAINE HCL 2 % EX GEL
1.0000 "application " | Freq: Once | CUTANEOUS | Status: AC
  Administered 2016-04-27: 2
  Filled 2016-04-27: qty 5

## 2016-04-27 MED ORDER — BUTAMBEN-TETRACAINE-BENZOCAINE 2-2-14 % EX AERO
1.0000 | INHALATION_SPRAY | Freq: Once | CUTANEOUS | Status: AC
  Administered 2016-04-27: 1 via TOPICAL
  Filled 2016-04-27: qty 20

## 2016-04-27 MED FILL — MELOXICAM 15 MG TABLET: 15 | 30 days supply | Qty: 30 | Fill #0

## 2016-04-27 MED FILL — METHOCARBAMOL 500 MG TABLET: 500 | 10 days supply | Qty: 30 | Fill #0

## 2016-04-27 SURGICAL SUPPLY — 2 items
FACESHIELD LNG OPTICON STERILE (SAFETY) IMPLANT
GLOVE BIO SURGEON STRL SZ8 (GLOVE) ×6 IMPLANT

## 2016-04-29 ENCOUNTER — Telehealth: Payer: Self-pay

## 2016-04-29 NOTE — Telephone Encounter (Signed)
[facility] that she was unable to pass manometry probe into the correct location in the esophagus therefore testing could not be completed.  Will discuss this with Dr. Tonita CongWoodham.  Patient is to follow-up on Monday 10/30 in office.

## 2016-05-02 ENCOUNTER — Ambulatory Visit (INDEPENDENT_AMBULATORY_CARE_PROVIDER_SITE_OTHER): Payer: Self-pay | Admitting: General Surgery

## 2016-05-02 ENCOUNTER — Encounter: Payer: Self-pay | Admitting: General Surgery

## 2016-05-02 ENCOUNTER — Telehealth: Payer: Self-pay

## 2016-05-02 VITALS — BP 152/99 | HR 80 | Temp 98.8°F | Ht 77.0 in | Wt 250.0 lb

## 2016-05-02 DIAGNOSIS — K429 Umbilical hernia without obstruction or gangrene: Secondary | ICD-10-CM

## 2016-05-02 DIAGNOSIS — K219 Gastro-esophageal reflux disease without esophagitis: Secondary | ICD-10-CM

## 2016-05-02 NOTE — Telephone Encounter (Signed)
[facility] in regards to Hiatal Hernia and GERD symptoms. DG Esophagus has been done. However, PH and Manometry testing was unable to be completed as Endoscopy nurse could not pass probe into esophagus after multiple attempts.  Please obtain Financial assistance information / application and send to patient. Patient does not have Insurance and is not working currently.  Please call patient with appointment and financial information please.

## 2016-05-02 NOTE — Progress Notes (Signed)
Outpatient Surgical Follow Up  05/02/2016  Brandon Clark is an [age] y.o. male.   Chief Complaint  Patient presents with  . Hiatal Hernia  . Umbilical Hernia    HPI: 75 year old male returns to clinic for follow-up for his acid reflux as well as his umbilical hernia. Since his last visit he was able to complete a barium swallow but was unable to successfully have manometry due to the GI lab's and ability to pass the catheter into the appropriate location. Patient reports that every day he has a feeling of fullness and nausea. He has this constant sour taste in the back of his mouth and a feeling of the need for up after every time he eats. If he does not take his acid suppressing medications this is worsened. He has a constant feeling of discomfort in his lower chest. He also has a feeling of "crumbling" in his abdomen especially near his umbilical hernia. He currently denies any fevers, chills, shortness of breath, vomiting, diarrhea, constipation.  Past Medical History:  Diagnosis Date  . Acid reflux   . BPH (benign prostatic hyperplasia)   . Hyperlipidemia   . Hypertension   . Sleep apnea   . Ulcer [facility](H[facility])     Past Surgical History:  Procedure Laterality Date  . ESOPHAGEAL MANOMETRY N/A 04/27/2016   Procedure: ESOPHAGEAL MANOMETRY (EM) with PH;  Surgeon: Midge Miniumarren Wohl, MD;  Location: [facility] ENDOSCOPY;  Service: Endoscopy;  Laterality: N/A;  . FACIAL LACERATIONS REPAIR      Family History  Problem Relation Age of Onset  . Diabetes Mother   . Diabetes Brother   . Heart attack Neg Hx   . Hyperlipidemia Neg Hx   . Hypertension Neg Hx   . Sudden death Neg Hx     Social History:  reports that he has never smoked. He has never used smokeless tobacco. He reports that he does not drink alcohol or use drugs.  Allergies: No Known Allergies  Medications reviewed.    ROS A multipoint review of systems was completed, all pertinent positives and negatives are documented within the  history of present illness and remainder are negative.   BP (!) 152/99   Pulse 80   Temp 98.8 F (37.1 C) (Oral)   Ht 6\' 5"  (1.956 m)   Wt 113.4 kg (250 lb)   BMI 29.65 kg/m   Physical Exam Gen.: No acute distress Neck: Supple and nontender Chest: Clear to auscultation Heart: Regular rate and rhythm Abdomen: Soft, nontender, nondistended. Easily reducible umbilical hernia and visible diastases recti. Extremities: Moves all extremities well.   No results found for this or any previous visit (from the past 48 hour(s)). No results found.  Assessment/Plan:  1. Gastroesophageal reflux disease without esophagitis 75 year old male with gastroesophageal reflux and likely sliding hiatal hernia. Had long conversation with the patient and his son about the treatment for this including antireflux surgery. Also discussed that there are times that reflux symptomatology is actually masking of esophageal dysmotility. Given that the patient was unable to successfully have manometry performed at our GI lab is my recommendation that he seek further care at a tertiary care center. We will [facility] workup and proceed with his care for his reflux. All questions were answered to the patient's satisfaction. Patient was provided with a sample of stronger anti-reflux medications to see if this can give him some relief in the interim.  2. Umbilical hernia without obstruction and without gangrene Patient  with an umbilical hernia that easily reducible. The signs and symptoms of incarceration and stranding relation were discussed in detail. Patient voiced understanding and will follow up on an as-needed basis for this. It can likely be repaired at the same time as his antireflux surgery. Patient will follow-up in clinic on an as-needed basis for the above-stated problems.  A total 25 minutes was used for this encounter with greater than 50% of it being used for counseling or  coordinating care.    Ricarda Frameharles Xariah Silvernail, MD FACS General Surgeon  05/02/2016,2:39 PM

## 2016-05-02 NOTE — Patient Instructions (Signed)
We will [facility] in regards to your Hiatal Hernia. We will call you with your appointment information as soon as it is available.  We will also try to obtain information or financial application for their financial assistance and get this to you as well.  We have given you samples of Dexilant today, if these help, please let us know so that we may send you in a prescription for this.  Call with any questions or concerns.

## 2016-05-04 NOTE — Telephone Encounter (Signed)
I have faxed a referral with demographics, imaging and clinic notes to [facility] @ [phone].  Once the referral is received and reviewed, [facility] will contact the patient within 5-7 business days with appointment information.   I will follow up and make sure this is completed within 5-7 business days and to make sure patient is mailed information for [facility].

## 2016-05-09 NOTE — Telephone Encounter (Signed)
Patient scheduled [facility] on 05/19/16.

## 2016-05-10 MED FILL — ?TAMSULOSIN HCL 0.4 MG CAP: 0.4 | 30 days supply | Qty: 30 | Fill #1

## 2016-05-10 MED FILL — ?HYDROCHLOROTHIAZIDE 12.5 M: 12.5 | 30 days supply | Qty: 30 | Fill #4

## 2016-05-10 MED FILL — ?PANTOPRAZOLE SOD DR 40MG: 40 MG | 30 days supply | Qty: 30 | Fill #2

## 2016-05-12 ENCOUNTER — Ambulatory Visit (INDEPENDENT_AMBULATORY_CARE_PROVIDER_SITE_OTHER): Payer: MEDICAID | Admitting: Orthopedic Surgery

## 2016-05-13 ENCOUNTER — Encounter (INDEPENDENT_AMBULATORY_CARE_PROVIDER_SITE_OTHER): Payer: Self-pay | Admitting: Orthopedic Surgery

## 2016-05-13 ENCOUNTER — Ambulatory Visit (INDEPENDENT_AMBULATORY_CARE_PROVIDER_SITE_OTHER): Payer: Self-pay | Admitting: Orthopedic Surgery

## 2016-05-13 DIAGNOSIS — M170 Bilateral primary osteoarthritis of knee: Secondary | ICD-10-CM

## 2016-05-13 MED ORDER — MELOXICAM 7.5 MG PO TABS: 7.5000 mg | ORAL_TABLET | Freq: Every day | ORAL | 0 refills | Status: DC

## 2016-05-13 MED ORDER — LIDOCAINE HCL 1 % IJ SOLN
5.0000 mL | INTRAMUSCULAR | Status: AC | PRN
  Administered 2016-05-13: 5 mL

## 2016-05-13 MED ORDER — METHOCARBAMOL 500 MG PO TABS: 500.0000 mg | ORAL_TABLET | Freq: Three times a day (TID) | ORAL | 0 refills | Status: DC | PRN

## 2016-05-13 MED FILL — METHOCARBAMOL 500 MG TABLET: 500 | 10 days supply | Qty: 30 | Fill #0

## 2016-05-13 NOTE — Progress Notes (Signed)
Office Visit Note   Patient: Brandon Clark           Date of Birth: Oct 12, 1940           MRN: [id number] Visit Date: 05/13/2016 Requested by: Jaclyn ShaggyEnobong Amao, MD [address] [place], [place] [id number] PCP: Jaclyn ShaggyEnobong, Amao, MD  Subjective: Chief Complaint  Patient presents with  . Right Knee - Pain, Edema  . Left Knee - Pain, Edema    HPI of the mons is a 75 year old patient with bilateral knee pain.  He has a known history of arthritis.  Translator is present today which does make the clinic appointment easier and more informative for both parties.  He describes recurrent of symptoms.  Last had aspiration injection of cortisone in early October.  We talked about long-term treatment of this problem and because of the potential restriction of kneeling he does not want to consider knee replacement.  He does report primarily pain and swelling.              Review of Systems All systems reviewed are negative as they relate to the chief complaint within the history of present illness.  Patient denies  fevers or chills.    Assessment & Plan: Visit Diagnoses:  1. Primary osteoarthritis of both knees     Plan: Impression is bilateral knee arthritis with recurrent effusion.  We aspirated both knees today.  Regarding to try to get him approved for gel injections.  If as possible with his current insurance then we can proceed if not we'll need to do episodic aspirations and injections.  Follow-Up Instructions: Return if symptoms worsen or fail to improve.   Orders:  Orders Placed This Encounter  Procedures  . Large Joint Injection/Arthrocentesis  . Large Joint Injection/Arthrocentesis   Meds ordered this encounter  Medications  . methocarbamol (ROBAXIN) 500 MG tablet    Sig: Take 1 tablet (500 mg total) by mouth every 8 (eight) hours as needed for muscle spasms.    Dispense:  30 tablet    Refill:  0  . meloxicam (MOBIC) 7.5 MG tablet    Sig: Take 1 tablet (7.5 mg total) by  mouth daily.    Dispense:  60 tablet    Refill:  0      Procedures: Large Joint Inj Date/Time: 05/13/2016 5:14 PM Performed by: Cammy CopaEAN, SCOTT Nava Song Authorized by: Cammy CopaEAN, SCOTT Omari Mcmanaway   Consent Given by:  Patient Site marked: the procedure site was marked   Timeout: prior to procedure the correct patient, procedure, and site was verified   Indications:  Pain, joint swelling and diagnostic evaluation Location:  Knee Site:  R knee Prep: patient was prepped and draped in usual sterile fashion   Needle Size:  18 G Needle Length:  1.5 inches Approach:  Superolateral Ultrasound Guidance: No   Fluoroscopic Guidance: No   Arthrogram: No   Medications:  5 mL lidocaine 1 % Aspiration Attempted: Yes   Aspirate amount (mL):  40 Aspirate:  Serous Patient tolerance:  Patient tolerated the procedure well with no immediate complications Large Joint Inj Date/Time: 05/13/2016 5:14 PM Performed by: Cammy CopaEAN, SCOTT Julienne Vogler Authorized by: Cammy CopaEAN, SCOTT Jama Mcmiller   Consent Given by:  Patient Site marked: the procedure site was marked   Timeout: prior to procedure the correct patient, procedure, and site was verified   Indications:  Pain, joint swelling and diagnostic evaluation Location:  Knee Site:  L knee Prep: patient was prepped and draped in usual sterile fashion  Needle Size:  18 G Needle Length:  1.5 inches Approach:  Superolateral Ultrasound Guidance: No   Fluoroscopic Guidance: No   Arthrogram: No   Medications:  5 mL lidocaine 1 % Aspiration Attempted: Yes   Aspirate amount (mL):  60 Aspirate:  Serous Patient tolerance:  Patient tolerated the procedure well with no immediate complications     Clinical Data: No additional findings.  Objective: Vital Signs: There were no vitals taken for this visit.  Physical Exam  Constitutional: He appears well-developed.  HENT:  Head: Normocephalic.  Eyes: EOM are normal.  Neck: Normal range of motion.  Cardiovascular: Normal rate.     Pulmonary/Chest: Effort normal.  Neurological: He is alert.  Skin: Skin is warm.  Psychiatric: He has a normal mood and affect.    Ortho Exam examination of both knees demonstrate varus alignment palpable pedal pulses 5-10 flexion contracture bilaterally mild-to-moderate effusion in both knees.  Collaterals are stable extensor mechanism is intact there is no groin pain with internal/external rotation of the leg he does have flexion to about 115 bilaterally.  No other masses lymph adenopathy or skin changes noted in the knee region  Specialty Comments:  No specialty comments available.  Imaging: No results found.   PMFS History: Patient Active Problem List   Diagnosis Date Noted  . Acid reflux 02/12/2016  . Sleep apnea 02/12/2016  . Diastasis recti 03/02/2015  . Umbilical hernia 09/16/2014  . BPV (benign positional vertigo) 07/01/2014  . HTN (hypertension) 07/01/2014  . Panic attack   . Encephalopathy, hypertensive   . Ataxia 06/30/2014  . Vertigo 06/30/2014  . Dyspepsia 04/03/2014  . Encounter for colorectal cancer screening 04/03/2014  . Bilateral shoulder pain 09/17/2013  . Bilateral knee pain 02/05/2013   Past Medical History:  Diagnosis Date  . Acid reflux   . BPH (benign prostatic hyperplasia)   . Hyperlipidemia   . Hypertension   . Sleep apnea   . Ulcer (HCC)     Family History  Problem Relation Age of Onset  . Diabetes Mother   . Diabetes Brother   . Heart attack Neg Hx   . Hyperlipidemia Neg Hx   . Hypertension Neg Hx   . Sudden death Neg Hx     Past Surgical History:  Procedure Laterality Date  . ESOPHAGEAL MANOMETRY N/A 04/27/2016   Procedure: ESOPHAGEAL MANOMETRY (EM) with PH;  Surgeon: Midge Miniumarren Wohl, MD;  Location: [facility] ENDOSCOPY;  Service: Endoscopy;  Laterality: N/A;  . FACIAL LACERATIONS REPAIR     Social History   Occupational History  . Engineer    Social History Main Topics  . Smoking status: Never Smoker  . Smokeless tobacco: Never  Used  . Alcohol use No  . Drug use: No  . Sexual activity: No

## 2016-07-06 MED FILL — SIMVASTATIN 10 MG TABLET: 10 | 30 days supply | Qty: 30 | Fill #4

## 2016-07-06 MED FILL — ?PANTOPRAZOLE SOD DR 40MG: 40 MG | 30 days supply | Qty: 30 | Fill #3

## 2016-07-06 MED FILL — ?TAMSULOSIN HCL 0.4 MG CAP: 0.4 | 30 days supply | Qty: 30 | Fill #2

## 2016-07-06 MED FILL — HYDROCHLOROTHIAZIDE 12.5 MG: 12.5 | 90 days supply | Qty: 90 | Fill #1

## 2016-07-08 ENCOUNTER — Ambulatory Visit: Payer: Self-pay | Attending: Family Medicine | Admitting: Physician Assistant

## 2016-07-08 VITALS — BP 124/79 | HR 97 | Temp 98.2°F | Resp 16 | Wt 246.2 lb

## 2016-07-08 DIAGNOSIS — I10 Essential (primary) hypertension: Secondary | ICD-10-CM

## 2016-07-08 DIAGNOSIS — M62838 Other muscle spasm: Secondary | ICD-10-CM

## 2016-07-08 DIAGNOSIS — L918 Other hypertrophic disorders of the skin: Secondary | ICD-10-CM

## 2016-07-08 DIAGNOSIS — M545 Low back pain, unspecified: Secondary | ICD-10-CM

## 2016-07-08 DIAGNOSIS — Z79899 Other long term (current) drug therapy: Secondary | ICD-10-CM | POA: Insufficient documentation

## 2016-07-08 DIAGNOSIS — J209 Acute bronchitis, unspecified: Secondary | ICD-10-CM

## 2016-07-08 DIAGNOSIS — E7800 Pure hypercholesterolemia, unspecified: Secondary | ICD-10-CM

## 2016-07-08 DIAGNOSIS — R399 Unspecified symptoms and signs involving the genitourinary system: Secondary | ICD-10-CM

## 2016-07-08 MED ORDER — METHOCARBAMOL 500 MG PO TABS: 500.0000 mg | ORAL_TABLET | Freq: Three times a day (TID) | ORAL | 0 refills | Status: DC | PRN

## 2016-07-08 MED ORDER — DEXLANSOPRAZOLE 60 MG PO CPDR: 60.0000 mg | DELAYED_RELEASE_CAPSULE | Freq: Every day | ORAL | 5 refills | Status: DC

## 2016-07-08 MED ORDER — AZITHROMYCIN 250 MG PO TABS: ORAL_TABLET | ORAL | 0 refills | Status: DC

## 2016-07-08 MED ORDER — HYDROCHLOROTHIAZIDE 12.5 MG PO TABS: 12.5000 mg | ORAL_TABLET | Freq: Every day | ORAL | 4 refills | Status: DC

## 2016-07-08 MED ORDER — BENZONATATE 200 MG PO CAPS: 200.0000 mg | ORAL_CAPSULE | Freq: Two times a day (BID) | ORAL | 0 refills | Status: DC | PRN

## 2016-07-08 MED ORDER — TAMSULOSIN HCL 0.4 MG PO CAPS: 0.4000 mg | ORAL_CAPSULE | Freq: Every day | ORAL | 3 refills | Status: DC

## 2016-07-08 MED ORDER — TRAMADOL HCL 50 MG PO TABS: 50.0000 mg | ORAL_TABLET | Freq: Three times a day (TID) | ORAL | 0 refills | Status: DC | PRN

## 2016-07-08 MED ORDER — SIMVASTATIN 10 MG PO TABS: 10.0000 mg | ORAL_TABLET | Freq: Every day | ORAL | 4 refills | Status: DC

## 2016-07-08 NOTE — Progress Notes (Signed)
Pt states he has had a cold for 2 weeks Pt states he has skin tags all over his neck  Pt states he received some medicine for his stomach and it is not helping

## 2016-07-08 NOTE — Progress Notes (Signed)
Patient ID: Brandon Clark, male   DOB: [birth year], 76 y.o.   MRN: [id number]   Brandon Clark, is a 76 y.o. male  WUJ:[id number]  [id number]  DOB - [birth year]  Subjective:  Chief Complaint and HPI: Brandon Clark is a 76 y.o. male here today with multiple complaints.  1-He wants to get dexilant filled.  Says pantoprazole doesn't work. 2-wants to get skin tags removed on neck, trunk, and abdomen.  3-Cough X 2 weeks that isn't getting much better.  4-LBP that he thinks is from coughing so hard.  [employer] interpreters "Hind" used.  ROS:   Constitutional:  No f/c, No night sweats, No unexplained weight loss. EENT:  No vision changes, No blurry vision, No hearing changes. No mouth, throat, or ear problems.  Respiratory: +cough, No SOB Cardiac: No CP, no palpitations GI:  No abd pain, No N/V/D. + reflux GU: No Urinary s/sx Musculoskeletal: +LBP Neuro: No headache, no dizziness, no motor weakness.  Skin: No rash Endocrine:  No polydipsia. No polyuria.  Psych: Denies SI/HI  No problems updated.  ALLERGIES: No Known Allergies  PAST MEDICAL HISTORY: Past Medical History:  Diagnosis Date  . Acid reflux   . BPH (benign prostatic hyperplasia)   . Hyperlipidemia   . Hypertension   . Sleep apnea   . Ulcer (HCC)     MEDICATIONS AT HOME: Prior to Admission medications   Medication Sig Start Date End Date Taking? Authorizing Provider  dexlansoprazole (DEXILANT) 60 MG capsule Take 1 capsule (60 mg total) by mouth daily. 07/08/16  Yes Anders Simmonds, PA-C  azithromycin (ZITHROMAX) 250 MG tablet Take 2 today then 1 each day thereafter 07/08/16   Anders Simmonds, PA-C  benzonatate (TESSALON) 200 MG capsule Take 1 capsule (200 mg total) by mouth 2 (two) times daily as needed for cough. 07/08/16   Anders Simmonds, PA-C  hydrochlorothiazide (HYDRODIURIL) 12.5 MG tablet Take 1 tablet (12.5 mg total) by mouth daily. 03/22/16   Jaclyn Shaggy, MD  lactulose (CHRONULAC) 10 GM/15ML  solution Take 15 mLs (10 g total) by mouth 2 (two) times daily as needed for mild constipation. 03/22/16   Jaclyn Shaggy, MD  meclizine (ANTIVERT) 25 MG tablet Take 1 tablet (25 mg total) by mouth 2 (two) times daily as needed. 12/18/15   Jaclyn Shaggy, MD  meloxicam (MOBIC) 7.5 MG tablet Take 1 tablet (7.5 mg total) by mouth daily. Patient not taking: Reported on 07/08/2016 05/13/16   Cammy Copa, MD  methocarbamol (ROBAXIN) 500 MG tablet Take 1 tablet (500 mg total) by mouth every 8 (eight) hours as needed for muscle spasms. 07/08/16   Anders Simmonds, PA-C  simvastatin (ZOCOR) 10 MG tablet Take 1 tablet (10 mg total) by mouth at bedtime. 03/22/16   Jaclyn Shaggy, MD  tamsulosin (FLOMAX) 0.4 MG CAPS capsule Take 1 capsule (0.4 mg total) by mouth daily. 03/22/16   Jaclyn Shaggy, MD  tiZANidine (ZANAFLEX) 4 MG tablet Take 1 tablet (4 mg total) by mouth every 8 (eight) hours as needed for muscle spasms. 01/11/16   Jaclyn Shaggy, MD  traMADol (ULTRAM) 50 MG tablet Take 1 tablet (50 mg total) by mouth every 8 (eight) hours as needed. 07/08/16   Anders Simmonds, PA-C     Objective:  EXAM:   Vitals:   07/08/16 1459  BP: 124/79  Pulse: 97  Resp: 16  Temp: 98.2 F (36.8 C)  TempSrc: Oral  SpO2: 95%  Weight: 246 lb 3.2 oz (111.7 kg)  General appearance : A&OX3. NAD. Non-toxic-appearing HEENT: Atraumatic and Normocephalic.  PERRLA. EOM intact.  TM clear B. Mouth-MMM, post pharynx WNL w/o erythema, No PND. Neck: supple, no JVD. No cervical lymphadenopathy. No thyromegaly Chest/Lungs:  Breathing-non-labored, Good air entry bilaterally, breath sounds normal without rales, rhonchi, or wheezing  CVS: S1 S2 regular, no murmurs, gallops, rubs  Abdomen: Bowel sounds present, Non tender and not distended with no gaurding, rigidity or rebound. Back:  paraspinus muscle spasm B.  ROM WNL for age.  No spinal TTP.  Neg SLR B. Extremities: Bilateral Lower Ext shows no edema, both legs are warm to touch with =  pulse throughout Neurology:  CN II-XII grossly intact, Non focal.   Psych:  TP linear. J/I WNL. Normal speech. Appropriate eye contact and affect.  Skin:  Multiple skin tags along base of neck, axilla, chest,abdomen  Data Review Lab Results  Component Value Date   HGBA1C 5.9 01/11/2016   HGBA1C 6.3 (H) 06/30/2014   HGBA1C 6.3 (H) 06/30/2014     Assessment & Plan   1. Muscle spasm of lower back Likely muscle strain secondary to harsh cough - methocarbamol (ROBAXIN) 500 MG tablet; Take 1 tablet (500 mg total) by mouth every 8 (eight) hours as needed for muscle spasms.  Dispense: 30 tablet; Refill: 0  2. Acute bronchitis, unspecified organism Will cover for atypicals due to length of illness - azithromycin (ZITHROMAX) 250 MG tablet; Take 2 today then 1 each day thereafter  Dispense: 6 tablet; Refill: 0 - benzonatate (TESSALON) 200 MG capsule; Take 1 capsule (200 mg total) by mouth 2 (two) times daily as needed for cough.  Dispense: 30 capsule; Refill: 0  3. Cutaneous skin tags - Ambulatory referral to Dermatology  4. Acute low back pain without sciatica, unspecified back pain laterality - traMADol (ULTRAM) 50 MG tablet; Take 1 tablet (50 mg total) by mouth every 8 (eight) hours as needed.  Dispense: 30 tablet; Refill: 0  D/C pantoprazole and rx dexilant per patient request.  Patient have been counseled extensively about nutrition and exercise  Return in about 4 weeks (around 08/05/2016) for Dr Venetia NightAmao; f/up htn/hand tingling.  The patient was given clear instructions to go to ER or return to medical center if symptoms don't improve, worsen or new problems develop. The patient verbalized understanding. The patient was told to call to get lab results if they haven't heard anything in the next week.     Georgian CoAngela Kiante Ciavarella, PA-C [facility] [place], [place] [phone]   07/08/2016, 3:37 PM

## 2016-08-04 MED FILL — AZITHROMYCIN 250 MG TABLET: 250 | 5 days supply | Qty: 6 | Fill #0

## 2016-08-04 MED FILL — METHOCARBAMOL 500 MG TABLET: 500 | 10 days supply | Qty: 30 | Fill #0

## 2016-08-04 MED FILL — BENZONATATE 100 MG CAPSULE: 100 | 15 days supply | Qty: 60 | Fill #0

## 2016-08-04 MED FILL — traMADol HCL 50 MG TABS: 50 | 10 days supply | Qty: 30 | Fill #0

## 2016-08-04 MED FILL — ?PANTOPRAZOLE SOD DR 40MG: 40 MG | 30 days supply | Qty: 30 | Fill #0

## 2016-08-04 MED FILL — DEXILANT DR 60 MG CAPSULE: 60 | 30 days supply | Qty: 30 | Fill #0

## 2016-08-04 MED FILL — MELOXICAM 15 MG TABLET: 15 | 30 days supply | Qty: 30 | Fill #0

## 2016-09-16 MED FILL — ?TAMSULOSIN HCL 0.4 MG CAP: 0.4 | 30 days supply | Qty: 30 | Fill #0

## 2016-10-19 MED FILL — TAMSULOSIN HCL 0.4 MG CAP: 0.4 | 30 days supply | Qty: 30 | Fill #1

## 2016-10-26 ENCOUNTER — Ambulatory Visit (INDEPENDENT_AMBULATORY_CARE_PROVIDER_SITE_OTHER): Payer: Self-pay | Admitting: Orthopedic Surgery

## 2016-10-26 ENCOUNTER — Ambulatory Visit: Payer: Self-pay | Attending: Family Medicine

## 2016-10-26 ENCOUNTER — Ambulatory Visit (INDEPENDENT_AMBULATORY_CARE_PROVIDER_SITE_OTHER): Payer: Self-pay

## 2016-10-26 ENCOUNTER — Encounter (INDEPENDENT_AMBULATORY_CARE_PROVIDER_SITE_OTHER): Payer: Self-pay | Admitting: Orthopedic Surgery

## 2016-10-26 DIAGNOSIS — M25562 Pain in left knee: Secondary | ICD-10-CM

## 2016-10-26 DIAGNOSIS — M25561 Pain in right knee: Secondary | ICD-10-CM

## 2016-10-26 DIAGNOSIS — M170 Bilateral primary osteoarthritis of knee: Secondary | ICD-10-CM

## 2016-10-26 MED ORDER — IBUPROFEN 800 MG PO TABS: 800.0000 mg | ORAL_TABLET | Freq: Three times a day (TID) | ORAL | 2 refills | Status: DC | PRN

## 2016-10-26 MED ORDER — BUPIVACAINE HCL 0.25 % IJ SOLN
4.0000 mL | INTRAMUSCULAR | Status: AC | PRN
  Administered 2016-10-26: 4 mL via INTRA_ARTICULAR

## 2016-10-26 MED ORDER — METHYLPREDNISOLONE ACETATE 40 MG/ML IJ SUSP
40.0000 mg | INTRAMUSCULAR | Status: AC | PRN
  Administered 2016-10-26: 40 mg via INTRA_ARTICULAR

## 2016-10-26 MED ORDER — LIDOCAINE HCL 1 % IJ SOLN
5.0000 mL | INTRAMUSCULAR | Status: AC | PRN
  Administered 2016-10-26: 5 mL

## 2016-10-26 MED FILL — IBUPROFEN 800 MG TABLET: 800 | 20 days supply | Qty: 60 | Fill #0

## 2016-10-26 MED FILL — DEXILANT DR 60 MG CAPSULE: 60 | 30 days supply | Qty: 30 | Fill #1

## 2016-10-26 NOTE — Progress Notes (Signed)
Office Visit Note   Patient: Brandon Clark           Date of Birth: May 30, 1941           MRN: [id number] Visit Date: 10/26/2016 Requested by: Jaclyn Shaggy, MD [address] [place], [place] [address] PCP: Jaclyn Shaggy, MD  Subjective: Chief Complaint  Patient presents with  . Right Knee - Follow-up  . Left Knee - Follow-up    HPI: Patient is 76 year old with known history of recurrent bilateral knee effusions.  Had cortisone injection 5 months ago and did very well with that.  States he's having some pain which radiates down to his ankles.  Denies any back pain.  He seen a cardiologist to get him a clean bill of health.  He's been trying to do some exercises.  Reports increasing pain when he bends his knees particularly when he goes up and down stairs.  He is here with a translator today which makes the clinic visit more time consuming with [place] more efficient.  His pain is worse with standing.             ROS: All systems reviewed are negative as they relate to the chief complaint within the history of present illness.  Patient denies  fevers or chills.   Assessment & Plan: Visit Diagnoses:  1. Pain in both knees, unspecified chronicity     Plan: Impression is bilateral knee effusion with fairly minimal degenerative changes in the right knee on plain radiographs.  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  No real mechanical symptoms and he would not even consider arthroscopic intervention for his knees.  He really just wants to keep getting injections.  He is also reporting some ankle pain.  I examined his ankles and he does have flat feet and potential early posterior tib tendon insufficiency.  I think that's something that I would go with over-the-counter inserts and shoes with good arch supports.  In regards to the knees we aspirated and injected both knees today.  See how he does with that and see him back as needed.  Follow-Up Instructions: No Follow-up  on file.   Orders:  Orders Placed This Encounter  Procedures  . XR KNEE 3 VIEW RIGHT  . XR KNEE 3 VIEW LEFT   No orders of the defined types were placed in this encounter.     Procedures: Large Joint Inj Date/Time: 10/26/2016 10:05 AM Performed by: Cammy Copa Authorized by: Cammy Copa   Consent Given by:  Patient Site marked: the procedure site was marked   Timeout: prior to procedure the correct patient, procedure, and site was verified   Indications:  Pain, joint swelling and diagnostic evaluation Location:  Knee Site:  R knee Prep: patient was prepped and draped in usual sterile fashion   Needle Size:  18 G Needle Length:  1.5 inches Approach:  Superolateral Ultrasound Guidance: No   Fluoroscopic Guidance: No   Arthrogram: No   Medications:  5 mL lidocaine 1 %; 4 mL bupivacaine 0.25 %; 40 mg methylPREDNISolone acetate 40 MG/ML Aspiration Attempted: Yes   Aspirate amount (mL):  60 Aspirate:  Yellow Patient tolerance:  Patient tolerated the procedure well with no immediate complications Large Joint Inj Date/Time: 10/26/2016 10:06 AM Performed by: Cammy Copa Authorized by: Cammy Copa   Consent Given by:  Patient Site marked: the procedure site was marked   Timeout: prior to procedure the correct patient, procedure, and site  was verified   Indications:  Pain, joint swelling and diagnostic evaluation Location:  Knee Site:  L knee Prep: patient was prepped and draped in usual sterile fashion   Needle Size:  18 G Needle Length:  1.5 inches Approach:  Superolateral Ultrasound Guidance: No   Fluoroscopic Guidance: No   Arthrogram: No   Medications:  4 mL bupivacaine 0.25 %; 5 mL lidocaine 1 %; 40 mg methylPREDNISolone acetate 40 MG/ML Aspiration Attempted: Yes   Aspirate amount (mL):  70 Aspirate:  Yellow Patient tolerance:  Patient tolerated the procedure well with no immediate complications     Clinical Data: No additional  findings.  Objective: Vital Signs: There were no vitals taken for this visit.  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  He's got palpable intact nontender anterior to posterior tib peroneal Achilles tendon.  Does have a little bit of weakness to inversion strength with posterior tib function bilaterally.  No real effusion synovitis in either ankle joint.  Both knees have moderate effusion.  Collateral cruciate ligaments are stable.  There is no focal joint line tenderness.  Extensor mechanism is intact and there is no groin pain with internal/external rotation of either leg.  Specialty Comments:  No specialty comments available.  Imaging: Xr Knee 3 View Left  Result Date: 10/26/2016 AP lateral oblique of the left knee reviewed.  Patient has maintenance of joint space.  Some calcific fecaliths present posteriorly.  Patellofemoral joint has arthritis present.  Patella is well centered in the trochlear groove but there is some patellofemoral wear and tear on the lateral facet.  This is more pronounced on the left side than the right.  On the coronal view alignment is normal and there is mild spurring medially but joint space is maintained.  Xr Knee 3 View Right  Result Date: 10/26/2016 AP lateral merchant right knee reviewed.  Joint spaces maintained on the coronal view.  No spurring is present.  Lateral view demonstrates mild patellofemoral arthritis.  Calcific phlebolith present posteriorly.  Patellofemoral joint is maintained with symmetric joint space which is better  than the left knee.    PMFS History: Patient Active Problem List   Diagnosis Date Noted  . Acid reflux 02/12/2016  . Sleep apnea 02/12/2016  . Diastasis recti 03/02/2015  . Umbilical hernia 09/16/2014  . BPV (benign positional vertigo) 07/01/2014  . HTN (hypertension) 07/01/2014  . Panic attack   . Encephalopathy, hypertensive   . Ataxia 06/30/2014  . Vertigo 06/30/2014  . Dyspepsia 04/03/2014  . Encounter for colorectal cancer screening 04/03/2014  . Bilateral shoulder pain 09/17/2013  . Bilateral knee pain 02/05/2013   Past Medical History:  Diagnosis Date  . Acid reflux   . BPH (benign prostatic hyperplasia)   . Hyperlipidemia   . Hypertension   . Sleep apnea   . Ulcer     Family History  Problem Relation Age of Onset  . Diabetes Mother   . Diabetes Brother   . Heart attack Neg Hx   . Hyperlipidemia Neg Hx   . Hypertension Neg Hx   . Sudden death Neg Hx  Past Surgical History:  Procedure Laterality Date  . ESOPHAGEAL MANOMETRY N/A 04/27/2016   Procedure: ESOPHAGEAL MANOMETRY (EM) with PH;  Surgeon: Midge Minium, MD;  Location: [facility] ENDOSCOPY;  Service: Endoscopy;  Laterality: N/A;  . FACIAL LACERATIONS REPAIR     Social History   Occupational History  . Engineer    Social History Main Topics  . Smoking status: Never Smoker  . Smokeless tobacco: Never Used  . Alcohol use No  . Drug use: No  . Sexual activity: No

## 2016-11-30 MED FILL — ?TAMSULOSIN HCL 0.4 MG CAP: 0.4 | 30 days supply | Qty: 30 | Fill #2

## 2016-11-30 MED FILL — HYDROCHLOROTHIAZIDE 12.5 MG: 12.5 | 90 days supply | Qty: 90 | Fill #2

## 2016-11-30 MED FILL — DEXILANT DR 60 MG CAPSULE: 60 | 30 days supply | Qty: 30 | Fill #2

## 2016-12-29 ENCOUNTER — Other Ambulatory Visit: Payer: Self-pay | Admitting: Physician Assistant

## 2016-12-29 DIAGNOSIS — R399 Unspecified symptoms and signs involving the genitourinary system: Secondary | ICD-10-CM

## 2016-12-29 MED FILL — ?TAMSULOSIN HCL 0.4 MG CAP: 0.4 | 30 days supply | Qty: 30 | Fill #3

## 2016-12-29 MED FILL — DEXILANT DR 60 MG CAPSULE: 60 | 30 days supply | Qty: 30 | Fill #3

## 2016-12-29 MED FILL — IBUPROFEN 800 MG TABLET: 800 | 20 days supply | Qty: 60 | Fill #1

## 2017-01-02 ENCOUNTER — Encounter: Payer: Self-pay | Admitting: Pharmacist

## 2017-01-02 NOTE — Progress Notes (Signed)
[place] [facility] [place] [facility] [place] [facility] [place] Dexilant. Prior auth completed and approved.  #[id number]

## 2017-01-09 ENCOUNTER — Ambulatory Visit (INDEPENDENT_AMBULATORY_CARE_PROVIDER_SITE_OTHER): Payer: Medicaid Other | Admitting: Orthopedic Surgery

## 2017-01-09 DIAGNOSIS — M25561 Pain in right knee: Secondary | ICD-10-CM | POA: Diagnosis not present

## 2017-01-09 DIAGNOSIS — M25562 Pain in left knee: Secondary | ICD-10-CM | POA: Diagnosis not present

## 2017-01-09 DIAGNOSIS — G8929 Other chronic pain: Secondary | ICD-10-CM | POA: Diagnosis not present

## 2017-01-09 MED ORDER — MELOXICAM 7.5 MG PO TABS: 7.5000 mg | ORAL_TABLET | Freq: Every day | ORAL | 0 refills | Status: DC

## 2017-01-10 MED FILL — MELOXICAM 7.5 MG TABLET: 7.5 | 30 days supply | Qty: 30 | Fill #0

## 2017-01-11 ENCOUNTER — Encounter (INDEPENDENT_AMBULATORY_CARE_PROVIDER_SITE_OTHER): Payer: Self-pay | Admitting: Orthopedic Surgery

## 2017-01-11 DIAGNOSIS — M25562 Pain in left knee: Secondary | ICD-10-CM

## 2017-01-11 DIAGNOSIS — G8929 Other chronic pain: Secondary | ICD-10-CM

## 2017-01-11 DIAGNOSIS — M25561 Pain in right knee: Secondary | ICD-10-CM | POA: Diagnosis not present

## 2017-01-11 MED ORDER — LIDOCAINE HCL 1 % IJ SOLN
5.0000 mL | INTRAMUSCULAR | Status: AC | PRN
  Administered 2017-01-11: 5 mL

## 2017-01-11 NOTE — Progress Notes (Signed)
Office Visit Note   Patient: Brandon Clark           Date of Birth: 12-16-1940           MRN: [id number] Visit Date: 01/09/2017 Requested by: [name], En[place]ong, MD [address] [place], [place] [id number] PCP: [name], Enobong, MD  Subjective: Chief Complaint  Patient presents with  . Right Knee - Pain  . Left Knee - Pain    HPI: Patient is 76 year old male with bilateral knee pain and arthritis.  His had recurrent effusions in the knees.  Does not want to pursue surgical intervention.  States that the pain is constant.  He is waking with pain.  States that his legs feel unstable.  He had bilateral cortisone injections into the knees in August which helped.  He rates his pain currently at level VI out of 10.  He states he can walk 15 minutes but states that he cannot walk 200 feet and thus is requesting a handicap sticker.  He is taking medication for the problem.              ROS: All systems reviewed are negative as they relate to the chief complaint within the history of present illness.  Patient denies  fevers or chills.   Assessment & Plan: Visit Diagnoses:  1. Chronic pain of both knees     Plan: Impression is bilateral knee arthritis with recurrent effusions.  I will refill his anti-inflammatories and muscle relaxers.  We want to avoid narcotic medication.  Both knees are aspirated today.  Translator is present.  Does not want to do knee replacement.  I'll see him back as needed.  Follow-Up Instructions: No Follow-up on file.   Orders:  No orders of the defined types were placed in this encounter.  Meds ordered this encounter  Medications  . meloxicam (MOBIC) 7.5 MG tablet    Sig: Take 1 tablet (7.5 mg total) by mouth daily.    Dispense:  60 tablet    Refill:  0      Procedures: Large Joint Inj Date/Time: 01/11/2017 8:36 AM Performed by: Cammy CopaEAN, SCOTT Samanthajo Payano Authorized by: Cammy CopaEAN, SCOTT Aarthi Uyeno   Consent Given by:  Patient Site marked: the procedure site was  marked   Timeout: prior to procedure the correct patient, procedure, and site was verified   Indications:  Pain, joint swelling and diagnostic evaluation Location:  Knee Site:  R knee Prep: patient was prepped and draped in usual sterile fashion   Needle Size:  18 G Needle Length:  1.5 inches Approach:  Superolateral Ultrasound Guidance: No   Fluoroscopic Guidance: No   Arthrogram: No   Medications:  5 mL lidocaine 1 % Aspiration Attempted: Yes   Aspirate amount (mL):  20 Aspirate:  Yellow Patient tolerance:  Patient tolerated the procedure well with no immediate complications Large Joint Inj Date/Time: 01/11/2017 8:37 AM Performed by: Cammy CopaEAN, SCOTT Alic Hilburn Authorized by: Cammy CopaEAN, SCOTT Somaly Marteney   Consent Given by:  Patient Site marked: the procedure site was marked   Timeout: prior to procedure the correct patient, procedure, and site was verified   Indications:  Pain, joint swelling and diagnostic evaluation Location:  Knee Site:  L knee Prep: patient was prepped and draped in usual sterile fashion   Needle Size:  18 G Needle Length:  1.5 inches Approach:  Superolateral Ultrasound Guidance: No   Fluoroscopic Guidance: No   Arthrogram: No   Medications:  5 mL lidocaine 1 % Aspiration Attempted: Yes  Aspirate amount (mL):  15 Aspirate:  Yellow Patient tolerance:  Patient tolerated the procedure well with no immediate complications     Clinical Data: No additional findings.  Objective: Vital Signs: There were no vitals taken for this visit.  Physical Exam:   Constitutional: Patient appears well-developed HEENT:  Head: Normocephalic Eyes:EOM are normal Neck: Normal range of motion Cardiovascular: Normal rate Pulmonary/chest: Effort normal Neurologic: Patient is alert Skin: Skin is warm Psychiatric: Patient has normal mood and affect    Ortho Exam: Orthopedic exam demonstrates full active and passive motion of the knees.  Mild effusion is present.  Medial joint  line tenderness present.  Collateral and cruciate ligaments are stable.  Extensor mechanism is intact.  Pedal pulses palpable.  No groin pain with internal/external rotation of the legs.  Specialty Comments:  No specialty comments available.  Imaging: No results found.   PMFS History: Patient Active Problem List   Diagnosis Date Noted  . Acid reflux 02/12/2016  . Sleep apnea 02/12/2016  . Diastasis recti 03/02/2015  . Umbilical hernia 09/16/2014  . BPV (benign positional vertigo) 07/01/2014  . HTN (hypertension) 07/01/2014  . Panic attack   . Encephalopathy, hypertensive   . Ataxia 06/30/2014  . Vertigo 06/30/2014  . Dyspepsia 04/03/2014  . Encounter for colorectal cancer screening 04/03/2014  . Bilateral shoulder pain 09/17/2013  . Bilateral knee pain 02/05/2013   Past Medical History:  Diagnosis Date  . Acid reflux   . BPH (benign prostatic hyperplasia)   . Hyperlipidemia   . Hypertension   . Sleep apnea   . Ulcer     Family History  Problem Relation Age of Onset  . Diabetes Mother   . Diabetes Brother   . Heart attack Neg Hx   . Hyperlipidemia Neg Hx   . Hypertension Neg Hx   . Sudden death Neg Hx     Past Surgical History:  Procedure Laterality Date  . ESOPHAGEAL MANOMETRY N/A 04/27/2016   Procedure: ESOPHAGEAL MANOMETRY (EM) with PH;  Surgeon: Midge Minium, MD;  Location: [facility] ENDOSCOPY;  Service: Endoscopy;  Laterality: N/A;  . FACIAL LACERATIONS REPAIR     Social History   Occupational History  . Engineer    Social History Main Topics  . Smoking status: Never Smoker  . Smokeless tobacco: Never Used  . Alcohol use No  . Drug use: No  . Sexual activity: No

## 2017-01-27 ENCOUNTER — Ambulatory Visit: Payer: Medicaid Other | Attending: Family Medicine | Admitting: Family Medicine

## 2017-01-27 ENCOUNTER — Encounter: Payer: Self-pay | Admitting: Family Medicine

## 2017-01-27 VITALS — BP 129/88 | HR 103 | Temp 98.1°F | Resp 18 | Ht 73.0 in | Wt 246.4 lb

## 2017-01-27 DIAGNOSIS — K5900 Constipation, unspecified: Secondary | ICD-10-CM | POA: Diagnosis not present

## 2017-01-27 DIAGNOSIS — N400 Enlarged prostate without lower urinary tract symptoms: Secondary | ICD-10-CM | POA: Insufficient documentation

## 2017-01-27 DIAGNOSIS — H8110 Benign paroxysmal vertigo, unspecified ear: Secondary | ICD-10-CM | POA: Diagnosis not present

## 2017-01-27 DIAGNOSIS — K219 Gastro-esophageal reflux disease without esophagitis: Secondary | ICD-10-CM | POA: Insufficient documentation

## 2017-01-27 DIAGNOSIS — R399 Unspecified symptoms and signs involving the genitourinary system: Secondary | ICD-10-CM

## 2017-01-27 DIAGNOSIS — R110 Nausea: Secondary | ICD-10-CM | POA: Insufficient documentation

## 2017-01-27 DIAGNOSIS — Z79899 Other long term (current) drug therapy: Secondary | ICD-10-CM | POA: Diagnosis not present

## 2017-01-27 DIAGNOSIS — R413 Other amnesia: Secondary | ICD-10-CM | POA: Diagnosis not present

## 2017-01-27 DIAGNOSIS — M545 Low back pain: Secondary | ICD-10-CM | POA: Diagnosis not present

## 2017-01-27 DIAGNOSIS — I10 Essential (primary) hypertension: Secondary | ICD-10-CM | POA: Diagnosis not present

## 2017-01-27 DIAGNOSIS — G4730 Sleep apnea, unspecified: Secondary | ICD-10-CM | POA: Diagnosis not present

## 2017-01-27 DIAGNOSIS — K449 Diaphragmatic hernia without obstruction or gangrene: Secondary | ICD-10-CM

## 2017-01-27 DIAGNOSIS — K429 Umbilical hernia without obstruction or gangrene: Secondary | ICD-10-CM | POA: Diagnosis not present

## 2017-01-27 DIAGNOSIS — E7800 Pure hypercholesterolemia, unspecified: Secondary | ICD-10-CM | POA: Diagnosis not present

## 2017-01-27 MED ORDER — TAMSULOSIN HCL 0.4 MG PO CAPS: 0.4000 mg | ORAL_CAPSULE | Freq: Every day | ORAL | 5 refills | Status: DC

## 2017-01-27 MED ORDER — HYDROCHLOROTHIAZIDE 12.5 MG PO TABS: 12.5000 mg | ORAL_TABLET | Freq: Every day | ORAL | 5 refills | Status: DC

## 2017-01-27 MED ORDER — SIMVASTATIN 10 MG PO TABS: 10.0000 mg | ORAL_TABLET | Freq: Every day | ORAL | 5 refills | Status: DC

## 2017-01-27 MED ORDER — MECLIZINE HCL 25 MG PO TABS: 25.0000 mg | ORAL_TABLET | Freq: Two times a day (BID) | ORAL | 5 refills | Status: DC | PRN

## 2017-01-27 MED FILL — SIMVASTATIN 10 MG TABLET: 10 | 30 days supply | Qty: 30 | Fill #0

## 2017-01-27 MED FILL — TAMSULOSIN HCL 0.4 MG CAP: 0.4 | 30 days supply | Qty: 30 | Fill #0

## 2017-01-27 MED FILL — ?MECLIZINE 25 MG TABLET: 25 | 30 days supply | Qty: 60 | Fill #0

## 2017-01-27 NOTE — Progress Notes (Signed)
Subjective:  Patient ID: Brandon Clark, male    DOB: 12-Apr-1941  Age: 76 y.o. MRN: [id number]  CC: Memory Loss   HPI Brandon Clark is a 76 year old male with a history of hypertension, hyperlipidemia, GERD, Vertigo who comes into the clinic today for follow-up visit.  He takes meclizine for vertigo but still complains of dizziness whenever he changes position.  He would like a referral to a general surgeon to discuss repair of his umbilical and hiatal hernia given he now has Medicaid. He also complains of associated nausea; his hernia is reducible; he has noticed associated bloating, constipation and moves his bowels with the aid of lactulose.  He complains of intermittent forgetfulness like forgetting where he placed his keys, also forgetting the name of a person not long after introduction but he denies memory impairment that affect his finances and does not have a problem remembering driving directions or forgetting to turn off the stove.  Lower urinary tract symptoms are controlled on Flomax and he takes a muscle relaxant intermittently for low back pain.  Past Medical History:  Diagnosis Date  . Acid reflux   . BPH (benign prostatic hyperplasia)   . Hyperlipidemia   . Hypertension   . Sleep apnea   . Ulcer     Past Surgical History:  Procedure Laterality Date  . ESOPHAGEAL MANOMETRY N/A 04/27/2016   Procedure: ESOPHAGEAL MANOMETRY (EM) with [place];  Surgeon: Lucilla Lame, MD;  Location: [facility] ENDOSCOPY;  Service: Endoscopy;  Laterality: N/A;  . FACIAL LACERATIONS REPAIR      No Known Allergies   Outpatient Medications Prior to Visit  Medication Sig Dispense Refill  . dexlansoprazole (DEXILANT) 60 MG capsule Take 1 capsule (60 mg total) by mouth daily. 30 capsule 5  . lactulose (CHRONULAC) 10 GM/15ML solution Take 15 mLs (10 g total) by mouth 2 (two) times daily as needed for mild constipation. 946 mL 1  . meloxicam (MOBIC) 7.5 MG tablet Take 1 tablet (7.5 mg  total) by mouth daily. 60 tablet 0  . tiZANidine (ZANAFLEX) 4 MG tablet Take 1 tablet (4 mg total) by mouth every 8 (eight) hours as needed for muscle spasms. 60 tablet 1  . traMADol (ULTRAM) 50 MG tablet Take 1 tablet (50 mg total) by mouth every 8 (eight) hours as needed. 30 tablet 0  . azithromycin (ZITHROMAX) 250 MG tablet Take 2 today then 1 each day thereafter 6 tablet 0  . benzonatate (TESSALON) 200 MG capsule Take 1 capsule (200 mg total) by mouth 2 (two) times daily as needed for cough. 30 capsule 0  . hydrochlorothiazide (HYDRODIURIL) 12.5 MG tablet Take 1 tablet (12.5 mg total) by mouth daily. 30 tablet 4  . ibuprofen (ADVIL,MOTRIN) 800 MG tablet Take 1 tablet (800 mg total) by mouth every 8 (eight) hours as needed. 60 tablet 2  . meclizine (ANTIVERT) 25 MG tablet Take 1 tablet (25 mg total) by mouth 2 (two) times daily as needed. 60 tablet 2  . methocarbamol (ROBAXIN) 500 MG tablet Take 1 tablet (500 mg total) by mouth every 8 (eight) hours as needed for muscle spasms. 30 tablet 0  . simvastatin (ZOCOR) 10 MG tablet Take 1 tablet (10 mg total) by mouth at bedtime. 30 tablet 4  . tamsulosin (FLOMAX) 0.4 MG CAPS capsule Take 1 capsule (0.4 mg total) by mouth daily. 30 capsule 3   No facility-administered medications prior to visit.     ROS Review of Systems  Constitutional: Negative for activity change and  appetite change.  HENT: Negative for sinus pressure and sore throat.   Eyes: Negative for visual disturbance.  Respiratory: Negative for cough, chest tightness and shortness of breath.   Cardiovascular: Negative for chest pain and leg swelling.  Gastrointestinal: Negative for abdominal distention, abdominal pain, constipation and diarrhea.  Endocrine: Negative.   Genitourinary: Negative for dysuria.  Musculoskeletal: Negative for joint swelling and myalgias.  Skin:       Skin tags  Allergic/Immunologic: Negative.   Neurological: Negative for weakness, light-headedness and  numbness.  Psychiatric/Behavioral: Negative for dysphoric mood and suicidal ideas.    Objective:  BP 129/88 (BP Location: Left Arm, Patient Position: Sitting, Cuff Size: Large)   Pulse (!) 103   Temp 98.1 F (36.7 C) (Oral)   Resp 18   Ht '6\' 1"'  (1.854 m)   Wt 246 lb 6.4 oz (111.8 kg)   SpO2 95%   BMI 32.51 kg/m   BP/Weight 01/27/2017 07/08/2016 50/38/8828  Systolic BP 003 491 791  Diastolic BP 88 79 99  Wt. (Lbs) 246.4 246.2 250  BMI 32.51 29.2 29.65      Physical Exam Constitutional: He is oriented to person, place, and time. He appears well-developed and well-nourished.  Neck : No JVD Cardiovascular: Normal rate, normal heart sounds and intact distal pulses.   No murmur heard. Pulmonary/Chest: Effort normal and breath sounds normal. He has no wheezes. He has no rales. He exhibits no tenderness.  Abdominal: Soft. Bowel sounds are normal. He exhibits mass (ventral and umbilical herniawhich have both reducible and not tender to palpation). He exhibits no distension. There is no tenderness.  Musculoskeletal: Normal range of motion.  Neurological: He is alert and oriented to person, place, and time.  Skin: Multiple skin tags diffusely distributed on trunk Psychological: Normal   CMP Latest Ref Rng & Units 12/18/2015 07/22/2015 12/15/2014  Glucose 65 - 99 mg/dL 112(H) 108(H) 118(H)  BUN 7 - 25 mg/dL '20 19 16  ' Creatinine 0.70 - 1.18 mg/dL 1.10 1.15 1.21  Sodium 135 - 146 mmol/L 141 138 139  Potassium 3.5 - 5.3 mmol/L 3.6 4.6 4.6  Chloride 98 - 110 mmol/L 105 103 102  CO2 20 - 31 mmol/L '26 26 27  ' Calcium 8.6 - 10.3 mg/dL 9.0 9.4 9.7  Total Protein 6.1 - 8.1 g/dL 6.8 - -  Total Bilirubin 0.2 - 1.2 mg/dL 1.0 - -  Alkaline Phos 40 - 115 U/L 56 - -  AST 10 - 35 U/L 18 - -  ALT 9 - 46 U/L 21 - -    Lipid Panel     Component Value Date/Time   CHOL 184 12/18/2015 1622   TRIG 66 12/18/2015 1622   HDL 47 12/18/2015 1622   CHOLHDL 3.9 12/18/2015 1622   VLDL 13 12/18/2015 1622     LDLCALC 124 12/18/2015 1622    Assessment & Plan:   1. Lower urinary tract symptoms Controlled - tamsulosin (FLOMAX) 0.4 MG CAPS capsule; Take 1 capsule (0.4 mg total) by mouth daily.  Dispense: 30 capsule; Refill: 5  2. Pure hypercholesterolemia Controlled Low-cholesterol diet - simvastatin (ZOCOR) 10 MG tablet; Take 1 tablet (10 mg total) by mouth at bedtime.  Dispense: 30 tablet; Refill: 5  3. Essential hypertension Controlled Low-sodium diet - hydrochlorothiazide (HYDRODIURIL) 12.5 MG tablet; Take 1 tablet (12.5 mg total) by mouth daily.  Dispense: 30 tablet; Refill: 5 - CMP14+EGFR - Lipid panel  4. Memory impairment He does have intermittent forgetfulness and has been advised on recording  things to help him stay on top but overall he has no significant impairment We have discussed the role of Aricept and it is not indicated at this time  5. Umbilical hernia without obstruction and without gangrene - Ambulatory referral to General Surgery  6. Benign paroxysmal positional vertigo, unspecified laterality Uncontrolled Advised to change positions slowly - meclizine (ANTIVERT) 25 MG tablet; Take 1 tablet (25 mg total) by mouth 2 (two) times daily as needed.  Dispense: 60 tablet; Refill: 5  7. Hiatal hernia - Ambulatory referral to General Surgery   Meds ordered this encounter  Medications  . tamsulosin (FLOMAX) 0.4 MG CAPS capsule    Sig: Take 1 capsule (0.4 mg total) by mouth daily.    Dispense:  30 capsule    Refill:  5  . simvastatin (ZOCOR) 10 MG tablet    Sig: Take 1 tablet (10 mg total) by mouth at bedtime.    Dispense:  30 tablet    Refill:  5  . hydrochlorothiazide (HYDRODIURIL) 12.5 MG tablet    Sig: Take 1 tablet (12.5 mg total) by mouth daily.    Dispense:  30 tablet    Refill:  5  . meclizine (ANTIVERT) 25 MG tablet    Sig: Take 1 tablet (25 mg total) by mouth 2 (two) times daily as needed.    Dispense:  60 tablet    Refill:  5    Follow-up:  Return in about 3 months (around 04/29/2017) for follow up of medical conditions.    This note has been created with Surveyor, quantity. Any transcriptional errors are unintentional.     Arnoldo Morale MD

## 2017-01-27 NOTE — Progress Notes (Signed)
Patient has not eaten  Patient has not taken his medication but has them

## 2017-01-28 LAB — CMP14+EGFR
A/G RATIO: 1.9 (ref 1.2–2.2)
ALT: 12 IU/L (ref 0–44)
AST: 17 IU/L (ref 0–40)
Albumin: 4.3 g/dL (ref 3.5–4.8)
Alkaline Phosphatase: 60 IU/L (ref 39–117)
BILIRUBIN TOTAL: 0.6 mg/dL (ref 0.0–1.2)
BUN/Creatinine Ratio: 15 (ref 10–24)
BUN: 16 mg/dL (ref 8–27)
CHLORIDE: 103 mmol/L (ref 96–106)
CO2: 22 mmol/L (ref 20–29)
Calcium: 9.4 mg/dL (ref 8.6–10.2)
Creatinine, Ser: 1.09 mg/dL (ref 0.76–1.27)
GFR calc Af Amer: 77 mL/min/{1.73_m2} (ref >59)
GFR calc non Af Amer: 67 mL/min/{1.73_m2} (ref >59)
Globulin, Total: 2.3 g/dL (ref 1.5–4.5)
Glucose: 112 mg/dL — ABNORMAL HIGH (ref 65–99)
POTASSIUM: 4.2 mmol/L (ref 3.5–5.2)
Sodium: 139 mmol/L (ref 134–144)
Total Protein: 6.6 g/dL (ref 6.0–8.5)

## 2017-01-28 LAB — LIPID PANEL
CHOLESTEROL TOTAL: 181 mg/dL (ref 100–199)
Chol/HDL Ratio: 4 ratio (ref 0.0–5.0)
HDL: 45 mg/dL (ref >39)
LDL Calculated: 114 mg/dL — ABNORMAL HIGH (ref 0–99)
TRIGLYCERIDES: 109 mg/dL (ref 0–149)
VLDL Cholesterol Cal: 22 mg/dL (ref 5–40)

## 2017-01-30 ENCOUNTER — Encounter: Payer: Self-pay | Admitting: Family Medicine

## 2017-01-30 ENCOUNTER — Telehealth: Payer: Self-pay | Admitting: *Deleted

## 2017-01-30 DIAGNOSIS — R7303 Prediabetes: Secondary | ICD-10-CM | POA: Insufficient documentation

## 2017-01-30 NOTE — Telephone Encounter (Signed)
Left message on voicemail to call office.   Notes recorded by [name], Enobong, MD on 01/30/2017 at 12:08 PM EDT Cholesterol is normal, blood sugar is slightly elevated as he is prediabetic and will need to work on cutting back on starches, sweets, increase in exercise to prevent development of diabetes. We will check his A1c at his next visit.

## 2017-02-13 MED FILL — DEXILANT DR 60 MG CAPSULE: 60 | 30 days supply | Qty: 30 | Fill #4

## 2017-02-13 MED FILL — MELOXICAM 7.5 MG TABLET: 7.5 | 30 days supply | Qty: 30 | Fill #1

## 2017-02-22 NOTE — Telephone Encounter (Signed)
Notes recorded by Jaclyn Shaggy, MD on 01/30/2017 at 12:08 PM EDT Cholesterol is normal, blood sugar is slightly elevated as he is prediabetic and will need to work on cutting back on starches, sweets, increase in exercise to prevent development of diabetes. We will check his A1c at his next visit.  Left  Message above on voicemail. Instructed  to return call for questions or concerns. Will mail a letter to address.

## 2017-03-15 DIAGNOSIS — K219 Gastro-esophageal reflux disease without esophagitis: Secondary | ICD-10-CM | POA: Diagnosis not present

## 2017-03-15 DIAGNOSIS — M6208 Separation of muscle (nontraumatic), other site: Secondary | ICD-10-CM | POA: Diagnosis not present

## 2017-03-15 DIAGNOSIS — K449 Diaphragmatic hernia without obstruction or gangrene: Secondary | ICD-10-CM | POA: Diagnosis not present

## 2017-03-15 DIAGNOSIS — K429 Umbilical hernia without obstruction or gangrene: Secondary | ICD-10-CM | POA: Diagnosis not present

## 2017-03-15 MED FILL — TAMSULOSIN HCL 0.4 MG CAP: 0.4 | 30 days supply | Qty: 30 | Fill #1

## 2017-03-15 MED FILL — HYDROCHLOROTHIAZIDE 12.5 MG: 12.5 | 30 days supply | Qty: 30 | Fill #3

## 2017-03-15 MED FILL — DEXILANT DR 60 MG CAPSULE: 60 | 30 days supply | Qty: 30 | Fill #5

## 2017-03-29 ENCOUNTER — Other Ambulatory Visit: Payer: Self-pay | Admitting: General Surgery

## 2017-03-29 DIAGNOSIS — K449 Diaphragmatic hernia without obstruction or gangrene: Secondary | ICD-10-CM

## 2017-04-05 ENCOUNTER — Ambulatory Visit
Admission: RE | Admit: 2017-04-05 | Discharge: 2017-04-05 | Disposition: A | Discharge status: Home / Self Care | Payer: Medicaid Other | Source: Ambulatory Visit | Attending: General Surgery | Admitting: General Surgery

## 2017-04-05 DIAGNOSIS — K449 Diaphragmatic hernia without obstruction or gangrene: Secondary | ICD-10-CM

## 2017-04-05 MED ORDER — IOPAMIDOL (ISOVUE-300) INJECTION 61%
125.0000 mL | Freq: Once | INTRAVENOUS | Status: AC | PRN
  Administered 2017-04-05: 125 mL via INTRAVENOUS

## 2017-04-07 ENCOUNTER — Other Ambulatory Visit: Payer: Self-pay | Admitting: Gastroenterology

## 2017-04-07 DIAGNOSIS — R1310 Dysphagia, unspecified: Secondary | ICD-10-CM | POA: Diagnosis not present

## 2017-04-07 DIAGNOSIS — K219 Gastro-esophageal reflux disease without esophagitis: Secondary | ICD-10-CM | POA: Diagnosis not present

## 2017-04-19 ENCOUNTER — Other Ambulatory Visit: Payer: Self-pay | Admitting: Physician Assistant

## 2017-04-19 ENCOUNTER — Encounter (HOSPITAL_COMMUNITY): Payer: Self-pay | Admitting: *Deleted

## 2017-04-19 ENCOUNTER — Ambulatory Visit (HOSPITAL_COMMUNITY)
Admission: RE | Admit: 2017-04-19 | Discharge: 2017-04-19 | Disposition: A | Discharge status: Home / Self Care | Payer: Medicaid Other | Source: Ambulatory Visit | Attending: Gastroenterology | Admitting: Gastroenterology

## 2017-04-19 ENCOUNTER — Encounter (HOSPITAL_COMMUNITY)
Admission: RE | Disposition: A | Discharge status: Home / Self Care | Payer: Self-pay | Source: Ambulatory Visit | Attending: Gastroenterology

## 2017-04-19 DIAGNOSIS — R12 Heartburn: Secondary | ICD-10-CM | POA: Insufficient documentation

## 2017-04-19 HISTORY — PX: ESOPHAGEAL MANOMETRY: SHX5429

## 2017-04-19 SURGERY — MANOMETRY, ESOPHAGUS

## 2017-04-19 MED ORDER — LIDOCAINE VISCOUS 2 % MT SOLN
OROMUCOSAL | Status: AC
  Filled 2017-04-19: qty 15

## 2017-04-19 MED FILL — TAMSULOSIN HCL 0.4 MG CAP: 0.4 | 30 days supply | Qty: 30 | Fill #2

## 2017-04-19 MED FILL — HYDROCHLOROTHIAZIDE 12.5 MG: 12.5 | 30 days supply | Qty: 30 | Fill #4

## 2017-04-19 SURGICAL SUPPLY — 2 items
FACESHIELD LNG OPTICON STERILE (SAFETY) IMPLANT
GLOVE BIO SURGEON STRL SZ8 (GLOVE) ×6 IMPLANT

## 2017-04-19 NOTE — Progress Notes (Signed)
Esophageal manometry done per protocol.  Patient tolerated well.  Dr Bosie ClosSchooler notified of report to be read.

## 2017-04-20 ENCOUNTER — Encounter (HOSPITAL_COMMUNITY): Payer: Self-pay | Admitting: Gastroenterology

## 2017-04-20 MED FILL — DEXILANT DR 60 MG CAPSULE: 60 | 30 days supply | Qty: 30 | Fill #0

## 2017-04-24 ENCOUNTER — Ambulatory Visit (INDEPENDENT_AMBULATORY_CARE_PROVIDER_SITE_OTHER): Payer: Medicaid Other

## 2017-04-24 ENCOUNTER — Ambulatory Visit (INDEPENDENT_AMBULATORY_CARE_PROVIDER_SITE_OTHER): Payer: Self-pay

## 2017-04-24 ENCOUNTER — Ambulatory Visit (INDEPENDENT_AMBULATORY_CARE_PROVIDER_SITE_OTHER): Payer: Medicaid Other | Admitting: Orthopedic Surgery

## 2017-04-24 ENCOUNTER — Encounter (INDEPENDENT_AMBULATORY_CARE_PROVIDER_SITE_OTHER): Payer: Self-pay | Admitting: Orthopedic Surgery

## 2017-04-24 DIAGNOSIS — M25561 Pain in right knee: Principal | ICD-10-CM

## 2017-04-24 DIAGNOSIS — M25562 Pain in left knee: Principal | ICD-10-CM

## 2017-04-24 DIAGNOSIS — M545 Low back pain, unspecified: Secondary | ICD-10-CM

## 2017-04-24 DIAGNOSIS — G8929 Other chronic pain: Secondary | ICD-10-CM

## 2017-04-24 MED ORDER — TRAMADOL HCL 50 MG PO TABS: 50.0000 mg | ORAL_TABLET | Freq: Two times a day (BID) | ORAL | 0 refills | Status: DC | PRN

## 2017-04-25 DIAGNOSIS — M25561 Pain in right knee: Secondary | ICD-10-CM

## 2017-04-25 DIAGNOSIS — G8929 Other chronic pain: Secondary | ICD-10-CM

## 2017-04-25 DIAGNOSIS — M25562 Pain in left knee: Secondary | ICD-10-CM

## 2017-04-25 MED ORDER — METHYLPREDNISOLONE ACETATE 40 MG/ML IJ SUSP
40.0000 mg | INTRAMUSCULAR | Status: AC | PRN
  Administered 2017-04-25: 40 mg via INTRA_ARTICULAR

## 2017-04-25 MED ORDER — LIDOCAINE HCL 1 % IJ SOLN
5.0000 mL | INTRAMUSCULAR | Status: AC | PRN
  Administered 2017-04-25: 5 mL

## 2017-04-25 MED ORDER — BUPIVACAINE HCL 0.25 % IJ SOLN
4.0000 mL | INTRAMUSCULAR | Status: AC | PRN
  Administered 2017-04-25: 4 mL via INTRA_ARTICULAR

## 2017-04-25 MED ORDER — BUPIVACAINE HCL 0.25 % IJ SOLN
4.0000 mL | INTRAMUSCULAR | Status: AC | PRN
Start: 2017-04-25 — End: 2017-04-25
  Administered 2017-04-25: 4 mL via INTRA_ARTICULAR

## 2017-04-25 NOTE — Progress Notes (Signed)
Office Visit Note   Patient: Brandon Clark           Date of Birth: 1941-03-31           MRN: [id number] Visit Date: 04/24/2017 Requested by: Jaclyn Shaggy, MD [address] [place], [place] [address] PCP: Jaclyn Shaggy, MD  Subjective: Chief Complaint  Patient presents with  . Left Knee - Pain  . Right Knee - Pain   Patient is a 76 year old with bilateral knee pain left worse than right.  He reports difficulty with standing.  States that his legs feel heavy.  States that the pain will occasionally wake him from sleep at night.  Reports constant pain with some popping and grinding.  He takes Ultram.  He does not want an MRI scan. HPI: See above              ROS: All systems reviewed are negative as they relate to the chief complaint within the history of present illness.  Patient denies  fevers or chills.   Assessment & Plan: Visit Diagnoses:  1. Chronic pain of both knees   2. 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  Plan is aspiration and injection of both knees today.  Follow-up as needed.  He does not desire any further workup or intervention at this time  Follow-Up Instructions: Return if symptoms worsen or fail to improve.   Orders:  Orders Placed This Encounter  Procedures  . XR KNEE 3 VIEW LEFT  . XR KNEE 3 VIEW RIGHT   Meds ordered this encounter  Medications  . traMADol (ULTRAM) 50 MG tablet    Sig: Take 1 tablet (50 mg total) by mouth every 12 (twelve) hours as needed.    Dispense:  30 tablet    Refill:  0      Procedures: Large Joint Inj Date/Time: 04/25/2017 8:59 AM Performed by: Cammy Copa Authorized by: Cammy Copa   Consent Given by:  Patient Site marked: the procedure site was marked   Timeout: prior to procedure the correct patient, procedure, and site was verified   Indications:   Pain, joint swelling and diagnostic evaluation Location:  Knee Site:  R knee Prep: patient was prepped and draped in usual sterile fashion   Needle Size:  18 G Needle Length:  1.5 inches Approach:  Superolateral Ultrasound Guidance: No   Fluoroscopic Guidance: No   Arthrogram: No   Medications:  5 mL lidocaine 1 %; 4 mL bupivacaine 0.25 %; 40 mg methylPREDNISolone acetate 40 MG/ML Aspiration Attempted: Yes   Aspirate amount (mL):  40 Aspirate:  Yellow Patient tolerance:  Patient tolerated the procedure well with no immediate complications Large Joint Inj Date/Time: 04/25/2017 9:00 AM Performed by: Cammy Copa Authorized by: Cammy Copa   Consent Given by:  Patient Site marked: the procedure site was marked   Timeout: prior to procedure the correct patient, procedure, and site was verified   Indications:  Pain, joint swelling and diagnostic evaluation Location:  Knee Site:  L knee Prep: patient was prepped and draped in usual sterile fashion   Needle Size:  18 G Needle Length:  1.5 inches Approach:  Superolateral Ultrasound Guidance: No   Fluoroscopic Guidance: No   Arthrogram: No   Medications:  5 mL lidocaine 1 %; 4 mL bupivacaine 0.25 %; 40 mg methylPREDNISolone acetate 40 MG/ML Aspiration  Attempted: Yes   Aspirate amount (mL):  40 Patient tolerance:  Patient tolerated the procedure well with no immediate complications     Clinical Data: No additional findings.  Objective: Vital Signs: There were no vitals taken for this visit.  Physical Exam:   Constitutional: Patient appears well-developed HEENT:  Head: Normocephalic Eyes:EOM are normal Neck: Normal range of motion Cardiovascular: Normal rate Pulmonary/chest: Effort normal Neurologic: Patient is alert Skin: Skin is warm Psychiatric: Patient has normal mood and affect    Ortho Exam: Orthopedic exam demonstrates normal gait and alignment mild effusion in both knees intact extensor  mechanism with more patellofemoral crepitus on the left than the right.  Extensor mechanism is intact.  Not much in the way of flexion contractures in either knee.  Pedal pulses palpable.  No groin pain with internal/external rotation of either leg  Specialty Comments:  No specialty comments available.  Imaging: Xr Knee 3 View Left  Result Date: 04/25/2017 AP lateral merchant view left knee reviewed.  There is medial joint space narrowing with osteophyte formation.  There is significant patellofemoral arthritis more severe on the lateral facet.  There is a soft tissue rounded ossification in the posterior aspect of the popliteal region.  No other associated lesions present.  Xr Knee 3 View Right  Result Date: 04/25/2017 AP lateral merchant right knee reviewed shows mild medial joint space narrowing.  With less spur formation and osteophyte formation compared to the left knee.  Less severe patellofemoral arthritis noted on the merchant view.  Bony alignment otherwise normal.    PMFS History: Patient Active Problem List   Diagnosis Date Noted  . Prediabetes 01/30/2017  . Hiatal hernia 01/27/2017  . Acid reflux 02/12/2016  . Sleep apnea 02/12/2016  . Diastasis recti 03/02/2015  . Umbilical hernia 09/16/2014  . BPV (benign positional vertigo) 07/01/2014  . HTN (hypertension) 07/01/2014  . Panic attack   . Encephalopathy, hypertensive   . Ataxia 06/30/2014  . Vertigo 06/30/2014  . Dyspepsia 04/03/2014  . Encounter for colorectal cancer screening 04/03/2014  . Bilateral shoulder pain 09/17/2013  . Bilateral knee pain 02/05/2013   Past Medical History:  Diagnosis Date  . Acid reflux   . BPH (benign prostatic hyperplasia)   . Hyperlipidemia   . Hypertension   . Sleep apnea   . Ulcer     Family History  Problem Relation Age of Onset  . Diabetes Mother   . Diabetes Brother   . Heart attack Neg Hx   . Hyperlipidemia Neg Hx   . Hypertension Neg Hx   . Sudden death Neg Hx      Past Surgical History:  Procedure Laterality Date  . ESOPHAGEAL MANOMETRY N/A 04/27/2016   Procedure: ESOPHAGEAL MANOMETRY (EM) with PH;  Surgeon: Midge Miniumarren Wohl, MD;  Location: [facility] ENDOSCOPY;  Service: Endoscopy;  Laterality: N/A;  . ESOPHAGEAL MANOMETRY N/A 04/19/2017   Procedure: ESOPHAGEAL MANOMETRY (EM);  Surgeon: Charlott RakesSchooler, Vincent, MD;  Location: [facility]COPY;  Service: Endoscopy;  Laterality: N/A;  . FACIAL LACERATIONS REPAIR     Social History   Occupational History  . Engineer    Social History Main Topics  . Smoking status: Never Smoker  . Smokeless tobacco: Never Used  . Alcohol use No  . Drug use: No  . Sexual activity: No

## 2017-04-26 ENCOUNTER — Ambulatory Visit: Payer: Self-pay | Admitting: General Surgery

## 2017-04-26 ENCOUNTER — Ambulatory Visit (INDEPENDENT_AMBULATORY_CARE_PROVIDER_SITE_OTHER): Payer: Self-pay | Admitting: Orthopaedic Surgery

## 2017-04-26 NOTE — H&P (Signed)
History of Present Illness Brandon Clark(Teaira Croft MD; 04/06/2017 10:32 AM) The patient is a 76 year old male who presents for evaluation of reflux esophagitis. Patient comes back in today status post CT scan for evaluation of his hernia. Patient's son is the interpreter. I have reviewed these results personally. Patient states he's continued with reflux type symptoms. There is been no other change in his reflux symptoms since his last visit 2 weeks ago.  Patient was to be scheduled for a manometry test. This is currently still pending. We will have him stop by [facility] to schedule manometry testing.   Allergies No Known Drug Allergies 03/15/2017 Allergies Reconciled   Medication History Dexilant (60MG  Capsule DR, Oral) Active. HydroCHLOROthiazide (12.5MG  Capsule, Oral) Active. Lactulose (10GM/15ML Solution, Oral) Active. Meclizine HCl (25MG  Tablet, Oral) Active. Meloxicam (7.5MG  Tablet, Oral) Active. Simvastatin (10MG  Tablet, Oral) Active. Tamsulosin HCl (0.4MG  Capsule, Oral) Active. Zanaflex (4MG  Tablet, Oral) Active. TraMADol HCl (50MG  Tablet, Oral) Active. Medications Reconciled    Review of Systems General Present- Appetite Loss and Night Sweats. Not Present- Chills, Fatigue, Fever, Weight Gain and Weight Loss. Skin Not Present- Change in Wart/Mole, Dryness, Hives, Jaundice, New Lesions, Non-Healing Wounds, Rash and Ulcer. HEENT Present- Earache, Ringing in the Ears, Sore Throat and Wears glasses/contact lenses. Not Present- Hearing Loss, Hoarseness, Nose Bleed, Oral Ulcers, Seasonal Allergies, Sinus Pain, Visual Disturbances and Yellow Eyes. Respiratory Present- Snoring. Not Present- Bloody sputum, Chronic Cough, Difficulty Breathing and Wheezing. Breast Present- Breast Mass and Breast Pain. Not Present- Nipple Discharge and Skin Changes. Cardiovascular Present- Difficulty Breathing Lying Down, Leg Cramps, Rapid Heart Rate, Shortness of Breath and Swelling of  Extremities. Not Present- Chest Pain and Palpitations. Gastrointestinal Present- Abdominal Pain, Bloating, Bloody Stool, Constipation, Difficulty Swallowing, Excessive gas, Gets full quickly at meals, Nausea and Vomiting. Not Present- Change in Bowel Habits, Chronic diarrhea, Hemorrhoids, Indigestion and Rectal Pain. Male Genitourinary Present- Change in Urinary Stream, Painful Urination and Urine Leakage. Not Present- Blood in Urine, Frequency, Impotence, Nocturia and Urgency. Musculoskeletal Present- Back Pain, Joint Pain, Joint Stiffness, Muscle Pain, Muscle Weakness and Swelling of Extremities. Neurological Present- Decreased Memory, Fainting, Headaches, Numbness, Tingling, Trouble walking and Weakness. Not Present- Seizures and Tremor. Psychiatric Present- Anxiety and Frequent crying. Not Present- Bipolar, Change in Sleep Pattern, Depression and Fearful. Endocrine Present- Hair Changes and Heat Intolerance. Not Present- Cold Intolerance, Excessive Hunger and New Diabetes.      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 We will have him undergo manometry testing to evaluate and rule out achalasia 2. As long as his comes back not as achalasia we will proceed with robotic hiatal hernia repair and Nissen fundoplication. 3. I discussed the  risks and benefits of the procedure to include but not limited to: Infection, bleeding, damages running structures, possible pneumothorax, possible recurrence. The patient voiced understanding and wishes to proceed. UMBILICAL  HERNIA WITHOUT OBSTRUCTION OR GANGRENE (K42.9) Impression: This can likely be repaired at the same time as the laparoscopic approach to his hiatal hernia DIASTASIS RECTI (M62.08) Impression: No treatment necessary and I discussed this with him

## 2017-05-08 ENCOUNTER — Other Ambulatory Visit: Payer: Self-pay | Admitting: General Surgery

## 2017-05-08 ENCOUNTER — Ambulatory Visit: Payer: Self-pay | Admitting: General Surgery

## 2017-05-12 ENCOUNTER — Ambulatory Visit: Payer: Medicaid Other | Attending: Family Medicine | Admitting: Family Medicine

## 2017-05-12 ENCOUNTER — Encounter: Payer: Self-pay | Admitting: Family Medicine

## 2017-05-12 ENCOUNTER — Other Ambulatory Visit: Payer: Self-pay | Admitting: Pharmacist

## 2017-05-12 VITALS — BP 124/78 | HR 84 | Temp 98.0°F | Ht 73.0 in | Wt 259.4 lb

## 2017-05-12 DIAGNOSIS — F0280 Dementia in other diseases classified elsewhere without behavioral disturbance: Secondary | ICD-10-CM | POA: Diagnosis not present

## 2017-05-12 DIAGNOSIS — E785 Hyperlipidemia, unspecified: Secondary | ICD-10-CM | POA: Diagnosis not present

## 2017-05-12 DIAGNOSIS — G4733 Obstructive sleep apnea (adult) (pediatric): Secondary | ICD-10-CM | POA: Insufficient documentation

## 2017-05-12 DIAGNOSIS — R399 Unspecified symptoms and signs involving the genitourinary system: Secondary | ICD-10-CM | POA: Diagnosis not present

## 2017-05-12 DIAGNOSIS — N400 Enlarged prostate without lower urinary tract symptoms: Secondary | ICD-10-CM | POA: Insufficient documentation

## 2017-05-12 DIAGNOSIS — G308 Other Alzheimer's disease: Secondary | ICD-10-CM

## 2017-05-12 DIAGNOSIS — Z9119 Patient's noncompliance with other medical treatment and regimen: Secondary | ICD-10-CM | POA: Insufficient documentation

## 2017-05-12 DIAGNOSIS — G4700 Insomnia, unspecified: Secondary | ICD-10-CM | POA: Insufficient documentation

## 2017-05-12 DIAGNOSIS — R7303 Prediabetes: Secondary | ICD-10-CM | POA: Diagnosis not present

## 2017-05-12 DIAGNOSIS — I10 Essential (primary) hypertension: Secondary | ICD-10-CM | POA: Diagnosis not present

## 2017-05-12 DIAGNOSIS — G309 Alzheimer's disease, unspecified: Secondary | ICD-10-CM | POA: Diagnosis not present

## 2017-05-12 DIAGNOSIS — R42 Dizziness and giddiness: Secondary | ICD-10-CM | POA: Diagnosis not present

## 2017-05-12 DIAGNOSIS — E7800 Pure hypercholesterolemia, unspecified: Secondary | ICD-10-CM | POA: Diagnosis not present

## 2017-05-12 DIAGNOSIS — K219 Gastro-esophageal reflux disease without esophagitis: Secondary | ICD-10-CM | POA: Insufficient documentation

## 2017-05-12 DIAGNOSIS — Z79899 Other long term (current) drug therapy: Secondary | ICD-10-CM | POA: Insufficient documentation

## 2017-05-12 DIAGNOSIS — F0390 Unspecified dementia without behavioral disturbance: Secondary | ICD-10-CM | POA: Insufficient documentation

## 2017-05-12 DIAGNOSIS — G4709 Other insomnia: Secondary | ICD-10-CM

## 2017-05-12 LAB — POCT GLYCOSYLATED HEMOGLOBIN (HGB A1C): Hemoglobin A1C: 6.1

## 2017-05-12 MED ORDER — HYDROCHLOROTHIAZIDE 12.5 MG PO TABS: 12.5000 mg | ORAL_TABLET | Freq: Every day | ORAL | 5 refills | Status: DC

## 2017-05-12 MED ORDER — DONEPEZIL HCL 10 MG PO TABS: 10.0000 mg | ORAL_TABLET | Freq: Every day | ORAL | 3 refills | Status: DC

## 2017-05-12 MED ORDER — SIMVASTATIN 10 MG PO TABS: 10.0000 mg | ORAL_TABLET | Freq: Every day | ORAL | 5 refills | Status: DC

## 2017-05-12 MED ORDER — TRAZODONE HCL 50 MG PO TABS: 50.0000 mg | ORAL_TABLET | Freq: Every evening | ORAL | 3 refills | Status: DC | PRN

## 2017-05-12 MED ORDER — TAMSULOSIN HCL 0.4 MG PO CAPS: 0.4000 mg | ORAL_CAPSULE | Freq: Every day | ORAL | 5 refills | Status: DC

## 2017-05-12 MED FILL — SIMVASTATIN 10 MG TABLET: 10 | 30 days supply | Qty: 30 | Fill #0

## 2017-05-12 MED FILL — HYDROCHLOROTHIAZIDE 12.5 MG: 12.5 | 30 days supply | Qty: 30 | Fill #0

## 2017-05-12 MED FILL — traZODone HCL 50 MG TABS: 50 | 30 days supply | Qty: 30 | Fill #0

## 2017-05-12 MED FILL — TAMSULOSIN HCL 0.4 MG CAP: 0.4 | 30 days supply | Qty: 30 | Fill #0

## 2017-05-12 MED FILL — DONEPEZIL HCL 10 MG TABLET: 10 | 30 days supply | Qty: 30 | Fill #0

## 2017-05-12 MED FILL — traMADol HCL 50 MG TABS: 50 | 15 days supply | Qty: 30 | Fill #0

## 2017-05-12 NOTE — Patient Instructions (Signed)

## 2017-05-12 NOTE — Progress Notes (Signed)
Subjective:  Patient ID: Brandon Clark, male    DOB: 1940/12/31  Age: 76 y.o. MRN: [id number]  CC: Hypertension   HPI Brandon Clark is a 76 year old male with a history of hypertension, hyperlipidemia, GERD, Vertigo who comes into the clinic today for follow-up visit.  He complains of insomnia and sleeps for one hour after which he is awake and unable to fall back to sleep. His CPAP machine which he uses for sleep apnea does not help he states and so he has not been compliant with it as it also prevents him from breathing. He only takes cafeinated products early in the morning.  He complains of memory loss which he had also complained of at his last visit  Which is concerning to him. He is not forgetting directions or finance related issues but has noticed short term memory loss.  He has been compliant with his antihypertensive but not his statin which he states he never received from the pharmacy.  He is scheduled for umbilical and ventral hernia repair on 06/02/17.   Past Medical History:  Diagnosis Date  . Acid reflux   . BPH (benign prostatic hyperplasia)   . Hyperlipidemia   . Hypertension   . Sleep apnea   . Ulcer     Past Surgical History:  Procedure Laterality Date  . FACIAL LACERATIONS REPAIR      No Known Allergies   Outpatient Medications Prior to Visit  Medication Sig Dispense Refill  . lactulose (CHRONULAC) 10 GM/15ML solution Take 15 mLs (10 g total) by mouth 2 (two) times daily as needed for mild constipation. 946 mL 1  . hydrochlorothiazide (HYDRODIURIL) 12.5 MG tablet Take 1 tablet (12.5 mg total) by mouth daily. 30 tablet 5  . tamsulosin (FLOMAX) 0.4 MG CAPS capsule Take 1 capsule (0.4 mg total) by mouth daily. 30 capsule 5  . DEXILANT 60 MG capsule TAKE ONE CAPSULE BY MOUTH DAILY (Patient not taking: Reported on 05/12/2017) 30 capsule 0  . traMADol (ULTRAM) 50 MG tablet Take 1 tablet (50 mg total) by mouth every 12 (twelve) hours as needed.  (Patient not taking: Reported on 05/12/2017) 30 tablet 0  . meclizine (ANTIVERT) 25 MG tablet Take 1 tablet (25 mg total) by mouth 2 (two) times daily as needed. (Patient not taking: Reported on 05/12/2017) 60 tablet 5  . meloxicam (MOBIC) 7.5 MG tablet Take 1 tablet (7.5 mg total) by mouth daily. (Patient not taking: Reported on 05/12/2017) 60 tablet 0  . simvastatin (ZOCOR) 10 MG tablet Take 1 tablet (10 mg total) by mouth at bedtime. (Patient not taking: Reported on 05/12/2017) 30 tablet 5  . tiZANidine (ZANAFLEX) 4 MG tablet Take 1 tablet (4 mg total) by mouth every 8 (eight) hours as needed for muscle spasms. (Patient not taking: Reported on 05/12/2017) 60 tablet 1   No facility-administered medications prior to visit.     ROS Review of Systems  Constitutional: Negative for activity change and appetite change.  HENT: Negative for sinus pressure and sore throat.   Eyes: Negative for visual disturbance.  Respiratory: Negative for cough, chest tightness and shortness of breath.   Cardiovascular: Negative for chest pain and leg swelling.  Gastrointestinal: Negative for abdominal distention, abdominal pain, constipation and diarrhea.  Endocrine: Negative.   Genitourinary: Negative for dysuria.  Musculoskeletal: Negative for joint swelling and myalgias.  Skin: Negative for rash.  Allergic/Immunologic: Negative.   Neurological: Negative for weakness, light-headedness and numbness.  Psychiatric/Behavioral: Positive for sleep disturbance. Negative for dysphoric  mood and suicidal ideas.    Objective:  BP 124/78   Pulse 84   Temp 98 F (36.7 C) (Oral)   Ht 6\' 1"  (1.854 m)   Wt 259 lb 6.4 oz (117.7 kg)   SpO2 95%   BMI 34.22 kg/m   BP/Weight 05/12/2017 01/27/2017 07/08/2016  Systolic BP 124 129 124  Diastolic BP 78 88 79  Wt. (Lbs) 259.4 246.4 246.2  BMI 34.22 32.51 29.2      Physical Exam  Constitutional: He is oriented to person, place, and time. He appears well-developed and  well-nourished.  Cardiovascular: Normal rate, normal heart sounds and intact distal pulses.  No murmur heard. Pulmonary/Chest: Effort normal and breath sounds normal. He has no wheezes. He has no rales. He exhibits no tenderness.  Abdominal: Soft. Bowel sounds are normal. He exhibits mass (ventral and umbilical hernia which are reducible). He exhibits no distension. There is no tenderness.  Musculoskeletal: Normal range of motion.  Neurological: He is alert and oriented to person, place, and time.  Psychiatric: He has a normal mood and affect.     Assessment & Plan:   1. Essential hypertension Controlled Low sodium, DASH diet - hydrochlorothiazide (HYDRODIURIL) 12.5 MG tablet; Take 1 tablet (12.5 mg total) daily by mouth.  Dispense: 30 tablet; Refill: 5  2. Obstructive sleep apnea syndrome Not compliant with CPAP use Advised to be more compliant - might need to get in touch with DME company to reassess equipment for proper fitting Will place on Trazodone due to persisting insomnia  3. 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 - donepezil (ARICEPT) 10 MG tablet; Take 1 tablet (10 mg total) at bedtime by mouth.  Dispense: 30 tablet; Refill: 3  4. Lower urinary tract symptoms Controlled - tamsulosin (FLOMAX) 0.4 MG CAPS capsule; Take 1 capsule (0.4 mg total) daily by mouth.  Dispense: 30 capsule; Refill: 5  5. Pure hypercholesterolemia Has not been compliant with Zocor and encouraged to be more compliant Low cholesterol diet - simvastatin (ZOCOR) 10 MG tablet; Take 1 tablet (10 mg total) at bedtime by mouth.  Dispense: 30 tablet; Refill: 5  6. Prediabetes Diet controlled with A1c 6.1  7. Other insomnia Commenced Trazodone - traZODone (DESYREL) 50 MG tablet; Take 1 tablet (50 mg total) at bedtime as needed by mouth for sleep.  Dispense: 30 tablet; Refill:  3   Meds ordered this encounter  Medications  . hydrochlorothiazide (HYDRODIURIL) 12.5 MG tablet    Sig: Take 1 tablet (12.5 mg total) daily by mouth.    Dispense:  30 tablet    Refill:  5  . tamsulosin (FLOMAX) 0.4 MG CAPS capsule    Sig: Take 1 capsule (0.4 mg total) daily by mouth.    Dispense:  30 capsule    Refill:  5  . traZODone (DESYREL) 50 MG tablet    Sig: Take 1 tablet (50 mg total) at bedtime as needed by mouth for sleep.    Dispense:  30 tablet    Refill:  3  . donepezil (ARICEPT) 10 MG tablet    Sig: Take 1 tablet (10 mg total) at bedtime by mouth.    Dispense:  30 tablet    Refill:  3  . simvastatin (ZOCOR) 10 MG tablet    Sig: Take 1 tablet (10 mg total) at bedtime by mouth.    Dispense:  30 tablet  Refill:  5    Follow-up: Return in about 3 months (around 08/12/2017) for follow up on chronic medical conditions.   Jaclyn ShaggyEnobong Amao MD

## 2017-05-13 ENCOUNTER — Encounter: Payer: Self-pay | Admitting: Family Medicine

## 2017-05-22 ENCOUNTER — Other Ambulatory Visit: Payer: Self-pay | Admitting: Physician Assistant

## 2017-05-29 ENCOUNTER — Other Ambulatory Visit: Payer: Self-pay | Admitting: Family Medicine

## 2017-05-29 MED ORDER — DEXLANSOPRAZOLE 60 MG PO CPDR
1.0000 | DELAYED_RELEASE_CAPSULE | Freq: Every day | ORAL | 1 refills | Status: DC
Start: 2017-05-29 — End: 2017-08-31

## 2017-05-30 MED FILL — DEXILANT DR 60 MG CAPSULE: 60 | 30 days supply | Qty: 30 | Fill #0

## 2017-05-30 NOTE — Progress Notes (Signed)
HGBA1C 05-12-17 epic

## 2017-05-30 NOTE — Patient Instructions (Addendum)
Brandon Clark  05/30/2017   Your procedure is scheduled on: 06-02-17  [facility]  Entrance Take [place]  elevators to 3rd floor to  Short Stay Center at 530AM.    Call this number if you have problems the morning of surgery [phone]   Remember: ONLY 1 PERSON MAY GO WITH YOU TO SHORT STAY TO GET  READY MORNING OF YOUR SURGERY.  Do not eat food or drink liquids :After Midnight.    PLEASE BRING CPAP MASK AND TUBING TO THE HOSPITAL. THE DEVICE WILL BE PROVIDED!   Take these medicines the morning of surgery with A SIP OF WATER: dexilant, tamsulosin                                You may not have any metal on your body including hair pins and              piercings  Do not wear jewelry, make-up, lotions, powders or perfumes, deodorant                        Men may shave face and neck.   Do not bring valuables to the hospital. [facility] IS NOT             RESPONSIBLE   FOR VALUABLES.  Contacts, dentures or bridgework may not be worn into surgery.  Leave suitcase in the car. After surgery it may be brought to your room.                 Please read over the following fact sheets you were given: _____________________________________________________________________             [facility] - Preparing for Surgery Before surgery, you can play an important role.  Because skin is not sterile, your skin needs to be as free of germs as possible.  You can reduce the number of germs on your skin by washing with CHG (chlorahexidine gluconate) soap before surgery.  CHG is an antiseptic cleaner which kills germs and bonds with the skin to continue killing germs even after washing. Please DO NOT use if you have an allergy to CHG or antibacterial soaps.  If your skin becomes reddened/irritated stop using the CHG and inform your nurse when you arrive at Short Stay. Do not shave (including legs and underarms) for at least 48 hours prior to the first CHG  shower.  You may shave your face/neck. Please follow these instructions carefully:  1.  Shower with CHG Soap the night before surgery and the  morning of Surgery.  2.  If you choose to wash your hair, wash your hair first as usual with your  normal  shampoo.  3.  After you shampoo, rinse your hair and body thoroughly to remove the  shampoo.                           4.  Use CHG as you would any other liquid soap.  You can apply chg directly  to the skin and wash                       Gently with a scrungie or clean washcloth.  5.  Apply the CHG Soap to your  body ONLY FROM THE NECK DOWN.   Do not use on face/ open                           Wound or open sores. Avoid contact with eyes, ears mouth and genitals (private parts).                       Wash face,  Genitals (private parts) with your normal soap.             6.  Wash thoroughly, paying special attention to the area where your surgery  will be performed.  7.  Thoroughly rinse your body with warm water from the neck down.  8.  DO NOT shower/wash with your normal soap after using and rinsing off  the CHG Soap.                9.  Pat yourself dry with a clean towel.            10.  Wear clean pajamas.            11.  Place clean sheets on your bed the night of your first shower and do not  sleep with pets. Day of Surgery : Do not apply any lotions/deodorants the morning of surgery.  Please wear clean clothes to the hospital/surgery center.  FAILURE TO FOLLOW THESE INSTRUCTIONS MAY RESULT IN THE CANCELLATION OF YOUR SURGERY PATIENT SIGNATURE_________________________________  NURSE SIGNATURE__________________________________  ________________________________________________________________________

## 2017-06-01 ENCOUNTER — Encounter (HOSPITAL_COMMUNITY)
Admission: RE | Admit: 2017-06-01 | Discharge: 2017-06-01 | Disposition: A | Discharge status: Home / Self Care | Payer: Medicaid Other | Source: Ambulatory Visit | Attending: General Surgery | Admitting: General Surgery

## 2017-06-01 ENCOUNTER — Other Ambulatory Visit: Payer: Self-pay

## 2017-06-01 ENCOUNTER — Encounter (HOSPITAL_COMMUNITY): Payer: Self-pay

## 2017-06-01 DIAGNOSIS — R0683 Snoring: Secondary | ICD-10-CM | POA: Diagnosis not present

## 2017-06-01 DIAGNOSIS — K429 Umbilical hernia without obstruction or gangrene: Secondary | ICD-10-CM | POA: Diagnosis not present

## 2017-06-01 DIAGNOSIS — K210 Gastro-esophageal reflux disease with esophagitis: Secondary | ICD-10-CM | POA: Diagnosis not present

## 2017-06-01 DIAGNOSIS — K449 Diaphragmatic hernia without obstruction or gangrene: Secondary | ICD-10-CM | POA: Diagnosis not present

## 2017-06-01 DIAGNOSIS — Z79891 Long term (current) use of opiate analgesic: Secondary | ICD-10-CM | POA: Diagnosis not present

## 2017-06-01 DIAGNOSIS — K224 Dyskinesia of esophagus: Secondary | ICD-10-CM | POA: Diagnosis not present

## 2017-06-01 DIAGNOSIS — Z79899 Other long term (current) drug therapy: Secondary | ICD-10-CM | POA: Diagnosis not present

## 2017-06-01 DIAGNOSIS — M6208 Separation of muscle (nontraumatic), other site: Secondary | ICD-10-CM | POA: Diagnosis not present

## 2017-06-01 DIAGNOSIS — F419 Anxiety disorder, unspecified: Secondary | ICD-10-CM | POA: Diagnosis not present

## 2017-06-01 DIAGNOSIS — G4730 Sleep apnea, unspecified: Secondary | ICD-10-CM | POA: Diagnosis not present

## 2017-06-01 DIAGNOSIS — Z6834 Body mass index (BMI) 34.0-34.9, adult: Secondary | ICD-10-CM | POA: Diagnosis not present

## 2017-06-01 HISTORY — DX: Diaphragmatic hernia without obstruction or gangrene: K44.9

## 2017-06-01 HISTORY — DX: Prediabetes: R73.03

## 2017-06-01 LAB — BASIC METABOLIC PANEL
Anion gap: 6 (ref 5–15)
BUN: 19 mg/dL (ref 6–20)
CALCIUM: 8.8 mg/dL — AB (ref 8.9–10.3)
CO2: 26 mmol/L (ref 22–32)
CREATININE: 1.11 mg/dL (ref 0.61–1.24)
Chloride: 104 mmol/L (ref 101–111)
GFR calc non Af Amer: >60 mL/min (ref >60)
Glucose, Bld: 114 mg/dL — ABNORMAL HIGH (ref 65–99)
Potassium: 3.8 mmol/L (ref 3.5–5.1)
SODIUM: 136 mmol/L (ref 135–145)

## 2017-06-01 LAB — CBC
HCT: 45.1 % (ref 39.0–52.0)
Hemoglobin: 15.2 g/dL (ref 13.0–17.0)
MCH: 29.3 pg (ref 26.0–34.0)
MCHC: 33.7 g/dL (ref 30.0–36.0)
MCV: 87.1 fL (ref 78.0–100.0)
Platelets: 228 10*3/uL (ref 150–400)
RBC: 5.18 MIL/uL (ref 4.22–5.81)
RDW: 13.7 % (ref 11.5–15.5)
WBC: 4.8 10*3/uL (ref 4.0–10.5)

## 2017-06-02 ENCOUNTER — Ambulatory Visit (HOSPITAL_COMMUNITY): Payer: Medicaid Other

## 2017-06-02 ENCOUNTER — Encounter (HOSPITAL_COMMUNITY): Payer: Self-pay | Admitting: *Deleted

## 2017-06-02 ENCOUNTER — Encounter (HOSPITAL_COMMUNITY)
Admission: RE | Disposition: A | Discharge status: Home / Self Care | Payer: Self-pay | Source: Ambulatory Visit | Attending: General Surgery

## 2017-06-02 ENCOUNTER — Inpatient Hospital Stay (HOSPITAL_COMMUNITY)
Admission: RE | Admit: 2017-06-02 | Discharge: 2017-06-06 | DRG: 328 | Disposition: A | Discharge status: Home / Self Care | Payer: Medicaid Other | Source: Ambulatory Visit | Attending: General Surgery | Admitting: General Surgery

## 2017-06-02 DIAGNOSIS — R0683 Snoring: Secondary | ICD-10-CM | POA: Diagnosis present

## 2017-06-02 DIAGNOSIS — G4730 Sleep apnea, unspecified: Secondary | ICD-10-CM | POA: Diagnosis present

## 2017-06-02 DIAGNOSIS — Z9889 Other specified postprocedural states: Secondary | ICD-10-CM

## 2017-06-02 DIAGNOSIS — M6208 Separation of muscle (nontraumatic), other site: Secondary | ICD-10-CM | POA: Diagnosis present

## 2017-06-02 DIAGNOSIS — K429 Umbilical hernia without obstruction or gangrene: Secondary | ICD-10-CM | POA: Diagnosis present

## 2017-06-02 DIAGNOSIS — Z6834 Body mass index (BMI) 34.0-34.9, adult: Secondary | ICD-10-CM

## 2017-06-02 DIAGNOSIS — F419 Anxiety disorder, unspecified: Secondary | ICD-10-CM | POA: Diagnosis not present

## 2017-06-02 DIAGNOSIS — K224 Dyskinesia of esophagus: Secondary | ICD-10-CM | POA: Diagnosis present

## 2017-06-02 DIAGNOSIS — K210 Gastro-esophageal reflux disease with esophagitis: Secondary | ICD-10-CM | POA: Diagnosis not present

## 2017-06-02 DIAGNOSIS — Z79899 Other long term (current) drug therapy: Secondary | ICD-10-CM

## 2017-06-02 DIAGNOSIS — Z79891 Long term (current) use of opiate analgesic: Secondary | ICD-10-CM

## 2017-06-02 DIAGNOSIS — K449 Diaphragmatic hernia without obstruction or gangrene: Principal | ICD-10-CM | POA: Diagnosis present

## 2017-06-02 DIAGNOSIS — R079 Chest pain, unspecified: Secondary | ICD-10-CM | POA: Diagnosis not present

## 2017-06-02 DIAGNOSIS — R0602 Shortness of breath: Secondary | ICD-10-CM

## 2017-06-02 HISTORY — PX: INSERTION OF MESH: SHX5868

## 2017-06-02 SURGERY — FUNDOPLICATION, NISSEN, ROBOT-ASSISTED, LAPAROSCOPIC
Anesthesia: General

## 2017-06-02 MED ORDER — EPHEDRINE 5 MG/ML INJ
INTRAVENOUS | Status: AC
  Filled 2017-06-02: qty 10

## 2017-06-02 MED ORDER — BUPIVACAINE-EPINEPHRINE 0.25% -1:200000 IJ SOLN
INTRAMUSCULAR | Status: DC | PRN
  Administered 2017-06-02: 11 mL

## 2017-06-02 MED ORDER — CHLORHEXIDINE GLUCONATE CLOTH 2 % EX PADS: 6.0000 | MEDICATED_PAD | Freq: Once | CUTANEOUS | Status: DC

## 2017-06-02 MED ORDER — SUGAMMADEX SODIUM 500 MG/5ML IV SOLN
INTRAVENOUS | Status: DC | PRN
  Administered 2017-06-02: 500 mg via INTRAVENOUS

## 2017-06-02 MED ORDER — CEFAZOLIN SODIUM-DEXTROSE 2-4 GM/100ML-% IV SOLN
INTRAVENOUS | Status: AC
  Filled 2017-06-02: qty 100

## 2017-06-02 MED ORDER — ROCURONIUM BROMIDE 50 MG/5ML IV SOSY
PREFILLED_SYRINGE | INTRAVENOUS | Status: AC
  Filled 2017-06-02: qty 5

## 2017-06-02 MED ORDER — ONDANSETRON 4 MG PO TBDP
4.0000 mg | ORAL_TABLET | Freq: Four times a day (QID) | ORAL | Status: DC | PRN
Start: 2017-06-02 — End: 2017-06-06

## 2017-06-02 MED ORDER — ROCURONIUM BROMIDE 10 MG/ML (PF) SYRINGE
PREFILLED_SYRINGE | INTRAVENOUS | Status: DC | PRN
  Administered 2017-06-02: 30 mg via INTRAVENOUS
  Administered 2017-06-02: 20 mg via INTRAVENOUS
  Administered 2017-06-02: 30 mg via INTRAVENOUS
  Administered 2017-06-02: 20 mg via INTRAVENOUS

## 2017-06-02 MED ORDER — BUPIVACAINE-EPINEPHRINE (PF) 0.25% -1:200000 IJ SOLN
INTRAMUSCULAR | Status: AC
  Filled 2017-06-02: qty 30

## 2017-06-02 MED ORDER — LACTATED RINGERS IV SOLN
INTRAVENOUS | Status: DC | PRN
  Administered 2017-06-02 (×2): via INTRAVENOUS

## 2017-06-02 MED ORDER — DEXTROSE-NACL 5-0.9 % IV SOLN
INTRAVENOUS | Status: DC
  Administered 2017-06-02 – 2017-06-05 (×8): via INTRAVENOUS

## 2017-06-02 MED ORDER — ACETAMINOPHEN 500 MG PO TABS
1000.0000 mg | ORAL_TABLET | ORAL | Status: AC
  Administered 2017-06-02: 1000 mg via ORAL
  Filled 2017-06-02: qty 2

## 2017-06-02 MED ORDER — PHENYLEPHRINE HCL 10 MG/ML IJ SOLN
INTRAMUSCULAR | Status: DC | PRN
  Administered 2017-06-02: 80 ug via INTRAVENOUS
  Administered 2017-06-02: 40 ug via INTRAVENOUS
  Administered 2017-06-02 (×4): 80 ug via INTRAVENOUS

## 2017-06-02 MED ORDER — FENTANYL CITRATE (PF) 100 MCG/2ML IJ SOLN
INTRAMUSCULAR | Status: DC | PRN
  Administered 2017-06-02 (×3): 25 ug via INTRAVENOUS
  Administered 2017-06-02 (×2): 50 ug via INTRAVENOUS
  Administered 2017-06-02: 25 ug via INTRAVENOUS

## 2017-06-02 MED ORDER — LIDOCAINE 2% (20 MG/ML) 5 ML SYRINGE
INTRAMUSCULAR | Status: DC | PRN
  Administered 2017-06-02: 100 mg via INTRAVENOUS

## 2017-06-02 MED ORDER — CELECOXIB 200 MG PO CAPS
200.0000 mg | ORAL_CAPSULE | ORAL | Status: AC
  Administered 2017-06-02: 200 mg via ORAL
  Filled 2017-06-02: qty 1

## 2017-06-02 MED ORDER — EPHEDRINE SULFATE 50 MG/ML IJ SOLN
INTRAMUSCULAR | Status: DC | PRN
  Administered 2017-06-02 (×3): 10 mg via INTRAVENOUS
  Administered 2017-06-02 (×2): 5 mg via INTRAVENOUS

## 2017-06-02 MED ORDER — OXYCODONE HCL 5 MG/5ML PO SOLN: 5.0000 mg | Freq: Once | ORAL | Status: DC | PRN

## 2017-06-02 MED ORDER — ONDANSETRON HCL 4 MG/2ML IJ SOLN
INTRAMUSCULAR | Status: DC | PRN
  Administered 2017-06-02: 4 mg via INTRAVENOUS

## 2017-06-02 MED ORDER — KETOROLAC TROMETHAMINE 30 MG/ML IJ SOLN
INTRAMUSCULAR | Status: AC
  Filled 2017-06-02: qty 1

## 2017-06-02 MED ORDER — ONDANSETRON HCL 4 MG/2ML IJ SOLN
4.0000 mg | Freq: Four times a day (QID) | INTRAMUSCULAR | Status: DC | PRN
  Administered 2017-06-03 – 2017-06-04 (×2): 4 mg via INTRAVENOUS
  Filled 2017-06-02 (×2): qty 2

## 2017-06-02 MED ORDER — LIDOCAINE HCL 2 % IJ SOLN
INTRAMUSCULAR | Status: AC
  Filled 2017-06-02: qty 20

## 2017-06-02 MED ORDER — OXYCODONE HCL 5 MG PO TABS: 5.0000 mg | ORAL_TABLET | Freq: Once | ORAL | Status: DC | PRN

## 2017-06-02 MED ORDER — PROPOFOL 10 MG/ML IV BOLUS
INTRAVENOUS | Status: DC | PRN
  Administered 2017-06-02: 150 mg via INTRAVENOUS
  Administered 2017-06-02: 50 mg via INTRAVENOUS

## 2017-06-02 MED ORDER — FENTANYL CITRATE (PF) 100 MCG/2ML IJ SOLN
INTRAMUSCULAR | Status: AC
  Filled 2017-06-02: qty 2

## 2017-06-02 MED ORDER — PROPOFOL 10 MG/ML IV BOLUS
INTRAVENOUS | Status: AC
  Filled 2017-06-02: qty 40

## 2017-06-02 MED ORDER — FENTANYL CITRATE (PF) 250 MCG/5ML IJ SOLN
INTRAMUSCULAR | Status: AC
  Filled 2017-06-02: qty 5

## 2017-06-02 MED ORDER — SUCCINYLCHOLINE CHLORIDE 200 MG/10ML IV SOSY
PREFILLED_SYRINGE | INTRAVENOUS | Status: DC | PRN
Start: 2017-06-02 — End: 2017-06-02
  Administered 2017-06-02: 120 mg via INTRAVENOUS

## 2017-06-02 MED ORDER — DEXAMETHASONE SODIUM PHOSPHATE 10 MG/ML IJ SOLN
INTRAMUSCULAR | Status: DC | PRN
  Administered 2017-06-02: 10 mg via INTRAVENOUS

## 2017-06-02 MED ORDER — LACTATED RINGERS IR SOLN
Status: DC | PRN
  Administered 2017-06-02: 1000 mL

## 2017-06-02 MED ORDER — KETOROLAC TROMETHAMINE 30 MG/ML IJ SOLN
30.0000 mg | Freq: Once | INTRAMUSCULAR | Status: AC
  Administered 2017-06-02: 30 mg via INTRAVENOUS

## 2017-06-02 MED ORDER — GABAPENTIN 300 MG PO CAPS
300.0000 mg | ORAL_CAPSULE | ORAL | Status: AC
  Administered 2017-06-02: 300 mg via ORAL
  Filled 2017-06-02: qty 1

## 2017-06-02 MED ORDER — FENTANYL CITRATE (PF) 100 MCG/2ML IJ SOLN
25.0000 ug | INTRAMUSCULAR | Status: DC | PRN
  Administered 2017-06-02 (×2): 50 ug via INTRAVENOUS

## 2017-06-02 MED ORDER — CEFAZOLIN SODIUM-DEXTROSE 2-4 GM/100ML-% IV SOLN
2.0000 g | INTRAVENOUS | Status: AC
  Administered 2017-06-02: 2 g via INTRAVENOUS

## 2017-06-02 MED ORDER — PHENYLEPHRINE 40 MCG/ML (10ML) SYRINGE FOR IV PUSH (FOR BLOOD PRESSURE SUPPORT)
PREFILLED_SYRINGE | INTRAVENOUS | Status: AC
  Filled 2017-06-02: qty 10

## 2017-06-02 MED ORDER — LIDOCAINE 2% (20 MG/ML) 5 ML SYRINGE
INTRAMUSCULAR | Status: DC | PRN
  Administered 2017-06-02: 1.5 mg/kg/h via INTRAVENOUS

## 2017-06-02 MED ORDER — 0.9 % SODIUM CHLORIDE (POUR BTL) OPTIME
TOPICAL | Status: DC | PRN
  Administered 2017-06-02: 1000 mL

## 2017-06-02 MED ORDER — HYDROMORPHONE HCL 1 MG/ML IJ SOLN
1.0000 mg | INTRAMUSCULAR | Status: DC | PRN
  Administered 2017-06-02 (×2): 1 mg via INTRAVENOUS
  Administered 2017-06-03: 2 mg via INTRAVENOUS
  Administered 2017-06-03: 1 mg via INTRAVENOUS
  Administered 2017-06-03: 2 mg via INTRAVENOUS
  Administered 2017-06-03 – 2017-06-04 (×4): 1 mg via INTRAVENOUS
  Administered 2017-06-05: 2 mg via INTRAVENOUS
  Filled 2017-06-02: qty 1
  Filled 2017-06-02: qty 2
  Filled 2017-06-02: qty 1
  Filled 2017-06-02: qty 2
  Filled 2017-06-02 (×4): qty 1
  Filled 2017-06-02: qty 2
  Filled 2017-06-02: qty 1

## 2017-06-02 MED ORDER — ENOXAPARIN SODIUM 40 MG/0.4ML ~~LOC~~ SOLN
40.0000 mg | SUBCUTANEOUS | Status: DC
  Administered 2017-06-02 – 2017-06-05 (×4): 40 mg via SUBCUTANEOUS
  Filled 2017-06-02 (×4): qty 0.4

## 2017-06-02 SURGICAL SUPPLY — 54 items
APPLIER CLIP 5 13 M/L LIGAMAX5 (MISCELLANEOUS)
APPLIER CLIP ROT 10 11.4 M/L (STAPLE)
BLADE SURG SZ11 CARB STEEL (BLADE) ×3 IMPLANT
CHLORAPREP W/TINT 26ML (MISCELLANEOUS) ×3 IMPLANT
CLIP APPLIE 5 13 M/L LIGAMAX5 (MISCELLANEOUS) IMPLANT
CLIP APPLIE ROT 10 11.4 M/L (STAPLE) IMPLANT
CLIP VESOLOCK LG 6/CT PURPLE (CLIP) IMPLANT
CLIP VESOLOCK MED LG 6/CT (CLIP) IMPLANT
COVER SURGICAL LIGHT HANDLE (MISCELLANEOUS) ×3 IMPLANT
COVER TIP SHEARS 8 DVNC (MISCELLANEOUS) IMPLANT
COVER TIP SHEARS 8MM DA VINCI (MISCELLANEOUS)
DECANTER SPIKE VIAL GLASS SM (MISCELLANEOUS) ×3 IMPLANT
DERMABOND ADVANCED (GAUZE/BANDAGES/DRESSINGS) ×2
DERMABOND ADVANCED .7 DNX12 (GAUZE/BANDAGES/DRESSINGS) ×1 IMPLANT
DRAIN PENROSE 18X1/2 LTX STRL (DRAIN) ×3 IMPLANT
DRAPE ARM DVNC X/XI (DISPOSABLE) ×5 IMPLANT
DRAPE COLUMN DVNC XI (DISPOSABLE) ×1 IMPLANT
DRAPE DA VINCI XI ARM (DISPOSABLE) ×10
DRAPE DA VINCI XI COLUMN (DISPOSABLE) ×2
ELECT REM PT RETURN 15FT ADLT (MISCELLANEOUS) ×3 IMPLANT
ENDOLOOP SUT PDS II  0 18 (SUTURE)
ENDOLOOP SUT PDS II 0 18 (SUTURE) IMPLANT
GAUZE SPONGE 4X4 16PLY XRAY LF (GAUZE/BANDAGES/DRESSINGS) ×3 IMPLANT
GLOVE BIO SURGEON STRL SZ7.5 (GLOVE) ×6 IMPLANT
GOWN STRL REUS W/TWL XL LVL3 (GOWN DISPOSABLE) ×15 IMPLANT
KIT BASIN OR (CUSTOM PROCEDURE TRAY) ×3 IMPLANT
MARKER SKIN DUAL TIP RULER LAB (MISCELLANEOUS) ×3 IMPLANT
MESH BIO-A 7X10 SYN MAT (Mesh General) ×3 IMPLANT
NEEDLE HYPO 22GX1.5 SAFETY (NEEDLE) ×3 IMPLANT
NEEDLE INSUFFLATION 14GA 120MM (NEEDLE) ×3 IMPLANT
OBTURATOR OPTICAL STANDARD 8MM (TROCAR)
OBTURATOR OPTICAL STND 8 DVNC (TROCAR)
OBTURATOR OPTICALSTD 8 DVNC (TROCAR) IMPLANT
PACK CARDIOVASCULAR III (CUSTOM PROCEDURE TRAY) ×3 IMPLANT
PAD POSITIONING PINK XL (MISCELLANEOUS) ×3 IMPLANT
SCISSORS LAP 5X35 DISP (ENDOMECHANICALS) ×3 IMPLANT
SEAL CANN UNIV 5-8 DVNC XI (MISCELLANEOUS) ×4 IMPLANT
SEAL XI 5MM-8MM UNIVERSAL (MISCELLANEOUS) ×8
SEALER VESSEL DA VINCI XI (MISCELLANEOUS) ×2
SEALER VESSEL EXT DVNC XI (MISCELLANEOUS) ×1 IMPLANT
SET IRRIG TUBING LAPAROSCOPIC (IRRIGATION / IRRIGATOR) ×3 IMPLANT
SOLUTION ANTI FOG 6CC (MISCELLANEOUS) ×3 IMPLANT
SOLUTION ELECTROLUBE (MISCELLANEOUS) ×3 IMPLANT
STAPLER VISISTAT 35W (STAPLE) IMPLANT
SUT ETHIBOND 0 36 GRN (SUTURE) ×6 IMPLANT
SUT MNCRL AB 4-0 PS2 18 (SUTURE) ×3 IMPLANT
SUT SILK 0 SH 30 (SUTURE) ×12 IMPLANT
SUT SILK 2 0 SH (SUTURE) ×6 IMPLANT
SYR 20CC LL (SYRINGE) ×3 IMPLANT
TOWEL OR 17X26 10 PK STRL BLUE (TOWEL DISPOSABLE) ×3 IMPLANT
TOWEL OR NON WOVEN STRL DISP B (DISPOSABLE) ×3 IMPLANT
TRAY FOLEY W/METER SILVER 16FR (SET/KITS/TRAYS/PACK) IMPLANT
TROCAR ADV FIXATION 5X100MM (TROCAR) ×3 IMPLANT
TUBING INSUFFLATION (TUBING) ×3 IMPLANT

## 2017-06-02 NOTE — Progress Notes (Signed)
MD paged due to patient having vagal response when moving to sit on the side of the bed. Vital signs stable and will await orders.

## 2017-06-02 NOTE — Progress Notes (Signed)
Nutrition Education Note  RD consulted for nutrition education regarding patient who is s/p nissen fundoplication.  RD provided handout on Nissen Fundoplication nutrition therapy. Reviewed clear and full liquids. Encouraged pt to avoid caffeine, carbonated beverages us of straws.  Provided examples of appropriate foods on a soft diet. Reviewed gas producing foods. Discouraged intake of processed foods and red meat. Recommended use of a liquid multivitamin while following a liquid diet. Reviewed acceptable protein supplements.  RD discussed why it is important for patient to adhere to diet recommendations. Teach back method used.  Expect great compliance.   Body mass index is 34.45 kg/m.  Pt meets criteria for obese based on current BMI.  Current diet order is NPO. Labs and medications reviewed. No further nutrition interventions warranted at this time. If additional nutrition issues arise, please re-consult RD.   Brandon Clark RD, LDN Clinical Nutrition Pager # [phone]- [phone]

## 2017-06-02 NOTE — H&P (Signed)
History of Present Illness The patient is a 76 year old male who presents for evaluation of reflux esophagitis. Patient comes back in today status post CT scan for evaluation of his hernia. Patient's son is the interpreter. I have reviewed these results personally. Patient states he's continued with reflux type symptoms. There is been no other change in his reflux symptoms since his last visit 2 weeks ago.  Patient was to be scheduled for a manometry test. This is currently still pending. We will have him stop by [facility] to schedule manometry testing.   Allergies No Known Drug Allergies 03/15/2017 Allergies Reconciled   Medication History Dexilant (60MG  Capsule DR, Oral) Active. HydroCHLOROthiazide (12.5MG  Capsule, Oral) Active. Lactulose (10GM/15ML Solution, Oral) Active. Meclizine HCl (25MG  Tablet, Oral) Active. Meloxicam (7.5MG  Tablet, Oral) Active. Simvastatin (10MG  Tablet, Oral) Active. Tamsulosin HCl (0.4MG  Capsule, Oral) Active. Zanaflex (4MG  Tablet, Oral) Active. TraMADol HCl (50MG  Tablet, Oral) Active. Medications Reconciled    Review of Systems General Present- Appetite Loss and Night Sweats. Not Present- Chills, Fatigue, Fever, Weight Gain and Weight Loss. Skin Not Present- Change in Wart/Mole, Dryness, Hives, Jaundice, New Lesions, Non-Healing Wounds, Rash and Ulcer. HEENT Present- Earache, Ringing in the Ears, Sore Throat and Wears glasses/contact lenses. Not Present- Hearing Loss, Hoarseness, Nose Bleed, Oral Ulcers, Seasonal Allergies, Sinus Pain, Visual Disturbances and Yellow Eyes. Respiratory Present- Snoring. Not Present- Bloody sputum, Chronic Cough, Difficulty Breathing and Wheezing. Breast Present- Breast Mass and Breast Pain. Not Present- Nipple Discharge and Skin Changes. Cardiovascular Present- Difficulty Breathing Lying Down, Leg Cramps, Rapid Heart Rate, Shortness of Breath and Swelling of Extremities. Not Present- Chest Pain and  Palpitations. Gastrointestinal Present- Abdominal Pain, Bloating, Bloody Stool, Constipation, Difficulty Swallowing, Excessive gas, Gets full quickly at meals, Nausea and Vomiting. Not Present- Change in Bowel Habits, Chronic diarrhea, Hemorrhoids, Indigestion and Rectal Pain. Male Genitourinary Present- Change in Urinary Stream, Painful Urination and Urine Leakage. Not Present- Blood in Urine, Frequency, Impotence, Nocturia and Urgency. Musculoskeletal Present- Back Pain, Joint Pain, Joint Stiffness, Muscle Pain, Muscle Weakness and Swelling of Extremities. Neurological Present- Decreased Memory, Fainting, Headaches, Numbness, Tingling, Trouble walking and Weakness. Not Present- Seizures and Tremor. Psychiatric Present- Anxiety and Frequent crying. Not Present- Bipolar, Change in Sleep Pattern, Depression and Fearful. Endocrine Present- Hair Changes and Heat Intolerance. Not Present- Cold Intolerance, Excessive Hunger and New Diabetes.      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 we will proceed with robotic hiatal hernia repair and Nissen fundoplication. 2. I discussed the risks and benefits of the procedure to include but not limited to: Infection, bleeding, damages running structures, possible pneumothorax,  possible recurrence. The patient voiced understanding and wishes to proceed. UMBILICAL HERNIA WITHOUT OBSTRUCTION OR GANGRENE (K42.9) Impression: This can likely be repaired at the same time as the laparoscopic approach to his hiatal hernia DIASTASIS RECTI (M62.08) Impression: No  treatment necessary and I discussed this with him

## 2017-06-02 NOTE — Transfer of Care (Signed)
Immediate Anesthesia Transfer of Care Note  Patient: Brandon Clark  Procedure(s) Performed: XI ROBOTIC ASSISTED HIATAL HERNIA REPAIR WITH MESH  AND NISSEN FUNDOPLICATION (N/A ) INSERTION OF MESH (N/A )  Patient Location: PACU  Anesthesia Type:General  Level of Consciousness: sedated  Airway & Oxygen Therapy: Patient Spontanous Breathing and Patient connected to face mask oxygen  Post-op Assessment: Report given to RN and Post -op Vital signs reviewed and stable  Post vital signs: Reviewed and stable  Last Vitals:  Vitals:   06/02/17 0528 06/02/17 1023  BP: (!) 142/96   Pulse: 88 92  Resp: 18 (!) 27  Temp: 36.5 C   SpO2: 96% 95%    Last Pain:  Vitals:   06/02/17 0528  TempSrc: Oral      Patients Stated Pain Goal: 4 (06/02/17 0544)  Complications: No apparent anesthesia complications

## 2017-06-02 NOTE — Op Note (Signed)
06/02/2017  10:03 AM  PATIENT:  Brandon Clark  76 y.o. male  PRE-OPERATIVE DIAGNOSIS:  HIATAL HERNIA   POST-OPERATIVE DIAGNOSIS:  HIATAL HERNIA   PROCEDURE:  Procedure(s): XI ROBOTIC ASSISTED HIATAL HERNIA REPAIR WITH MESH  AND NISSEN FUNDOPLICATION (N/A) INSERTION OF MESH (N/A)  SURGEON:  Surgeon(s) and Role:    * Axel Filleramirez, Elyna Pangilinan, MD - Primary    Karie Soda* Gross, Steven, MD - Assisting  ANESTHESIA:   local and general  EBL:  50cc   BLOOD ADMINISTERED:none  DRAINS: none   LOCAL MEDICATIONS USED:  BUPIVICAINE   SPECIMEN:  No Specimen  DISPOSITION OF SPECIMEN:  N/A  COUNTS:  YES  TOURNIQUET:  * No tourniquets in log *  DICTATION: .Dragon Dictation  The patient was taken back to the operating room and placed in the supine position with bilateral SCDs in place. The patient was prepped and draped in the usual sterile fashion. After appropriate antibiotics were confirmed a timeout was called and all facts were verified.   A Veress needle technique was used to insufflate the abdomen to 15 mm of mercury the paramedian stab incision. Subsequent to this an 8 mm trocar was introduced as was a 8 millimeter camera. At this time the subsequent robotic trochars x3, were then placed adjacent to this trocar approximately 8-10 cm away. Each trocar was inserted under direct visualization, there were total of 4 trochars. The assistant trocar was then placed in the right lower quadrant under direct visualization. The Nathanson retractor was then visualized inserted into the abdomen and the incision just to the left of the falciform ligament. This was then placed to retract the liver appropriately. At this time the patient was positioned in reverse Trendelenburg.   At this time the robot patient cart was brought to the bedside and placed in good position and the arms were docked to the trochars appropriately. At this time I proceeded to incised the gastrohepatic ligament.  At this time I  proceeded to mobilize the stomach inferiorly and visualize the right crus. The peritoneum over the right crus was incised and right crus was identified. I proceeded to dissect this inferiorly until the left crus was seen joining the right crus. Once the right crus was adequately dissected we turned our to the left crus which was dissected away. This required traction of the stomach to the right side. Once this was visualized we then proceeded to circumferentially dissect the esophagus away from the surrounding tissue. At this time a Penrose drain was placed around the esophagus to help with retraction. At this time the phrenoesophageal fat pad was dissected away from the esophagus. There was a moderate-sized hiatal hernia seen. I mobilized the esophagus cephalad approximately 5-6 cm proximally, clearing away the surrounding tissue. The anterior hernia sac was dissected away from the stomach and esophagus.  At this time we turned our attention to the greater curvature the stomach and the omentum was mobilized using the robotic vessel sealer. This was taken up to the greater curvature to the hiatus. This mobilized the entire greater curvature to allow mobilization and the wrap. I then proceeded to bring the greater curvature the stomach posterior to the esophagus, and a shoeshine technique was used to evaluate the mobilization of the greater curvature. The anterior hernia sac was dissected off the stomach.  At this time I proceeded to close the hiatus using figure of 8 0 Ethibonds x 3. This brought together the hiatal closure without undue stricture to the esophagus.  A piece of Gore Bio A hiatal mesh was placed over the hiatal closure and sutured to the crus using 0 silk sutures x 5.  At this time the greater curvature was brought around the esophagus and sutured using 0 silk sutures interrupted fashion approximately 1 cm apart x3. The middle suture was sutured to the esophagus. A left collar stitch was then  used to gastropexy the stomach from the wrap to the diaphragm just lateral to the left crus as.  A second collar stitch was placed from the wrap to the right crus. The wrap lay at approximately 11:00 on its own with undue tension.  The wrap lay loose with no strangulation of the esophagus.  At this time the robot was undocked. The liver trocar was removed. At this time insufflation was evacuated. Skin was reapproximated for Monocryl subcuticular fashion. The skin was then dressed with Dermabond. The patient tolerated the procedure well and was taken to the recovery room in stable condition.    PLAN OF CARE: Admit for overnight observation  PATIENT DISPOSITION:  PACU - hemodynamically stable.   Delay start of Pharmacological VTE agent (>24hrs) due to surgical blood loss or risk of bleeding: no

## 2017-06-02 NOTE — Progress Notes (Signed)
Pt placed on bipap auto. Pt complaning of chest pain an EKG was done. RR 16, HR 89 and SATS on 2L 95%. Pt is stable. RT will continue to monitor

## 2017-06-02 NOTE — Anesthesia Procedure Notes (Signed)
Procedure Name: Intubation Date/Time: 06/02/2017 7:45 AM Performed by: Lind Covert, CRNA Pre-anesthesia Checklist: Patient identified, Emergency Drugs available, Suction available and Patient being monitored Patient Re-evaluated:Patient Re-evaluated prior to induction Oxygen Delivery Method: Circle system utilized Preoxygenation: Pre-oxygenation with 100% oxygen Induction Type: IV induction Ventilation: Mask ventilation without difficulty Laryngoscope Size: Mac and 4 Grade View: Grade I Tube type: Oral Tube size: 7.5 mm Number of attempts: 1 Airway Equipment and Method: Stylet Placement Confirmation: ETT inserted through vocal cords under direct vision,  breath sounds checked- equal and bilateral and positive ETCO2 Secured at: 23 cm Tube secured with: Tape Dental Injury: Teeth and Oropharynx as per pre-operative assessment

## 2017-06-02 NOTE — Anesthesia Preprocedure Evaluation (Signed)
Anesthesia Evaluation  Patient identified by MRN, date of birth, ID band Patient awake    Reviewed: Allergy & Precautions, NPO status , Patient's Chart, lab work & pertinent test results  History of Anesthesia Complications Negative for: history of anesthetic complications  Airway Mallampati: III  TM Distance: >3 FB Neck ROM: Full    Dental  (+) Teeth Intact   Pulmonary shortness of breath and with exertion, sleep apnea and Continuous Positive Airway Pressure Ventilation ,  Non compliant cpap   breath sounds clear to auscultation       Cardiovascular hypertension, Pt. on medications (-) angina(-) Past MI and (-) CHF + Valvular Problems/Murmurs  Rhythm:Regular     Neuro/Psych PSYCHIATRIC DISORDERS Anxiety  Neuromuscular disease    GI/Hepatic Neg liver ROS, hiatal hernia, GERD  ,  Endo/Other  Morbid obesity  Renal/GU Renal InsufficiencyRenal disease     Musculoskeletal   Abdominal   Peds  Hematology   Anesthesia Other Findings TR  Reproductive/Obstetrics                             Anesthesia Physical Anesthesia Plan  ASA: III  Anesthesia Plan: General   Post-op Pain Management:    Induction: Intravenous  PONV Risk Score and Plan: 2 and Ondansetron and Dexamethasone  Airway Management Planned: Oral ETT  Additional Equipment: None  Intra-op Plan:   Post-operative Plan: Extubation in OR  Informed Consent: I have reviewed the patients History and Physical, chart, labs and discussed the procedure including the risks, benefits and alternatives for the proposed anesthesia with the patient or authorized representative who has indicated his/her understanding and acceptance.   Dental advisory given  Plan Discussed with: CRNA and Surgeon  Anesthesia Plan Comments:         Anesthesia Quick Evaluation

## 2017-06-02 NOTE — Discharge Instructions (Signed)
EATING AFTER YOUR ESOPHAGEAL SURGERY °(Stomach Fundoplication, Hiatal Hernia repair, Achalasia surgery, etc) ° °###################################################################### ° °EAT °Start with a pureed / full liquid diet (see below) °Gradually transition to a high fiber diet with a fiber supplement over the next month after discharge.   ° °WALK °Walk an hour a day.  Control your pain to do that.   ° °CONTROL PAIN °Control pain so that you can walk, sleep, tolerate sneezing/coughing, go up/down stairs. ° °HAVE A BOWEL MOVEMENT DAILY °Keep your bowels regular to avoid problems.  OK to try a laxative to override constipation.  OK to use an antidairrheal to slow down diarrhea.  Call if not better after 2 tries ° °CALL IF YOU HAVE PROBLEMS/CONCERNS °Call if you are still struggling despite following these instructions. °Call if you have concerns not answered by these instructions ° °###################################################################### ° ° °After your esophageal surgery, expect some sticking with swallowing over the next 1-2 months.   ° °If food sticks when you eat, it is called "dysphagia".  This is due to swelling around your esophagus at the wrap & hiatal diaphragm repair.  It will gradually ease off over the next few months.  To help you through this temporary phase, we start you out on a pureed (blenderized) diet. ° °Your first meal in the hospital was thin liquids.  You should have been given a pureed diet by the time you left the hospital.  We ask patients to stay on a pureed diet for the first 2-3 weeks to avoid anything getting "stuck" near your recent surgery.  Don't be alarmed if your ability to swallow doesn't progress according to this plan.  Everyone is different and some diets can advance more or less quickly.   ° ° °Some BASIC RULES to follow are: °· Maintain an upright position whenever eating or drinking. °· Take small bites - just a teaspoon size bite at a time. °· Eat slowly.   It may also help to eat only one food at a time. °· Consider nibbling through smaller, more frequent meals & avoid the urge to eat BIG meals °· Do not push through feelings of fullness, nausea, or bloatedness °· Do not mix solid foods and liquids in the same mouthful °· Try not to "wash foods down" with large gulps of liquids. °· Avoid carbonated (bubbly/fizzy) drinks.   °· Avoid foods that make you feel gassy or bloated.  Start with bland foods first.  Wait on trying greasy, fried, or spicy meals until you are tolerating more bland solids well. °· Understand that it will be hard to burp and belch at first.  This gradually improves with time.  Expect to be more gassy/flatulent/bloated initially.  Walking will help your body manage it better. °· Consider using medications for bloating that contain simethicone such as  Maalox or Gas-X  °· Eat in a relaxed atmosphere & minimize distractions. °· Avoid talking while eating.   °· Do not use straws. °· Following each meal, sit in an upright position (90 degree angle) for 60 to 90 minutes.  Going for a short walk can help as well °· If food does stick, don't panic.  Try to relax and let the food pass on its own.  Sipping WARM LIQUID such as strong hot black tea can also help slide it down. ° ° °Be gradual in changes & use common sense: ° °-If you easily tolerating a certain "level" of foods, advance to the next level gradually °-If you are   having trouble swallowing a particular food, then avoid it.   °-If food is sticking when you advance your diet, go back to thinner previous diet (the lower LEVEL) for 1-2 days. ° °LEVEL 1 = PUREED DIET ° °Do for the first 2 WEEKS AFTER SURGERY ° °-Foods in this group are pureed or blenderized to a smooth, mashed potato-like consistency.  °-If necessary, the pureed foods can keep their shape with the addition of a thickening agent.   °-Meat should be pureed to a smooth, pasty consistency.  Hot broth or gravy may be added to the pureed  meat, approximately 1 oz. of liquid per 3 oz. serving of meat. °-CAUTION:  If any foods do not puree into a smooth consistency, swallowing will be more difficult.  (For example, nuts or seeds sometimes do not blend well.) ° °Hot Foods Cold Foods  °Pureed scrambled eggs and cheese Pureed cottage cheese  °Baby cereals Thickened juices and nectars  °Thinned cooked cereals (no lumps) Thickened milk or eggnog  °Pureed French toast or pancakes Ensure  °Mashed potatoes Ice cream  °Pureed parsley, au gratin, scalloped potatoes, candied sweet potatoes Fruit or Italian ice, sherbet  °Pureed buttered or alfredo noodles Plain yogurt  °Pureed vegetables (no corn or peas) Instant breakfast  °Pureed soups and creamed soups Smooth pudding, mousse, custard  °Pureed scalloped apples Whipped gelatin  °Gravies Sugar, syrup, honey, jelly  °Sauces, cheese, tomato, barbecue, white, creamed Cream  °Any baby food Creamer  °Alcohol in moderation (not beer or champagne) Margarine  °Coffee or tea Mayonnaise  ° Ketchup, mustard  ° Apple sauce  ° °SAMPLE MENU:  PUREED DIET °Breakfast Lunch Dinner  °· Orange juice, 1/2 cup °· Cream of wheat, 1/2 cup · Pineapple juice, 1/2 cup · Pureed turkey, barley soup, 3/4 cup °· Pureed Hawaiian chicken, 3 oz  °· Scrambled eggs, mashed or blended with cheese, 1/2 cup °· Tea or coffee, 1 cup  °· Whole milk, 1 cup  °· Non-dairy creamer, 2 Tbsp. · Mashed potatoes, 1/2 cup °· Pureed cooled broccoli, 1/2 cup °· Apple sauce, 1/2 cup °· Coffee or tea · Mashed potatoes, 1/2 cup °· Pureed spinach, 1/2 cup °· Frozen yogurt, 1/2 cup °· Tea or coffee  ° ° ° ° °LEVEL 2 = SOFT DIET ° °After your first 2 weeks, you can advance to a soft diet.   °Keep on this diet until everything goes down easily. ° °Hot Foods Cold Foods  °White fish Cottage cheese  °Stuffed fish Junior baby fruit  °Baby food meals Semi thickened juices  °Minced soft cooked, scrambled, poached eggs nectars  °Souffle & omelets Ripe mashed bananas  °Cooked  cereals Canned fruit, pineapple sauce, milk  °potatoes Milkshake  °Buttered or Alfredo noodles Custard  °Cooked cooled vegetable Puddings, including tapioca  °Sherbet Yogurt  °Vegetable soup or alphabet soup Fruit ice, Italian ice  °Gravies Whipped gelatin  °Sugar, syrup, honey, jelly Junior baby desserts  °Sauces:  Cheese, creamed, barbecue, tomato, white Cream  °Coffee or tea Margarine  ° °SAMPLE MENU:  LEVEL 2 °Breakfast Lunch Dinner  °· Orange juice, 1/2 cup °· Oatmeal, 1/2 cup °· Scrambled eggs with cheese, 1/2 cup °· Decaffeinated tea, 1 cup °· Whole milk, 1 cup °· Non-dairy creamer, 2 Tbsp · Pineapple juice, 1/2 cup °· Minced beef, 3 oz °· Gravy, 2 Tbsp °· Mashed potatoes, 1/2 cup °· Minced fresh broccoli, 1/2 cup °· Applesauce, 1/2 cup °· Coffee, 1 cup · Turkey, barley soup, 3/4 cup °·   Minced Hawaiian chicken, 3 oz °· Mashed potatoes, 1/2 cup °· Cooked spinach, 1/2 cup °· Frozen yogurt, 1/2 cup °· Non-dairy creamer, 2 Tbsp  ° ° ° ° °LEVEL 3 = CHOPPED DIET ° °-After all the foods in level 2 (soft diet) are passing through well you should advance up to more chopped foods.  °-It is still important to cut these foods into small pieces and eat slowly. ° °Hot Foods Cold Foods  °Poultry Cottage cheese  °Chopped Swedish meatballs Yogurt  °Meat salads (ground or flaked meat) Milk  °Flaked fish (tuna) Milkshakes  °Poached or scrambled eggs Soft, cold, dry cereal  °Souffles and omelets Fruit juices or nectars  °Cooked cereals Chopped canned fruit  °Chopped French toast or pancakes Canned fruit cocktail  °Noodles or pasta (no rice) Pudding, mousse, custard  °Cooked vegetables (no frozen peas, corn, or mixed vegetables) Green salad  °Canned small sweet peas Ice cream  °Creamed soup or vegetable soup Fruit ice, Italian ice  °Pureed vegetable soup or alphabet soup Non-dairy creamer  °Ground scalloped apples Margarine  °Gravies Mayonnaise  °Sauces:  Cheese, creamed, barbecue, tomato, white Ketchup  °Coffee or tea Mustard   ° °SAMPLE MENU:  LEVEL 3 °Breakfast Lunch Dinner  °· Orange juice, 1/2 cup °· Oatmeal, 1/2 cup °· Scrambled eggs with cheese, 1/2 cup °· Decaffeinated tea, 1 cup °· Whole milk, 1 cup °· Non-dairy creamer, 2 Tbsp °· Ketchup, 1 Tbsp °· Margarine, 1 tsp °· Salt, 1/4 tsp °· Sugar, 2 tsp · Pineapple juice, 1/2 cup °· Ground beef, 3 oz °· Gravy, 2 Tbsp °· Mashed potatoes, 1/2 cup °· Cooked spinach, 1/2 cup °· Applesauce, 1/2 cup °· Decaffeinated coffee °· Whole milk °· Non-dairy creamer, 2 Tbsp °· Margarine, 1 tsp °· Salt, 1/4 tsp · Pureed turkey, barley soup, 3/4 cup °· Barbecue chicken, 3 oz °· Mashed potatoes, 1/2 cup °· Ground fresh broccoli, 1/2 cup °· Frozen yogurt, 1/2 cup °· Decaffeinated tea, 1 cup °· Non-dairy creamer, 2 Tbsp °· Margarine, 1 tsp °· Salt, 1/4 tsp °· Sugar, 1 tsp  ° ° °LEVEL 4:  REGULAR FOODS ° °-Foods in this group are soft, moist, regularly textured foods.   °-This level includes meat and breads, which tend to be the hardest things to swallow.   °-Eat very slowly, chew well and continue to avoid carbonated drinks. °-most people are at this level in 4-6 weeks ° °Hot Foods Cold Foods  °Baked fish or skinned Soft cheeses - cottage cheese  °Souffles and omelets Cream cheese  °Eggs Yogurt  °Stuffed shells Milk  °Spaghetti with meat sauce Milkshakes  °Cooked cereal Cold dry cereals (no nuts, dried fruit, coconut)  °French toast or pancakes Crackers  °Buttered toast Fruit juices or nectars  °Noodles or pasta (no rice) Canned fruit  °Potatoes (all types) Ripe bananas  °Soft, cooked vegetables (no corn, lima, or baked beans) Peeled, ripe, fresh fruit  °Creamed soups or vegetable soup Cakes (no nuts, dried fruit, coconut)  °Canned chicken noodle soup Plain doughnuts  °Gravies Ice cream  °Bacon dressing Pudding, mousse, custard  °Sauces:  Cheese, creamed, barbecue, tomato, white Fruit ice, Italian ice, sherbet  °Decaffeinated tea or coffee Whipped gelatin  °Pork chops Regular gelatin  ° Canned fruited  gelatin molds  ° Sugar, syrup, honey, jam, jelly  ° Cream  ° Non-dairy  ° Margarine  ° Oil  ° Mayonnaise  ° Ketchup  ° Mustard  ° °TROUBLESHOOTING IRREGULAR BOWELS  °1) Avoid extremes of bowel   movements (no bad constipation/diarrhea)  °2) Miralax 17gm mixed in 8oz. water or juice-daily. May use BID as needed.  °3) Gas-x,Phazyme, etc. as needed for gas & bloating.  °4) Soft,bland diet. No spicy,greasy,fried foods.  °5) Prilosec over-the-counter as needed  °6) May hold gluten/wheat products from diet to see if symptoms improve.  °7) May try probiotics (Align, Activa, etc) to help calm the bowels down  °7) If symptoms become worse call back immediately. ° ° ° °If you have any questions please call our office at [facility]: [phone]. ° °

## 2017-06-02 NOTE — Progress Notes (Signed)
MD paged due to patient having chest pain after being transported by PACU. EKG read normal sinus rhythm and vital signs stable. Will continue to monitor and await orders.

## 2017-06-02 NOTE — Anesthesia Postprocedure Evaluation (Signed)
Anesthesia Post Note  Patient: Brandon LittenAbdelmonem Clark  Procedure(s) Performed: XI ROBOTIC ASSISTED HIATAL HERNIA REPAIR WITH MESH  AND NISSEN FUNDOPLICATION (N/A ) INSERTION OF MESH (N/A )     Patient location during evaluation: PACU Anesthesia Type: General Level of consciousness: awake and alert Pain management: pain level controlled Vital Signs Assessment: post-procedure vital signs reviewed and stable Respiratory status: spontaneous breathing, nonlabored ventilation, respiratory function stable and patient connected to nasal cannula oxygen Cardiovascular status: blood pressure returned to baseline and stable Postop Assessment: no apparent nausea or vomiting Anesthetic complications: no    Last Vitals:  Vitals:   06/02/17 1254 06/02/17 1359  BP: 120/85 125/86  Pulse: 86 85  Resp: 18 18  Temp: 36.4 C 36.5 C  SpO2: 98% 95%    Last Pain:  Vitals:   06/02/17 1359  TempSrc: Axillary  PainSc:                  [name]

## 2017-06-03 ENCOUNTER — Inpatient Hospital Stay (HOSPITAL_COMMUNITY): Payer: Medicaid Other

## 2017-06-03 LAB — BASIC METABOLIC PANEL
Anion gap: 5 (ref 5–15)
BUN: 19 mg/dL (ref 6–20)
CALCIUM: 8.3 mg/dL — AB (ref 8.9–10.3)
CO2: 26 mmol/L (ref 22–32)
CREATININE: 0.89 mg/dL (ref 0.61–1.24)
Chloride: 106 mmol/L (ref 101–111)
GFR calc Af Amer: >60 mL/min (ref >60)
GLUCOSE: 136 mg/dL — AB (ref 65–99)
Potassium: 3.8 mmol/L (ref 3.5–5.1)
Sodium: 137 mmol/L (ref 135–145)

## 2017-06-03 MED ORDER — IOPAMIDOL (ISOVUE-300) INJECTION 61%
INTRAVENOUS | Status: AC
  Administered 2017-06-03: 09:00:00 via ORAL
  Filled 2017-06-03: qty 150

## 2017-06-03 NOTE — Progress Notes (Signed)
Pt assisted up to BR, voided large amount, pt unsteady on feet. Pt returned to bed with assist of two health care providers, pt's spins right hand with index finger going in circles to indicated that his head feels like it is spinning. VSS. CPAP intact while sleeping.

## 2017-06-03 NOTE — Progress Notes (Signed)
Patient ID: Brandon Clark, male   DOB: 1941/04/16, 76 y.o.   MRN: [id number]  [facility] Progress Note:   1 Day Post-Op  Subjective: Mental status is clear;  Speaks Arabic,  Having some gas discomfort in chest Objective: Vital signs in last 24 hours: Temp:  [97.6 F (36.4 C)-98 F (36.7 C)] 97.7 F (36.5 C) (12/01 0640) Pulse Rate:  [85-98] 95 (12/01 0640) Resp:  [16-27] 18 (12/01 0640) BP: (120-158)/(82-116) 120/84 (12/01 0640) SpO2:  [91 %-98 %] 95 % (12/01 0640)  Intake/Output from previous day: 11/30 0701 - 12/01 0700 In: 2173.3 [I.V.:2173.3] Out: 130 [Urine:100; Blood:30] Intake/Output this shift: No intake/output data recorded.  Physical Exam: Work of breathing is not labored;  Will try clear liquids.    Lab Results:  Results for orders placed or performed during the hospital encounter of 06/02/17 (from the past 48 hour(s))  Basic metabolic panel     Status: Abnormal   Collection Time: 06/03/17  5:07 AM  Result Value Ref Range   Sodium 137 135 - 145 mmol/L   Potassium 3.8 3.5 - 5.1 mmol/L   Chloride 106 101 - 111 mmol/L   CO2 26 22 - 32 mmol/L   Glucose, Bld 136 (H) 65 - 99 mg/dL   BUN 19 6 - 20 mg/dL   Creatinine, Ser 0.89 0.61 - 1.24 mg/dL   Calcium 8.3 (L) 8.9 - 10.3 mg/dL   GFR calc non Af Amer >60 >60 mL/min   GFR calc Af Amer >60 >60 mL/min    Comment: (NOTE) The eGFR has been calculated using the CKD EPI equation. This calculation has not been validated in all clinical situations. eGFR's persistently <60 mL/min signify possible Chronic Kidney Disease.    Anion gap 5 5 - 15    Radiology/Results: No results found.  Anti-infectives: Anti-infectives (From admission, onward)   Start     Dose/Rate Route Frequency Ordered Stop   06/02/17 0638  ceFAZolin (ANCEF) 2-4 GM/100ML-% IVPB    Comments:  Harle Stanford   : cabinet override      06/02/17 3729 06/02/17 0746   06/02/17 0526  ceFAZolin (ANCEF) IVPB 2g/100 mL premix     2 g 200  mL/hr over 30 Minutes Intravenous On call to O.R. 06/02/17 0526 06/02/17 0816      Assessment/Plan: Problem List: Patient Active Problem List   Diagnosis Date Noted  . S/P Nissen fundoplication (without gastrostomy tube) procedure 06/02/2017  . Dementia 05/12/2017  . Prediabetes 01/30/2017  . Hiatal hernia 01/27/2017  . Acid reflux 02/12/2016  . Sleep apnea 02/12/2016  . Diastasis recti 03/02/2015  . Umbilical hernia 08/14/1550  . BPV (benign positional vertigo) 07/01/2014  . HTN (hypertension) 07/01/2014  . Panic attack   . Encephalopathy, hypertensive   . Ataxia 06/30/2014  . Vertigo 06/30/2014  . Dyspepsia 04/03/2014  . Encounter for colorectal cancer screening 04/03/2014  . Bilateral shoulder pain 09/17/2013  . Bilateral knee pain 02/05/2013    Not ready for discharge.  UGI reviewed and emptys slowly but no leak seen by me.  Will offer clears with possible discharge Sunday or Monday.   1 Day Post-Op    LOS: 1 day   Matt B. Hassell Done, MD, [facility] [phone] beeper [phone]  06/03/2017 9:58 AM

## 2017-06-04 MED ORDER — ALUM & MAG HYDROXIDE-SIMETH 200-200-20 MG/5ML PO SUSP
30.0000 mL | ORAL | Status: DC | PRN
  Administered 2017-06-04: 30 mL via ORAL
  Filled 2017-06-04: qty 30

## 2017-06-04 MED ORDER — BISACODYL 10 MG RE SUPP
10.0000 mg | Freq: Once | RECTAL | Status: AC
  Administered 2017-06-04: 10 mg via RECTAL
  Filled 2017-06-04: qty 1

## 2017-06-04 MED ORDER — DEXLANSOPRAZOLE 60 MG PO CPDR
60.0000 mg | DELAYED_RELEASE_CAPSULE | Freq: Every day | ORAL | Status: DC
  Filled 2017-06-04: qty 1

## 2017-06-04 MED ORDER — PANTOPRAZOLE SODIUM 40 MG IV SOLR
40.0000 mg | Freq: Two times a day (BID) | INTRAVENOUS | Status: DC
  Administered 2017-06-04 – 2017-06-05 (×4): 40 mg via INTRAVENOUS
  Filled 2017-06-04 (×5): qty 40

## 2017-06-04 NOTE — Progress Notes (Signed)
Patient ID: Brandon Clark, male   DOB: Apr 11, 1941, 76 y.o.   MRN: [id number] [facility] Progress Note:   2 Days Post-Op  Subjective: Mental status is clear.  He is having sharp pains consistent with esophageal spasm.   Objective: Vital signs in last 24 hours: Temp:  [98.4 F (36.9 C)-99.3 F (37.4 C)] 99.3 F (37.4 C) (12/02 0557) Pulse Rate:  [95-101] 101 (12/02 0557) Resp:  [18] 18 (12/02 0557) BP: (125-171)/(85-98) 171/98 (12/02 0557) SpO2:  [90 %-98 %] 91 % (12/02 0557)  Intake/Output from previous day: 12/01 0701 - 12/02 0700 In: 960 [P.O.:960] Out: 0  Intake/Output this shift: No intake/output data recorded.  Physical Exam: Work of breathing is not labored.  Abdomen full.    Lab Results:  Results for orders placed or performed during the hospital encounter of 06/02/17 (from the past 48 hour(s))  Basic metabolic panel     Status: Abnormal   Collection Time: 06/03/17  5:07 AM  Result Value Ref Range   Sodium 137 135 - 145 mmol/L   Potassium 3.8 3.5 - 5.1 mmol/L   Chloride 106 101 - 111 mmol/L   CO2 26 22 - 32 mmol/L   Glucose, Bld 136 (H) 65 - 99 mg/dL   BUN 19 6 - 20 mg/dL   Creatinine, Ser 0.89 0.61 - 1.24 mg/dL   Calcium 8.3 (L) 8.9 - 10.3 mg/dL   GFR calc non Af Amer >60 >60 mL/min   GFR calc Af Amer >60 >60 mL/min    Comment: (NOTE) The eGFR has been calculated using the CKD EPI equation. This calculation has not been validated in all clinical situations. eGFR's persistently <60 mL/min signify possible Chronic Kidney Disease.    Anion gap 5 5 - 15    Radiology/Results: Dg Esophagus W/water Sol Cm  Result Date: 06/03/2017 CLINICAL DATA:  Hiatal hernia repair, Nissen fundoplication EXAM: ESOPHOGRAM/BARIUM SWALLOW TECHNIQUE: Single contrast examination was performed using water-soluble contrast. FLUOROSCOPY TIME:  Fluoroscopy Time:  1 minutes 18 seconds Radiation Exposure Index (if provided by the fluoroscopic device): 45 mGy COMPARISON:   04/05/2017 CT FINDINGS: Single contrast water soluble esophagram performed. Patient is status post Nissen fundoplication. There is stasis of contrast within the esophagus above the Nissen fundoplication/GE junction. With intermittent fluoroscopy there is slow passage of contrast through the Nissen repair into the stomach. No gross leakage of contrast or extravasation. IMPRESSION: Status post Nissen fundoplication with a degree of esophageal stasis above the repair. No gross leakage or extravasation. Contrast does eventually pass into the stomach. Electronically Signed   By: Jerilynn Mages.  Shick M.D.   On: 06/03/2017 10:02    Anti-infectives: Anti-infectives (From admission, onward)   Start     Dose/Rate Route Frequency Ordered Stop   06/02/17 0638  ceFAZolin (ANCEF) 2-4 GM/100ML-% IVPB    Comments:  Harle Stanford   : cabinet override      06/02/17 0973 06/02/17 0746   06/02/17 0526  ceFAZolin (ANCEF) IVPB 2g/100 mL premix     2 g 200 mL/hr over 30 Minutes Intravenous On call to O.R. 06/02/17 0526 06/02/17 0816      Assessment/Plan: Problem List: Patient Active Problem List   Diagnosis Date Noted  . S/P Nissen fundoplication (without gastrostomy tube) procedure 06/02/2017  . Dementia 05/12/2017  . Prediabetes 01/30/2017  . Hiatal hernia 01/27/2017  . Acid reflux 02/12/2016  . Sleep apnea 02/12/2016  . Diastasis recti 03/02/2015  . Umbilical hernia 53/29/9242  . BPV (benign positional vertigo) 07/01/2014  .  HTN (hypertension) 07/01/2014  . Panic attack   . Encephalopathy, hypertensive   . Ataxia 06/30/2014  . Vertigo 06/30/2014  . Dyspepsia 04/03/2014  . Encounter for colorectal cancer screening 04/03/2014  . Bilateral shoulder pain 09/17/2013  . Bilateral knee pain 02/05/2013    Still with some spasm of esophagus.  Will keep around today.  Give suppositiory has he hasn't had a BM and feels full.  2 Days Post-Op    LOS: 2 days   Matt B. Hassell Done, MD, [facility] [phone] beeper [phone]  06/04/2017 9:37 AM

## 2017-06-05 ENCOUNTER — Encounter (HOSPITAL_COMMUNITY): Payer: Self-pay | Admitting: General Surgery

## 2017-06-05 ENCOUNTER — Inpatient Hospital Stay (HOSPITAL_COMMUNITY): Payer: Medicaid Other

## 2017-06-05 LAB — CBC WITH DIFFERENTIAL/PLATELET
BASOS PCT: 0 %
Basophils Absolute: 0 10*3/uL (ref 0.0–0.1)
EOS ABS: 0.1 10*3/uL (ref 0.0–0.7)
Eosinophils Relative: 2 %
HCT: 41.2 % (ref 39.0–52.0)
Hemoglobin: 13.7 g/dL (ref 13.0–17.0)
LYMPHS ABS: 1.4 10*3/uL (ref 0.7–4.0)
Lymphocytes Relative: 25 %
MCH: 29.4 pg (ref 26.0–34.0)
MCHC: 33.3 g/dL (ref 30.0–36.0)
MCV: 88.4 fL (ref 78.0–100.0)
MONO ABS: 0.9 10*3/uL (ref 0.1–1.0)
MONOS PCT: 16 %
Neutro Abs: 3.3 10*3/uL (ref 1.7–7.7)
Neutrophils Relative %: 57 %
PLATELETS: 127 10*3/uL — AB (ref 150–400)
RBC: 4.66 MIL/uL (ref 4.22–5.81)
RDW: 13.8 % (ref 11.5–15.5)
WBC: 5.8 10*3/uL (ref 4.0–10.5)

## 2017-06-05 LAB — TROPONIN I: Troponin I: <0.03 ng/mL (ref <0.03)

## 2017-06-05 MED ORDER — HYDRALAZINE HCL 20 MG/ML IJ SOLN
20.0000 mg | INTRAMUSCULAR | Status: DC | PRN
  Filled 2017-06-05: qty 1

## 2017-06-05 MED ORDER — HYDROCODONE-ACETAMINOPHEN 7.5-325 MG/15ML PO SOLN: 15.0000 mL | Freq: Four times a day (QID) | ORAL | 0 refills | Status: DC | PRN

## 2017-06-05 MED ORDER — ACETAMINOPHEN 160 MG/5ML PO SOLN
975.0000 mg | Freq: Once | ORAL | Status: AC
  Administered 2017-06-05: 975 mg via ORAL
  Filled 2017-06-05: qty 40.6

## 2017-06-05 MED ORDER — OXYCODONE HCL 5 MG PO TABS
5.0000 mg | ORAL_TABLET | ORAL | Status: DC | PRN
  Administered 2017-06-06: 5 mg via ORAL
  Filled 2017-06-05: qty 1

## 2017-06-05 MED ORDER — ACETAMINOPHEN 160 MG/5ML PO SOLN
975.0000 mg | Freq: Four times a day (QID) | ORAL | Status: DC | PRN
  Administered 2017-06-05: 975 mg via ORAL
  Filled 2017-06-05: qty 40.6

## 2017-06-05 MED ORDER — HYDROCHLOROTHIAZIDE 12.5 MG PO CAPS
12.5000 mg | ORAL_CAPSULE | Freq: Once | ORAL | Status: AC
  Administered 2017-06-05: 12.5 mg via ORAL
  Filled 2017-06-05: qty 1

## 2017-06-05 NOTE — Discharge Summary (Signed)
Physician Discharge Summary  Patient ID: Brandon Clark MRN: [id number] DOB/AGE: 03/29/1941 76 y.o.  Admit date: 06/02/2017 Discharge date: 06/05/2017  Admission Diagnoses: Hiatal hernia  Discharge Diagnoses:  Active Problems:   S/P Nissen fundoplication (without gastrostomy tube) procedure   Discharged Condition: good  Hospital Course: Patient is a 76 year old male status post robotic Nissen fundoplication and hiatal hernia repair with mesh.  Postoperatively patient was sent to the floor.  He underwent esophagram on postop day 1.  This was a relative for no injury.  Patient was started on a liquid diet thereafter.  Patient was otherwise ambulating well on his own and had good pain control.  Postop day 3 patient did have some chest pain/tightness.  Patient underwent EKG/troponin/chest x-ray.  These were all normal.  These appear to be related to reflux.  He was started on Protonix IV.  This was likely due to esophageal edema.  Patient was tolerating liquid diet well.  He was otherwise afebrile, normal white count, deemed stable for discharge and discharged home.  Consults: None  Significant Diagnostic Studies: esophagogram with no leaks  Treatments: surgery: as above  Discharge Exam: Blood pressure 123/62, pulse 91, temperature 97.7 F (36.5 C), temperature source Oral, resp. rate 18, height 6' (1.829 m), weight 115.2 kg (254 lb), SpO2 92 %. General appearance: alert and cooperative Cardio: regular rate and rhythm, S1, S2 normal, no murmur, click, rub or gallop GI: soft, non-tender; bowel sounds normal; no masses,  no organomegaly  Disposition: 01-Home or Self Care  Discharge Instructions    Diet - low sodium heart healthy   Complete by:  As directed    Increase activity slowly   Complete by:  As directed             Current Facility-Administered Medications (Analgesics):  .  HYDROmorphone (DILAUDID) injection 1-2 mg  Current Outpatient Medications  (Analgesics):  .  HYDROcodone-acetaminophen (HYCET) 7.5-325 mg/15 ml solution, Take 15 mLs by mouth 4 (four) times daily as needed for moderate pain.  Current Facility-Administered Medications (Hematological):  .  enoxaparin (LOVENOX) injection 40 mg   Current Facility-Administered Medications (Other):  .  alum & mag hydroxide-simeth (MAALOX/MYLANTA) 200-200-20 MG/5ML suspension 30 mL .  dextrose 5 %-0.9 % sodium chloride infusion .  ondansetron (ZOFRAN-ODT) disintegrating tablet 4 mg **OR** ondansetron (ZOFRAN) injection 4 mg .  pantoprazole (PROTONIX) injection 40 mg   . Follow-up Information    Axel Filleramirez, Mayo Owczarzak, MD. Schedule an appointment as soon as possible for a visit in 2 week(s).   Specialty:  General Surgery Why:  For wound re-check Contact information: [address] [place] [address] [phone]           Signed: Marigene EhlersRamirez Jr., Jed LimerickArmando 06/05/2017, 7:30 AM

## 2017-06-05 NOTE — Progress Notes (Signed)
Pt reporting dizziness, instability, and headache and belly pain. Paged Dr. Donell BeersByerly.  Dr. Donell BeersByerly returned page. See MAR for orders given. Will continue to monitor pt and recheck BP once medicine has had time to work.

## 2017-06-05 NOTE — Progress Notes (Signed)
Vitals retaken and BP still elevated. Pt reporting chest pressure, stabbing feeling in chest and SOB. Paged Dr. Donell BeersByerly. Orders placed. See MAR. Will follow orders and continue to monitor pt.

## 2017-06-05 NOTE — Progress Notes (Signed)
Pt called RT to room, and despite limited english was able to communicate that he is unable to tolerate the CPAP mask and would like to just wear nasal cannula tonight while sleeping.  Pt placed on 2 Lpm nasal cannula.  CPAP will remain in room in case Pt requires CPAP at later time.

## 2017-06-06 MED ORDER — ACETAMINOPHEN 160 MG/5ML PO SOLN
650.0000 mg | ORAL | Status: DC | PRN
  Administered 2017-06-06: 650 mg via ORAL
  Filled 2017-06-06: qty 20.3

## 2017-06-06 NOTE — Progress Notes (Signed)
Assessment unchanged. Pt and son verbalized understanding of dc instructions through teach back regarding diet regimen to follow, medications to resume, follow up care, as well as when to call doctor if any problems arise. [employer] video interpreter, Aseel [id number] utilized during discharge. Script x 1 given as provided by MD. Obtained order from Dr. Andrey CampanileWilson prior to dc for abd binder per pt request. Discharged via wc to front entrance accompanied by son and NT.

## 2017-06-06 NOTE — Progress Notes (Signed)
Dr. Derrell Lollingamirez aware via phone pt lost IV access. See new order to dc IV protonix. MD plans to dc pt home.

## 2017-06-06 NOTE — Evaluation (Signed)
Physical Therapy Evaluation Patient Details Name: Brandon Clark MRN: [id number] DOB: [birth year] Today's Date: 06/06/2017   History of Present Illness  Patient is a 76 y/o male admitted with HH now s/p XI ROBOTIC ASSISTED HIATAL HERNIA REPAIR WITH MESH  AND NISSEN FUNDOPLICATION.  PMH positive for dementia, sleep apnea, reflux, vertigo and HTN.  Clinical Impression  Patient presents with decreased independence and safety with mobility due to LE weakness and abdominal pain and deconditioning.  Feel he can benefit from skilled PT in the acute setting and if qualifies for charity care can progress better with HHPT.  Will need walker and 3:1 for home.     Follow Up Recommendations Home health PT    Equipment Recommendations  Rolling walker with 5" wheels;3in1 (PT)    Recommendations for Other Services       Precautions / Restrictions Precautions Precautions: Fall      Mobility  Bed Mobility               General bed mobility comments: up in recliner  Transfers Overall transfer level: Needs assistance Equipment used: Rolling walker (2 wheeled) Transfers: Sit to/from Stand Sit to Stand: Min guard         General transfer comment: some trouble coming up from recliner, minguard and multiple attempts for success pushing up on arms on chair  Ambulation/Gait Ambulation/Gait assistance: Min assist;Min guard Ambulation Distance (Feet): 100 Feet Assistive device: Rolling walker (2 wheeled) Gait Pattern/deviations: Shuffle;Step-to pattern;Step-through pattern;Antalgic;Wide base of support;Decreased stance time - left;Trunk flexed     General Gait Details: slow pace, hesitant and needing min A for safety and balance,  Stairs            Wheelchair Mobility    Modified Rankin (Stroke Patients Only)       Balance Overall balance assessment: Needs assistance   Sitting balance-Leahy Scale: Good     Standing balance support: No upper extremity  supported Standing balance-Leahy Scale: Fair Standing balance comment: initially static balance without UE support, with ambulation needed walker for safety/security and balance                             Pertinent Vitals/Pain Pain Assessment: 0-10 Pain Score: 6  Pain Location: abdomen Pain Descriptors / Indicators: Operative site guarding;Sore Pain Intervention(s): Monitored during session;Patient requesting pain meds-RN notified;RN gave pain meds during session    Home Living Family/patient expects to be discharged to:: Private residence Living Arrangements: Children Available Help at Discharge: Family;Available PRN/intermittently Type of Home: House Home Access: Stairs to enter Entrance Stairs-Rails: None Entrance Stairs-Number of Steps: couple Home Layout: One level Home Equipment: None      Prior Function Level of Independence: Independent               Hand Dominance        Extremity/Trunk Assessment   Upper Extremity Assessment Upper Extremity Assessment: LUE deficits/detail;RUE deficits/detail RUE Deficits / Details: AROM WFL, strength shoulder flex 4/5, elbow flex 3+/5, extension 3+/5, grip weak LUE Deficits / Details: AROM WFL, strength shoulder flex 4/5, elbow flex 3+/5, extension 3+/5, grip weak; R forearm painful to touch and reports both hands with numbness and tingling    Lower Extremity Assessment Lower Extremity Assessment: LLE deficits/detail;RLE deficits/detail RLE Deficits / Details: AAROM WFL, strength limited by abdominal pain esp hip flexion otherwise grossly 4-/5 LLE Deficits / Details: AAROM WFL, strength limited by abdominal pain esp hip flexion otherwise grossly  4-/5       Communication   Communication: Prefers language other than [place];Interpreter utilized(speaks Arabic, used [employer] video interpreter)  Cognition Arousal/Alertness: Awake/alert Behavior During Therapy: WFL for tasks assessed/performed Overall Cognitive  Status: Within Functional Limits for tasks assessed                                        General Comments General comments (skin integrity, edema, etc.): BP 133/104 initially, 147/107 after ambulation; pt c/o dizziness and weakness,.  RN aware    Exercises     Assessment/Plan    PT Assessment Patient needs continued PT services  PT Problem List Decreased strength;Decreased balance;Pain;Decreased knowledge of use of DME;Decreased activity tolerance;Decreased mobility;Decreased safety awareness       PT Treatment Interventions DME instruction;Therapeutic activities;Therapeutic exercise;Patient/family education;Gait training;Balance training;Stair training;Functional mobility training    PT Goals (Current goals can be found in the Care Plan section)  Acute Rehab PT Goals Patient Stated Goal: To get stronger and go home PT Goal Formulation: With patient Time For Goal Achievement: 06/14/17 Potential to Achieve Goals: Good    Frequency Min 3X/week   Barriers to discharge        Co-evaluation               AM-PAC PT "6 Clicks" Daily Activity  Outcome Measure Difficulty turning over in bed (including adjusting bedclothes, sheets and blankets)?: A Little Difficulty moving from lying on back to sitting on the side of the bed? : A Lot Difficulty sitting down on and standing up from a chair with arms (e.g., wheelchair, bedside commode, etc,.)?: Unable Help needed moving to and from a bed to chair (including a wheelchair)?: A Little Help needed walking in hospital room?: A Little Help needed climbing 3-5 steps with a railing? : A Little 6 Click Score: 15    End of Session Equipment Utilized During Treatment: Gait belt Activity Tolerance: Patient limited by fatigue Patient left: in chair;with call bell/phone within reach   PT Visit Diagnosis: Other abnormalities of gait and mobility (R26.89);History of falling (Z91.81);Other symptoms and signs involving the  nervous system (R29.898)    Time: 0930-1000 PT Time Calculation (min) (ACUTE ONLY): 30 min   Charges:   PT Evaluation $PT Eval Moderate Complexity: 1 Mod PT Treatments $Gait Training: 8-22 mins   PT G CodesSheran Lawless:        Cyndi Wynn, [place]PT [phone] 06/06/2017   Elray Mcgregorynthia Wynn 06/06/2017, 11:05 AM

## 2017-06-09 ENCOUNTER — Emergency Department (HOSPITAL_BASED_OUTPATIENT_CLINIC_OR_DEPARTMENT_OTHER)
Admit: 2017-06-09 | Discharge: 2017-06-09 | Disposition: A | Discharge status: Home / Self Care | Payer: Medicaid Other | Attending: Emergency Medicine | Admitting: Emergency Medicine

## 2017-06-09 ENCOUNTER — Emergency Department (HOSPITAL_COMMUNITY)
Admission: EM | Admit: 2017-06-09 | Discharge: 2017-06-09 | Disposition: A | Discharge status: Home / Self Care | Payer: Medicaid Other | Attending: Emergency Medicine | Admitting: Emergency Medicine

## 2017-06-09 ENCOUNTER — Encounter (HOSPITAL_COMMUNITY): Payer: Self-pay

## 2017-06-09 ENCOUNTER — Other Ambulatory Visit: Payer: Self-pay

## 2017-06-09 DIAGNOSIS — M7989 Other specified soft tissue disorders: Secondary | ICD-10-CM | POA: Diagnosis not present

## 2017-06-09 DIAGNOSIS — I10 Essential (primary) hypertension: Secondary | ICD-10-CM | POA: Diagnosis not present

## 2017-06-09 DIAGNOSIS — M79605 Pain in left leg: Secondary | ICD-10-CM | POA: Insufficient documentation

## 2017-06-09 DIAGNOSIS — Z7982 Long term (current) use of aspirin: Secondary | ICD-10-CM | POA: Diagnosis not present

## 2017-06-09 DIAGNOSIS — I82812 Embolism and thrombosis of superficial veins of left lower extremities: Secondary | ICD-10-CM | POA: Diagnosis not present

## 2017-06-09 DIAGNOSIS — R109 Unspecified abdominal pain: Secondary | ICD-10-CM | POA: Insufficient documentation

## 2017-06-09 DIAGNOSIS — R195 Other fecal abnormalities: Secondary | ICD-10-CM | POA: Diagnosis not present

## 2017-06-09 DIAGNOSIS — R319 Hematuria, unspecified: Secondary | ICD-10-CM | POA: Insufficient documentation

## 2017-06-09 DIAGNOSIS — R197 Diarrhea, unspecified: Secondary | ICD-10-CM | POA: Diagnosis not present

## 2017-06-09 DIAGNOSIS — M79609 Pain in unspecified limb: Secondary | ICD-10-CM | POA: Diagnosis not present

## 2017-06-09 DIAGNOSIS — R2242 Localized swelling, mass and lump, left lower limb: Secondary | ICD-10-CM | POA: Diagnosis present

## 2017-06-09 DIAGNOSIS — Z79899 Other long term (current) drug therapy: Secondary | ICD-10-CM | POA: Diagnosis not present

## 2017-06-09 LAB — COMPREHENSIVE METABOLIC PANEL
ALBUMIN: 3.5 g/dL (ref 3.5–5.0)
ALK PHOS: 92 U/L (ref 38–126)
ALT: 105 U/L — ABNORMAL HIGH (ref 17–63)
ANION GAP: 9 (ref 5–15)
AST: 34 U/L (ref 15–41)
BILIRUBIN TOTAL: 2.2 mg/dL — AB (ref 0.3–1.2)
BUN: 16 mg/dL (ref 6–20)
CALCIUM: 8.7 mg/dL — AB (ref 8.9–10.3)
CO2: 28 mmol/L (ref 22–32)
Chloride: 97 mmol/L — ABNORMAL LOW (ref 101–111)
Creatinine, Ser: 1.16 mg/dL (ref 0.61–1.24)
GFR calc Af Amer: >60 mL/min (ref >60)
GLUCOSE: 114 mg/dL — AB (ref 65–99)
Potassium: 4 mmol/L (ref 3.5–5.1)
Sodium: 134 mmol/L — ABNORMAL LOW (ref 135–145)
TOTAL PROTEIN: 6.9 g/dL (ref 6.5–8.1)

## 2017-06-09 LAB — CBC WITH DIFFERENTIAL/PLATELET
BASOS ABS: 0.1 10*3/uL (ref 0.0–0.1)
BASOS PCT: 1 %
EOS PCT: 2 %
Eosinophils Absolute: 0.2 10*3/uL (ref 0.0–0.7)
HCT: 44.7 % (ref 39.0–52.0)
Hemoglobin: 15.5 g/dL (ref 13.0–17.0)
Lymphocytes Relative: 24 %
Lymphs Abs: 2 10*3/uL (ref 0.7–4.0)
MCH: 29.8 pg (ref 26.0–34.0)
MCHC: 34.7 g/dL (ref 30.0–36.0)
MCV: 86 fL (ref 78.0–100.0)
MONO ABS: 1.4 10*3/uL — AB (ref 0.1–1.0)
Monocytes Relative: 17 %
Neutro Abs: 4.7 10*3/uL (ref 1.7–7.7)
Neutrophils Relative %: 56 %
PLATELETS: 216 10*3/uL (ref 150–400)
RBC: 5.2 MIL/uL (ref 4.22–5.81)
RDW: 13.3 % (ref 11.5–15.5)
WBC: 8.3 10*3/uL (ref 4.0–10.5)

## 2017-06-09 LAB — URINALYSIS, ROUTINE W REFLEX MICROSCOPIC
BILIRUBIN URINE: NEGATIVE
GLUCOSE, UA: NEGATIVE mg/dL
Hgb urine dipstick: NEGATIVE
Ketones, ur: 20 mg/dL — AB
Leukocytes, UA: NEGATIVE
NITRITE: NEGATIVE
PH: 5 (ref 5.0–8.0)
Protein, ur: NEGATIVE mg/dL
SPECIFIC GRAVITY, URINE: 1.02 (ref 1.005–1.030)

## 2017-06-09 LAB — PROTIME-INR
INR: 0.98
PROTHROMBIN TIME: 12.9 s (ref 11.4–15.2)

## 2017-06-09 LAB — TYPE AND SCREEN
ABO/RH(D): B POS
ANTIBODY SCREEN: NEGATIVE

## 2017-06-09 LAB — ABO/RH: ABO/RH(D): B POS

## 2017-06-09 LAB — POC OCCULT BLOOD, ED: Fecal Occult Bld: NEGATIVE

## 2017-06-09 MED ORDER — ASPIRIN EC 325 MG PO TBEC: 325.0000 mg | DELAYED_RELEASE_TABLET | Freq: Every day | ORAL | 0 refills | Status: DC

## 2017-06-09 MED ORDER — SODIUM CHLORIDE 0.9 % IV BOLUS (SEPSIS)
500.0000 mL | Freq: Once | INTRAVENOUS | Status: AC
  Administered 2017-06-09: 500 mL via INTRAVENOUS

## 2017-06-09 NOTE — Discharge Instructions (Signed)
Please continue taking Dexilant and start taking Aspirin daily. Please follow up closely with your primary care provider and please have repeat leg ultrasound by your doctor for recheck.

## 2017-06-09 NOTE — ED Triage Notes (Signed)
Patient c/o hematuria x 1 week. Patient having black stool x 1 week. Patient is also c/o left calf pain and swelling x 1 week, but worse today.

## 2017-06-09 NOTE — ED Provider Notes (Signed)
[facility]-EMERGENCY DEPT Provider Note   CSN: [id number] Arrival date & time: 06/09/17  1310     History   Chief Complaint Chief Complaint  Patient presents with  . Hematuria  . Leg Swelling  . Leg Pain    HPI Brandon Clark is a 76 y.o. male.  Brandon Clark is a 76 y.o. Male who presents to the ED complaining of back stool and hematuria for one week and about 5 days of left leg pain and swelling. Patient had a Niesen fundoplication on 06/02/2017 by Dr. Derrell Lollingamirez.  He was discharged about 4 days ago.  Patient reports he had a Niesen fundoplication for his acid reflux last week.  He reports about the day after surgery he noticed hematuria and then noticed black stool.  He reports watery loose stool.  He reports some mild right-sided abdominal pain that has not worsened since his surgery.  He tells me he was taking ibuprofen in the hospital but denies any ibuprofen or aspirin use recently.  He denies any constipation.  No vomiting.  No hematochezia.  He reports developing left calf pain and swelling about 5 days ago.  He denies any history of PE or DVT.  He denies having any chest pain, shortness of breath or coughing.  No treatments prior to arrival.  He denies fevers, shortness of breath, lightheadedness, dizziness, syncope, chest pain, dysuria, difficulty urinating, or rashes.    The history is provided by the patient, a friend and a relative. The history is limited by a language barrier. A language interpreter was used.  Hematuria  Associated symptoms include abdominal pain. Pertinent negatives include no chest pain, no headaches and no shortness of breath.  Leg Pain      Past Medical History:  Diagnosis Date  . Acid reflux   . BPH (benign prostatic hyperplasia)   . Hiatal hernia   . Hyperlipidemia   . Hypertension   . Pre-diabetes   . Sleep apnea    sometimes does not use d/t poor fitting of the mask  . Ulcer     Patient Active Problem  List   Diagnosis Date Noted  . S/P Nissen fundoplication (without gastrostomy tube) procedure 06/02/2017  . Dementia 05/12/2017  . Prediabetes 01/30/2017  . Hiatal hernia 01/27/2017  . Acid reflux 02/12/2016  . Sleep apnea 02/12/2016  . Diastasis recti 03/02/2015  . Umbilical hernia 09/16/2014  . BPV (benign positional vertigo) 07/01/2014  . HTN (hypertension) 07/01/2014  . Panic attack   . Encephalopathy, hypertensive   . Ataxia 06/30/2014  . Vertigo 06/30/2014  . Dyspepsia 04/03/2014  . Encounter for colorectal cancer screening 04/03/2014  . Bilateral shoulder pain 09/17/2013  . Bilateral knee pain 02/05/2013    Past Surgical History:  Procedure Laterality Date  . ESOPHAGEAL MANOMETRY N/A 04/27/2016   Procedure: ESOPHAGEAL MANOMETRY (EM) with PH;  Surgeon: Midge Miniumarren Wohl, MD;  Location: [facility] ENDOSCOPY;  Service: Endoscopy;  Laterality: N/A;  . ESOPHAGEAL MANOMETRY N/A 04/19/2017   Procedure: ESOPHAGEAL MANOMETRY (EM);  Surgeon: Charlott RakesSchooler, Vincent, MD;  Location: [facility] ENDOSCOPY;  Service: Endoscopy;  Laterality: N/A;  . FACIAL LACERATIONS REPAIR    . INSERTION OF MESH N/A 06/02/2017   Procedure: INSERTION OF MESH;  Surgeon: Axel Filleramirez, Armando, MD;  Location: [facility] ORS;  Service: General;  Laterality: N/A;       Home Medications    Prior to Admission medications   Medication Sig Start Date End Date Taking? Authorizing Provider  acetaminophen (TYLENOL) 500 MG tablet Take  500-1,000 mg by mouth every 6 (six) hours as needed.    [provider]  aspirin EC 325 MG tablet Take 1 tablet (325 mg total) by mouth daily. 06/09/17   Everlene Farrier, PA-C  dexlansoprazole (DEXILANT) 60 MG capsule Take 1 capsule (60 mg total) by mouth daily. 05/29/17   Jaclyn Shaggy, MD  donepezil (ARICEPT) 10 MG tablet Take 1 tablet (10 mg total) at bedtime by mouth. 05/12/17   Jaclyn Shaggy, MD  hydrochlorothiazide (HYDRODIURIL) 12.5 MG tablet Take 1 tablet (12.5 mg total) daily by mouth. 05/12/17   Jaclyn Shaggy, MD  HYDROcodone-acetaminophen (HYCET) 7.5-325 mg/15 ml solution Take 15 mLs by mouth 4 (four) times daily as needed for moderate pain. 06/05/17 06/05/18  Axel Filler, MD  lactulose (CHRONULAC) 10 GM/15ML solution Take 15 mLs (10 g total) by mouth 2 (two) times daily as needed for mild constipation. 03/22/16   Jaclyn Shaggy, MD  meloxicam (MOBIC) 7.5 MG tablet Take 7.5 mg by mouth daily. 05/13/16   [provider]  methocarbamol (ROBAXIN) 500 MG tablet Take 500 mg by mouth every 8 (eight) hours as needed for muscle spasms. 05/13/16   [provider]  simvastatin (ZOCOR) 10 MG tablet Take 1 tablet (10 mg total) at bedtime by mouth. 05/12/17   Jaclyn Shaggy, MD  tamsulosin (FLOMAX) 0.4 MG CAPS capsule Take 1 capsule (0.4 mg total) daily by mouth. 05/12/17   Jaclyn Shaggy, MD  traMADol (ULTRAM) 50 MG tablet Take 50 mg by mouth every 12 (twelve) hours as needed. 05/12/17   [provider]  traZODone (DESYREL) 50 MG tablet Take 1 tablet (50 mg total) at bedtime as needed by mouth for sleep. 05/12/17   Jaclyn Shaggy, MD    Family History Family History  Problem Relation Age of Onset  . Diabetes Mother   . Diabetes Brother   . Heart attack Neg Hx   . Hyperlipidemia Neg Hx   . Hypertension Neg Hx   . Sudden death Neg Hx     Social History Social History   Tobacco Use  . Smoking status: Never Smoker  . Smokeless tobacco: Never Used  Substance Use Topics  . Alcohol use: No    Alcohol/week: 0.0 oz  . Drug use: No     Allergies   Patient has no known allergies.   Review of Systems Review of Systems  Constitutional: Negative for chills and fever.  HENT: Negative for congestion and sore throat.   Eyes: Negative for visual disturbance.  Respiratory: Negative for cough, shortness of breath and wheezing.   Cardiovascular: Positive for leg swelling. Negative for chest pain and palpitations.  Gastrointestinal: Positive for abdominal pain, blood in stool and  diarrhea. Negative for constipation, nausea and vomiting.  Genitourinary: Positive for hematuria. Negative for difficulty urinating, dysuria, frequency, penile pain and testicular pain.  Musculoskeletal: Positive for arthralgias. Negative for back pain and neck pain.  Skin: Negative for rash.  Neurological: Negative for dizziness, syncope, light-headedness and headaches.     Physical Exam Updated Vital Signs BP (!) 129/91   Pulse 74   Temp 98.5 F (36.9 C) (Oral)   Resp 18   Ht 6' (1.829 m)   Wt 115.2 kg (254 lb)   SpO2 96%   BMI 34.45 kg/m   Physical Exam  Constitutional: He appears well-developed and well-nourished. No distress.  Nontoxic appearing.  HENT:  Head: Normocephalic and atraumatic.  Mouth/Throat: Oropharynx is clear and moist.  Eyes: Conjunctivae are normal. Pupils are equal,  round, and reactive to light. Right eye exhibits no discharge. Left eye exhibits no discharge.  Neck: Neck supple.  Cardiovascular: Normal rate, regular rhythm, normal heart sounds and intact distal pulses. Exam reveals no gallop and no friction rub.  No murmur heard. Pulmonary/Chest: Effort normal and breath sounds normal. No respiratory distress. He has no wheezes. He has no rales.  Abdominal: Soft. Bowel sounds are normal. He exhibits no distension and no mass. There is tenderness. There is no rebound and no guarding.  Abdomen is soft.  Bowel sounds are present.  Patient has well-healing laparoscopic scars noted to his abdomen.  He has some mild right upper and lower abdominal tenderness to palpation.  There is also some old appearing ecchymosis noted to the right side of his abdomen.  Genitourinary: Rectum normal.  Genitourinary Comments: Digital rectal exam with male RN as chaperone.  Brown stool on exam.  Musculoskeletal: He exhibits no edema.  Lymphadenopathy:    He has no cervical adenopathy.  Neurological: He is alert. Coordination normal.  Skin: Skin is warm and dry. No rash  noted. He is not diaphoretic. No erythema. No pallor.  Psychiatric: He has a normal mood and affect. His behavior is normal.  Nursing note and vitals reviewed.    ED Treatments / Results  Labs (all labs ordered are listed, but only abnormal results are displayed) Labs Reviewed  URINALYSIS, ROUTINE W REFLEX MICROSCOPIC - Abnormal; Notable for the following components:      Result Value   Color, Urine AMBER (*)    Ketones, ur 20 (*)    All other components within normal limits  COMPREHENSIVE METABOLIC PANEL - Abnormal; Notable for the following components:   Sodium 134 (*)    Chloride 97 (*)    Glucose, Bld 114 (*)    Calcium 8.7 (*)    ALT 105 (*)    Total Bilirubin 2.2 (*)    All other components within normal limits  CBC WITH DIFFERENTIAL/PLATELET - Abnormal; Notable for the following components:   Monocytes Absolute 1.4 (*)    All other components within normal limits  PROTIME-INR  POC OCCULT BLOOD, ED  TYPE AND SCREEN  ABO/RH    EKG  EKG Interpretation None       Radiology No results found.  Procedures Procedures (including critical care time)  Medications Ordered in ED Medications  sodium chloride 0.9 % bolus 500 mL (500 mLs Intravenous New Bag/Given 06/09/17 1854)     Initial Impression / Assessment and Plan / ED Course  I have reviewed the triage vital signs and the nursing notes.  Pertinent labs & imaging results that were available during my care of the patient were reviewed by me and considered in my medical decision making (see chart for details).    This is a 76 y.o. Male who presents to the ED complaining of back stool and hematuria for one week and about 5 days of left leg pain and swelling. Patient had a Niesen fundoplication on 06/02/2017 by Dr. Derrell Lollingamirez.  He was discharged about 4 days ago.  Patient reports he had a Niesen fundoplication for his acid reflux last week.  He reports about the day after surgery he noticed hematuria and then noticed  black stool.  He reports watery loose stool.  He reports some mild right-sided abdominal pain that has not worsened since his surgery.  He tells me he was taking ibuprofen in the hospital but denies any ibuprofen or aspirin use recently.  He  denies any constipation.  No vomiting.  No hematochezia.  He reports developing left calf pain and swelling about 5 days ago.  He denies any history of PE or DVT.  He denies having any chest pain, shortness of breath or coughing. On exam the patient is afebrile nontoxic-appearing.  His abdomen is soft and has some mild tenderness where there is some ptosis on his abdomen.  He has well-healing surgical scars.  On rectal exam he has brown stool.  Hemoccult negative.  Lungs are clear to auscultation bilaterally.  He has no hypoxia, tachypnea or tachycardia on exam.  He does have tenderness and mild edema noted to his left calf. CBC in the emergency department is unremarkable.  Normal hemoglobin of 15.5.  Hemoccult negative.  Urinalysis is without sign of hematuria. Cr around his baseline.  DVT study revealed a superficial calf thrombosis.  No evidence of DVT. I consulted with general surgeon Dr. Abbey Chatters due to his recent Niesen fundoplication.  I was unsure if he could take aspirin.  He reports that patient is able to take an enteric-coated aspirin.  He would like me to ensure that he is taking his proton pump inhibitor. I discussed the findings with the patient and family.  I encouraged him to continue taking his Dexilant.  Will start him on an enteric coated aspirin and have him follow up closely with PCP for repeat US.  I discussed that approximately 32% of superficial thrombosis clots can develop into DVT.  Work appears otherwise reassuring.  Will discharge with close follow-up by PCP and his general surgeon.  Strict and specific return precautions discussed. I advised the patient to follow-up with their primary care provider this week. I advised the patient to return  to the emergency department with new or worsening symptoms or new concerns. The patient and his son verbalized understanding and agreement with plan.    This patient was discussed with Dr. Madilyn Hook who agrees with assessment and plan.   Final Clinical Impressions(s) / ED Diagnoses   Final diagnoses:  Superficial thrombosis of left lower extremity  Dark stools    ED Discharge Orders        Ordered    aspirin EC 325 MG tablet  Daily    Comments:  Needs enteric coated ASA please.   06/09/17 2138       Everlene Farrier, PA-C 06/09/17 2156    Tilden Fossa, MD 06/11/17 [phone]

## 2017-06-09 NOTE — Progress Notes (Signed)
Left lower extremity venous duplex completed. No evidence of a DVT. Positive for a superficial thrombus noted in the mid to proxiaml; calf. No evidence of a Baker's cyst. Brandon Clark,RVS 06/09/2017 8:23 PM

## 2017-06-30 ENCOUNTER — Other Ambulatory Visit: Payer: Self-pay | Admitting: Family Medicine

## 2017-06-30 MED FILL — DONEPEZIL HCL 10 MG TABLET: 10 | 30 days supply | Qty: 30 | Fill #1

## 2017-06-30 MED FILL — HYDROCHLOROTHIAZIDE 12.5 MG: 12.5 | 30 days supply | Qty: 30 | Fill #1

## 2017-06-30 MED FILL — TAMSULOSIN HCL 0.4 MG CAP: 0.4 | 30 days supply | Qty: 30 | Fill #1

## 2017-06-30 MED FILL — DEXILANT DR 60 MG CAPSULE: 60 | 30 days supply | Qty: 30 | Fill #1

## 2017-07-26 ENCOUNTER — Ambulatory Visit (INDEPENDENT_AMBULATORY_CARE_PROVIDER_SITE_OTHER): Payer: Medicaid Other | Admitting: Orthopedic Surgery

## 2017-07-26 ENCOUNTER — Encounter (INDEPENDENT_AMBULATORY_CARE_PROVIDER_SITE_OTHER): Payer: Self-pay | Admitting: Orthopedic Surgery

## 2017-07-26 ENCOUNTER — Ambulatory Visit (INDEPENDENT_AMBULATORY_CARE_PROVIDER_SITE_OTHER): Payer: Medicaid Other

## 2017-07-26 DIAGNOSIS — M1711 Unilateral primary osteoarthritis, right knee: Secondary | ICD-10-CM | POA: Diagnosis not present

## 2017-07-26 DIAGNOSIS — M79642 Pain in left hand: Secondary | ICD-10-CM

## 2017-07-26 DIAGNOSIS — M1712 Unilateral primary osteoarthritis, left knee: Secondary | ICD-10-CM

## 2017-07-26 MED ORDER — TRAMADOL HCL 50 MG PO TABS: 50.0000 mg | ORAL_TABLET | Freq: Two times a day (BID) | ORAL | 0 refills | Status: DC | PRN

## 2017-07-28 MED FILL — traZODone HCL 50 MG TABS: 50 | 30 days supply | Qty: 30 | Fill #1

## 2017-07-28 MED FILL — traMADol HCL 50 MG TABS: 50 | 15 days supply | Qty: 30 | Fill #0

## 2017-07-28 MED FILL — SIMVASTATIN 10 MG TABLET: 10 | 30 days supply | Qty: 30 | Fill #1

## 2017-07-29 ENCOUNTER — Encounter (INDEPENDENT_AMBULATORY_CARE_PROVIDER_SITE_OTHER): Payer: Self-pay | Admitting: Orthopedic Surgery

## 2017-07-29 DIAGNOSIS — M1712 Unilateral primary osteoarthritis, left knee: Secondary | ICD-10-CM | POA: Diagnosis not present

## 2017-07-29 DIAGNOSIS — M1711 Unilateral primary osteoarthritis, right knee: Secondary | ICD-10-CM

## 2017-07-29 MED ORDER — BUPIVACAINE HCL 0.25 % IJ SOLN
4.0000 mL | INTRAMUSCULAR | Status: AC | PRN
  Administered 2017-07-29: 4 mL via INTRA_ARTICULAR

## 2017-07-29 MED ORDER — TRIAMCINOLONE ACETONIDE 40 MG/ML IJ SUSP
40.0000 mg | INTRAMUSCULAR | Status: AC | PRN
  Administered 2017-07-29: 40 mg via INTRA_ARTICULAR

## 2017-07-29 MED ORDER — LIDOCAINE HCL 1 % IJ SOLN
5.0000 mL | INTRAMUSCULAR | Status: AC | PRN
  Administered 2017-07-29: 5 mL

## 2017-07-29 MED ORDER — BUPIVACAINE HCL 0.25 % IJ SOLN
4.0000 mL | INTRAMUSCULAR | Status: AC | PRN
Start: 2017-07-29 — End: 2017-07-29
  Administered 2017-07-29: 4 mL via INTRA_ARTICULAR

## 2017-07-29 NOTE — Progress Notes (Signed)
Office Visit Note   Patient: Brandon Clark           Date of Birth: 01-06-1941           MRN: [id number] Visit Date: 07/26/2017 Requested by: Hoy Register, MD [address] [place], [place] [address] PCP: Hoy Register, MD  Subjective: Chief Complaint  Patient presents with  . Knee Pain    follow up bilateral knee OA    HPI: Patient presents for evaluation of bilateral knee pain and left hand pain.  Here with an interpreter which increases the level of difficulty of the clinic visit.  Chart review indicates bilateral knee injections April July and October of last year.  Radiographs of the knees demonstrate maintenance of the joint space no real severe bone-on-bone arthritis.  He does have recurrent effusions and mechanical symptoms in both knees with the left being the worst.  Patient also describes left hand pain and swelling.  Going on for 10 days.  It lasts as a foreign body removed from that hand 10 years ago.  Denies any known history of gout or pseudogout.  He has been taking Ultram and anti-inflammatories for his knee problems.              ROS: All systems reviewed are negative as they relate to the chief complaint within the history of present illness.  Patient denies  fevers or chills.   Assessment & Plan: Visit Diagnoses:  1. Unilateral primary osteoarthritis, left knee   2. Unilateral primary osteoarthritis, right knee   3. Pain in left hand     Plan: Impression is recurrent large effusion in both knees worse on the left-hand side.  Aspiration and injection is performed today as that has helped him in the past.  He needs MRI scan on that left knee to evaluate the source of this recurrent large effusion.  Possible this may be a presentation of rheumatoid arthritis or some other type of spondyloarthropathy.  Left hand pain and swelling could be occult infection related to the foreign body removal.  Needs MRI of that hand as well to rule out infection versus  retained loose body.  I will see him back after those studies.  Follow-Up Instructions: Return for after MRI.   Orders:  Orders Placed This Encounter  Procedures  . XR Hand Complete Left  . MR HAND LEFT W CONTRAST  . MR Knee Left w/o contrast   Meds ordered this encounter  Medications  . traMADol (ULTRAM) 50 MG tablet    Sig: Take 1 tablet (50 mg total) by mouth every 12 (twelve) hours as needed.    Dispense:  30 tablet    Refill:  0      Procedures: Large Joint Inj: bilateral knee on 07/29/2017 8:22 AM Indications: diagnostic evaluation, joint swelling and pain Details: 18 G 1.5 in needle, superolateral approach  Arthrogram: No  Medications (Right): 5 mL lidocaine 1 %; 40 mg triamcinolone acetonide 40 MG/ML; 4 mL bupivacaine 0.25 % Aspirate (Right): 10 mL yellow Medications (Left): 5 mL lidocaine 1 %; 40 mg triamcinolone acetonide 40 MG/ML; 4 mL bupivacaine 0.25 % Aspirate (Left): 60 mL yellow Outcome: tolerated well, no immediate complications Procedure, treatment alternatives, risks and benefits explained, specific risks discussed. Consent was given by the patient. Immediately prior to procedure a time out was called to verify the correct patient, procedure, equipment, support staff and site/side marked as required. Patient was prepped and draped in the usual sterile fashion.  Clinical Data: No additional findings.  Objective: Vital Signs: There were no vitals taken for this visit.  Physical Exam:  Constitutional: Patient appears well-developed HEENT:  Head: Normocephalic Eyes:EOM are normal Neck: Normal range of motion Cardiovascular: Normal rate Pulmonary/chest: Effort normal Neurologic: Patient is alert Skin: Skin is warm Psychiatric: Patient has normal mood and affect    Ortho Exam: Orthopedic exam demonstrates slight varus alignment with moderate effusion in the left knee and mild effusion in the right knee. Collateral and cruciate ligaments are  stable in both knees.  Range of motion is full.  There is no warmth to the knees.  No nerve root tension signs and no groin pain with internal/external rotation of either leg.  Extensor mechanism is intact bilaterally.  Left hand is examined.  He has a well-healed incision over the thenar eminence.  He does have some swelling in that first dorsal interosseous space.  No fluctuance or erythema is noted.  Finger flexion and extension is intact.  kanavels signs are negative. Specialty Comments:  No specialty comments available.  Imaging: No results found.   PMFS History: Patient Active Problem List   Diagnosis Date Noted  . S/P Nissen fundoplication (without gastrostomy tube) procedure 06/02/2017  . Dementia 05/12/2017  . Prediabetes 01/30/2017  . Hiatal hernia 01/27/2017  . Acid reflux 02/12/2016  . Sleep apnea 02/12/2016  . Diastasis recti 03/02/2015  . Umbilical hernia 09/16/2014  . BPV (benign positional vertigo) 07/01/2014  . HTN (hypertension) 07/01/2014  . Panic attack   . Encephalopathy, hypertensive   . Ataxia 06/30/2014  . Vertigo 06/30/2014  . Dyspepsia 04/03/2014  . Encounter for colorectal cancer screening 04/03/2014  . Bilateral shoulder pain 09/17/2013  . Bilateral knee pain 02/05/2013   Past Medical History:  Diagnosis Date  . Acid reflux   . BPH (benign prostatic hyperplasia)   . Hiatal hernia   . Hyperlipidemia   . Hypertension   . Pre-diabetes   . Sleep apnea    sometimes does not use d/t poor fitting of the mask  . Ulcer     Family History  Problem Relation Age of Onset  . Diabetes Mother   . Diabetes Brother   . Heart attack Neg Hx   . Hyperlipidemia Neg Hx   . Hypertension Neg Hx   . Sudden death Neg Hx     Past Surgical History:  Procedure Laterality Date  . ESOPHAGEAL MANOMETRY N/A 04/27/2016   Procedure: ESOPHAGEAL MANOMETRY (EM) with PH;  Surgeon: Midge Miniumarren Wohl, MD;  Location: [facility];  Service: Endoscopy;  Laterality: N/A;  .  ESOPHAGEAL MANOMETRY N/A 04/19/2017   Procedure: ESOPHAGEAL MANOMETRY (EM);  Surgeon: Charlott RakesSchooler, Vincent, MD;  Location: [facility];  Service: Endoscopy;  Laterality: N/A;  . FACIAL LACERATIONS REPAIR    . INSERTION OF MESH N/A 06/02/2017   Procedure: INSERTION OF MESH;  Surgeon: Axel Filleramirez, Armando, MD;  Location: [facility];  Service: General;  Laterality: N/A;   Social History   Occupational History  . Occupation: Art gallery managerngineer  Tobacco Use  . Smoking status: Never Smoker  . Smokeless tobacco: Never Used  Substance and Sexual Activity  . Alcohol use: No    Alcohol/week: 0.0 oz  . Drug use: No  . Sexual activity: No

## 2017-08-03 MED FILL — HYDROCHLOROTHIAZIDE 12.5 MG: 12.5 | 30 days supply | Qty: 30 | Fill #2

## 2017-08-03 MED FILL — TAMSULOSIN HCL 0.4 MG CAP: 0.4 | 30 days supply | Qty: 30 | Fill #2

## 2017-08-08 ENCOUNTER — Ambulatory Visit: Payer: Medicaid Other

## 2017-08-09 ENCOUNTER — Ambulatory Visit: Payer: Medicaid Other

## 2017-08-18 ENCOUNTER — Ambulatory Visit: Payer: Medicaid Other | Admitting: Family Medicine

## 2017-08-23 ENCOUNTER — Ambulatory Visit: Payer: Medicaid Other

## 2017-08-30 ENCOUNTER — Ambulatory Visit (INDEPENDENT_AMBULATORY_CARE_PROVIDER_SITE_OTHER): Payer: Medicaid Other | Admitting: Orthopedic Surgery

## 2017-08-30 ENCOUNTER — Telehealth (INDEPENDENT_AMBULATORY_CARE_PROVIDER_SITE_OTHER): Payer: Self-pay | Admitting: Orthopedic Surgery

## 2017-08-30 ENCOUNTER — Encounter (INDEPENDENT_AMBULATORY_CARE_PROVIDER_SITE_OTHER): Payer: Self-pay | Admitting: Orthopedic Surgery

## 2017-08-30 DIAGNOSIS — M170 Bilateral primary osteoarthritis of knee: Secondary | ICD-10-CM | POA: Diagnosis not present

## 2017-08-30 MED FILL — DONEPEZIL HCL 10 MG TABLET: 10 | 30 days supply | Qty: 30 | Fill #2

## 2017-08-30 NOTE — Telephone Encounter (Signed)
Patient is needing an interpreter for his MRI appointment and is requesting Terese DoorJamell Ali from [employer].

## 2017-08-30 NOTE — Progress Notes (Signed)
Office Visit Note   Patient: Brandon Clark           Date of Birth: Oct 05, 1940           MRN: [id number] Visit Date: 08/30/2017 Requested by: Hoy RegisterNewlin, Enobong, MD [address] [place], [place] [id number] PCP: Hoy RegisterNewlin, Enobong, MD  Subjective: Chief Complaint  Patient presents with  . Right Knee - Pain  . Left Knee - Pain    HPI: Patient presents for follow-up of bilateral knee pain.  He has had injection and aspiration done 07/26/2017 which did not help much.  States that he is walking with a little bit of a limp.  Reports constant pain in the knee.  This is on the left knee more than the right knee.  Has occasional low back pain but no numbness and tingling in the legs.  He is here with a translator today.  That increase the level of difficulty of the clinic visit.                ROS: All systems reviewed are negative as they relate to the chief complaint within the history of present illness.  Patient denies  fevers or chills.   Assessment & Plan: Visit Diagnoses:  1. Primary osteoarthritis of both knees     Plan: Impression is primary arthritis both knees with effusion in the left more than the right.  Patient also has some hip flexor weakness on the left-hand side.  For now we spent a lot of time working with the translator to get a date for his MRI scan.  Agrees were imaging trying to call him 4 times previously without response.  Today we have a firm date of 09/08/2017 with follow-up appointment on 311.  We will see what the scan shows and proceed from there.  Follow-Up Instructions: Return in about 12 days (around 09/11/2017).   Orders:  No orders of the defined types were placed in this encounter.  No orders of the defined types were placed in this encounter.     Procedures: No procedures performed   Clinical Data: No additional findings.  Objective: Vital Signs: There were no vitals taken for this visit.  Physical Exam:   Constitutional: Patient  appears well-developed HEENT:  Head: Normocephalic Eyes:EOM are normal Neck: Normal range of motion Cardiovascular: Normal rate Pulmonary/chest: Effort normal Neurologic: Patient is alert Skin: Skin is warm Psychiatric: Patient has normal mood and affect    Ortho Exam: Orthopedic exam demonstrates moderate effusion on the left mild effusion on the right.  Collateral cruciate ligaments are stable in both knees.  Pedal pulses palpable.  No nerve root tension signs and no paresthesias L1 S1 bilaterally.  Not much in the way of pain with forward and lateral bending.  No definite groin pain with internal/external rotation of the leg.  Specialty Comments:  No specialty comments available.  Imaging: No results found.   PMFS History: Patient Active Problem List   Diagnosis Date Noted  . S/P Nissen fundoplication (without gastrostomy tube) procedure 06/02/2017  . Dementia 05/12/2017  . Prediabetes 01/30/2017  . Hiatal hernia 01/27/2017  . Acid reflux 02/12/2016  . Sleep apnea 02/12/2016  . Diastasis recti 03/02/2015  . Umbilical hernia 09/16/2014  . BPV (benign positional vertigo) 07/01/2014  . HTN (hypertension) 07/01/2014  . Panic attack   . Encephalopathy, hypertensive   . Ataxia 06/30/2014  . Vertigo 06/30/2014  . Dyspepsia 04/03/2014  . Encounter for colorectal cancer screening 04/03/2014  . Bilateral  shoulder pain 09/17/2013  . Bilateral knee pain 02/05/2013   Past Medical History:  Diagnosis Date  . Acid reflux   . BPH (benign prostatic hyperplasia)   . Hiatal hernia   . Hyperlipidemia   . Hypertension   . Pre-diabetes   . Sleep apnea    sometimes does not use d/t poor fitting of the mask  . Ulcer     Family History  Problem Relation Age of Onset  . Diabetes Mother   . Diabetes Brother   . Heart attack Neg Hx   . Hyperlipidemia Neg Hx   . Hypertension Neg Hx   . Sudden death Neg Hx     Past Surgical History:  Procedure Laterality Date  . ESOPHAGEAL  MANOMETRY N/A 04/27/2016   Procedure: ESOPHAGEAL MANOMETRY (EM) with PH;  Surgeon: Midge Minium, MD;  Location: [facility];  Service: Endoscopy;  Laterality: N/A;  . ESOPHAGEAL MANOMETRY N/A 04/19/2017   Procedure: ESOPHAGEAL MANOMETRY (EM);  Surgeon: Charlott Rakes, MD;  Location: [facility];  Service: Endoscopy;  Laterality: N/A;  . FACIAL LACERATIONS REPAIR    . INSERTION OF MESH N/A 06/02/2017   Procedure: INSERTION OF MESH;  Surgeon: Axel Filler, MD;  Location: [facility];  Service: General;  Laterality: N/A;   Social History   Occupational History  . Occupation: Art gallery manager  Tobacco Use  . Smoking status: Never Smoker  . Smokeless tobacco: Never Used  Substance and Sexual Activity  . Alcohol use: No    Alcohol/week: 0.0 oz  . Drug use: No  . Sexual activity: No

## 2017-08-31 ENCOUNTER — Ambulatory Visit: Payer: Medicaid Other | Attending: Family Medicine | Admitting: Family Medicine

## 2017-08-31 ENCOUNTER — Encounter: Payer: Self-pay | Admitting: Family Medicine

## 2017-08-31 VITALS — BP 125/87 | HR 95 | Temp 97.6°F | Ht 73.0 in | Wt 235.0 lb

## 2017-08-31 DIAGNOSIS — G4700 Insomnia, unspecified: Secondary | ICD-10-CM | POA: Diagnosis not present

## 2017-08-31 DIAGNOSIS — G308 Other Alzheimer's disease: Secondary | ICD-10-CM | POA: Diagnosis not present

## 2017-08-31 DIAGNOSIS — R52 Pain, unspecified: Secondary | ICD-10-CM | POA: Insufficient documentation

## 2017-08-31 DIAGNOSIS — E7800 Pure hypercholesterolemia, unspecified: Secondary | ICD-10-CM

## 2017-08-31 DIAGNOSIS — G4730 Sleep apnea, unspecified: Secondary | ICD-10-CM | POA: Insufficient documentation

## 2017-08-31 DIAGNOSIS — M19042 Primary osteoarthritis, left hand: Secondary | ICD-10-CM | POA: Diagnosis not present

## 2017-08-31 DIAGNOSIS — G4709 Other insomnia: Secondary | ICD-10-CM | POA: Diagnosis not present

## 2017-08-31 DIAGNOSIS — M170 Bilateral primary osteoarthritis of knee: Secondary | ICD-10-CM | POA: Diagnosis not present

## 2017-08-31 DIAGNOSIS — I10 Essential (primary) hypertension: Secondary | ICD-10-CM

## 2017-08-31 DIAGNOSIS — R7303 Prediabetes: Secondary | ICD-10-CM | POA: Insufficient documentation

## 2017-08-31 DIAGNOSIS — Z791 Long term (current) use of non-steroidal anti-inflammatories (NSAID): Secondary | ICD-10-CM | POA: Insufficient documentation

## 2017-08-31 DIAGNOSIS — R42 Dizziness and giddiness: Secondary | ICD-10-CM | POA: Diagnosis not present

## 2017-08-31 DIAGNOSIS — M25562 Pain in left knee: Secondary | ICD-10-CM

## 2017-08-31 DIAGNOSIS — Z79899 Other long term (current) drug therapy: Secondary | ICD-10-CM | POA: Insufficient documentation

## 2017-08-31 DIAGNOSIS — F0280 Dementia in other diseases classified elsewhere without behavioral disturbance: Secondary | ICD-10-CM

## 2017-08-31 DIAGNOSIS — G309 Alzheimer's disease, unspecified: Secondary | ICD-10-CM | POA: Diagnosis not present

## 2017-08-31 DIAGNOSIS — M25561 Pain in right knee: Secondary | ICD-10-CM

## 2017-08-31 DIAGNOSIS — E785 Hyperlipidemia, unspecified: Secondary | ICD-10-CM | POA: Diagnosis not present

## 2017-08-31 DIAGNOSIS — Z7982 Long term (current) use of aspirin: Secondary | ICD-10-CM | POA: Diagnosis not present

## 2017-08-31 DIAGNOSIS — M19041 Primary osteoarthritis, right hand: Secondary | ICD-10-CM | POA: Insufficient documentation

## 2017-08-31 DIAGNOSIS — N400 Enlarged prostate without lower urinary tract symptoms: Secondary | ICD-10-CM | POA: Diagnosis not present

## 2017-08-31 DIAGNOSIS — K219 Gastro-esophageal reflux disease without esophagitis: Secondary | ICD-10-CM | POA: Insufficient documentation

## 2017-08-31 DIAGNOSIS — R399 Unspecified symptoms and signs involving the genitourinary system: Secondary | ICD-10-CM | POA: Diagnosis not present

## 2017-08-31 MED ORDER — TRAZODONE HCL 100 MG PO TABS: 100.0000 mg | ORAL_TABLET | Freq: Every evening | ORAL | 5 refills | Status: DC | PRN

## 2017-08-31 MED ORDER — MECLIZINE HCL 25 MG PO TABS: 25.0000 mg | ORAL_TABLET | Freq: Two times a day (BID) | ORAL | 3 refills | Status: DC | PRN

## 2017-08-31 MED ORDER — TAMSULOSIN HCL 0.4 MG PO CAPS: 0.4000 mg | ORAL_CAPSULE | Freq: Every day | ORAL | 5 refills | Status: DC

## 2017-08-31 MED ORDER — SIMVASTATIN 10 MG PO TABS: 10.0000 mg | ORAL_TABLET | Freq: Every day | ORAL | 5 refills | Status: DC

## 2017-08-31 MED ORDER — HYDROCHLOROTHIAZIDE 12.5 MG PO TABS: 12.5000 mg | ORAL_TABLET | Freq: Every day | ORAL | 5 refills | Status: DC

## 2017-08-31 MED ORDER — DONEPEZIL HCL 10 MG PO TABS: 10.0000 mg | ORAL_TABLET | Freq: Every day | ORAL | 5 refills | Status: DC

## 2017-08-31 MED ORDER — MELOXICAM 7.5 MG PO TABS: 7.5000 mg | ORAL_TABLET | Freq: Every day | ORAL | 1 refills | Status: DC

## 2017-08-31 MED FILL — SIMVASTATIN 10 MG TABLET: 10 | 30 days supply | Qty: 30 | Fill #1

## 2017-08-31 MED FILL — MELOXICAM 7.5 MG TABLET: 7.5 | 30 days supply | Qty: 30 | Fill #0

## 2017-08-31 MED FILL — TAMSULOSIN HCL 0.4 MG CAP: 0.4 | 30 days supply | Qty: 30 | Fill #0

## 2017-08-31 MED FILL — MECLIZINE 25 MG TABLET: 25 | 15 days supply | Qty: 30 | Fill #0

## 2017-08-31 MED FILL — traZODone HCL 50 MG TABS: 50 | 30 days supply | Qty: 30 | Fill #2

## 2017-08-31 MED FILL — HYDROCHLOROTHIAZIDE 12.5 MG: 12.5 | 30 days supply | Qty: 30 | Fill #3

## 2017-08-31 NOTE — Progress Notes (Signed)
Subjective:  Patient ID: Brandon Clark, male    DOB: [birth year]  Age: 77 y.o. MRN: [id number]  CC: Hypertension   HPI Brandon Clark is a 77 year old male with a history of hypertension, hyperlipidemia, GERD, Vertigo , hiatal hernia status post robotic assisted repair and nissen fundoplication in 05/2017 who comes into the clinic today for follow-up visit. His reflux symptoms have resolved ever since his surgery and he no longer needs Dexilant.  He was seen by Dr. August Saucerean of orthopedics for bilateral knee osteoarthritis and an MRI was ordered to further evaluate his symptoms as pain did not respond to  corticosteroid injections which he received a month ago.  He wears a left knee brace which helps with his pain.  Today he complains of pain and stiffness in his fingers which are worse in the morning and also complains of generalized body aches which she has had for several months.  Denies fever. His insomnia is not controlled on his current dose of trazodone and he is wondering if he can have an increase in dose. He also has vertigo but currently does not take medications for it.  Tolerating his antihypertensive and statin with no complaints of myalgias or adverse effect.  Past Medical History:  Diagnosis Date  . Acid reflux   . BPH (benign prostatic hyperplasia)   . Hiatal hernia   . Hyperlipidemia   . Hypertension   . Pre-diabetes   . Sleep apnea    sometimes does not use d/t poor fitting of the mask  . Ulcer     Past Surgical History:  Procedure Laterality Date  . ESOPHAGEAL MANOMETRY N/A 04/27/2016   Procedure: ESOPHAGEAL MANOMETRY (EM) with PH;  Surgeon: Midge Miniumarren Wohl, MD;  Location: [facility];  Service: Endoscopy;  Laterality: N/A;  . ESOPHAGEAL MANOMETRY N/A 04/19/2017   Procedure: ESOPHAGEAL MANOMETRY (EM);  Surgeon: Charlott RakesSchooler, Vincent, MD;  Location: [facility];  Service: Endoscopy;  Laterality: N/A;  . FACIAL LACERATIONS REPAIR    . INSERTION OF MESH N/A  06/02/2017   Procedure: INSERTION OF MESH;  Surgeon: Axel Filleramirez, Armando, MD;  Location: [facility];  Service: General;  Laterality: N/A;    No Known Allergies   Outpatient Medications Prior to Visit  Medication Sig Dispense Refill  . acetaminophen (TYLENOL) 500 MG tablet Take 500-1,000 mg by mouth every 6 (six) hours as needed.    Marland Kitchen. aspirin EC 325 MG tablet Take 1 tablet (325 mg total) by mouth daily. 30 tablet 0  . lactulose (CHRONULAC) 10 GM/15ML solution Take 15 mLs (10 g total) by mouth 2 (two) times daily as needed for mild constipation. 946 mL 1  . methocarbamol (ROBAXIN) 500 MG tablet Take 500 mg by mouth every 8 (eight) hours as needed for muscle spasms.    . traMADol (ULTRAM) 50 MG tablet Take 1 tablet (50 mg total) by mouth every 12 (twelve) hours as needed. 30 tablet 0  . dexlansoprazole (DEXILANT) 60 MG capsule Take 1 capsule (60 mg total) by mouth daily. 30 capsule 1  . donepezil (ARICEPT) 10 MG tablet Take 1 tablet (10 mg total) at bedtime by mouth. 30 tablet 3  . hydrochlorothiazide (HYDRODIURIL) 12.5 MG tablet Take 1 tablet (12.5 mg total) daily by mouth. 30 tablet 5  . HYDROcodone-acetaminophen (HYCET) 7.5-325 mg/15 ml solution Take 15 mLs by mouth 4 (four) times daily as needed for moderate pain. 120 mL 0  . meloxicam (MOBIC) 7.5 MG tablet Take 7.5 mg by mouth daily.    . simvastatin (  ZOCOR) 10 MG tablet Take 1 tablet (10 mg total) at bedtime by mouth. 30 tablet 5  . tamsulosin (FLOMAX) 0.4 MG CAPS capsule Take 1 capsule (0.4 mg total) daily by mouth. 30 capsule 5  . traZODone (DESYREL) 50 MG tablet Take 1 tablet (50 mg total) at bedtime as needed by mouth for sleep. 30 tablet 3   No facility-administered medications prior to visit.     ROS Review of Systems  Constitutional: Negative for activity change and appetite change.  HENT: Negative for sinus pressure and sore throat.   Eyes: Negative for visual disturbance.  Respiratory: Negative for cough, chest tightness and  shortness of breath.   Cardiovascular: Negative for chest pain and leg swelling.  Gastrointestinal: Negative for abdominal distention, abdominal pain, constipation and diarrhea.  Endocrine: Negative.   Genitourinary: Negative for dysuria.  Musculoskeletal:       See hpi  Skin: Negative for rash.  Allergic/Immunologic: Negative.   Neurological: Negative for weakness, light-headedness and numbness.  Psychiatric/Behavioral: Positive for sleep disturbance. Negative for dysphoric mood and suicidal ideas.    Objective:  BP 125/87   Pulse 95   Temp 97.6 F (36.4 C) (Oral)   Ht 6\' 1"  (1.854 m)   Wt 235 lb (106.6 kg)   SpO2 95%   BMI 31.00 kg/m   BP/Weight 08/31/2017 06/09/2017 06/06/2017  Systolic BP 125 138 138  Diastolic BP 87 81 91  Wt. (Lbs) 235 254 -  BMI 31 34.45 -      Physical Exam  Constitutional: He is oriented to person, place, and time. He appears well-developed and well-nourished.  Cardiovascular: Normal rate, normal heart sounds and intact distal pulses.  No murmur heard. Pulmonary/Chest: Effort normal and breath sounds normal. He has no wheezes. He has no rales. He exhibits no tenderness.  Abdominal: Soft. Bowel sounds are normal. He exhibits no distension and no mass. There is no tenderness.  Musculoskeletal: Normal range of motion. He exhibits edema (slight edema of PIP joints b/l).  Able to make a fist bilaterally Knee crepitus on ROM; L knee tenderness on flexion, extension  Neurological: He is alert and oriented to person, place, and time.  Skin: Skin is warm and dry.  Psychiatric: He has a normal mood and affect.     Assessment & Plan:   1. Lower urinary tract symptoms Stable - tamsulosin (FLOMAX) 0.4 MG CAPS capsule; Take 1 capsule (0.4 mg total) by mouth daily.  Dispense: 30 capsule; Refill: 5  2. Other insomnia Uncontrolled Increased dose of trazodone - traZODone (DESYREL) 100 MG tablet; Take 1 tablet (100 mg total) by mouth at bedtime as needed  for sleep.  Dispense: 30 tablet; Refill: 5  3. 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  Dispense: 30 tablet; Refill: 5  4. Pure hypercholesterolemia Controlled - simvastatin (ZOCOR) 10 MG tablet; Take 1 tablet (10 mg total) by mouth at bedtime.  Dispense: 30 tablet; Refill: 5  5. Essential hypertension Controlled Counseled on blood pressure goal of less than 130/80, low-sodium, DASH diet, medication compliance, 150 minutes of moderate intensity exercise per week. Discussed medication compliance, adverse effects. - hydrochlorothiazide (HYDRODIURIL) 12.5 MG tablet; Take 1 tablet (12.5 mg total) by mouth daily.  Dispense: 30 tablet; Refill: 5  6. Primary osteoarthritis of both hands - meloxicam (MOBIC) 7.5 MG tablet; Take 1 tablet (7.5 mg total) by mouth daily.  Dispense: 30  tablet; Refill: 1  7. Pain in both knees, unspecified chronicity Keep upcoming appointment for MRI of the knees Follow-up with orthopedics  8. Vertigo He has not been taking meclizine even though he has a diagnosis of vertigo. Placed on meclizine - meclizine (ANTIVERT) 25 MG tablet; Take 1 tablet (25 mg total) by mouth 2 (two) times daily as needed.  Dispense: 30 tablet; Refill: 3   Meds ordered this encounter  Medications  . tamsulosin (FLOMAX) 0.4 MG CAPS capsule    Sig: Take 1 capsule (0.4 mg total) by mouth daily.    Dispense:  30 capsule    Refill:  5  . traZODone (DESYREL) 100 MG tablet    Sig: Take 1 tablet (100 mg total) by mouth at bedtime as needed for sleep.    Dispense:  30 tablet    Refill:  5  . meclizine (ANTIVERT) 25 MG tablet    Sig: Take 1 tablet (25 mg total) by mouth 2 (two) times daily as needed.    Dispense:  30 tablet    Refill:  3  . donepezil (ARICEPT) 10 MG tablet    Sig: Take 1 tablet (10 mg total) by mouth at bedtime.    Dispense:  30 tablet    Refill:  5  .  simvastatin (ZOCOR) 10 MG tablet    Sig: Take 1 tablet (10 mg total) by mouth at bedtime.    Dispense:  30 tablet    Refill:  5  . hydrochlorothiazide (HYDRODIURIL) 12.5 MG tablet    Sig: Take 1 tablet (12.5 mg total) by mouth daily.    Dispense:  30 tablet    Refill:  5  . meloxicam (MOBIC) 7.5 MG tablet    Sig: Take 1 tablet (7.5 mg total) by mouth daily.    Dispense:  30 tablet    Refill:  1    Follow-up: Return in about 3 months (around 11/28/2017) for Follow-up of chronic medical conditions.   Hoy Register MD

## 2017-08-31 NOTE — Patient Instructions (Signed)

## 2017-09-01 NOTE — Telephone Encounter (Signed)
Imaging place will call interperter

## 2017-09-05 ENCOUNTER — Ambulatory Visit: Payer: Medicaid Other | Admitting: Family Medicine

## 2017-09-08 ENCOUNTER — Other Ambulatory Visit: Payer: Medicaid Other

## 2017-09-11 ENCOUNTER — Ambulatory Visit (INDEPENDENT_AMBULATORY_CARE_PROVIDER_SITE_OTHER): Payer: Medicaid Other | Admitting: Orthopedic Surgery

## 2017-09-17 ENCOUNTER — Ambulatory Visit
Admission: RE | Admit: 2017-09-17 | Discharge: 2017-09-17 | Disposition: A | Discharge status: Home / Self Care | Payer: Medicaid Other | Source: Ambulatory Visit | Attending: Orthopedic Surgery | Admitting: Orthopedic Surgery

## 2017-09-17 DIAGNOSIS — M1712 Unilateral primary osteoarthritis, left knee: Secondary | ICD-10-CM

## 2017-09-20 ENCOUNTER — Encounter (INDEPENDENT_AMBULATORY_CARE_PROVIDER_SITE_OTHER): Payer: Self-pay | Admitting: Orthopedic Surgery

## 2017-09-20 ENCOUNTER — Ambulatory Visit (INDEPENDENT_AMBULATORY_CARE_PROVIDER_SITE_OTHER): Payer: Medicaid Other | Admitting: Orthopedic Surgery

## 2017-09-20 DIAGNOSIS — M1711 Unilateral primary osteoarthritis, right knee: Secondary | ICD-10-CM | POA: Diagnosis not present

## 2017-09-20 DIAGNOSIS — M79604 Pain in right leg: Secondary | ICD-10-CM | POA: Diagnosis not present

## 2017-09-20 DIAGNOSIS — M79605 Pain in left leg: Secondary | ICD-10-CM | POA: Diagnosis not present

## 2017-09-20 DIAGNOSIS — M1712 Unilateral primary osteoarthritis, left knee: Secondary | ICD-10-CM | POA: Diagnosis not present

## 2017-09-23 ENCOUNTER — Encounter (INDEPENDENT_AMBULATORY_CARE_PROVIDER_SITE_OTHER): Payer: Self-pay | Admitting: Orthopedic Surgery

## 2017-09-23 DIAGNOSIS — M1711 Unilateral primary osteoarthritis, right knee: Secondary | ICD-10-CM | POA: Diagnosis not present

## 2017-09-23 DIAGNOSIS — M1712 Unilateral primary osteoarthritis, left knee: Secondary | ICD-10-CM

## 2017-09-23 MED ORDER — LIDOCAINE HCL 1 % IJ SOLN
5.0000 mL | INTRAMUSCULAR | Status: AC | PRN
  Administered 2017-09-23: 5 mL

## 2017-09-23 NOTE — Progress Notes (Signed)
Office Visit Note   Patient: Brandon Clark           Date of Birth: 25-Jun-1941           MRN: [id number] Visit Date: 09/20/2017 Requested by: Hoy Register, MD [address] [place], [place] [address] PCP: Hoy Register, MD  Subjective: Chief Complaint  Patient presents with  . Follow-up    review MRI scan    HPI: Patient presents for follow-up of left knee MRI scan.  Patient reports continued pain and weakness in the legs.  He has had a CT scan of his back done last year.  This is actually of his abdomen and pelvis.  I reviewed the scan in relation to its lumbar spine and nothing acutely stenotic was present.  This was not a scan design however for the lumbar spine.  MRI scan is reviewed.  Entire clinic visit today done through the translator which added to the complexity.  He does have on that left knee significant patellofemoral arthritis requiring potential intervention as well as a small meniscal tear on the medial side.  He reports continued recurrent effusions in the left knee.  Denies much in the way of mechanical symptoms.  He also reports weakness in both legs.              ROS: All systems reviewed are negative as they relate to the chief complaint within the history of present illness.  Patient denies  fevers or chills.   Assessment & Plan: Visit Diagnoses:  1. Bilateral leg pain   2. Unilateral primary osteoarthritis, left knee   3. Unilateral primary osteoarthritis, right knee     Plan: Impression is end-stage left knee patellofemoral arthritis with medial meniscal tear.  CT scan of the lumbar spine reviewed by me from CT of the abdomen pelvis done last year does not look severely stenotic.  Patient has bilateral hip flexor weakness which does affect his ability to get out of a chair.  He would like to have his knee replaced.  I think that could be considered but I would like to get some better reading and evaluation of this hip flexor weakness that he has  prior to undertaking the knee replacement.  Send him to Dr. Alvester Morin for evaluation with nerve conduction study of this hip flexor weakness.  I will see him back after that study.  No upper motor neuron signs today.  Follow-Up Instructions: No follow-ups on file.   Orders:  Orders Placed This Encounter  Procedures  . Ambulatory referral to Physical Medicine Rehab   No orders of the defined types were placed in this encounter.     Procedures: Large Joint Inj: bilateral knee on 09/23/2017 10:26 AM Indications: diagnostic evaluation, joint swelling and pain Details: 18 G 1.5 in needle, superolateral approach  Arthrogram: No  Medications (Right): 5 mL lidocaine 1 % Aspirate (Right): 5 mL yellow Medications (Left): 5 mL lidocaine 1 % Aspirate (Left): 60 mL yellow Outcome: tolerated well, no immediate complications Procedure, treatment alternatives, risks and benefits explained, specific risks discussed. Consent was given by the patient. Immediately prior to procedure a time out was called to verify the correct patient, procedure, equipment, support staff and site/side marked as required. Patient was prepped and draped in the usual sterile fashion.       Clinical Data: No additional findings.  Objective: Vital Signs: There were no vitals taken for this visit.  Physical Exam:   Constitutional: Patient appears well-developed HEENT:  Head: Normocephalic Eyes:EOM are normal Neck: Normal range of motion Cardiovascular: Normal rate Pulmonary/chest: Effort normal Neurologic: Patient is alert Skin: Skin is warm Psychiatric: Patient has normal mood and affect    Ortho Exam: Orthopedic exam demonstrates significant difficulty for the patient to get up out of the chair to standing position.  He has 4- out of 5 hip flexor weakness on the left and right hand side with no groin pain with internal/external rotation of the leg.  Pedal pulses palpable.  He has 5 out of 5 ankle dorsiflexion  plantarflexion quad and hamstring strength.  No definite paresthesias L1 S1 bilaterally.  Negative Babinski negative clonus bilaterally.  Reflexes symmetric 1+ out of 4 bilateral patella and Achilles.  Pedal pulses palpable.  Moderate effusion in the left knee no effusion in the right knee  Specialty Comments:  No specialty comments available.  Imaging: No results found.   PMFS History: Patient Active Problem List   Diagnosis Date Noted  . S/P Nissen fundoplication (without gastrostomy tube) procedure 06/02/2017  . Dementia 05/12/2017  . Prediabetes 01/30/2017  . Hiatal hernia 01/27/2017  . Acid reflux 02/12/2016  . Sleep apnea 02/12/2016  . Diastasis recti 03/02/2015  . Umbilical hernia 09/16/2014  . BPV (benign positional vertigo) 07/01/2014  . HTN (hypertension) 07/01/2014  . Panic attack   . Encephalopathy, hypertensive   . Ataxia 06/30/2014  . Vertigo 06/30/2014  . Dyspepsia 04/03/2014  . Encounter for colorectal cancer screening 04/03/2014  . Bilateral shoulder pain 09/17/2013  . Bilateral knee pain 02/05/2013   Past Medical History:  Diagnosis Date  . Acid reflux   . BPH (benign prostatic hyperplasia)   . Hiatal hernia   . Hyperlipidemia   . Hypertension   . Pre-diabetes   . Sleep apnea    sometimes does not use d/t poor fitting of the mask  . Ulcer     Family History  Problem Relation Age of Onset  . Diabetes Mother   . Diabetes Brother   . Heart attack Neg Hx   . Hyperlipidemia Neg Hx   . Hypertension Neg Hx   . Sudden death Neg Hx     Past Surgical History:  Procedure Laterality Date  . ESOPHAGEAL MANOMETRY N/A 04/27/2016   Procedure: ESOPHAGEAL MANOMETRY (EM) with PH;  Surgeon: Midge Miniumarren Wohl, MD;  Location: [facility];  Service: Endoscopy;  Laterality: N/A;  . ESOPHAGEAL MANOMETRY N/A 04/19/2017   Procedure: ESOPHAGEAL MANOMETRY (EM);  Surgeon: Charlott RakesSchooler, Vincent, MD;  Location: [facility];  Service: Endoscopy;  Laterality: N/A;  . FACIAL  LACERATIONS REPAIR    . INSERTION OF MESH N/A 06/02/2017   Procedure: INSERTION OF MESH;  Surgeon: Axel Filleramirez, Armando, MD;  Location: [facility];  Service: General;  Laterality: N/A;   Social History   Occupational History  . Occupation: Art gallery managerngineer  Tobacco Use  . Smoking status: Never Smoker  . Smokeless tobacco: Never Used  Substance and Sexual Activity  . Alcohol use: No    Alcohol/week: 0.0 oz  . Drug use: No  . Sexual activity: Never

## 2017-09-26 ENCOUNTER — Telehealth (INDEPENDENT_AMBULATORY_CARE_PROVIDER_SITE_OTHER): Payer: Self-pay

## 2017-09-26 DIAGNOSIS — R29898 Other symptoms and signs involving the musculoskeletal system: Secondary | ICD-10-CM

## 2017-09-26 NOTE — Telephone Encounter (Signed)
We referred patient to see Dr Alvester MorinNewton for bilateral lower extremity EMG/NCV for hip flexor weakness. He feels that patient would best benefit from neurology referral to evaluate and or do the study. Please advise how you want to proceed. Thanks.

## 2017-09-27 NOTE — Telephone Encounter (Signed)
-

## 2017-09-27 NOTE — Addendum Note (Signed)
Addended byPrescott Parma: Margueritte Guthridge on: 09/27/2017 01:54 PM   Modules accepted: Orders

## 2017-09-27 NOTE — Telephone Encounter (Signed)
Pls rf to neurology

## 2017-09-29 MED FILL — HYDROCHLOROTHIAZIDE 12.5 MG: 12.5 | 30 days supply | Qty: 30 | Fill #4

## 2017-09-29 MED FILL — TAMSULOSIN HCL 0.4 MG CAP: 0.4 | 30 days supply | Qty: 30 | Fill #1

## 2017-10-24 MED FILL — traZODone HCL 50 MG TABS: 50 | 30 days supply | Qty: 30 | Fill #3

## 2017-10-24 MED FILL — SIMVASTATIN 10 MG TABLET: 10 | 30 days supply | Qty: 30 | Fill #2

## 2017-10-24 MED FILL — TAMSULOSIN HCL 0.4 MG CAP: 0.4 | 30 days supply | Qty: 30 | Fill #2

## 2017-10-24 MED FILL — DONEPEZIL HCL 10 MG TABLET: 10 | 30 days supply | Qty: 30 | Fill #3

## 2017-10-24 MED FILL — HYDROCHLOROTHIAZIDE 12.5 MG: 12.5 | 30 days supply | Qty: 30 | Fill #5

## 2017-11-29 ENCOUNTER — Ambulatory Visit: Payer: Medicaid Other | Admitting: Family Medicine

## 2018-05-25 MED FILL — TAMSULOSIN HCL 0.4 MG CAP: 0.4 | 30 days supply | Qty: 30 | Fill #3

## 2018-06-07 ENCOUNTER — Ambulatory Visit: Payer: Medicaid Other | Admitting: Family Medicine

## 2018-06-12 ENCOUNTER — Ambulatory Visit: Payer: Self-pay | Attending: Family Medicine | Admitting: Family Medicine

## 2018-06-12 ENCOUNTER — Encounter: Payer: Self-pay | Admitting: Family Medicine

## 2018-06-12 VITALS — BP 116/78 | HR 90 | Temp 97.8°F | Ht 73.0 in | Wt 244.0 lb

## 2018-06-12 DIAGNOSIS — G309 Alzheimer's disease, unspecified: Secondary | ICD-10-CM | POA: Insufficient documentation

## 2018-06-12 DIAGNOSIS — G4700 Insomnia, unspecified: Secondary | ICD-10-CM | POA: Insufficient documentation

## 2018-06-12 DIAGNOSIS — R399 Unspecified symptoms and signs involving the genitourinary system: Secondary | ICD-10-CM

## 2018-06-12 DIAGNOSIS — G4709 Other insomnia: Secondary | ICD-10-CM

## 2018-06-12 DIAGNOSIS — Z7982 Long term (current) use of aspirin: Secondary | ICD-10-CM | POA: Insufficient documentation

## 2018-06-12 DIAGNOSIS — N401 Enlarged prostate with lower urinary tract symptoms: Secondary | ICD-10-CM | POA: Insufficient documentation

## 2018-06-12 DIAGNOSIS — F0280 Dementia in other diseases classified elsewhere without behavioral disturbance: Secondary | ICD-10-CM

## 2018-06-12 DIAGNOSIS — G308 Other Alzheimer's disease: Secondary | ICD-10-CM

## 2018-06-12 DIAGNOSIS — E7800 Pure hypercholesterolemia, unspecified: Secondary | ICD-10-CM

## 2018-06-12 DIAGNOSIS — Z79899 Other long term (current) drug therapy: Secondary | ICD-10-CM | POA: Insufficient documentation

## 2018-06-12 DIAGNOSIS — R7303 Prediabetes: Secondary | ICD-10-CM | POA: Insufficient documentation

## 2018-06-12 DIAGNOSIS — Z791 Long term (current) use of non-steroidal anti-inflammatories (NSAID): Secondary | ICD-10-CM | POA: Insufficient documentation

## 2018-06-12 DIAGNOSIS — I10 Essential (primary) hypertension: Secondary | ICD-10-CM

## 2018-06-12 MED ORDER — HYDROCHLOROTHIAZIDE 12.5 MG PO TABS: 12.5000 mg | ORAL_TABLET | Freq: Every day | ORAL | 5 refills | Status: DC

## 2018-06-12 MED ORDER — ZOLPIDEM TARTRATE 5 MG PO TABS: 5.0000 mg | ORAL_TABLET | Freq: Every evening | ORAL | 1 refills | Status: DC | PRN

## 2018-06-12 MED ORDER — SIMVASTATIN 10 MG PO TABS: 10.0000 mg | ORAL_TABLET | Freq: Every day | ORAL | 5 refills | Status: DC

## 2018-06-12 MED ORDER — DONEPEZIL HCL 10 MG PO TABS: 10.0000 mg | ORAL_TABLET | Freq: Every day | ORAL | 5 refills | Status: DC

## 2018-06-12 MED ORDER — TAMSULOSIN HCL 0.4 MG PO CAPS: 0.4000 mg | ORAL_CAPSULE | Freq: Every day | ORAL | 5 refills | Status: DC

## 2018-06-12 NOTE — Progress Notes (Signed)
Subjective:  Patient ID: Brandon Clark, male    DOB: Aug 09, 1940  Age: 77 y.o. MRN: [id number]  CC: Hypertension   HPI Brandon Clark  is a 77 year old male with a history of hypertension, hyperlipidemia, GERD, Vertigo , hiatal hernia status post robotic assisted repair and nissen fundoplication in 05/2017 who comes into the clinic today for a follow-up visit. He is concerned about memory loss which occurs in the form his loosing his train of thoughts while reading or writing and sometimes forgetting the lines during his prayer. He drives and has not lost his way while doing so and does not forget things on the stove. He is able to manage his finances with no difficulty; currently on Aricept which he states is not helping. Compliant with his antihypertensives and his other medications, reflux symptoms are controlled.  Past Medical History:  Diagnosis Date  . Acid reflux   . BPH (benign prostatic hyperplasia)   . Hiatal hernia   . Hyperlipidemia   . Hypertension   . Pre-diabetes   . Sleep apnea    sometimes does not use d/t poor fitting of the mask  . Ulcer     Past Surgical History:  Procedure Laterality Date  . ESOPHAGEAL MANOMETRY N/A 04/27/2016   Procedure: ESOPHAGEAL MANOMETRY (EM) with PH;  Surgeon: Midge Minium, MD;  Location: [facility];  Service: Endoscopy;  Laterality: N/A;  . ESOPHAGEAL MANOMETRY N/A 04/19/2017   Procedure: ESOPHAGEAL MANOMETRY (EM);  Surgeon: Charlott Rakes, MD;  Location: [facility];  Service: Endoscopy;  Laterality: N/A;  . FACIAL LACERATIONS REPAIR    . INSERTION OF MESH N/A 06/02/2017   Procedure: INSERTION OF MESH;  Surgeon: Axel Filler, MD;  Location: [facility];  Service: General;  Laterality: N/A;    No Known Allergies   Outpatient Medications Prior to Visit  Medication Sig Dispense Refill  . acetaminophen (TYLENOL) 500 MG tablet Take 500-1,000 mg by mouth every 6 (six) hours as needed.    Marland Kitchen aspirin EC 325 MG tablet  Take 1 tablet (325 mg total) by mouth daily. 30 tablet 0  . lactulose (CHRONULAC) 10 GM/15ML solution Take 15 mLs (10 g total) by mouth 2 (two) times daily as needed for mild constipation. 946 mL 1  . meclizine (ANTIVERT) 25 MG tablet Take 1 tablet (25 mg total) by mouth 2 (two) times daily as needed. 30 tablet 3  . meloxicam (MOBIC) 7.5 MG tablet Take 1 tablet (7.5 mg total) by mouth daily. 30 tablet 1  . methocarbamol (ROBAXIN) 500 MG tablet Take 500 mg by mouth every 8 (eight) hours as needed for muscle spasms.    . traMADol (ULTRAM) 50 MG tablet Take 1 tablet (50 mg total) by mouth every 12 (twelve) hours as needed. 30 tablet 0  . donepezil (ARICEPT) 10 MG tablet Take 1 tablet (10 mg total) by mouth at bedtime. 30 tablet 5  . hydrochlorothiazide (HYDRODIURIL) 12.5 MG tablet Take 1 tablet (12.5 mg total) by mouth daily. 30 tablet 5  . simvastatin (ZOCOR) 10 MG tablet Take 1 tablet (10 mg total) by mouth at bedtime. 30 tablet 5  . tamsulosin (FLOMAX) 0.4 MG CAPS capsule Take 1 capsule (0.4 mg total) by mouth daily. 30 capsule 5  . traZODone (DESYREL) 100 MG tablet Take 1 tablet (100 mg total) by mouth at bedtime as needed for sleep. 30 tablet 5   No facility-administered medications prior to visit.     ROS Review of Systems  Constitutional: Negative for activity change  and appetite change.  HENT: Negative for sinus pressure and sore throat.   Eyes: Negative for visual disturbance.  Respiratory: Negative for cough, chest tightness and shortness of breath.   Cardiovascular: Negative for chest pain and leg swelling.  Gastrointestinal: Negative for abdominal distention, abdominal pain, constipation and diarrhea.  Endocrine: Negative.   Genitourinary: Negative for dysuria.  Musculoskeletal: Negative for joint swelling and myalgias.  Skin: Negative for rash.  Allergic/Immunologic: Negative.   Neurological: Negative for weakness, light-headedness and numbness.  Psychiatric/Behavioral:  Negative for dysphoric mood and suicidal ideas.    Objective:  BP 116/78   Pulse 90   Temp 97.8 F (36.6 C) (Oral)   Ht 6\' 1"  (1.854 m)   Wt 244 lb (110.7 kg)   SpO2 95%   BMI 32.19 kg/m   BP/Weight 06/12/2018 08/31/2017 06/09/2017  Systolic BP 116 125 138  Diastolic BP 78 87 81  Wt. (Lbs) 244 235 254  BMI 32.19 31 34.45      Physical Exam  Constitutional: He is oriented to person, place, and time. He appears well-developed and well-nourished.  Cardiovascular: Normal rate, normal heart sounds and intact distal pulses.  No murmur heard. Pulmonary/Chest: Effort normal and breath sounds normal. He has no wheezes. He has no rales. He exhibits no tenderness.  Abdominal: Soft. Bowel sounds are normal. He exhibits no distension and no mass. There is no tenderness.  Umbilical hernia,not tender, reducible  Musculoskeletal: Normal range of motion.  Neurological: He is alert and oriented to person, place, and time.  Skin: Skin is warm and dry.  Psychiatric: He has a normal mood and affect.    MMSE - Mini Mental State Exam 06/12/2018  Orientation to time 4  Orientation to Place 5  Registration 3  Attention/ Calculation 0  Recall 3  Language- name 2 objects 2  Language- repeat 1  Language- follow 3 step command 3  Language- read & follow direction 1  Write a sentence 1  Copy design 1  Total score 24    Assessment & Plan:   1. Essential hypertension Controlled Counseled on blood pressure goal of less than 130/80, low-sodium, DASH diet, medication compliance, 150 minutes of moderate intensity exercise per week. Discussed medication compliance, adverse effects. - hydrochlorothiazide (HYDRODIURIL) 12.5 MG tablet; Take 1 tablet (12.5 mg total) by mouth daily.  Dispense: 30 tablet; Refill: 5 - Basic Metabolic Panel  2. Pure hypercholesterolemia Controlled Low choldiet - simvastatin (ZOCOR) 10 MG tablet; Take 1 tablet (10 mg total) by mouth at bedtime.  Dispense: 30 tablet;  Refill: 5  3. Lower urinary tract symptoms Stable - tamsulosin (FLOMAX) 0.4 MG CAPS capsule; Take 1 capsule (0.4 mg total) by mouth daily.  Dispense: 30 capsule; Refill: 5  4. 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  Dispense: 30 tablet; Refill: 5 - Vitamin B12 - Ambulatory referral to Neurology  5. Other insomnia Uncontrolled on Trazodone Commenced Ambien   Meds ordered this encounter  Medications  . zolpidem (AMBIEN) 5 MG tablet    Sig: Take 1 tablet (5 mg total) by mouth at bedtime as needed for sleep.    Dispense:  15 tablet  Refill:  1  . hydrochlorothiazide (HYDRODIURIL) 12.5 MG tablet    Sig: Take 1 tablet (12.5 mg total) by mouth daily.    Dispense:  30 tablet    Refill:  5  . simvastatin (ZOCOR) 10 MG tablet    Sig: Take 1 tablet (10 mg total) by mouth at bedtime.    Dispense:  30 tablet    Refill:  5  . tamsulosin (FLOMAX) 0.4 MG CAPS capsule    Sig: Take 1 capsule (0.4 mg total) by mouth daily.    Dispense:  30 capsule    Refill:  5  . donepezil (ARICEPT) 10 MG tablet    Sig: Take 1 tablet (10 mg total) by mouth at bedtime.    Dispense:  30 tablet    Refill:  5    Follow-up: Return in about 6 months (around 12/12/2018) for follow up of chronic medical condition.   Hoy Register MD

## 2018-06-12 NOTE — Progress Notes (Signed)
Patient is having trouble with memory.  Patient is having trouble sleeping.

## 2018-06-18 MED FILL — TAMSULOSIN HCL 0.4 MG CAP: 0.4 | 30 days supply | Qty: 30 | Fill #0

## 2018-06-18 MED FILL — DONEPEZIL HCL 10 MG TABLET: 10 | 30 days supply | Qty: 30 | Fill #0

## 2018-06-18 MED FILL — SIMVASTATIN 10 MG TABLET: 10 | 30 days supply | Qty: 30 | Fill #0

## 2018-06-18 MED FILL — HYDROCHLOROTHIAZIDE 12.5 MG: 12.5 | 30 days supply | Qty: 30 | Fill #0

## 2018-08-08 MED FILL — HYDROCHLOROTHIAZIDE 12.5 MG: 12.5 | 30 days supply | Qty: 30 | Fill #1

## 2018-08-08 MED FILL — TAMSULOSIN HCL 0.4 MG CAP: 0.4 | 30 days supply | Qty: 30 | Fill #1

## 2019-02-22 ENCOUNTER — Emergency Department (HOSPITAL_COMMUNITY): Admission: EM | Admit: 2019-02-22 | Discharge: 2019-02-22 | Discharge status: Absent without leave | Payer: Self-pay

## 2019-03-01 ENCOUNTER — Ambulatory Visit: Payer: Self-pay

## 2019-03-01 ENCOUNTER — Emergency Department (HOSPITAL_COMMUNITY)
Admission: EM | Admit: 2019-03-01 | Discharge: 2019-03-01 | Disposition: A | Discharge status: Home / Self Care | Payer: Self-pay | Attending: Emergency Medicine | Admitting: Emergency Medicine

## 2019-03-01 DIAGNOSIS — Y929 Unspecified place or not applicable: Secondary | ICD-10-CM | POA: Insufficient documentation

## 2019-03-01 DIAGNOSIS — Z203 Contact with and (suspected) exposure to rabies: Secondary | ICD-10-CM | POA: Insufficient documentation

## 2019-03-01 DIAGNOSIS — Z79899 Other long term (current) drug therapy: Secondary | ICD-10-CM | POA: Insufficient documentation

## 2019-03-01 DIAGNOSIS — W540XXA Bitten by dog, initial encounter: Secondary | ICD-10-CM | POA: Insufficient documentation

## 2019-03-01 DIAGNOSIS — I10 Essential (primary) hypertension: Secondary | ICD-10-CM | POA: Insufficient documentation

## 2019-03-01 DIAGNOSIS — B029 Zoster without complications: Secondary | ICD-10-CM | POA: Insufficient documentation

## 2019-03-01 DIAGNOSIS — Z7982 Long term (current) use of aspirin: Secondary | ICD-10-CM | POA: Insufficient documentation

## 2019-03-01 DIAGNOSIS — Y999 Unspecified external cause status: Secondary | ICD-10-CM | POA: Insufficient documentation

## 2019-03-01 DIAGNOSIS — R42 Dizziness and giddiness: Secondary | ICD-10-CM | POA: Insufficient documentation

## 2019-03-01 DIAGNOSIS — Y9301 Activity, walking, marching and hiking: Secondary | ICD-10-CM | POA: Insufficient documentation

## 2019-03-01 DIAGNOSIS — Z23 Encounter for immunization: Secondary | ICD-10-CM | POA: Insufficient documentation

## 2019-03-01 DIAGNOSIS — S81851A Open bite, right lower leg, initial encounter: Secondary | ICD-10-CM | POA: Insufficient documentation

## 2019-03-01 LAB — COMPREHENSIVE METABOLIC PANEL
ALT: 15 U/L (ref 0–44)
AST: 17 U/L (ref 15–41)
Albumin: 3.8 g/dL (ref 3.5–5.0)
Alkaline Phosphatase: 46 U/L (ref 38–126)
Anion gap: 10 (ref 5–15)
BUN: 16 mg/dL (ref 8–23)
CO2: 25 mmol/L (ref 22–32)
Calcium: 8.9 mg/dL (ref 8.9–10.3)
Chloride: 102 mmol/L (ref 98–111)
Creatinine, Ser: 1.03 mg/dL (ref 0.61–1.24)
GFR calc Af Amer: >60 mL/min (ref >60)
GFR calc non Af Amer: >60 mL/min (ref >60)
Glucose, Bld: 111 mg/dL — ABNORMAL HIGH (ref 70–99)
Potassium: 4 mmol/L (ref 3.5–5.1)
Sodium: 137 mmol/L (ref 135–145)
Total Bilirubin: 1.1 mg/dL (ref 0.3–1.2)
Total Protein: 6.3 g/dL — ABNORMAL LOW (ref 6.5–8.1)

## 2019-03-01 LAB — CBC WITH DIFFERENTIAL/PLATELET
Abs Immature Granulocytes: 0.03 10*3/uL (ref 0.00–0.07)
Basophils Absolute: 0.1 10*3/uL (ref 0.0–0.1)
Basophils Relative: 1 %
Eosinophils Absolute: 0.1 10*3/uL (ref 0.0–0.5)
Eosinophils Relative: 3 %
HCT: 50.2 % (ref 39.0–52.0)
Hemoglobin: 16.7 g/dL (ref 13.0–17.0)
Immature Granulocytes: 1 %
Lymphocytes Relative: 49 %
Lymphs Abs: 2.1 10*3/uL (ref 0.7–4.0)
MCH: 29.8 pg (ref 26.0–34.0)
MCHC: 33.3 g/dL (ref 30.0–36.0)
MCV: 89.6 fL (ref 80.0–100.0)
Monocytes Absolute: 0.8 10*3/uL (ref 0.1–1.0)
Monocytes Relative: 17 %
Neutro Abs: 1.3 10*3/uL — ABNORMAL LOW (ref 1.7–7.7)
Neutrophils Relative %: 29 %
Platelets: 202 10*3/uL (ref 150–400)
RBC: 5.6 MIL/uL (ref 4.22–5.81)
RDW: 13.2 % (ref 11.5–15.5)
WBC: 4.3 10*3/uL (ref 4.0–10.5)
nRBC: 0 % (ref 0.0–0.2)

## 2019-03-01 MED ORDER — VALACYCLOVIR HCL 1 G PO TABS: 1000.0000 mg | ORAL_TABLET | Freq: Three times a day (TID) | ORAL | 0 refills | Status: DC

## 2019-03-01 MED ORDER — SODIUM CHLORIDE 0.9 % IV BOLUS
1000.0000 mL | Freq: Once | INTRAVENOUS | Status: AC
  Administered 2019-03-01: 13:00:00 1000 mL via INTRAVENOUS

## 2019-03-01 MED ORDER — TETANUS-DIPHTH-ACELL PERTUSSIS 5-2.5-18.5 LF-MCG/0.5 IM SUSP
0.5000 mL | Freq: Once | INTRAMUSCULAR | Status: AC
  Administered 2019-03-01: 13:00:00 0.5 mL via INTRAMUSCULAR
  Filled 2019-03-01: qty 0.5

## 2019-03-01 MED ORDER — HYDROCODONE-ACETAMINOPHEN 5-325 MG PO TABS: 1.0000 | ORAL_TABLET | ORAL | 0 refills | Status: DC | PRN

## 2019-03-01 MED ORDER — RABIES IMMUNE GLOBULIN 150 UNIT/ML IM INJ
20.0000 [IU]/kg | INJECTION | Freq: Once | INTRAMUSCULAR | Status: AC
  Administered 2019-03-01: 13:00:00 2250 [IU] via INTRAMUSCULAR
  Filled 2019-03-01: qty 15

## 2019-03-01 MED ORDER — RABIES VACCINE, PCEC IM SUSR
1.0000 mL | Freq: Once | INTRAMUSCULAR | Status: AC
  Administered 2019-03-01: 13:00:00 1 mL via INTRAMUSCULAR
  Filled 2019-03-01: qty 1

## 2019-03-01 MED ORDER — ACETAMINOPHEN 325 MG PO TABS
650.0000 mg | ORAL_TABLET | Freq: Once | ORAL | Status: AC
Start: 2019-03-01 — End: 2019-03-01
  Administered 2019-03-01: 650 mg via ORAL
  Filled 2019-03-01: qty 2

## 2019-03-01 MED FILL — valACYclovir HCL 1 GM TABS: 1 | 7 days supply | Qty: 21 | Fill #0

## 2019-03-01 NOTE — Discharge Instructions (Addendum)
°                                  RABIES VACCINE FOLLOW UP  Patient's Name: Brandon Clark                     Original Order Date:03/01/2019  Medical Record Number: [id number]  ED Physician: Sherwood Gambler, MD Primary Diagnosis: Rabies Exposure       PCP: Charlott Rakes, MD  Patient Phone Number: (home) [phone] (home)    (cell)  Telephone Information:  Mobile [phone]  Mobile [phone]    (work) There is no work phone number on file. Species of Animal:     You have been seen in the Emergency Department for a possible rabies exposure. It's very important you return for the additional vaccine doses.  Please call the clinic listed below for hours of operation.   Clinic that will administer your rabies vaccines:    DAY 0:  03/01/2019      DAY 3:  03/04/2019       DAY 7:  03/08/2019     DAY 14:  03/15/2019         The 5th vaccine injection is considered for immune compromised patients only.  DAY 28:  03/29/2019

## 2019-03-01 NOTE — ED Triage Notes (Signed)
Patient crying in triage, grabbing L flank in pain.

## 2019-03-01 NOTE — ED Notes (Signed)
Pt is very tearful at this time. 

## 2019-03-01 NOTE — ED Provider Notes (Signed)
[facility] Provider Note   CSN: [id number] Arrival date & time: 03/01/19  1058     History   Chief Complaint Chief Complaint  Patient presents with  . Flank Pain    HPI Brandon Clark is a 78 y.o. male.     HPI  78 year old male presents with a chief complaint of left flank pain.  The Arabic interpreter was used for assistance. Started 2 days ago.  He noticed a small rash that has worsened in the same area.  Pain is an 8 out of 10.  He has not taken anything for the pain.  The patient also notes that 1 week ago he had right flank pain but this spontaneously resolved.  2 days ago after going to the bathroom he felt lightheaded and was diaphoretic.  He states this did not start until he got back to his bed.  He did not pass out but felt like he might.  There was no chest pain or headache.  Has not recurred since.  In addition to this, he was bit by a dog 1 week ago.  He was walking when this happened and he does not know who owns the dog or if it is a stray.  He has been applying antibiotic ointment and cleaning it and has not had any pain or signs of an infection.  No fevers.  He is very concerned about his kidney function as he has been told he has chronic kidney disease and this pain is over his left flank.  No urinary symptoms.  Past Medical History:  Diagnosis Date  . Acid reflux   . BPH (benign prostatic hyperplasia)   . Hiatal hernia   . Hyperlipidemia   . Hypertension   . Pre-diabetes   . Sleep apnea    sometimes does not use d/t poor fitting of the mask  . Ulcer     Patient Active Problem List   Diagnosis Date Noted  . S/P Nissen fundoplication (without gastrostomy tube) procedure 06/02/2017  . Dementia (HCC) 05/12/2017  . Prediabetes 01/30/2017  . Hiatal hernia 01/27/2017  . Acid reflux 02/12/2016  . Sleep apnea 02/12/2016  . Diastasis recti 03/02/2015  . Umbilical hernia 09/16/2014  . BPV (benign positional vertigo)  07/01/2014  . HTN (hypertension) 07/01/2014  . Panic attack   . Encephalopathy, hypertensive   . Ataxia 06/30/2014  . Vertigo 06/30/2014  . Dyspepsia 04/03/2014  . Encounter for colorectal cancer screening 04/03/2014  . Bilateral shoulder pain 09/17/2013  . Bilateral knee pain 02/05/2013    Past Surgical History:  Procedure Laterality Date  . ESOPHAGEAL MANOMETRY N/A 04/27/2016   Procedure: ESOPHAGEAL MANOMETRY (EM) with PH;  Surgeon: Midge Miniumarren Wohl, MD;  Location: [facility] ENDOSCOPY;  Service: Endoscopy;  Laterality: N/A;  . ESOPHAGEAL MANOMETRY N/A 04/19/2017   Procedure: ESOPHAGEAL MANOMETRY (EM);  Surgeon: Charlott RakesSchooler, Vincent, MD;  Location: [facility] ENDOSCOPY;  Service: Endoscopy;  Laterality: N/A;  . FACIAL LACERATIONS REPAIR    . INSERTION OF MESH N/A 06/02/2017   Procedure: INSERTION OF MESH;  Surgeon: Axel Filleramirez, Armando, MD;  Location: [facility] ORS;  Service: General;  Laterality: N/A;        Home Medications    Prior to Admission medications   Medication Sig Start Date End Date Taking? Authorizing Provider  acetaminophen (TYLENOL) 500 MG tablet Take 500-1,000 mg by mouth every 6 (six) hours as needed.    [provider]  aspirin EC 325 MG tablet Take 1 tablet (325 mg  total) by mouth daily. 06/09/17   Everlene Farrieransie, William, PA-C  donepezil (ARICEPT) 10 MG tablet Take 1 tablet (10 mg total) by mouth at bedtime. 06/12/18   Hoy RegisterNewlin, Enobong, MD  hydrochlorothiazide (HYDRODIURIL) 12.5 MG tablet Take 1 tablet (12.5 mg total) by mouth daily. 06/12/18   Hoy RegisterNewlin, Enobong, MD  HYDROcodone-acetaminophen (NORCO) 5-325 MG tablet Take 1 tablet by mouth every 4 (four) hours as needed for severe pain. 03/01/19   Pricilla LovelessGoldston, Jeffrey Voth, MD  lactulose (CHRONULAC) 10 GM/15ML solution Take 15 mLs (10 g total) by mouth 2 (two) times daily as needed for mild constipation. 03/22/16   Hoy RegisterNewlin, Enobong, MD  meclizine (ANTIVERT) 25 MG tablet Take 1 tablet (25 mg total) by mouth 2 (two) times daily as needed. 08/31/17   Hoy RegisterNewlin,  Enobong, MD  meloxicam (MOBIC) 7.5 MG tablet Take 1 tablet (7.5 mg total) by mouth daily. 08/31/17   Hoy RegisterNewlin, Enobong, MD  methocarbamol (ROBAXIN) 500 MG tablet Take 500 mg by mouth every 8 (eight) hours as needed for muscle spasms. 05/13/16   [provider]  simvastatin (ZOCOR) 10 MG tablet Take 1 tablet (10 mg total) by mouth at bedtime. 06/12/18   Hoy RegisterNewlin, Enobong, MD  tamsulosin (FLOMAX) 0.4 MG CAPS capsule Take 1 capsule (0.4 mg total) by mouth daily. 06/12/18   Hoy RegisterNewlin, Enobong, MD  traMADol (ULTRAM) 50 MG tablet Take 1 tablet (50 mg total) by mouth every 12 (twelve) hours as needed. 07/26/17   Cammy Copaean, Gregory Blakeleigh Domek, MD  valACYclovir (VALTREX) 1000 MG tablet Take 1 tablet (1,000 mg total) by mouth 3 (three) times daily. 03/01/19   Pricilla LovelessGoldston, Manuelita Moxon, MD  zolpidem (AMBIEN) 5 MG tablet Take 1 tablet (5 mg total) by mouth at bedtime as needed for sleep. 06/12/18   Hoy RegisterNewlin, Enobong, MD    Family History Family History  Problem Relation Age of Onset  . Diabetes Mother   . Diabetes Brother   . Heart attack Neg Hx   . Hyperlipidemia Neg Hx   . Hypertension Neg Hx   . Sudden death Neg Hx     Social History Social History   Tobacco Use  . Smoking status: Never Smoker  . Smokeless tobacco: Never Used  Substance Use Topics  . Alcohol use: No    Alcohol/week: 0.0 standard drinks  . Drug use: No     Allergies   Patient has no known allergies.   Review of Systems Review of Systems  Constitutional: Negative for fever.  Cardiovascular: Negative for chest pain.  Gastrointestinal: Negative for abdominal pain and vomiting.  Genitourinary: Positive for flank pain. Negative for dysuria and hematuria.  Skin: Positive for rash.  Neurological: Positive for light-headedness. Negative for syncope and headaches.  All other systems reviewed and are negative.    Physical Exam Updated Vital Signs BP (!) 165/109 (BP Location: Right Arm)   Pulse 83   Temp 97.8 F (36.6 C) (Oral)   Resp 18    Wt 110.7 kg   SpO2 96%   BMI 32.19 kg/m   Physical Exam Vitals signs and nursing note reviewed.  Constitutional:      General: He is not in acute distress.    Appearance: He is well-developed. He is obese. He is not ill-appearing or diaphoretic.  HENT:     Head: Normocephalic and atraumatic.     Right Ear: External ear normal.     Left Ear: External ear normal.     Nose: Nose normal.  Eyes:     General:  Right eye: No discharge.        Left eye: No discharge.  Neck:     Musculoskeletal: Neck supple.  Cardiovascular:     Rate and Rhythm: Normal rate and regular rhythm.     Heart sounds: Normal heart sounds.  Pulmonary:     Effort: Pulmonary effort is normal.     Breath sounds: Normal breath sounds.  Abdominal:     General: There is no distension.     Palpations: Abdomen is soft.     Tenderness: There is no abdominal tenderness.  Musculoskeletal:     Right lower leg: He exhibits no tenderness.       Legs:  Skin:    General: Skin is warm and dry.     Findings: Rash present. Rash is vesicular.     Comments: Dermatomal intermittent vesicular rash from left flank/left of midline thoracic back down to left anterior abdomen.  Neurological:     Mental Status: He is alert.  Psychiatric:        Mood and Affect: Mood is not anxious.      ED Treatments / Results  Labs (all labs ordered are listed, but only abnormal results are displayed) Labs Reviewed  COMPREHENSIVE METABOLIC PANEL - Abnormal; Notable for the following components:      Result Value   Glucose, Bld 111 (*)    Total Protein 6.3 (*)    All other components within normal limits  CBC WITH DIFFERENTIAL/PLATELET - Abnormal; Notable for the following components:   Neutro Abs 1.3 (*)    All other components within normal limits    EKG EKG Interpretation  Date/Time:  Friday March 01 2019 12:05:18 EDT Ventricular Rate:  82 PR Interval:    QRS Duration: 98 QT Interval:  388 QTC Calculation: 454 R  Axis:   -67 Text Interpretation:  Sinus rhythm Inferior infarct, old Consider anterior infarct no significant change since 2018 Confirmed by Sherwood Gambler [phone]) on 03/01/2019 12:20:34 PM   Radiology No results found.  Procedures Procedures (including critical care time)  Medications Ordered in ED Medications  rabies immune globulin (HYPERAB/KEDRAB) injection 2,250 Units (has no administration in time range)  rabies vaccine (RABAVERT) injection 1 mL (has no administration in time range)  Tdap (BOOSTRIX) injection 0.5 mL (has no administration in time range)  sodium chloride 0.9 % bolus 1,000 mL (1,000 mLs Intravenous New Bag/Given 03/01/19 1247)  acetaminophen (TYLENOL) tablet 650 mg (650 mg Oral Given 03/01/19 1320)     Initial Impression / Assessment and Plan / ED Course  I have reviewed the triage vital signs and the nursing notes.  Pertinent labs & imaging results that were available during my care of the patient were reviewed by me and considered in my medical decision making (see chart for details).        The patient's flank pain is from shingles.  Otherwise his exam is unremarkable.  He has no urinary complaints and it is unclear why he had pain on his right side a week or so ago but given no further pain I do not think this needs further work-up.  He is a little hypertensive but otherwise his vitals are okay.  As for his dog bite, given unknown rabies status of the dog, will start rabies series.  He has been instructed through the interpreter he will need multiple doses.  As for his near syncope, unclear cause as well but does not have any signs or symptoms that would suggest more  insidious cause such as arrhythmia.  Will discharge home with short course of pain control as well as valacyclovir.  He has follow-up with his PCP at the end of next month.  His renal function and liver function are benign.  Final Clinical Impressions(s) / ED Diagnoses   Final diagnoses:  Herpes  zoster without complication  Dog bite of right lower leg, initial encounter    ED Discharge Orders         Ordered    valACYclovir (VALTREX) 1000 MG tablet  3 times daily     03/01/19 1319    HYDROcodone-acetaminophen (NORCO) 5-325 MG tablet  Every 4 hours PRN     03/01/19 1319           Pricilla Loveless, MD 03/01/19 1331

## 2019-03-01 NOTE — ED Notes (Signed)
Patient Alert and oriented to baseline. Stable and ambulatory to baseline. Patient verbalized understanding of the discharge instructions.  Patient belongings were taken by the patient.   

## 2019-03-04 ENCOUNTER — Emergency Department (HOSPITAL_COMMUNITY)
Admission: EM | Admit: 2019-03-04 | Discharge: 2019-03-04 | Disposition: A | Discharge status: Home / Self Care | Payer: Self-pay | Attending: Emergency Medicine | Admitting: Emergency Medicine

## 2019-03-04 ENCOUNTER — Other Ambulatory Visit: Payer: Self-pay

## 2019-03-04 ENCOUNTER — Encounter (HOSPITAL_COMMUNITY): Payer: Self-pay

## 2019-03-04 DIAGNOSIS — F0390 Unspecified dementia without behavioral disturbance: Secondary | ICD-10-CM | POA: Insufficient documentation

## 2019-03-04 DIAGNOSIS — Z203 Contact with and (suspected) exposure to rabies: Secondary | ICD-10-CM | POA: Insufficient documentation

## 2019-03-04 DIAGNOSIS — Z7982 Long term (current) use of aspirin: Secondary | ICD-10-CM | POA: Insufficient documentation

## 2019-03-04 DIAGNOSIS — I10 Essential (primary) hypertension: Secondary | ICD-10-CM | POA: Insufficient documentation

## 2019-03-04 DIAGNOSIS — Z79899 Other long term (current) drug therapy: Secondary | ICD-10-CM | POA: Insufficient documentation

## 2019-03-04 DIAGNOSIS — B029 Zoster without complications: Secondary | ICD-10-CM

## 2019-03-04 DIAGNOSIS — Z23 Encounter for immunization: Secondary | ICD-10-CM | POA: Insufficient documentation

## 2019-03-04 MED ORDER — RABIES VACCINE, PCEC IM SUSR
1.0000 mL | Freq: Once | INTRAMUSCULAR | Status: AC
  Administered 2019-03-04: 1 mL via INTRAMUSCULAR
  Filled 2019-03-04: qty 1

## 2019-03-04 MED ORDER — HYDROCODONE-ACETAMINOPHEN 5-325 MG PO TABS: 1.0000 | ORAL_TABLET | Freq: Four times a day (QID) | ORAL | 0 refills | Status: DC | PRN

## 2019-03-04 NOTE — Discharge Instructions (Signed)
Please read the attached information.  Please continue your outpatient follow-up for shingles.  Please use prescribed medication as needed for pain.  Please follow-up at [facility] urgent care for ongoing rabies vaccinations.

## 2019-03-04 NOTE — ED Triage Notes (Addendum)
Triage done using phone interpreter, pt's primary language is Arabic. Pt reports he is here for his 3rd rabies shot. Pt diagnosed with Shingles. Pt reports that he cannot sleep due to pain and burning from Shingles.

## 2019-03-04 NOTE — ED Provider Notes (Signed)
[facility] Provider [facility]   CSN: [id number] Arrival date & time: 03/04/19  1207     History   Chief Complaint Chief Complaint  Patient presents with  . Rabies Injection    HPI Brandon Clark is a 78 y.o. male.     HPI   78 year old male presents today for rabies injection.  Patient notes that 3 days ago he was bit by a dog in his right calf.  He was seen in the emergency room and started on rabies vaccinations.  He notes he is here for his second vaccination.  Patient also notes he is having shingles on his left abdomen and flank.  He was started on Valtrex and has been taking medicine.  He would like something for pain.  Past Medical History:  Diagnosis Date  . Acid reflux   . BPH (benign prostatic hyperplasia)   . Hiatal hernia   . Hyperlipidemia   . Hypertension   . Pre-diabetes   . Sleep apnea    sometimes does not use d/t poor fitting of the mask  . Ulcer     Patient Active Problem List   Diagnosis Date Noted  . S/P Nissen fundoplication (without gastrostomy tube) procedure 06/02/2017  . Dementia (HCC) 05/12/2017  . Prediabetes 01/30/2017  . Hiatal hernia 01/27/2017  . Acid reflux 02/12/2016  . Sleep apnea 02/12/2016  . Diastasis recti 03/02/2015  . Umbilical hernia 09/16/2014  . BPV (benign positional vertigo) 07/01/2014  . HTN (hypertension) 07/01/2014  . Panic attack   . Encephalopathy, hypertensive   . Ataxia 06/30/2014  . Vertigo 06/30/2014  . Dyspepsia 04/03/2014  . Encounter for colorectal cancer screening 04/03/2014  . Bilateral shoulder pain 09/17/2013  . Bilateral knee pain 02/05/2013    Past Surgical History:  Procedure Laterality Date  . ESOPHAGEAL MANOMETRY N/A 04/27/2016   Procedure: ESOPHAGEAL MANOMETRY (EM) with PH;  Surgeon: Midge Miniumarren Wohl, MD;  Location: [facility] ENDOSCOPY;  Service: Endoscopy;  Laterality: N/A;  . ESOPHAGEAL MANOMETRY N/A 04/19/2017   Procedure: ESOPHAGEAL MANOMETRY (EM);   Surgeon: Charlott RakesSchooler, Vincent, MD;  Location: [facility] ENDOSCOPY;  Service: Endoscopy;  Laterality: N/A;  . FACIAL LACERATIONS REPAIR    . INSERTION OF MESH N/A 06/02/2017   Procedure: INSERTION OF MESH;  Surgeon: Axel Filleramirez, Armando, MD;  Location: [facility] ORS;  Service: General;  Laterality: N/A;        Home Medications    Prior to Admission medications   Medication Sig Start Date End Date Taking? Authorizing Provider  acetaminophen (TYLENOL) 500 MG tablet Take 500-1,000 mg by mouth every 6 (six) hours as needed.    [provider]  aspirin EC 325 MG tablet Take 1 tablet (325 mg total) by mouth daily. 06/09/17   Everlene Farrieransie, William, PA-C  donepezil (ARICEPT) 10 MG tablet Take 1 tablet (10 mg total) by mouth at bedtime. 06/12/18   Hoy RegisterNewlin, Enobong, MD  hydrochlorothiazide (HYDRODIURIL) 12.5 MG tablet Take 1 tablet (12.5 mg total) by mouth daily. 06/12/18   Hoy RegisterNewlin, Enobong, MD  HYDROcodone-acetaminophen (NORCO/VICODIN) 5-325 MG tablet Take 1 tablet by mouth every 6 (six) hours as needed. 03/04/19   Jazsmin Couse, Tinnie GensJeffrey, PA-C  lactulose (CHRONULAC) 10 GM/15ML solution Take 15 mLs (10 g total) by mouth 2 (two) times daily as needed for mild constipation. 03/22/16   Hoy RegisterNewlin, Enobong, MD  meclizine (ANTIVERT) 25 MG tablet Take 1 tablet (25 mg total) by mouth 2 (two) times daily as needed. 08/31/17   Hoy RegisterNewlin, Enobong, MD  meloxicam (MOBIC) 7.5  MG tablet Take 1 tablet (7.5 mg total) by mouth daily. 08/31/17   Charlott Rakes, MD  methocarbamol (ROBAXIN) 500 MG tablet Take 500 mg by mouth every 8 (eight) hours as needed for muscle spasms. 05/13/16   [provider]  simvastatin (ZOCOR) 10 MG tablet Take 1 tablet (10 mg total) by mouth at bedtime. 06/12/18   Charlott Rakes, MD  tamsulosin (FLOMAX) 0.4 MG CAPS capsule Take 1 capsule (0.4 mg total) by mouth daily. 06/12/18   Charlott Rakes, MD  traMADol (ULTRAM) 50 MG tablet Take 1 tablet (50 mg total) by mouth every 12 (twelve) hours as needed. 07/26/17   Meredith Pel, MD  valACYclovir (VALTREX) 1000 MG tablet Take 1 tablet (1,000 mg total) by mouth 3 (three) times daily. 03/01/19   Sherwood Gambler, MD  zolpidem (AMBIEN) 5 MG tablet Take 1 tablet (5 mg total) by mouth at bedtime as needed for sleep. 06/12/18   Charlott Rakes, MD    Family History Family History  Problem Relation Age of Onset  . Diabetes Mother   . Diabetes Brother   . Heart attack Neg Hx   . Hyperlipidemia Neg Hx   . Hypertension Neg Hx   . Sudden death Neg Hx     Social History Social History   Tobacco Use  . Smoking status: Never Smoker  . Smokeless tobacco: Never Used  Substance Use Topics  . Alcohol use: No    Alcohol/week: 0.0 standard drinks  . Drug use: No     Allergies   Patient has no known allergies.   Review of Systems Review of Systems  All other systems reviewed and are negative.    Physical Exam Updated Vital Signs BP (!) 118/96 (BP Location: Left Arm)   Pulse 86   Temp 97.9 F (36.6 C) (Oral)   Resp 16   SpO2 96%   Physical Exam Vitals signs and nursing note reviewed.  Constitutional:      Appearance: He is well-developed.  HENT:     Head: Normocephalic and atraumatic.  Eyes:     General: No scleral icterus.       Right eye: No discharge.        Left eye: No discharge.     Conjunctiva/sclera: Conjunctivae normal.     Pupils: Pupils are equal, round, and reactive to light.  Neck:     Musculoskeletal: Normal range of motion.     Vascular: No JVD.     Trachea: No tracheal deviation.  Pulmonary:     Effort: Pulmonary effort is normal.     Breath sounds: No stridor.  Musculoskeletal:     Comments: Right calf with wound with no surrounding redness or discharge no swelling or edema  Skin:    Comments: Vesicular rash with crusting noted to the left abdomen flank and back  Neurological:     Mental Status: He is alert and oriented to person, place, and time.     Coordination: Coordination normal.  Psychiatric:         Behavior: Behavior normal.        Thought Content: Thought content normal.        Judgment: Judgment normal.      ED Treatments / Results  Labs (all labs ordered are listed, but only abnormal results are displayed) Labs Reviewed - No data to display  EKG None  Radiology No results found.  Procedures Procedures (including critical care time)  Medications Ordered in ED Medications  rabies vaccine (  RABAVERT) injection 1 mL (1 mL Intramuscular Given 03/04/19 1514)     Initial Impression / Assessment and Plan / ED Course  I have reviewed the triage vital signs and the nursing notes.  Pertinent labs & imaging results that were available during my care of the patient were reviewed by me and considered in my medical decision making (see chart for details).        Patient here for rabies vaccine he was given a second dose here.  Patient also having shingles, is currently on Valtrex.  Will encourage him to continue using this.  He has no signs of infection at the dog bite.  Patient will follow-up in urgent care for ongoing evaluation and management.  Translator used for evaluation   Final Clinical Impressions(s) / ED Diagnoses   Final diagnoses:  Need for rabies vaccination  Herpes zoster without complication    ED Discharge Orders         Ordered    HYDROcodone-acetaminophen (NORCO/VICODIN) 5-325 MG tablet  Every 6 hours PRN     03/04/19 1534           Eyvonne Mechanic, PA-C 03/05/19 1148    Tegeler, Canary Brim, MD 03/05/19 1740

## 2019-03-08 ENCOUNTER — Other Ambulatory Visit: Payer: Self-pay

## 2019-03-08 ENCOUNTER — Ambulatory Visit (HOSPITAL_COMMUNITY)
Admission: EM | Admit: 2019-03-08 | Discharge: 2019-03-08 | Disposition: A | Discharge status: Home / Self Care | Payer: Self-pay | Attending: Family Medicine | Admitting: Family Medicine

## 2019-03-08 DIAGNOSIS — Z23 Encounter for immunization: Secondary | ICD-10-CM

## 2019-03-08 DIAGNOSIS — Z203 Contact with and (suspected) exposure to rabies: Secondary | ICD-10-CM

## 2019-03-08 MED ORDER — RABIES VACCINE, PCEC IM SUSR
1.0000 mL | Freq: Once | INTRAMUSCULAR | Status: AC
  Administered 2019-03-08: 1 mL via INTRAMUSCULAR

## 2019-03-08 MED ORDER — RABIES VACCINE, PCEC IM SUSR
INTRAMUSCULAR | Status: AC
  Filled 2019-03-08: qty 1

## 2019-03-08 NOTE — ED Notes (Signed)
Rabies injection given in left deltoid.

## 2019-03-08 NOTE — ED Triage Notes (Signed)
Pt here for day 7 rabies injection.  

## 2019-03-13 NOTE — Progress Notes (Deleted)
Patient ID: Brandon Clark, male   DOB: October 31, 1940, 78 y.o.   MRN: [id number]  Seen recently in [place] for shingles and for dog bite/treatment for possible rabies exposure.  Labs essentially normal.

## 2019-03-14 ENCOUNTER — Inpatient Hospital Stay: Payer: Self-pay

## 2019-03-15 ENCOUNTER — Ambulatory Visit (HOSPITAL_COMMUNITY)
Admission: EM | Admit: 2019-03-15 | Discharge: 2019-03-15 | Disposition: A | Discharge status: Home / Self Care | Payer: Self-pay | Attending: Emergency Medicine | Admitting: Emergency Medicine

## 2019-03-15 ENCOUNTER — Encounter (HOSPITAL_COMMUNITY): Payer: Self-pay | Admitting: Emergency Medicine

## 2019-03-15 DIAGNOSIS — Z23 Encounter for immunization: Secondary | ICD-10-CM

## 2019-03-15 DIAGNOSIS — Z203 Contact with and (suspected) exposure to rabies: Secondary | ICD-10-CM

## 2019-03-15 DIAGNOSIS — B029 Zoster without complications: Secondary | ICD-10-CM

## 2019-03-15 DIAGNOSIS — Z09 Encounter for follow-up examination after completed treatment for conditions other than malignant neoplasm: Secondary | ICD-10-CM

## 2019-03-15 MED ORDER — RABIES VACCINE, PCEC IM SUSR
1.0000 mL | Freq: Once | INTRAMUSCULAR | Status: AC
  Administered 2019-03-15: 1 mL via INTRAMUSCULAR

## 2019-03-15 MED ORDER — RABIES VACCINE, PCEC IM SUSR
INTRAMUSCULAR | Status: AC
  Filled 2019-03-15: qty 1

## 2019-03-15 NOTE — ED Triage Notes (Addendum)
Pt here for day 14 rabbies injection and concern for continued issues with shingles.  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

## 2019-03-16 NOTE — ED Provider Notes (Signed)
[facility]    CSN: [id number] Arrival date & time: 03/15/19  1435      History   Chief Complaint Chief Complaint  Patient presents with  . Herpes Zoster  . Rabies Injection    HPI Brandon Clark is a 78 y.o. male.   Brandon Clark presents for final rabies series and follow up shingles. He was treated with valtrex for shingles. Rash has been improving, now resolving and only a few scabbed lesions remain. Some burning still but improving. No new complaints. History  Of acid reflux, bph, htn, pre diabetes.    ROS per HPI, negative if not otherwise mentioned.      Past Medical History:  Diagnosis Date  . Acid reflux   . BPH (benign prostatic hyperplasia)   . Hiatal hernia   . Hyperlipidemia   . Hypertension   . Pre-diabetes   . Sleep apnea    sometimes does not use d/t poor fitting of the mask  . Ulcer     Patient Active Problem List   Diagnosis Date Noted  . S/P Nissen fundoplication (without gastrostomy tube) procedure 06/02/2017  . Dementia ([facility]) 05/12/2017  . Prediabetes 01/30/2017  . Hiatal hernia 01/27/2017  . Acid reflux 02/12/2016  . Sleep apnea 02/12/2016  . Diastasis recti 03/02/2015  . Umbilical hernia 09/16/2014  . BPV (benign positional vertigo) 07/01/2014  . HTN (hypertension) 07/01/2014  . Panic attack   . Encephalopathy, hypertensive   . Ataxia 06/30/2014  . Vertigo 06/30/2014  . Dyspepsia 04/03/2014  . Encounter for colorectal cancer screening 04/03/2014  . Bilateral shoulder pain 09/17/2013  . Bilateral knee pain 02/05/2013    Past Surgical History:  Procedure Laterality Date  . ESOPHAGEAL MANOMETRY N/A 04/27/2016   Procedure: ESOPHAGEAL MANOMETRY (EM) with PH;  Surgeon: Midge Miniumarren Wohl, MD;  Location: [facility];  Service: Endoscopy;  Laterality: N/A;  . ESOPHAGEAL MANOMETRY N/A 04/19/2017   Procedure: ESOPHAGEAL MANOMETRY (EM);  Surgeon: Charlott RakesSchooler, Vincent, MD;  Location: [facility];  Service: Endoscopy;   Laterality: N/A;  . FACIAL LACERATIONS REPAIR    . INSERTION OF MESH N/A 06/02/2017   Procedure: INSERTION OF MESH;  Surgeon: Axel Filleramirez, Armando, MD;  Location: [facility];  Service: General;  Laterality: N/A;       Home Medications    Prior to Admission medications   Medication Sig Start Date End Date Taking? Authorizing Provider  acetaminophen (TYLENOL) 500 MG tablet Take 500-1,000 mg by mouth every 6 (six) hours as needed.    [provider]  aspirin EC 325 MG tablet Take 1 tablet (325 mg total) by mouth daily. 06/09/17   Everlene Farrieransie, William, PA-C  donepezil (ARICEPT) 10 MG tablet Take 1 tablet (10 mg total) by mouth at bedtime. 06/12/18   Hoy RegisterNewlin, Enobong, MD  hydrochlorothiazide (HYDRODIURIL) 12.5 MG tablet Take 1 tablet (12.5 mg total) by mouth daily. 06/12/18   Hoy RegisterNewlin, Enobong, MD  HYDROcodone-acetaminophen (NORCO/VICODIN) 5-325 MG tablet Take 1 tablet by mouth every 6 (six) hours as needed. 03/04/19   Hedges, Tinnie GensJeffrey, PA-C  lactulose (CHRONULAC) 10 GM/15ML solution Take 15 mLs (10 g total) by mouth 2 (two) times daily as needed for mild constipation. 03/22/16   Hoy RegisterNewlin, Enobong, MD  meclizine (ANTIVERT) 25 MG tablet Take 1 tablet (25 mg total) by mouth 2 (two) times daily as needed. 08/31/17   Hoy RegisterNewlin, Enobong, MD  meloxicam (MOBIC) 7.5 MG tablet Take 1 tablet (7.5 mg total) by mouth daily. 08/31/17   Hoy RegisterNewlin, Enobong, MD  methocarbamol (ROBAXIN) 500  MG tablet Take 500 mg by mouth every 8 (eight) hours as needed for muscle spasms. 05/13/16   [provider]  simvastatin (ZOCOR) 10 MG tablet Take 1 tablet (10 mg total) by mouth at bedtime. 06/12/18   Hoy Register, MD  tamsulosin (FLOMAX) 0.4 MG CAPS capsule Take 1 capsule (0.4 mg total) by mouth daily. 06/12/18   Hoy Register, MD  traMADol (ULTRAM) 50 MG tablet Take 1 tablet (50 mg total) by mouth every 12 (twelve) hours as needed. 07/26/17   Cammy Copa, MD  valACYclovir (VALTREX) 1000 MG tablet Take 1 tablet (1,000 mg  total) by mouth 3 (three) times daily. 03/01/19   Pricilla Loveless, MD  zolpidem (AMBIEN) 5 MG tablet Take 1 tablet (5 mg total) by mouth at bedtime as needed for sleep. 06/12/18   Hoy Register, MD    Family History Family History  Problem Relation Age of Onset  . Diabetes Mother   . Diabetes Brother   . Heart attack Neg Hx   . Hyperlipidemia Neg Hx   . Hypertension Neg Hx   . Sudden death Neg Hx     Social History Social History   Tobacco Use  . Smoking status: Never Smoker  . Smokeless tobacco: Never Used  Substance Use Topics  . Alcohol use: No    Alcohol/week: 0.0 standard drinks  . Drug use: No     Allergies   Patient has no known allergies.   Review of Systems Review of Systems   Physical Exam Triage Vital Signs ED Triage Vitals  Enc Vitals Group     BP 03/15/19 1537 (!) 130/97     Pulse Rate 03/15/19 1537 68     Resp 03/15/19 1537 12     Temp 03/15/19 1537 97.6 F (36.4 C)     Temp Source 03/15/19 1537 Temporal     SpO2 03/15/19 1537 97 %     Weight --      Height --      Head Circumference --      Peak Flow --      Pain Score 03/15/19 1639 0     Pain Loc --      Pain Edu? --      Excl. in GC? --    No data found.  Updated Vital Signs BP (!) 130/97 (BP Location: Right Arm)   Pulse 68   Temp 97.6 F (36.4 C) (Temporal)   Resp 12   SpO2 97%   Visual Acuity Right Eye Distance:   Left Eye Distance:   Bilateral Distance:    Right Eye Near:   Left Eye Near:    Bilateral Near:     Physical Exam Constitutional:      Appearance: He is well-developed.  Cardiovascular:     Rate and Rhythm: Normal rate.  Pulmonary:     Effort: Pulmonary effort is normal.  Skin:    General: Skin is warm and dry.          Comments: Healing rash to left trunk, a few scabbed lesions, some hyperpigmented scarring from healed lesions   Neurological:     Mental Status: He is alert and oriented to person, place, and time.      UC Treatments / Results   Labs (all labs ordered are listed, but only abnormal results are displayed) Labs Reviewed - No data to display  EKG   Radiology No results found.  Procedures Procedures (including critical care time)  Medications Ordered in  UC Medications  rabies vaccine (RABAVERT) injection 1 mL (1 mL Intramuscular Given 03/15/19 1630)  rabies vaccine (RABAVERT) injection (has no administration in time range)    Initial Impression / Assessment and Plan / UC Course  I have reviewed the triage vital signs and the nursing notes.  Pertinent labs & imaging results that were available during my care of the patient were reviewed by me and considered in my medical decision making (see chart for details).     Rabies vaccine given, patient shingles improving without new complaints at this time Final Clinical Impressions(s) / UC Diagnoses   Final diagnoses:  Encounter for repeat administration of rabies vaccination  Contact with or exposure to rabies   Discharge Instructions   None    ED Prescriptions    None     Controlled Substance Prescriptions [place] Controlled Substance Registry consulted? Not Applicable   Zigmund Gottron, NP 03/16/19 [phone]

## 2019-03-18 ENCOUNTER — Telehealth (HOSPITAL_COMMUNITY): Payer: Self-pay

## 2019-03-22 ENCOUNTER — Telehealth: Payer: Self-pay | Admitting: Family Medicine

## 2019-03-22 ENCOUNTER — Other Ambulatory Visit: Payer: Self-pay

## 2019-03-22 ENCOUNTER — Ambulatory Visit: Payer: Self-pay | Attending: Family Medicine

## 2019-03-22 NOTE — Telephone Encounter (Signed)
2nd call, Pt did not asnwer the call with interpreter in the line, PT has to call back to reschedule and submited an application ( that will be mail back ) and the papers that need to have to possible get approve, LVM

## 2019-03-22 NOTE — Telephone Encounter (Signed)
I called the Pt since he has a 2 pm tele visit with financial LVM in the cell and home number I will try again at 2;15 if he does not answer Pt will be sent back the application

## 2019-03-22 NOTE — Telephone Encounter (Signed)
I call Pt to remind him that he has a 2pm tele visit with financial LVM, with interpreter

## 2019-03-25 ENCOUNTER — Ambulatory Visit: Payer: Self-pay | Admitting: Family Medicine

## 2019-04-12 MED FILL — HYDROCHLOROTHIAZIDE 12.5 MG: 12.5 | 30 days supply | Qty: 30 | Fill #2

## 2019-04-12 MED FILL — TAMSULOSIN HCL 0.4 MG CAP: 0.4 | 30 days supply | Qty: 30 | Fill #2

## 2019-05-08 MED FILL — TAMSULOSIN HCL 0.4 MG CAP: 0.4 | 30 days supply | Qty: 30 | Fill #3

## 2019-05-08 MED FILL — HYDROCHLOROTHIAZIDE 12.5 MG: 12.5 | 30 days supply | Qty: 30 | Fill #3

## 2019-06-03 MED FILL — HYDROCHLOROTHIAZIDE 12.5 MG: 12.5 | 30 days supply | Qty: 30 | Fill #4

## 2019-06-03 MED FILL — TAMSULOSIN HCL 0.4 MG CAP: 0.4 | 30 days supply | Qty: 30 | Fill #4

## 2019-07-03 ENCOUNTER — Other Ambulatory Visit: Payer: Self-pay | Admitting: Family Medicine

## 2019-07-03 DIAGNOSIS — I10 Essential (primary) hypertension: Secondary | ICD-10-CM

## 2019-07-03 DIAGNOSIS — R399 Unspecified symptoms and signs involving the genitourinary system: Secondary | ICD-10-CM

## 2019-08-07 ENCOUNTER — Ambulatory Visit: Payer: Self-pay

## 2019-08-07 ENCOUNTER — Ambulatory Visit (INDEPENDENT_AMBULATORY_CARE_PROVIDER_SITE_OTHER): Payer: Medicaid Other

## 2019-08-07 ENCOUNTER — Other Ambulatory Visit: Payer: Self-pay

## 2019-08-07 ENCOUNTER — Ambulatory Visit: Payer: Medicaid Other | Admitting: Orthopedic Surgery

## 2019-08-07 DIAGNOSIS — M170 Bilateral primary osteoarthritis of knee: Secondary | ICD-10-CM | POA: Diagnosis not present

## 2019-08-07 DIAGNOSIS — M25562 Pain in left knee: Secondary | ICD-10-CM | POA: Diagnosis not present

## 2019-08-07 DIAGNOSIS — M25561 Pain in right knee: Secondary | ICD-10-CM

## 2019-08-07 MED ORDER — MELOXICAM 15 MG PO TABS: 15.0000 mg | ORAL_TABLET | Freq: Every day | ORAL | 0 refills | Status: DC

## 2019-08-08 ENCOUNTER — Encounter: Payer: Self-pay | Admitting: Orthopedic Surgery

## 2019-08-08 DIAGNOSIS — M170 Bilateral primary osteoarthritis of knee: Secondary | ICD-10-CM

## 2019-08-08 DIAGNOSIS — M25562 Pain in left knee: Secondary | ICD-10-CM | POA: Diagnosis not present

## 2019-08-08 DIAGNOSIS — M25561 Pain in right knee: Secondary | ICD-10-CM | POA: Diagnosis not present

## 2019-08-08 MED ORDER — LIDOCAINE HCL 1 % IJ SOLN
5.0000 mL | INTRAMUSCULAR | Status: AC | PRN
  Administered 2019-08-08: 5 mL

## 2019-08-08 MED ORDER — BUPIVACAINE HCL 0.25 % IJ SOLN
4.0000 mL | INTRAMUSCULAR | Status: AC | PRN
  Administered 2019-08-08: 4 mL via INTRA_ARTICULAR

## 2019-08-08 MED ORDER — METHYLPREDNISOLONE ACETATE 40 MG/ML IJ SUSP
40.0000 mg | INTRAMUSCULAR | Status: AC | PRN
  Administered 2019-08-08: 40 mg via INTRA_ARTICULAR

## 2019-08-08 NOTE — Progress Notes (Signed)
Office Visit Note   Patient: Brandon Clark           Date of Birth: 02-Feb-1941           MRN: [id number] Visit Date: 08/07/2019 Requested by: Hoy Register, MD [address] [place],  [place] [address] PCP: Hoy Register, MD  Subjective: Chief Complaint  Patient presents with  . Right Knee - Pain  . Left Knee - Pain    HPI: Patient presents for evaluation of bilateral knee pain.  Reports knee pain and swelling.  Here with a Nurse, learning disability.  Denies any interval history of injury.  Does not report much in the way of mechanical symptoms but just pain which is worse in the left knee compared to the right.  Taking over-the-counter medication.              ROS: All systems reviewed are negative as they relate to the chief complaint within the history of present illness.  Patient denies  fevers or chills.   Assessment & Plan: Visit Diagnoses:  1. Pain in both knees, unspecified chronicity   2. Primary osteoarthritis of both knees     Plan: Impression is bilateral knee arthritis which appears mild radiographically.  Does have an effusion in the left knee no effusion in the right knee.  Has done well with injections in the past and has not had an injection in about a year.  We will try bilateral knee injections today with cortisone as well as aspiration.  Continue with nonweightbearing quad strengthening exercises and follow-up as needed.  Follow-Up Instructions: Return if symptoms worsen or fail to improve.   Orders:  Orders Placed This Encounter  Procedures  . XR Knee 1-2 Views Right  . XR KNEE 3 VIEW LEFT   Meds ordered this encounter  Medications  . DISCONTD: meloxicam (MOBIC) 15 MG tablet    Sig: Take 1 tablet (15 mg total) by mouth daily.    Dispense:  30 tablet    Refill:  0  . meloxicam (MOBIC) 15 MG tablet    Sig: Take 1 tablet (15 mg total) by mouth daily.    Dispense:  30 tablet    Refill:  0      Procedures: Large Joint Inj: bilateral knee on  08/08/2019 7:36 AM Indications: diagnostic evaluation, joint swelling and pain Details: 18 G 1.5 in needle, superolateral approach  Arthrogram: No  Medications (Right): 5 mL lidocaine 1 %; 4 mL bupivacaine 0.25 %; 40 mg methylPREDNISolone acetate 40 MG/ML Medications (Left): 5 mL lidocaine 1 %; 4 mL bupivacaine 0.25 %; 40 mg methylPREDNISolone acetate 40 MG/ML Outcome: tolerated well, no immediate complications Procedure, treatment alternatives, risks and benefits explained, specific risks discussed. Consent was given by the patient. Immediately prior to procedure a time out was called to verify the correct patient, procedure, equipment, support staff and site/side marked as required. Patient was prepped and draped in the usual sterile fashion.       Clinical Data: No additional findings.  Objective: Vital Signs: There were no vitals taken for this visit.  Physical Exam:   Constitutional: Patient appears well-developed HEENT:  Head: Normocephalic Eyes:EOM are normal Neck: Normal range of motion Cardiovascular: Normal rate Pulmonary/chest: Effort normal Neurologic: Patient is alert Skin: Skin is warm Psychiatric: Patient has normal mood and affect    Ortho Exam: Ortho exam demonstrates slight varus alignment bilaterally.  Pedal pulses intact bilaterally with no pitting edema in the feet.  Mild effusion left knee  no effusion right knee.  Collateral and cruciate ligaments are stable.  No other masses lymphadenopathy or skin changes noted in the knee region.  Patient has mild patellofemoral crepitus bilaterally without patellar apprehension.  Specialty Comments:  No specialty comments available.  Imaging: XR Knee 1-2 Views Right  Result Date: 08/08/2019 AP lateral merchant right knee reviewed.  Mild degenerative arthritis is noted medial lateral and patellofemoral compartments.  Appears worse in the patellofemoral compartment.  Alignment slight varus.  No loose bodies.  XR KNEE  3 VIEW LEFT  Result Date: 08/08/2019 AP lateral merchant left knee reviewed.  Mild degenerative changes noted in the medial lateral and patellofemoral compartments worse in the medial compartment with mild spurring present.  Alignment slight varus.  No loose body present.  Patella height normal in relation to the distal femur.    PMFS History: Patient Active Problem List   Diagnosis Date Noted  . S/P Nissen fundoplication (without gastrostomy tube) procedure 06/02/2017  . Dementia ([place]) 05/12/2017  . Prediabetes 01/30/2017  . Hiatal hernia 01/27/2017  . Acid reflux 02/12/2016  . Sleep apnea 02/12/2016  . Diastasis recti 03/02/2015  . Umbilical hernia 94/85/4627  . BPV (benign positional vertigo) 07/01/2014  . HTN (hypertension) 07/01/2014  . Panic attack   . Encephalopathy, hypertensive   . Ataxia 06/30/2014  . Vertigo 06/30/2014  . Dyspepsia 04/03/2014  . Encounter for colorectal cancer screening 04/03/2014  . Bilateral shoulder pain 09/17/2013  . Bilateral knee pain 02/05/2013   Past Medical History:  Diagnosis Date  . Acid reflux   . BPH (benign prostatic hyperplasia)   . Hiatal hernia   . Hyperlipidemia   . Hypertension   . Pre-diabetes   . Sleep apnea    sometimes does not use d/t poor fitting of the mask  . Ulcer     Family History  Problem Relation Age of Onset  . Diabetes Mother   . Diabetes Brother   . Heart attack Neg Hx   . Hyperlipidemia Neg Hx   . Hypertension Neg Hx   . Sudden death Neg Hx     Past Surgical History:  Procedure Laterality Date  . ESOPHAGEAL MANOMETRY N/A 04/27/2016   Procedure: ESOPHAGEAL MANOMETRY (EM) with [place];  Surgeon: Lucilla Lame, MD;  Location: [facility];  Service: Endoscopy;  Laterality: N/A;  . ESOPHAGEAL MANOMETRY N/A 04/19/2017   Procedure: ESOPHAGEAL MANOMETRY (EM);  Surgeon: Wilford Corner, MD;  Location: [facility];  Service: Endoscopy;  Laterality: N/A;  . FACIAL LACERATIONS REPAIR    . INSERTION OF MESH N/A  06/02/2017   Procedure: INSERTION OF MESH;  Surgeon: Ralene Ok, MD;  Location: [facility];  Service: General;  Laterality: N/A;   Social History   Occupational History  . Occupation: Chief Financial Officer  Tobacco Use  . Smoking status: Never Smoker  . Smokeless tobacco: Never Used  Substance and Sexual Activity  . Alcohol use: No    Alcohol/week: 0.0 standard drinks  . Drug use: No  . Sexual activity: Never

## 2019-08-20 ENCOUNTER — Ambulatory Visit: Payer: Medicaid Other | Admitting: Family Medicine

## 2019-09-11 ENCOUNTER — Ambulatory Visit: Payer: Self-pay

## 2019-09-11 ENCOUNTER — Other Ambulatory Visit: Payer: Self-pay

## 2019-09-11 ENCOUNTER — Ambulatory Visit (INDEPENDENT_AMBULATORY_CARE_PROVIDER_SITE_OTHER): Payer: Medicaid Other

## 2019-09-11 ENCOUNTER — Ambulatory Visit (INDEPENDENT_AMBULATORY_CARE_PROVIDER_SITE_OTHER): Payer: Medicaid Other | Admitting: Orthopedic Surgery

## 2019-09-11 ENCOUNTER — Encounter: Payer: Self-pay | Admitting: Orthopedic Surgery

## 2019-09-11 DIAGNOSIS — M25512 Pain in left shoulder: Secondary | ICD-10-CM

## 2019-09-11 DIAGNOSIS — M7542 Impingement syndrome of left shoulder: Secondary | ICD-10-CM | POA: Diagnosis not present

## 2019-09-11 DIAGNOSIS — M25572 Pain in left ankle and joints of left foot: Secondary | ICD-10-CM | POA: Diagnosis not present

## 2019-09-11 DIAGNOSIS — M25511 Pain in right shoulder: Secondary | ICD-10-CM

## 2019-09-11 DIAGNOSIS — M7541 Impingement syndrome of right shoulder: Secondary | ICD-10-CM

## 2019-09-12 ENCOUNTER — Encounter: Payer: Self-pay | Admitting: Orthopedic Surgery

## 2019-09-12 DIAGNOSIS — M7541 Impingement syndrome of right shoulder: Secondary | ICD-10-CM | POA: Diagnosis not present

## 2019-09-12 DIAGNOSIS — M25512 Pain in left shoulder: Secondary | ICD-10-CM | POA: Diagnosis not present

## 2019-09-12 DIAGNOSIS — M25511 Pain in right shoulder: Secondary | ICD-10-CM | POA: Diagnosis not present

## 2019-09-12 DIAGNOSIS — M7542 Impingement syndrome of left shoulder: Secondary | ICD-10-CM | POA: Diagnosis not present

## 2019-09-12 DIAGNOSIS — M25572 Pain in left ankle and joints of left foot: Secondary | ICD-10-CM | POA: Diagnosis not present

## 2019-09-12 MED ORDER — BUPIVACAINE HCL 0.5 % IJ SOLN
9.0000 mL | INTRAMUSCULAR | Status: AC | PRN
  Administered 2019-09-12: 21:00:00 9 mL via INTRA_ARTICULAR

## 2019-09-12 MED ORDER — LIDOCAINE HCL 1 % IJ SOLN
5.0000 mL | INTRAMUSCULAR | Status: AC | PRN
  Administered 2019-09-12: 21:00:00 5 mL

## 2019-09-12 MED ORDER — METHYLPREDNISOLONE ACETATE 40 MG/ML IJ SUSP
40.0000 mg | INTRAMUSCULAR | Status: AC | PRN
  Administered 2019-09-12: 40 mg via INTRA_ARTICULAR

## 2019-09-12 MED ORDER — METHYLPREDNISOLONE ACETATE 40 MG/ML IJ SUSP
40.0000 mg | INTRAMUSCULAR | Status: AC | PRN
  Administered 2019-09-12: 21:00:00 40 mg via INTRA_ARTICULAR

## 2019-09-12 NOTE — Progress Notes (Signed)
Office Visit Note   Patient: Brandon Clark           Date of Birth: 10-17-1940           MRN: [id number] Visit Date: 09/11/2019 Requested by: Hoy Register, MD [address] [place],  [place] [address] PCP: Hoy Register, MD  Subjective: Chief Complaint  Patient presents with  . Left Shoulder - Pain  . Right Shoulder - Pain  . Left Ankle - Pain    HPI: Patient presents with bilateral shoulder pain right worse than left.  Also describes left ankle pain.  Shoulder pain is relatively constant.  He states that he has had shoulder injections in the past and the last one lasted about 1-1/2 years.  He also describes relatively recent onset of atraumatic onset medial sided ankle pain on the left-hand side.  Denies any injury.  Denies any mechanical symptoms but just reports pain.  He does not have any type of insert that he wears in his shoes.  Denies any history of gout.  Here with translator today.              ROS: All systems reviewed are negative as they relate to the chief complaint within the history of present illness.  Patient denies  fevers or chills.   Assessment & Plan: Visit Diagnoses:  1. Bilateral shoulder pain, unspecified chronicity   2. Pain in left ankle and joints of left foot     Plan: Impression is bilateral shoulder impingement bursitis without much weakness or radiographic abnormality.  No discrete tenderness on the [facility] joint and no coarse grinding with exam.  I think since he did so well with injections before we will repeat them today.  The left ankle is a little bit tougher to decipher.  Radiographs normal.  Not much swelling or synovitis present.  Does have flat feet.  Could be an ankle joint problem versus posterior tib tendinitis.  I would favor ankle injection under ultrasound in 1 week.  He will come back for that scheduled injection at that time.  Do not really want to do 3 injections in 1 day.  Follow-Up Instructions: Return in about 1 week  (around 09/18/2019).   Orders:  Orders Placed This Encounter  Procedures  . XR Ankle Complete Left  . XR Shoulder Left  . XR Shoulder Right   No orders of the defined types were placed in this encounter.     Procedures: Large Joint Inj: bilateral subacromial bursa on 09/12/2019 8:48 PM Indications: diagnostic evaluation and pain Details: 18 G 1.5 in needle, posterior approach  Arthrogram: No  Medications (Right): 5 mL lidocaine 1 %; 40 mg methylPREDNISolone acetate 40 MG/ML; 9 mL bupivacaine 0.5 % Medications (Left): 5 mL lidocaine 1 %; 40 mg methylPREDNISolone acetate 40 MG/ML; 9 mL bupivacaine 0.5 % Outcome: tolerated well, no immediate complications Procedure, treatment alternatives, risks and benefits explained, specific risks discussed. Consent was given by the patient. Immediately prior to procedure a time out was called to verify the correct patient, procedure, equipment, support staff and site/side marked as required. Patient was prepped and draped in the usual sterile fashion.       Clinical Data: No additional findings.  Objective: Vital Signs: There were no vitals taken for this visit.  Physical Exam:   Constitutional: Patient appears well-developed HEENT:  Head: Normocephalic Eyes:EOM are normal Neck: Normal range of motion Cardiovascular: Normal rate Pulmonary/chest: Effort normal Neurologic: Patient is alert Skin: Skin is warm  Psychiatric: Patient has normal mood and affect    Ortho Exam: Ortho exam demonstrates good cervical spine range of motion.  No paresthesias in the arms.  Radial pulse intact bilaterally.  Patient has good active and passive range of motion of the shoulder without restriction of external rotation of 15 degrees of abduction.  Radial pulse intact bilaterally.  No coarse grinding or crepitus with internal extra rotation of the arm at neutral or at 90 degrees of abduction.  Equivocal impingement signs bilaterally.  Negative apprehension  relocation testing.  Negative O'Brien's testing bilaterally.  Left ankle demonstrates full active and passive range of motion with no discrete tenderness over the anterior tib posterior to peroneal or Achilles tendons.  Pedal pulses palpable.  No masses lymphadenopathy or skin changes noted in that left ankle region.  Specialty Comments:  No specialty comments available.  Imaging: No results found.   PMFS History: Patient Active Problem List   Diagnosis Date Noted  . S/P Nissen fundoplication (without gastrostomy tube) procedure 06/02/2017  . Dementia ([place]) 05/12/2017  . Prediabetes 01/30/2017  . Hiatal hernia 01/27/2017  . Acid reflux 02/12/2016  . Sleep apnea 02/12/2016  . Diastasis recti 03/02/2015  . Umbilical hernia 45/80/9983  . BPV (benign positional vertigo) 07/01/2014  . HTN (hypertension) 07/01/2014  . Panic attack   . Encephalopathy, hypertensive   . Ataxia 06/30/2014  . Vertigo 06/30/2014  . Dyspepsia 04/03/2014  . Encounter for colorectal cancer screening 04/03/2014  . Bilateral shoulder pain 09/17/2013  . Bilateral knee pain 02/05/2013   Past Medical History:  Diagnosis Date  . Acid reflux   . BPH (benign prostatic hyperplasia)   . Hiatal hernia   . Hyperlipidemia   . Hypertension   . Pre-diabetes   . Sleep apnea    sometimes does not use d/t poor fitting of the mask  . Ulcer     Family History  Problem Relation Age of Onset  . Diabetes Mother   . Diabetes Brother   . Heart attack Neg Hx   . Hyperlipidemia Neg Hx   . Hypertension Neg Hx   . Sudden death Neg Hx     Past Surgical History:  Procedure Laterality Date  . ESOPHAGEAL MANOMETRY N/A 04/27/2016   Procedure: ESOPHAGEAL MANOMETRY (EM) with [place];  Surgeon: Lucilla Lame, MD;  Location: [facility];  Service: Endoscopy;  Laterality: N/A;  . ESOPHAGEAL MANOMETRY N/A 04/19/2017   Procedure: ESOPHAGEAL MANOMETRY (EM);  Surgeon: Wilford Corner, MD;  Location: [facility];  Service: Endoscopy;   Laterality: N/A;  . FACIAL LACERATIONS REPAIR    . INSERTION OF MESH N/A 06/02/2017   Procedure: INSERTION OF MESH;  Surgeon: Ralene Ok, MD;  Location: [facility];  Service: General;  Laterality: N/A;   Social History   Occupational History  . Occupation: Chief Financial Officer  Tobacco Use  . Smoking status: Never Smoker  . Smokeless tobacco: Never Used  Substance and Sexual Activity  . Alcohol use: No    Alcohol/week: 0.0 standard drinks  . Drug use: No  . Sexual activity: Never

## 2019-09-18 ENCOUNTER — Ambulatory Visit: Payer: Medicaid Other | Admitting: Family Medicine

## 2019-09-18 ENCOUNTER — Other Ambulatory Visit: Payer: Self-pay

## 2019-09-18 ENCOUNTER — Ambulatory Visit (INDEPENDENT_AMBULATORY_CARE_PROVIDER_SITE_OTHER): Payer: Medicaid Other | Admitting: Orthopedic Surgery

## 2019-09-18 DIAGNOSIS — R42 Dizziness and giddiness: Secondary | ICD-10-CM

## 2019-09-18 DIAGNOSIS — M659 Synovitis and tenosynovitis, unspecified: Secondary | ICD-10-CM

## 2019-09-20 ENCOUNTER — Encounter: Payer: Self-pay | Admitting: Orthopedic Surgery

## 2019-09-20 DIAGNOSIS — M659 Synovitis and tenosynovitis, unspecified: Secondary | ICD-10-CM

## 2019-09-20 MED ORDER — BUPIVACAINE HCL 0.5 % IJ SOLN
1.0000 mL | INTRAMUSCULAR | Status: AC | PRN
  Administered 2019-09-20: 21:00:00 1 mL via INTRA_ARTICULAR

## 2019-09-20 MED ORDER — LIDOCAINE HCL 1 % IJ SOLN
3.0000 mL | INTRAMUSCULAR | Status: AC | PRN
  Administered 2019-09-20: 21:00:00 3 mL

## 2019-09-20 MED ORDER — TRIAMCINOLONE ACETONIDE 40 MG/ML IJ SUSP
20.0000 mg | INTRAMUSCULAR | Status: AC | PRN
  Administered 2019-09-20: 21:00:00 20 mg via INTRA_ARTICULAR

## 2019-09-20 NOTE — Progress Notes (Signed)
   Procedure Note  Patient: Brandon Clark             Date of Birth: 12-Jul-1940           MRN: [id number]             Visit Date: 09/18/2019  Procedures: Visit Diagnoses:  1. Dizziness   2. Synovitis of left ankle     Medium Joint Inj: L ankle on 09/20/2019 8:40 PM Details: 22 G needle, ultrasound-guided anterolateral approach Medications: 3 mL lidocaine 1 %; 20 mg triamcinolone acetonide 40 MG/ML; 1 mL bupivacaine 0.5 %

## 2019-10-16 ENCOUNTER — Ambulatory Visit: Payer: Medicaid Other | Attending: Family Medicine | Admitting: Family Medicine

## 2019-10-16 ENCOUNTER — Other Ambulatory Visit: Payer: Self-pay

## 2019-10-16 ENCOUNTER — Encounter: Payer: Self-pay | Admitting: Family Medicine

## 2019-10-16 VITALS — BP 125/82 | HR 76 | Ht 73.0 in | Wt 250.0 lb

## 2019-10-16 DIAGNOSIS — K0889 Other specified disorders of teeth and supporting structures: Secondary | ICD-10-CM

## 2019-10-16 DIAGNOSIS — R399 Unspecified symptoms and signs involving the genitourinary system: Secondary | ICD-10-CM

## 2019-10-16 DIAGNOSIS — I10 Essential (primary) hypertension: Secondary | ICD-10-CM

## 2019-10-16 DIAGNOSIS — Z79899 Other long term (current) drug therapy: Secondary | ICD-10-CM | POA: Diagnosis not present

## 2019-10-16 DIAGNOSIS — R351 Nocturia: Secondary | ICD-10-CM | POA: Insufficient documentation

## 2019-10-16 DIAGNOSIS — E7800 Pure hypercholesterolemia, unspecified: Secondary | ICD-10-CM

## 2019-10-16 DIAGNOSIS — G4730 Sleep apnea, unspecified: Secondary | ICD-10-CM | POA: Insufficient documentation

## 2019-10-16 DIAGNOSIS — G4709 Other insomnia: Secondary | ICD-10-CM | POA: Diagnosis not present

## 2019-10-16 DIAGNOSIS — E785 Hyperlipidemia, unspecified: Secondary | ICD-10-CM | POA: Diagnosis not present

## 2019-10-16 DIAGNOSIS — Z833 Family history of diabetes mellitus: Secondary | ICD-10-CM | POA: Insufficient documentation

## 2019-10-16 DIAGNOSIS — R3915 Urgency of urination: Secondary | ICD-10-CM | POA: Insufficient documentation

## 2019-10-16 DIAGNOSIS — N401 Enlarged prostate with lower urinary tract symptoms: Secondary | ICD-10-CM | POA: Diagnosis not present

## 2019-10-16 DIAGNOSIS — K219 Gastro-esophageal reflux disease without esophagitis: Secondary | ICD-10-CM | POA: Insufficient documentation

## 2019-10-16 MED ORDER — SIMVASTATIN 10 MG PO TABS: 10.0000 mg | ORAL_TABLET | Freq: Every day | ORAL | 5 refills | Status: DC

## 2019-10-16 MED ORDER — TAMSULOSIN HCL 0.4 MG PO CAPS: 0.4000 mg | ORAL_CAPSULE | Freq: Every day | ORAL | 5 refills | Status: DC

## 2019-10-16 MED ORDER — FINASTERIDE 5 MG PO TABS: 5.0000 mg | ORAL_TABLET | Freq: Every day | ORAL | 3 refills | Status: DC

## 2019-10-16 MED ORDER — TRAZODONE HCL 50 MG PO TABS: 50.0000 mg | ORAL_TABLET | Freq: Every evening | ORAL | 3 refills | Status: DC | PRN

## 2019-10-16 MED FILL — FINASTERIDE 5 MG TABLET: 5 | 30 days supply | Qty: 30 | Fill #0

## 2019-10-16 MED FILL — SIMVASTATIN 10 MG TABLET: 10 | 30 days supply | Qty: 30 | Fill #0

## 2019-10-16 MED FILL — TAMSULOSIN HCL 0.4 MG CAP: 0.4 | 30 days supply | Qty: 30 | Fill #0

## 2019-10-16 MED FILL — traZODone HCL 50 MG TABS: 50 | 30 days supply | Qty: 30 | Fill #0

## 2019-10-16 NOTE — Progress Notes (Signed)
Subjective:  Patient ID: Brandon Clark, male    DOB: 1940/10/10  Age: 79 y.o. MRN: [id number]  CC: Hypertension   HPI Brandon Clark  is a 79 year old male with a history of hypertension, hyperlipidemia, GERD, Vertigo , hiatal hernia status post robotic assisted repair and nissen fundoplication in [id number]/0093 who comes into the clinic today for a follow-up visit.  Hs teeth hurt and he would like to see the Dentist. He complains of urinary urgency and associated accidents and has a lot of nocturia. States Flomax was ineffective while he was on it but of recent he has not been taking it. He stopped taking all his medications 'because he felt better' and he did not need a Doctor's visit and surprisingly his blood pressure is normal without HCTZ.Marland Kitchen  He complains of intermittent daytime somnolence but can't stay asleep at night. Sleep is interrupted by nocturia.  Past Medical History:  Diagnosis Date  . Acid reflux   . BPH (benign prostatic hyperplasia)   . Hiatal hernia   . Hyperlipidemia   . Hypertension   . Pre-diabetes   . Sleep apnea    sometimes does not use d/t poor fitting of the mask  . Ulcer     Past Surgical History:  Procedure Laterality Date  . ESOPHAGEAL MANOMETRY N/A 04/27/2016   Procedure: ESOPHAGEAL MANOMETRY (EM) with [place];  Surgeon: Lucilla Lame, MD;  Location: [facility] ENDOSCOPY;  Service: Endoscopy;  Laterality: N/A;  . ESOPHAGEAL MANOMETRY N/A 04/19/2017   Procedure: ESOPHAGEAL MANOMETRY (EM);  Surgeon: Wilford Corner, MD;  Location: [facility];  Service: Endoscopy;  Laterality: N/A;  . FACIAL LACERATIONS REPAIR    . INSERTION OF MESH N/A 06/02/2017   Procedure: INSERTION OF MESH;  Surgeon: Ralene Ok, MD;  Location: [facility];  Service: General;  Laterality: N/A;    Family History  Problem Relation Age of Onset  . Diabetes Mother   . Diabetes Brother   . Heart attack Neg Hx   . Hyperlipidemia Neg Hx   . Hypertension Neg Hx   .  Sudden death Neg Hx     No Known Allergies  Outpatient Medications Prior to Visit  Medication Sig Dispense Refill  . acetaminophen (TYLENOL) 500 MG tablet Take 500-1,000 mg by mouth every 6 (six) hours as needed.    Marland Kitchen aspirin EC 325 MG tablet Take 1 tablet (325 mg total) by mouth daily. (Patient not taking: Reported on 10/16/2019) 30 tablet 0  . donepezil (ARICEPT) 10 MG tablet Take 1 tablet (10 mg total) by mouth at bedtime. (Patient not taking: Reported on 10/16/2019) 30 tablet 5  . hydrochlorothiazide (HYDRODIURIL) 12.5 MG tablet Take 1 tablet (12.5 mg total) by mouth daily. (Patient not taking: Reported on 10/16/2019) 30 tablet 5  . HYDROcodone-acetaminophen (NORCO/VICODIN) 5-325 MG tablet Take 1 tablet by mouth every 6 (six) hours as needed. (Patient not taking: Reported on 10/16/2019) 15 tablet 0  . lactulose (CHRONULAC) 10 GM/15ML solution Take 15 mLs (10 g total) by mouth 2 (two) times daily as needed for mild constipation. (Patient not taking: Reported on 10/16/2019) 946 mL 1  . meclizine (ANTIVERT) 25 MG tablet Take 1 tablet (25 mg total) by mouth 2 (two) times daily as needed. (Patient not taking: Reported on 10/16/2019) 30 tablet 3  . meloxicam (MOBIC) 15 MG tablet Take 1 tablet (15 mg total) by mouth daily. (Patient not taking: Reported on 10/16/2019) 30 tablet 0  . methocarbamol (ROBAXIN) 500 MG tablet Take 500 mg by mouth  every 8 (eight) hours as needed for muscle spasms.    . simvastatin (ZOCOR) 10 MG tablet Take 1 tablet (10 mg total) by mouth at bedtime. (Patient not taking: Reported on 10/16/2019) 30 tablet 5  . tamsulosin (FLOMAX) 0.4 MG CAPS capsule Take 1 capsule (0.4 mg total) by mouth daily. (Patient not taking: Reported on 10/16/2019) 30 capsule 5  . traMADol (ULTRAM) 50 MG tablet Take 1 tablet (50 mg total) by mouth every 12 (twelve) hours as needed. (Patient not taking: Reported on 10/16/2019) 30 tablet 0  . valACYclovir (VALTREX) 1000 MG tablet Take 1 tablet (1,000 mg total) by  mouth 3 (three) times daily. (Patient not taking: Reported on 10/16/2019) 21 tablet 0  . zolpidem (AMBIEN) 5 MG tablet Take 1 tablet (5 mg total) by mouth at bedtime as needed for sleep. (Patient not taking: Reported on 10/16/2019) 15 tablet 1   No facility-administered medications prior to visit.     ROS Review of Systems  Constitutional: Negative for activity change and appetite change.  HENT: Positive for dental problem. Negative for sinus pressure and sore throat.   Eyes: Negative for visual disturbance.  Respiratory: Negative for cough, chest tightness and shortness of breath.   Cardiovascular: Negative for chest pain and leg swelling.  Gastrointestinal: Negative for abdominal distention, abdominal pain, constipation and diarrhea.  Endocrine: Negative.   Genitourinary: Positive for urgency. Negative for dysuria and hematuria.  Musculoskeletal: Negative for joint swelling and myalgias.  Skin: Negative for rash.  Allergic/Immunologic: Negative.   Neurological: Negative for weakness, light-headedness and numbness.  Psychiatric/Behavioral: Negative for dysphoric mood and suicidal ideas.    Objective:  BP 125/82   Pulse 76   Ht '6\' 1"'  (1.854 m)   Wt 250 lb (113.4 kg)   SpO2 96%   BMI 32.98 kg/m   BP/Weight 10/16/2019 03/15/2019 2/94/7654  Systolic BP 650 354 656  Diastolic BP 82 97 96  Wt. (Lbs) 250 - -  BMI 32.98 - -      Physical Exam Constitutional:      Appearance: He is well-developed.  Neck:     Vascular: No JVD.  Cardiovascular:     Rate and Rhythm: Normal rate.     Heart sounds: Normal heart sounds. No murmur.  Pulmonary:     Effort: Pulmonary effort is normal.     Breath sounds: Normal breath sounds. No wheezing or rales.  Chest:     Chest wall: No tenderness.  Abdominal:     General: Bowel sounds are normal. There is no distension.     Palpations: Abdomen is soft. There is no mass.     Tenderness: There is no abdominal tenderness.     Hernia: A hernia  (ventral) is present.  Musculoskeletal:        General: Normal range of motion.     Right lower leg: No edema.     Left lower leg: No edema.  Neurological:     Mental Status: He is alert and oriented to person, place, and time.  Psychiatric:        Mood and Affect: Mood normal.     CMP Latest Ref Rng & Units 03/01/2019 06/09/2017 06/03/2017  Glucose 70 - 99 mg/dL 111(H) 114(H) 136(H)  BUN 8 - 23 mg/dL '16 16 19  ' Creatinine 0.61 - 1.24 mg/dL 1.03 1.16 0.89  Sodium 135 - 145 mmol/L 137 134(L) 137  Potassium 3.5 - 5.1 mmol/L 4.0 4.0 3.8  Chloride 98 - 111 mmol/L 102 97(L)  106  CO2 22 - 32 mmol/L '25 28 26  ' Calcium 8.9 - 10.3 mg/dL 8.9 8.7(L) 8.3(L)  Total Protein 6.5 - 8.1 g/dL 6.3(L) 6.9 -  Total Bilirubin 0.3 - 1.2 mg/dL 1.1 2.2(H) -  Alkaline Phos 38 - 126 U/L 46 92 -  AST 15 - 41 U/L 17 34 -  ALT 0 - 44 U/L 15 105(H) -    Lipid Panel     Component Value Date/Time   CHOL 181 01/27/2017 1025   TRIG 109 01/27/2017 1025   HDL 45 01/27/2017 1025   CHOLHDL 4.0 01/27/2017 1025   CHOLHDL 3.9 12/18/2015 1622   VLDL 13 12/18/2015 1622   LDLCALC 114 (H) 01/27/2017 1025    CBC    Component Value Date/Time   WBC 4.3 03/01/2019 1156   RBC 5.60 03/01/2019 1156   HGB 16.7 03/01/2019 1156   HCT 50.2 03/01/2019 1156   PLT 202 03/01/2019 1156   MCV 89.6 03/01/2019 1156   MCH 29.8 03/01/2019 1156   MCHC 33.3 03/01/2019 1156   RDW 13.2 03/01/2019 1156   LYMPHSABS 2.1 03/01/2019 1156   MONOABS 0.8 03/01/2019 1156   EOSABS 0.1 03/01/2019 1156   BASOSABS 0.1 03/01/2019 1156    Lab Results  Component Value Date   HGBA1C 6.1 05/12/2017    Assessment & Plan:  1. Lower urinary tract symptoms Uncontrolled Proscar added to regimen We will check PSA If symptoms remain uncontrolled, will consider referral to urology - tamsulosin (FLOMAX) 0.4 MG CAPS capsule; Take 1 capsule (0.4 mg total) by mouth daily.  Dispense: 30 capsule; Refill: 5 - PSA, total and free - finasteride (PROSCAR)  5 MG tablet; Take 1 tablet (5 mg total) by mouth daily.  Dispense: 30 tablet; Refill: 3  2. Pure hypercholesterolemia Noncompliant with statin We will send of lipid panel and if abnormal no regimen change will be made given noncompliance Advised to resume simvastatin Low-cholesterol diet - simvastatin (ZOCOR) 10 MG tablet; Take 1 tablet (10 mg total) by mouth at bedtime.  Dispense: 30 tablet; Refill: 5 - CMP14+EGFR - Lipid panel  3. Essential hypertension Controlled He is currently off his HCTZ We will hold off on restarting Continue diet control Counseled on blood pressure goal of less than 130/80, low-sodium, DASH diet, medication compliance, 150 minutes of moderate intensity exercise per week. Discussed medication compliance, adverse effects.   4. Other insomnia Sleep is interrupted by nocturia Hopefully optimization of medication for lower urinary tract symptoms will bring about improvement Refill trazodone - traZODone (DESYREL) 50 MG tablet; Take 1 tablet (50 mg total) by mouth at bedtime as needed for sleep.  Dispense: 30 tablet; Refill: 3  5. Pain, dental - Ambulatory referral to Dentistry        Charlott Rakes, MD, FAAFP. [facility] and [place], [place]   10/16/2019, 1:47 PM

## 2019-10-16 NOTE — Patient Instructions (Signed)

## 2019-10-16 NOTE — Progress Notes (Signed)
Prostate concern, states that he has taken medication and it is not working.  States that he falls asleep a lot.  Having dental pain today.  Patient has not been taking any medications.

## 2019-10-17 LAB — CMP14+EGFR
ALT: 14 IU/L (ref 0–44)
AST: 15 IU/L (ref 0–40)
Albumin/Globulin Ratio: 1.9 (ref 1.2–2.2)
Albumin: 4.2 g/dL (ref 3.7–4.7)
Alkaline Phosphatase: 56 IU/L (ref 39–117)
BUN/Creatinine Ratio: 20 (ref 10–24)
BUN: 18 mg/dL (ref 8–27)
Bilirubin Total: 0.4 mg/dL (ref 0.0–1.2)
CO2: 23 mmol/L (ref 20–29)
Calcium: 9.1 mg/dL (ref 8.6–10.2)
Chloride: 104 mmol/L (ref 96–106)
Creatinine, Ser: 0.92 mg/dL (ref 0.76–1.27)
GFR calc Af Amer: 93 mL/min/{1.73_m2} (ref >59)
GFR calc non Af Amer: 81 mL/min/{1.73_m2} (ref >59)
Globulin, Total: 2.2 g/dL (ref 1.5–4.5)
Glucose: 109 mg/dL — ABNORMAL HIGH (ref 65–99)
Potassium: 4.8 mmol/L (ref 3.5–5.2)
Sodium: 139 mmol/L (ref 134–144)
Total Protein: 6.4 g/dL (ref 6.0–8.5)

## 2019-10-17 LAB — LIPID PANEL
Chol/HDL Ratio: 4 ratio (ref 0.0–5.0)
Cholesterol, Total: 195 mg/dL (ref 100–199)
HDL: 49 mg/dL (ref >39)
LDL Chol Calc (NIH): 133 mg/dL — ABNORMAL HIGH (ref 0–99)
Triglycerides: 72 mg/dL (ref 0–149)
VLDL Cholesterol Cal: 13 mg/dL (ref 5–40)

## 2019-10-17 LAB — PSA, TOTAL AND FREE
PSA, Free Pct: 19.5 %
PSA, Free: 0.43 ng/mL
Prostate Specific Ag, Serum: 2.2 ng/mL (ref 0.0–4.0)

## 2019-10-25 ENCOUNTER — Telehealth: Payer: Self-pay

## 2019-10-25 NOTE — Telephone Encounter (Signed)
-----   Message from Hoy Register, MD sent at 10/17/2019 10:12 AM EDT ----- Prostate test is negative for malignancy, kidney and liver functions are normal.  Cholesterol is elevated because he has been off his cholesterol medication for some time and has been advised to resume it.  Continue low-cholesterol diet.

## 2019-10-25 NOTE — Telephone Encounter (Signed)
Patient was called and a voicemail was left informing patient to return phone call for lab results. 

## 2019-11-07 ENCOUNTER — Telehealth: Payer: Self-pay

## 2019-11-07 NOTE — Telephone Encounter (Signed)
Letter has been mailed out to patient regarding lab results. 

## 2019-11-07 NOTE — Telephone Encounter (Signed)
-----   Message from Enobong Newlin, MD sent at 10/17/2019 10:12 AM EDT ----- Prostate test is negative for malignancy, kidney and liver functions are normal.  Cholesterol is elevated because he has been off his cholesterol medication for some time and has been advised to resume it.  Continue low-cholesterol diet.  

## 2019-11-08 ENCOUNTER — Other Ambulatory Visit: Payer: Self-pay

## 2019-11-08 ENCOUNTER — Encounter: Payer: Self-pay | Admitting: Surgical

## 2019-11-08 ENCOUNTER — Ambulatory Visit (INDEPENDENT_AMBULATORY_CARE_PROVIDER_SITE_OTHER): Payer: Medicaid Other | Admitting: Surgical

## 2019-11-08 DIAGNOSIS — M1711 Unilateral primary osteoarthritis, right knee: Secondary | ICD-10-CM | POA: Diagnosis not present

## 2019-11-08 DIAGNOSIS — M1712 Unilateral primary osteoarthritis, left knee: Secondary | ICD-10-CM

## 2019-11-09 ENCOUNTER — Encounter: Payer: Self-pay | Admitting: Surgical

## 2019-11-09 DIAGNOSIS — M1712 Unilateral primary osteoarthritis, left knee: Secondary | ICD-10-CM

## 2019-11-09 DIAGNOSIS — M1711 Unilateral primary osteoarthritis, right knee: Secondary | ICD-10-CM

## 2019-11-09 MED ORDER — BUPIVACAINE HCL 0.25 % IJ SOLN
4.0000 mL | INTRAMUSCULAR | Status: AC | PRN
  Administered 2019-11-09: 4 mL via INTRA_ARTICULAR

## 2019-11-09 MED ORDER — LIDOCAINE HCL 1 % IJ SOLN
5.0000 mL | INTRAMUSCULAR | Status: AC | PRN
  Administered 2019-11-09: 5 mL

## 2019-11-09 MED ORDER — METHYLPREDNISOLONE ACETATE 40 MG/ML IJ SUSP
40.0000 mg | INTRAMUSCULAR | Status: AC | PRN
  Administered 2019-11-09: 40 mg via INTRA_ARTICULAR

## 2019-11-09 NOTE — Progress Notes (Signed)
   Procedure Note  Patient: Brandon Clark             Date of Birth: [birth year]           MRN: [id number]             Visit Date: 11/08/2019  Procedures: Visit Diagnoses: No diagnosis found.  Large Joint Inj: bilateral knee on 11/09/2019 2:58 PM Indications: diagnostic evaluation, joint swelling and pain Details: 18 G 1.5 in needle, superolateral approach  Arthrogram: No  Medications (Right): 5 mL lidocaine 1 %; 4 mL bupivacaine 0.25 %; 40 mg methylPREDNISolone acetate 40 MG/ML Medications (Left): 5 mL lidocaine 1 %; 4 mL bupivacaine 0.25 %; 40 mg methylPREDNISolone acetate 40 MG/ML Outcome: tolerated well, no immediate complications Procedure, treatment alternatives, risks and benefits explained, specific risks discussed. Consent was given by the patient. Immediately prior to procedure a time out was called to verify the correct patient, procedure, equipment, support staff and site/side marked as required. Patient was prepped and draped in the usual sterile fashion.

## 2019-11-25 NOTE — Telephone Encounter (Signed)
Pt came by with letter from [facility] regarding lab results. Pt requesting phone call to give lab results. [phone].

## 2019-11-26 NOTE — Telephone Encounter (Signed)
Patient was called and a voicemail was left informing patient to return phone call for lab results. 

## 2019-12-03 MED FILL — AMOXICILLIN 500 MG CAPSULE: 500 | 7 days supply | Qty: 21 | Fill #0

## 2020-01-03 DIAGNOSIS — R413 Other amnesia: Secondary | ICD-10-CM | POA: Diagnosis not present

## 2020-01-03 DIAGNOSIS — R739 Hyperglycemia, unspecified: Secondary | ICD-10-CM | POA: Diagnosis not present

## 2020-01-03 DIAGNOSIS — R42 Dizziness and giddiness: Secondary | ICD-10-CM | POA: Diagnosis not present

## 2020-01-09 DIAGNOSIS — E119 Type 2 diabetes mellitus without complications: Secondary | ICD-10-CM

## 2020-01-09 DIAGNOSIS — E781 Pure hyperglyceridemia: Secondary | ICD-10-CM | POA: Insufficient documentation

## 2020-02-10 DIAGNOSIS — E559 Vitamin D deficiency, unspecified: Secondary | ICD-10-CM | POA: Insufficient documentation

## 2020-02-10 DIAGNOSIS — G4709 Other insomnia: Secondary | ICD-10-CM | POA: Diagnosis not present

## 2020-02-10 DIAGNOSIS — E781 Pure hyperglyceridemia: Secondary | ICD-10-CM | POA: Diagnosis not present

## 2020-02-10 DIAGNOSIS — R413 Other amnesia: Secondary | ICD-10-CM | POA: Diagnosis not present

## 2020-02-10 DIAGNOSIS — L089 Local infection of the skin and subcutaneous tissue, unspecified: Secondary | ICD-10-CM | POA: Diagnosis not present

## 2020-02-17 ENCOUNTER — Ambulatory Visit: Payer: Medicaid Other | Admitting: Family Medicine

## 2020-02-25 DIAGNOSIS — L910 Hypertrophic scar: Secondary | ICD-10-CM | POA: Diagnosis not present

## 2020-02-25 DIAGNOSIS — L918 Other hypertrophic disorders of the skin: Secondary | ICD-10-CM | POA: Diagnosis not present

## 2020-02-25 DIAGNOSIS — L720 Epidermal cyst: Secondary | ICD-10-CM | POA: Diagnosis not present

## 2020-02-25 DIAGNOSIS — R413 Other amnesia: Secondary | ICD-10-CM | POA: Diagnosis not present

## 2020-02-25 DIAGNOSIS — R590 Localized enlarged lymph nodes: Secondary | ICD-10-CM | POA: Diagnosis not present

## 2020-02-25 DIAGNOSIS — K089 Disorder of teeth and supporting structures, unspecified: Secondary | ICD-10-CM | POA: Diagnosis not present

## 2020-02-28 DIAGNOSIS — R42 Dizziness and giddiness: Secondary | ICD-10-CM | POA: Diagnosis not present

## 2020-03-13 ENCOUNTER — Ambulatory Visit (INDEPENDENT_AMBULATORY_CARE_PROVIDER_SITE_OTHER): Payer: Medicaid Other | Admitting: Surgical

## 2020-03-13 ENCOUNTER — Encounter: Payer: Self-pay | Admitting: Surgical

## 2020-03-13 DIAGNOSIS — M1711 Unilateral primary osteoarthritis, right knee: Secondary | ICD-10-CM

## 2020-03-13 DIAGNOSIS — M1712 Unilateral primary osteoarthritis, left knee: Secondary | ICD-10-CM

## 2020-03-13 MED ORDER — METHYLPREDNISOLONE ACETATE 40 MG/ML IJ SUSP
40.0000 mg | INTRAMUSCULAR | Status: AC | PRN
  Administered 2020-03-13: 40 mg via INTRA_ARTICULAR

## 2020-03-13 MED ORDER — LIDOCAINE HCL 1 % IJ SOLN
5.0000 mL | INTRAMUSCULAR | Status: AC | PRN
  Administered 2020-03-13: 5 mL

## 2020-03-13 MED ORDER — BUPIVACAINE HCL 0.25 % IJ SOLN
4.0000 mL | INTRAMUSCULAR | Status: AC | PRN
  Administered 2020-03-13: 4 mL via INTRA_ARTICULAR

## 2020-03-13 NOTE — Progress Notes (Signed)
   Procedure Note  Patient: Brandon Clark             Date of Birth: 28-Dec-1940           MRN: [id number]             Visit Date: 03/13/2020  Procedures: Visit Diagnoses:  1. Unilateral primary osteoarthritis, right knee   2. Unilateral primary osteoarthritis, left knee     Large Joint Inj: bilateral knee on 03/13/2020 4:29 PM Indications: diagnostic evaluation, joint swelling and pain Details: 18 G 1.5 in needle, superolateral approach  Arthrogram: No  Medications (Right): 5 mL lidocaine 1 %; 4 mL bupivacaine 0.25 %; 40 mg methylPREDNISolone acetate 40 MG/ML Medications (Left): 5 mL lidocaine 1 %; 4 mL bupivacaine 0.25 %; 40 mg methylPREDNISolone acetate 40 MG/ML Outcome: tolerated well, no immediate complications  Patient denies any changes in his symptoms since last office visit.  No recent diagnosis of diabetes. Procedure, treatment alternatives, risks and benefits explained, specific risks discussed. Consent was given by the patient. Immediately prior to procedure a time out was called to verify the correct patient, procedure, equipment, support staff and site/side marked as required. Patient was prepped and draped in the usual sterile fashion.

## 2020-03-18 DIAGNOSIS — F0390 Unspecified dementia without behavioral disturbance: Secondary | ICD-10-CM | POA: Diagnosis not present

## 2020-03-18 DIAGNOSIS — R7303 Prediabetes: Secondary | ICD-10-CM | POA: Diagnosis not present

## 2020-03-18 DIAGNOSIS — R413 Other amnesia: Secondary | ICD-10-CM | POA: Diagnosis not present

## 2020-03-18 DIAGNOSIS — G609 Hereditary and idiopathic neuropathy, unspecified: Secondary | ICD-10-CM | POA: Diagnosis not present

## 2020-03-30 ENCOUNTER — Ambulatory Visit (INDEPENDENT_AMBULATORY_CARE_PROVIDER_SITE_OTHER): Payer: Medicaid Other | Admitting: Orthopedic Surgery

## 2020-03-30 ENCOUNTER — Telehealth: Payer: Self-pay

## 2020-03-30 DIAGNOSIS — M25512 Pain in left shoulder: Secondary | ICD-10-CM | POA: Diagnosis not present

## 2020-03-30 DIAGNOSIS — M25511 Pain in right shoulder: Secondary | ICD-10-CM | POA: Diagnosis not present

## 2020-03-30 DIAGNOSIS — M7542 Impingement syndrome of left shoulder: Secondary | ICD-10-CM | POA: Diagnosis not present

## 2020-03-30 DIAGNOSIS — M7541 Impingement syndrome of right shoulder: Secondary | ICD-10-CM

## 2020-04-01 ENCOUNTER — Encounter: Payer: Self-pay | Admitting: Orthopedic Surgery

## 2020-04-01 DIAGNOSIS — M7541 Impingement syndrome of right shoulder: Secondary | ICD-10-CM | POA: Diagnosis not present

## 2020-04-01 DIAGNOSIS — M25511 Pain in right shoulder: Secondary | ICD-10-CM | POA: Diagnosis not present

## 2020-04-01 DIAGNOSIS — M7542 Impingement syndrome of left shoulder: Secondary | ICD-10-CM | POA: Diagnosis not present

## 2020-04-01 DIAGNOSIS — M25512 Pain in left shoulder: Secondary | ICD-10-CM | POA: Diagnosis not present

## 2020-04-01 MED ORDER — LIDOCAINE HCL 1 % IJ SOLN
5.0000 mL | INTRAMUSCULAR | Status: AC | PRN
  Administered 2020-04-01: 5 mL

## 2020-04-01 MED ORDER — BUPIVACAINE HCL 0.5 % IJ SOLN
9.0000 mL | INTRAMUSCULAR | Status: AC | PRN
  Administered 2020-04-01: 9 mL via INTRA_ARTICULAR

## 2020-04-01 MED ORDER — METHYLPREDNISOLONE ACETATE 40 MG/ML IJ SUSP
40.0000 mg | INTRAMUSCULAR | Status: AC | PRN
  Administered 2020-04-01: 40 mg via INTRA_ARTICULAR

## 2020-04-01 NOTE — Progress Notes (Signed)
Office Visit Note   Patient: Brandon Clark           Date of Birth: [birth year]           MRN: [id number] Visit Date: 03/30/2020 Requested by: No referring provider defined for this encounter. PCP: Patient, No Pcp Per  Subjective: Chief Complaint  Patient presents with   Right Shoulder - Pain   Left Shoulder - Pain    HPI: Patient presents for evaluation of bilateral shoulder pain.  He does not really want any type of surgical intervention but he is concerned about the pain is having in his shoulders.  Reports constant dull aching type pain radiating to the deltoid region.  Occasionally wakes him from sleep at night.  Takes Tylenol and ibuprofen without much relief.  All this is communicated via translator.  Had prior injection done 321.  That gave him pretty good relief.              ROS: All systems reviewed are negative as they relate to the chief complaint within the history of present illness.  Patient denies  fevers or chills.   Assessment & Plan: Visit Diagnoses:  1. Bilateral shoulder pain, unspecified chronicity   2. Impingement syndrome of left shoulder   3. Impingement syndrome of right shoulder     Plan: Impression is bilateral shoulder impingement with possible rotator cuff pathology but still overall functional shoulders.  Plan is bilateral shoulder injections performed today.  We could do this about twice a year.  He is also having some left ankle issues but I like him to come back to see Franky Macho in about 2 weeks for repeat evaluation and possible injection in and/or around the ankle joint depending on what the exam shows.  Follow-Up Instructions: Return in about 2 weeks (around 04/13/2020).   Orders:  No orders of the defined types were placed in this encounter.  No orders of the defined types were placed in this encounter.     Procedures: Large Joint Inj: bilateral subacromial bursa on 04/01/2020 12:10 PM Indications: diagnostic evaluation and  pain Details: 18 G 1.5 in needle, posterior approach  Arthrogram: No  Medications (Right): 5 mL lidocaine 1 %; 9 mL bupivacaine 0.5 %; 40 mg methylPREDNISolone acetate 40 MG/ML Medications (Left): 5 mL lidocaine 1 %; 9 mL bupivacaine 0.5 %; 40 mg methylPREDNISolone acetate 40 MG/ML Outcome: tolerated well, no immediate complications Procedure, treatment alternatives, risks and benefits explained, specific risks discussed. Consent was given by the patient. Immediately prior to procedure a time out was called to verify the correct patient, procedure, equipment, support staff and site/side marked as required. Patient was prepped and draped in the usual sterile fashion.       Clinical Data: No additional findings.  Objective: Vital Signs: There were no vitals taken for this visit.  Physical Exam:   Constitutional: Patient appears well-developed HEENT:  Head: Normocephalic Eyes:EOM are normal Neck: Normal range of motion Cardiovascular: Normal rate Pulmonary/chest: Effort normal Neurologic: Patient is alert Skin: Skin is warm Psychiatric: Patient has normal mood and affect    Ortho Exam: Ortho exam demonstrates good cervical spine range of motion.  5 out of 5 grip EPL FPL interosseous wrist flexion extension bicep triceps and deltoid strength.  Palpable radial pulses bilaterally.  No masses lymphadenopathy or skin changes noted in the shoulder region.  Does have a little bit more coarseness on the right than the left with passive range of motion above 90 degrees of  abduction.  Specialty Comments:  No specialty comments available.  Imaging: No results found.   PMFS History: Patient Active Problem List   Diagnosis Date Noted   S/P Nissen fundoplication (without gastrostomy tube) procedure 06/02/2017   Dementia (HCC) 05/12/2017   Prediabetes 01/30/2017   Hiatal hernia 01/27/2017   Acid reflux 02/12/2016   Sleep apnea 02/12/2016   Diastasis recti 03/02/2015    Umbilical hernia 09/16/2014   BPV (benign positional vertigo) 07/01/2014   HTN (hypertension) 07/01/2014   Panic attack    Encephalopathy, hypertensive    Ataxia 06/30/2014   Vertigo 06/30/2014   Dyspepsia 04/03/2014   Encounter for colorectal cancer screening 04/03/2014   Bilateral shoulder pain 09/17/2013   Bilateral knee pain 02/05/2013   Past Medical History:  Diagnosis Date   Acid reflux    BPH (benign prostatic hyperplasia)    Hiatal hernia    Hyperlipidemia    Hypertension    Pre-diabetes    Sleep apnea    sometimes does not use d/t poor fitting of the mask   Ulcer     Family History  Problem Relation Age of Onset   Diabetes Mother    Diabetes Brother    Heart attack Neg Hx    Hyperlipidemia Neg Hx    Hypertension Neg Hx    Sudden death Neg Hx     Past Surgical History:  Procedure Laterality Date   ESOPHAGEAL MANOMETRY N/A 04/27/2016   Procedure: ESOPHAGEAL MANOMETRY (EM) with PH;  Surgeon: Midge Minium, MD;  Location: [facility] END[facility]COPY;  Service: Endoscopy;  Laterality: N/A;   ESOPHAGEAL MANOMETRY N/A 04/19/2017   Procedure: ESOPHAGEAL MANOMETRY (EM);  Surgeon: Charlott Rakes, MD;  Location: [facility];  Service: Endoscopy;  Laterality: N/A;   FACIAL LACERATIONS REPAIR     INSERTION OF MESH N/A 06/02/2017   Procedure: INSERTION OF MESH;  Surgeon: Axel Filler, MD;  Location: [facility];  Service: General;  Laterality: N/A;   Social History   Occupational History   Occupation: Art gallery manager  Tobacco Use   Smoking status: Never Smoker   Smokeless tobacco: Never Used  Building services engineer Use: Never used  Substance and Sexual Activity   Alcohol use: No    Alcohol/week: 0.0 standard drinks   Drug use: No   Sexual activity: Never

## 2020-04-13 ENCOUNTER — Ambulatory Visit (INDEPENDENT_AMBULATORY_CARE_PROVIDER_SITE_OTHER): Payer: Medicaid Other | Admitting: Orthopedic Surgery

## 2020-04-13 DIAGNOSIS — M659 Synovitis and tenosynovitis, unspecified: Secondary | ICD-10-CM

## 2020-04-16 DIAGNOSIS — Z23 Encounter for immunization: Secondary | ICD-10-CM | POA: Diagnosis not present

## 2020-04-16 DIAGNOSIS — F5101 Primary insomnia: Secondary | ICD-10-CM | POA: Diagnosis not present

## 2020-04-16 DIAGNOSIS — R350 Frequency of micturition: Secondary | ICD-10-CM | POA: Diagnosis not present

## 2020-04-16 DIAGNOSIS — R7303 Prediabetes: Secondary | ICD-10-CM | POA: Diagnosis not present

## 2020-04-16 DIAGNOSIS — R413 Other amnesia: Secondary | ICD-10-CM | POA: Diagnosis not present

## 2020-04-16 DIAGNOSIS — L918 Other hypertrophic disorders of the skin: Secondary | ICD-10-CM | POA: Diagnosis not present

## 2020-04-16 DIAGNOSIS — L910 Hypertrophic scar: Secondary | ICD-10-CM | POA: Diagnosis not present

## 2020-04-16 DIAGNOSIS — R42 Dizziness and giddiness: Secondary | ICD-10-CM | POA: Diagnosis not present

## 2020-04-16 DIAGNOSIS — L720 Epidermal cyst: Secondary | ICD-10-CM | POA: Diagnosis not present

## 2020-04-16 DIAGNOSIS — N401 Enlarged prostate with lower urinary tract symptoms: Secondary | ICD-10-CM | POA: Diagnosis not present

## 2020-04-16 DIAGNOSIS — E781 Pure hyperglyceridemia: Secondary | ICD-10-CM | POA: Diagnosis not present

## 2020-04-16 DIAGNOSIS — I10 Essential (primary) hypertension: Secondary | ICD-10-CM | POA: Diagnosis not present

## 2020-04-19 ENCOUNTER — Encounter: Payer: Self-pay | Admitting: Orthopedic Surgery

## 2020-04-19 DIAGNOSIS — M659 Synovitis and tenosynovitis, unspecified: Secondary | ICD-10-CM | POA: Diagnosis not present

## 2020-04-19 MED ORDER — LIDOCAINE HCL 1 % IJ SOLN
3.0000 mL | INTRAMUSCULAR | Status: AC | PRN
  Administered 2020-04-19: 3 mL

## 2020-04-19 MED ORDER — METHYLPREDNISOLONE ACETATE 40 MG/ML IJ SUSP
40.0000 mg | INTRAMUSCULAR | Status: AC | PRN
  Administered 2020-04-19: 40 mg via INTRA_ARTICULAR

## 2020-04-19 MED ORDER — BUPIVACAINE HCL 0.5 % IJ SOLN
3.0000 mL | INTRAMUSCULAR | Status: AC | PRN
  Administered 2020-04-19: 3 mL via INTRA_ARTICULAR

## 2020-04-19 NOTE — Progress Notes (Signed)
   Procedure Note  Patient: Brandon Clark             Date of Birth: 02/21/1941           MRN: [id number]             Visit Date: 04/13/2020  Procedures: Visit Diagnoses:  1. Synovitis of left ankle     Medium Joint Inj: L ankle on 04/19/2020 12:32 PM Indications: pain, joint swelling and diagnostic evaluation Details: 22 G 1.5 in needle, fluoroscopy-guided anteromedial approach Medications: 3 mL lidocaine 1 %; 40 mg methylPREDNISolone acetate 40 MG/ML; 3 mL bupivacaine 0.5 % (1/2 mL bupivacaine 0.5 %) Outcome: tolerated well, no immediate complications Procedure, treatment alternatives, risks and benefits explained, specific risks discussed. Consent was given by the patient. Immediately prior to procedure a time out was called to verify the correct patient, procedure, equipment, support staff and site/side marked as required. Patient was prepped and draped in the usual sterile fashion.

## 2020-05-18 ENCOUNTER — Ambulatory Visit (INDEPENDENT_AMBULATORY_CARE_PROVIDER_SITE_OTHER): Payer: Medicaid Other | Admitting: Orthopedic Surgery

## 2020-05-18 DIAGNOSIS — M1712 Unilateral primary osteoarthritis, left knee: Secondary | ICD-10-CM

## 2020-05-18 DIAGNOSIS — M1711 Unilateral primary osteoarthritis, right knee: Secondary | ICD-10-CM

## 2020-05-18 DIAGNOSIS — M170 Bilateral primary osteoarthritis of knee: Secondary | ICD-10-CM

## 2020-05-20 DIAGNOSIS — R413 Other amnesia: Secondary | ICD-10-CM | POA: Diagnosis not present

## 2020-05-20 DIAGNOSIS — F0390 Unspecified dementia without behavioral disturbance: Secondary | ICD-10-CM | POA: Diagnosis not present

## 2020-05-20 DIAGNOSIS — R7303 Prediabetes: Secondary | ICD-10-CM | POA: Diagnosis not present

## 2020-05-20 DIAGNOSIS — G609 Hereditary and idiopathic neuropathy, unspecified: Secondary | ICD-10-CM | POA: Diagnosis not present

## 2020-05-23 ENCOUNTER — Encounter: Payer: Self-pay | Admitting: Orthopedic Surgery

## 2020-05-23 DIAGNOSIS — M1712 Unilateral primary osteoarthritis, left knee: Secondary | ICD-10-CM | POA: Diagnosis not present

## 2020-05-23 DIAGNOSIS — M1711 Unilateral primary osteoarthritis, right knee: Secondary | ICD-10-CM | POA: Diagnosis not present

## 2020-05-23 DIAGNOSIS — M170 Bilateral primary osteoarthritis of knee: Secondary | ICD-10-CM | POA: Diagnosis not present

## 2020-05-23 MED ORDER — BUPIVACAINE HCL 0.25 % IJ SOLN
4.0000 mL | INTRAMUSCULAR | Status: AC | PRN
  Administered 2020-05-23: 4 mL via INTRA_ARTICULAR

## 2020-05-23 MED ORDER — LIDOCAINE HCL 1 % IJ SOLN
5.0000 mL | INTRAMUSCULAR | Status: AC | PRN
  Administered 2020-05-23: 5 mL

## 2020-05-23 MED ORDER — METHYLPREDNISOLONE ACETATE 40 MG/ML IJ SUSP
40.0000 mg | INTRAMUSCULAR | Status: AC | PRN
  Administered 2020-05-23: 40 mg via INTRA_ARTICULAR

## 2020-05-23 NOTE — Progress Notes (Addendum)
Office Visit Note   Patient: Brandon Clark           Date of Birth: 10/14/1940           MRN: [id number] Visit Date: 05/18/2020 Requested by: No referring provider defined for this encounter. PCP: Patient, No Pcp Per  Subjective: Chief Complaint  Patient presents with  . lower extremity pain-bilateral    HPI: Patient presents for evaluation after left ankle injection.  Also has known history of bilateral knee arthritis.  Left ankle injection did not give him much relief.  All of this communication is done through an interpreter today.  Also reports bilateral knee pain left worse than right.  Those notes are reviewed and he has had 1 injection in the left knee this year.  He was coming in for right ankle injection but in general his ankle is not hurting much.  He does report pain when he walks.              ROS: All systems reviewed are negative as they relate to the chief complaint within the history of present illness.  Patient denies  fevers or chills.   Assessment & Plan: Visit Diagnoses:  1. Unilateral primary osteoarthritis, right knee   2. Unilateral primary osteoarthritis, left knee     Plan: Impression is bilateral pes planus with physical exam evidence of posterior tib tendon laxity.  I think his best bet for his foot pain would be molded arch supports with cushioned heels.  Prescription written for that.  Patient also has progressive knee arthritis and would like to have his left knee aspirated and injected today which is done.  Continue with nonweightbearing quad strengthening exercises.  Come back in January to see Franky Macho and we can consider further knee injections if needed.  No surgical indication for the ankles at this time.  Follow-Up Instructions: Return if symptoms worsen or fail to improve.   Orders:  No orders of the defined types were placed in this encounter.  No orders of the defined types were placed in this encounter.     Procedures: Large  Joint Inj: L knee on 05/23/2020 7:55 AM Indications: diagnostic evaluation, joint swelling and pain Details: 18 G 1.5 in needle, superolateral approach  Arthrogram: No  Medications: 5 mL lidocaine 1 %; 40 mg methylPREDNISolone acetate 40 MG/ML; 4 mL bupivacaine 0.25 % Outcome: tolerated well, no immediate complications Procedure, treatment alternatives, risks and benefits explained, specific risks discussed. Consent was given by the patient. Immediately prior to procedure a time out was called to verify the correct patient, procedure, equipment, support staff and site/side marked as required. Patient was prepped and draped in the usual sterile fashion.       Clinical Data: No additional findings.  Objective: Vital Signs: There were no vitals taken for this visit.  Physical Exam:   Constitutional: Patient appears well-developed HEENT:  Head: Normocephalic Eyes:EOM are normal Neck: Normal range of motion Cardiovascular: Normal rate Pulmonary/chest: Effort normal Neurologic: Patient is alert Skin: Skin is warm Psychiatric: Patient has normal mood and affect    Ortho Exam: Ortho exam demonstrates slight varus alignment bilateral lower extremities.  Patient also has pes planus.  His heels do invert when he stands on his toes but the forefoot does collapse into valgus alignment with ambulation.  Pedal pulses palpable.  Patient has palpable intact nontender anterior to posterior to peroneal and Achilles tendons.  No real synovitis in either ankle at this time.  Range  of motion intact.  Heel cords not excessively tight.  Bilateral knees are examined.  Left knee has effusion right knee does not.  Range of motion otherwise intact with good stability to varus valgus stress at 0 30 and 90 degrees.  Specialty Comments:  No specialty comments available.  Imaging: No results found.   PMFS History: Patient Active Problem List   Diagnosis Date Noted  . S/P Nissen fundoplication (without  gastrostomy tube) procedure 06/02/2017  . Dementia (HCC) 05/12/2017  . Prediabetes 01/30/2017  . Hiatal hernia 01/27/2017  . Acid reflux 02/12/2016  . Sleep apnea 02/12/2016  . Diastasis recti 03/02/2015  . Umbilical hernia 09/16/2014  . BPV (benign positional vertigo) 07/01/2014  . HTN (hypertension) 07/01/2014  . Panic attack   . Encephalopathy, hypertensive   . Ataxia 06/30/2014  . Vertigo 06/30/2014  . Dyspepsia 04/03/2014  . Encounter for colorectal cancer screening 04/03/2014  . Bilateral shoulder pain 09/17/2013  . Bilateral knee pain 02/05/2013   Past Medical History:  Diagnosis Date  . Acid reflux   . BPH (benign prostatic hyperplasia)   . Hiatal hernia   . Hyperlipidemia   . Hypertension   . Pre-diabetes   . Sleep apnea    sometimes does not use d/t poor fitting of the mask  . Ulcer     Family History  Problem Relation Age of Onset  . Diabetes Mother   . Diabetes Brother   . Heart attack Neg Hx   . Hyperlipidemia Neg Hx   . Hypertension Neg Hx   . Sudden death Neg Hx     Past Surgical History:  Procedure Laterality Date  . ESOPHAGEAL MANOMETRY N/A 04/27/2016   Procedure: ESOPHAGEAL MANOMETRY (EM) with PH;  Surgeon: Midge Minium, MD;  Location: [facility] ENDOSCOPY;  Service: Endoscopy;  Laterality: N/A;  . ESOPHAGEAL MANOMETRY N/A 04/19/2017   Procedure: ESOPHAGEAL MANOMETRY (EM);  Surgeon: Charlott Rakes, MD;  Location: [facility] ENDOSCOPY;  Service: Endoscopy;  Laterality: N/A;  . FACIAL LACERATIONS REPAIR    . INSERTION OF MESH N/A 06/02/2017   Procedure: INSERTION OF MESH;  Surgeon: Axel Filler, MD;  Location: [facility] ORS;  Service: General;  Laterality: N/A;   Social History   Occupational History  . Occupation: Art gallery manager  Tobacco Use  . Smoking status: Never Smoker  . Smokeless tobacco: Never Used  Vaping Use  . Vaping Use: Never used  Substance and Sexual Activity  . Alcohol use: No    Alcohol/week: 0.0 standard drinks  . Drug use: No  . Sexual  activity: Never

## 2020-05-28 ENCOUNTER — Emergency Department (HOSPITAL_COMMUNITY)
Admission: EM | Admit: 2020-05-28 | Discharge: 2020-05-28 | Disposition: A | Discharge status: Home / Self Care | Payer: Medicaid Other | Attending: Emergency Medicine | Admitting: Emergency Medicine

## 2020-05-28 ENCOUNTER — Other Ambulatory Visit: Payer: Self-pay

## 2020-05-28 ENCOUNTER — Emergency Department (HOSPITAL_COMMUNITY): Payer: Medicaid Other

## 2020-05-28 ENCOUNTER — Encounter (HOSPITAL_COMMUNITY): Payer: Self-pay

## 2020-05-28 DIAGNOSIS — R42 Dizziness and giddiness: Secondary | ICD-10-CM | POA: Diagnosis not present

## 2020-05-28 DIAGNOSIS — R451 Restlessness and agitation: Secondary | ICD-10-CM | POA: Insufficient documentation

## 2020-05-28 DIAGNOSIS — Z7982 Long term (current) use of aspirin: Secondary | ICD-10-CM | POA: Diagnosis not present

## 2020-05-28 DIAGNOSIS — R2689 Other abnormalities of gait and mobility: Secondary | ICD-10-CM | POA: Insufficient documentation

## 2020-05-28 DIAGNOSIS — R110 Nausea: Secondary | ICD-10-CM | POA: Insufficient documentation

## 2020-05-28 DIAGNOSIS — R519 Headache, unspecified: Secondary | ICD-10-CM | POA: Diagnosis not present

## 2020-05-28 DIAGNOSIS — R531 Weakness: Secondary | ICD-10-CM | POA: Diagnosis not present

## 2020-05-28 DIAGNOSIS — R059 Cough, unspecified: Secondary | ICD-10-CM | POA: Diagnosis not present

## 2020-05-28 DIAGNOSIS — Z79899 Other long term (current) drug therapy: Secondary | ICD-10-CM | POA: Insufficient documentation

## 2020-05-28 DIAGNOSIS — R202 Paresthesia of skin: Secondary | ICD-10-CM | POA: Diagnosis not present

## 2020-05-28 DIAGNOSIS — J189 Pneumonia, unspecified organism: Secondary | ICD-10-CM | POA: Diagnosis not present

## 2020-05-28 DIAGNOSIS — F0390 Unspecified dementia without behavioral disturbance: Secondary | ICD-10-CM | POA: Insufficient documentation

## 2020-05-28 DIAGNOSIS — R55 Syncope and collapse: Secondary | ICD-10-CM | POA: Diagnosis not present

## 2020-05-28 DIAGNOSIS — J90 Pleural effusion, not elsewhere classified: Secondary | ICD-10-CM | POA: Diagnosis not present

## 2020-05-28 DIAGNOSIS — I10 Essential (primary) hypertension: Secondary | ICD-10-CM | POA: Diagnosis not present

## 2020-05-28 LAB — PROTIME-INR
INR: 1 (ref 0.8–1.2)
Prothrombin Time: 13.2 seconds (ref 11.4–15.2)

## 2020-05-28 LAB — DIFFERENTIAL
Abs Immature Granulocytes: 0.07 10*3/uL (ref 0.00–0.07)
Basophils Absolute: 0 10*3/uL (ref 0.0–0.1)
Basophils Relative: 1 %
Eosinophils Absolute: 0.1 10*3/uL (ref 0.0–0.5)
Eosinophils Relative: 2 %
Immature Granulocytes: 1 %
Lymphocytes Relative: 33 %
Lymphs Abs: 1.7 10*3/uL (ref 0.7–4.0)
Monocytes Absolute: 0.9 10*3/uL (ref 0.1–1.0)
Monocytes Relative: 17 %
Neutro Abs: 2.3 10*3/uL (ref 1.7–7.7)
Neutrophils Relative %: 46 %

## 2020-05-28 LAB — I-STAT CHEM 8, ED
BUN: 16 mg/dL (ref 8–23)
Calcium, Ion: 1.2 mmol/L (ref 1.15–1.40)
Chloride: 99 mmol/L (ref 98–111)
Creatinine, Ser: 1 mg/dL (ref 0.61–1.24)
Glucose, Bld: 100 mg/dL — ABNORMAL HIGH (ref 70–99)
HCT: 52 % (ref 39.0–52.0)
Hemoglobin: 17.7 g/dL — ABNORMAL HIGH (ref 13.0–17.0)
Potassium: 4 mmol/L (ref 3.5–5.1)
Sodium: 136 mmol/L (ref 135–145)
TCO2: 26 mmol/L (ref 22–32)

## 2020-05-28 LAB — CBC
HCT: 50.8 % (ref 39.0–52.0)
Hemoglobin: 16.6 g/dL (ref 13.0–17.0)
MCH: 29.1 pg (ref 26.0–34.0)
MCHC: 32.7 g/dL (ref 30.0–36.0)
MCV: 89 fL (ref 80.0–100.0)
Platelets: 207 10*3/uL (ref 150–400)
RBC: 5.71 MIL/uL (ref 4.22–5.81)
RDW: 13.6 % (ref 11.5–15.5)
WBC: 5 10*3/uL (ref 4.0–10.5)
nRBC: 0 % (ref 0.0–0.2)

## 2020-05-28 LAB — COMPREHENSIVE METABOLIC PANEL
ALT: 17 U/L (ref 0–44)
AST: 18 U/L (ref 15–41)
Albumin: 3.9 g/dL (ref 3.5–5.0)
Alkaline Phosphatase: 57 U/L (ref 38–126)
Anion gap: 10 (ref 5–15)
BUN: 14 mg/dL (ref 8–23)
CO2: 25 mmol/L (ref 22–32)
Calcium: 8.8 mg/dL — ABNORMAL LOW (ref 8.9–10.3)
Chloride: 100 mmol/L (ref 98–111)
Creatinine, Ser: 1.08 mg/dL (ref 0.61–1.24)
GFR, Estimated: >60 mL/min (ref >60)
Glucose, Bld: 105 mg/dL — ABNORMAL HIGH (ref 70–99)
Potassium: 4.2 mmol/L (ref 3.5–5.1)
Sodium: 135 mmol/L (ref 135–145)
Total Bilirubin: 1.4 mg/dL — ABNORMAL HIGH (ref 0.3–1.2)
Total Protein: 6.8 g/dL (ref 6.5–8.1)

## 2020-05-28 LAB — APTT: aPTT: 32 seconds (ref 24–36)

## 2020-05-28 MED ORDER — SODIUM CHLORIDE 0.9% FLUSH
3.0000 mL | Freq: Once | INTRAVENOUS | Status: DC
Start: 2020-05-28 — End: 2020-05-29

## 2020-05-28 MED ORDER — DIPHENHYDRAMINE HCL 25 MG PO CAPS
25.0000 mg | ORAL_CAPSULE | Freq: Once | ORAL | Status: AC
  Administered 2020-05-28: 25 mg via ORAL
  Filled 2020-05-28: qty 1

## 2020-05-28 MED ORDER — ACETAMINOPHEN 500 MG PO TABS
1000.0000 mg | ORAL_TABLET | Freq: Once | ORAL | Status: AC
  Administered 2020-05-28: 1000 mg via ORAL
  Filled 2020-05-28: qty 2

## 2020-05-28 MED ORDER — PROCHLORPERAZINE MALEATE 5 MG PO TABS
5.0000 mg | ORAL_TABLET | Freq: Once | ORAL | Status: AC
  Administered 2020-05-28: 5 mg via ORAL
  Filled 2020-05-28: qty 1

## 2020-05-28 NOTE — ED Notes (Signed)
Patient transported to CT 

## 2020-05-28 NOTE — Discharge Instructions (Addendum)
You came to the emergency department today with a chief complaint of headache and loss of balance.  We ran lab tests, a CT scan and an MRI of your head.  Your labs were unremarkable.  The CT scan and MRI of your head did not show any acute abnormality.    You need to call your neurologist Dr. Linus Salmons and set up an appointment to see him within the next week.    You can take Ibuprofen (Advil, motrin) and Tylenol (acetaminophen) to relieve your pain.  You may take up to 600 MG (3 pills) of normal strength ibuprofen every 8 hours as needed.  In between doses of ibuprofen you make take tylenol, up to 1,000 mg (two extra strength pills).  Do not take more than 3,000 mg tylenol in a 24 hour period.  Please check all medication labels as many medications such as pain and cold medications may contain tylenol.  Do not drink alcohol while taking these medications.  Do not take other NSAID'S while taking ibuprofen (such as aleve or naproxen).  Please take ibuprofen with food to decrease stomach upset.   Please return to the Emergency Department if: Your headache becomes severe quickly. Your headache gets worse after moderate to intense physical activity. You have repeated vomiting. You have a stiff neck. You have a loss of vision. You have problems with speech. You have pain in the eye or ear. You have muscular weakness or loss of muscle control. You lose your balance or have trouble walking. You feel faint or pass out. You have confusion. You have a seizure.

## 2020-05-28 NOTE — ED Notes (Signed)
Patient transported to MRI 

## 2020-05-28 NOTE — ED Notes (Signed)
Patient in MRI, will obtain vital signs upon his arrival back in his room.

## 2020-05-28 NOTE — ED Provider Notes (Signed)
Headache and loss of balance - started ysteraday - has not had one like this in the past - balance better today - didn't take meclizine the last 2 days.  Some nausea yesterday with some numbnes / tingling in the hands and generaliz3ed weakness / somnolence.  PE:  Neuro unremarkable. HEENT: No jaundice, icterus or abnormal pupils, clear mucous membranes Cardiovascular: Normal heart rate, no murmurs, normal pulses, no edema Abdomen: Soft nontender nondistended Pulmonary: Slight rales at the right base but no increased work of breathing or wheezing.  Appears nondistressed Extremities: No edema, no deformities Skin: No rashes Psychiatric: Calm answers questions appropriately  Labs look reassuring, CT negative,  Chest x-ray rule out pneumonia given mild rales  MRI eval brain for loss of balance  MRI results discussed with Dr. Amada Jupiter of the neurology service, recommends outpatient neurology follow-up, no active treatment, the patient is stable for discharge and feeling better  Medical screening examination/treatment/procedure(s) were conducted as a shared visit with non-physician practitioner(s) and myself.  I personally evaluated the patient during the encounter.  Clinical Impression:   Final diagnoses:  Acute nonintractable headache, unspecified headache type  Balance problem          Eber Hong, MD 05/29/20 1517

## 2020-05-28 NOTE — ED Notes (Signed)
Dr Hyacinth Meeker aware of pt requesting to go home a this and will be in to re evaluate pt

## 2020-05-28 NOTE — ED Provider Notes (Signed)
[facility]   CSN: [id number] Arrival date & time: 05/28/20  1511     History Chief Complaint  Patient presents with  . Fatigue    Brandon Clark is a 79 y.o. male with a history of hypertension, dementia, consistent with ataxia, and vertigo.  Patient speaks Arabic and a translator was used to conduct this interview.  Patient presents with a chief complaint of headache and loss of balance.  Patient reports that his headache is generalized, has been intermittent since yesterday, there has been no change in his headache, and nothing alleviates or aggravates it.  Patient denies any previous headaches like this but denies this the worst headache of his life.  Patient indicates that his loss of balance was intermittent yesterday and has improved today.  Patient denies taking his meclizine yesterday or today.  Patient also endorses experiencing nausea, generalized weakness, increased somnolence, "no ability to focus" and "foggy feeling," yesterday.  Patient also complains of intermittent numbness and tingling in his hands yesterday as well.  Patient denies any recent falls, trauma or head injuries.  Patient denies any seizures or syncope.  Patient also complains of nonproductive cough.        Past Medical History:  Diagnosis Date  . Acid reflux   . BPH (benign prostatic hyperplasia)   . Hiatal hernia   . Hyperlipidemia   . Hypertension   . Pre-diabetes   . Sleep apnea    sometimes does not use d/t poor fitting of the mask  . Ulcer     Patient Active Problem List   Diagnosis Date Noted  . S/P Nissen fundoplication (without gastrostomy tube) procedure 06/02/2017  . Dementia (HCC) 05/12/2017  . Prediabetes 01/30/2017  . Hiatal hernia 01/27/2017  . Acid reflux 02/12/2016  . Sleep apnea 02/12/2016  . Diastasis recti 03/02/2015  . Umbilical hernia 09/16/2014  . BPV (benign positional vertigo) 07/01/2014  . HTN  (hypertension) 07/01/2014  . Panic attack   . Encephalopathy, hypertensive   . Ataxia 06/30/2014  . Vertigo 06/30/2014  . Dyspepsia 04/03/2014  . Encounter for colorectal cancer screening 04/03/2014  . Bilateral shoulder pain 09/17/2013  . Bilateral knee pain 02/05/2013    Past Surgical History:  Procedure Laterality Date  . ESOPHAGEAL MANOMETRY N/A 04/27/2016   Procedure: ESOPHAGEAL MANOMETRY (EM) with PH;  Surgeon: Midge Miniumarren Wohl, MD;  Location: [facility] ENDOSCOPY;  Service: Endoscopy;  Laterality: N/A;  . ESOPHAGEAL MANOMETRY N/A 04/19/2017   Procedure: ESOPHAGEAL MANOMETRY (EM);  Surgeon: Charlott RakesSchooler, Vincent, MD;  Location: [facility] ENDOSCOPY;  Service: Endoscopy;  Laterality: N/A;  . FACIAL LACERATIONS REPAIR    . INSERTION OF MESH N/A 06/02/2017   Procedure: INSERTION OF MESH;  Surgeon: Axel Filleramirez, Armando, MD;  Location: [facility] ORS;  Service: General;  Laterality: N/A;       Family History  Problem Relation Age of Onset  . Diabetes Mother   . Diabetes Brother   . Heart attack Neg Hx   . Hyperlipidemia Neg Hx   . Hypertension Neg Hx   . Sudden death Neg Hx     Social History   Tobacco Use  . Smoking status: Never Smoker  . Smokeless tobacco: Never Used  Vaping Use  . Vaping Use: Never used  Substance Use Topics  . Alcohol use: No    Alcohol/week: 0.0 standard drinks  . Drug use: No    Home Medications Prior to Admission medications   Medication Sig Start Date End Date Taking?  Authorizing Provider  acetaminophen (TYLENOL) 500 MG tablet Take 500-1,000 mg by mouth every 6 (six) hours as needed.    [provider]  aspirin EC 325 MG tablet Take 1 tablet (325 mg total) by mouth daily. Patient not taking: Reported on 10/16/2019 06/09/17   Everlene Farrier, PA-C  donepezil (ARICEPT) 10 MG tablet Take 1 tablet (10 mg total) by mouth at bedtime. Patient not taking: Reported on 10/16/2019 06/12/18   Hoy Register, MD  finasteride (PROSCAR) 5 MG tablet Take 1 tablet (5 mg total) by  mouth daily. 10/16/19   Hoy Register, MD  hydrochlorothiazide (HYDRODIURIL) 12.5 MG tablet Take 1 tablet (12.5 mg total) by mouth daily. Patient not taking: Reported on 10/16/2019 06/12/18   Hoy Register, MD  HYDROcodone-acetaminophen (NORCO/VICODIN) 5-325 MG tablet Take 1 tablet by mouth every 6 (six) hours as needed. Patient not taking: Reported on 10/16/2019 03/04/19   Hedges, Tinnie Gens, PA-C  meclizine (ANTIVERT) 25 MG tablet Take 1 tablet (25 mg total) by mouth 2 (two) times daily as needed. Patient not taking: Reported on 10/16/2019 08/31/17   Hoy Register, MD  meloxicam (MOBIC) 15 MG tablet Take 1 tablet (15 mg total) by mouth daily. Patient not taking: Reported on 10/16/2019 08/07/19   Magnant, Joycie Peek, PA-C  methocarbamol (ROBAXIN) 500 MG tablet Take 500 mg by mouth every 8 (eight) hours as needed for muscle spasms. 05/13/16   [provider]  simvastatin (ZOCOR) 10 MG tablet Take 1 tablet (10 mg total) by mouth at bedtime. 10/16/19   Hoy Register, MD  tamsulosin (FLOMAX) 0.4 MG CAPS capsule Take 1 capsule (0.4 mg total) by mouth daily. 10/16/19   Hoy Register, MD  traZODone (DESYREL) 50 MG tablet Take 1 tablet (50 mg total) by mouth at bedtime as needed for sleep. 10/16/19   Hoy Register, MD    Allergies    Patient has no known allergies.  Review of Systems   Review of Systems  Constitutional: Negative for fever.  HENT: Negative for facial swelling and hearing loss.   Eyes: Negative for visual disturbance.  Respiratory: Positive for cough (nonpoductive). Negative for shortness of breath.   Cardiovascular: Negative for chest pain, palpitations and leg swelling.  Gastrointestinal: Positive for nausea. Negative for abdominal pain and vomiting.  Musculoskeletal: Negative for neck pain and neck stiffness.  Skin: Negative for color change and rash.  Neurological: Positive for syncope, weakness (Generalized), numbness (Intermittent counseling bilateral hand) and  headaches (Generalized, intermittent). Negative for tremors, seizures, facial asymmetry, speech difficulty and light-headedness.       Endorses loss of balance  Psychiatric/Behavioral: Positive for decreased concentration (trouble focusing yesterday).    Physical Exam Updated Vital Signs BP (!) 129/92 (BP Location: Right Arm)   Pulse 83   Temp 97.9 F (36.6 C)   Resp 16   SpO2 100%   Physical Exam Constitutional:      General: He is in acute distress.     Appearance: He is not ill-appearing, toxic-appearing or diaphoretic.  HENT:     Head: Normocephalic and atraumatic.     Mouth/Throat:     Mouth: Mucous membranes are moist.  Eyes:     Extraocular Movements: Extraocular movements intact.     Pupils: Pupils are equal, round, and reactive to light.  Cardiovascular:     Rate and Rhythm: Normal rate and regular rhythm.  Pulmonary:     Effort: Pulmonary effort is normal.     Breath sounds: Rales present.  Musculoskeletal:  Cervical back: Neck supple.  Skin:    General: Skin is warm and dry.     Coloration: Skin is not jaundiced or pale.     Findings: No erythema or rash.  Neurological:     General: No focal deficit present.     Mental Status: He is alert and oriented to person, place, and time.     Cranial Nerves: Cranial nerves are intact. No facial asymmetry.     Sensory: Sensation is intact.     Motor: Motor function is intact. No tremor or pronator drift.     Coordination: Romberg sign negative. Heel to Shin Test normal.     ED Results / Procedures / Treatments   Labs (all labs ordered are listed, but only abnormal results are displayed) Labs Reviewed  COMPREHENSIVE METABOLIC PANEL - Abnormal; Notable for the following components:      Result Value   Glucose, Bld 105 (*)    Calcium 8.8 (*)    Total Bilirubin 1.4 (*)    All other components within normal limits  I-STAT CHEM 8, ED - Abnormal; Notable for the following components:   Glucose, Bld 100 (*)     Hemoglobin 17.7 (*)    All other components within normal limits  PROTIME-INR  APTT  CBC  DIFFERENTIAL  CBG MONITORING, ED  I-STAT BETA HCG BLOOD, ED ([facility], [facility], [facility] ONLY)    EKG EKG Interpretation  Date/Time:  Thursday May 28 2020 15:23:36 EST Ventricular Rate:  96 PR Interval:  156 QRS Duration: 80 QT Interval:  348 QTC Calculation: 439 R Axis:   -51 Text Interpretation: Normal sinus rhythm Low voltage QRS Left anterior fascicular block Possible Anterolateral infarct , age undetermined Abnormal ECG Since last tracing q waves present throughout the anterior leads Confirmed by Eber Hong ([id number]) on 05/28/2020 3:33:16 PM   Radiology DG Chest 2 View  Result Date: 05/28/2020 CLINICAL DATA:  79 year old with acute onset of cough, generalized weakness, headache, dizziness and unsteady gait that began yesterday. Current history of hypertension. EXAM: CHEST - 2 VIEW COMPARISON:  06/05/2017. FINDINGS: Cardiac silhouette upper normal in size, unchanged. Thoracic aorta tortuous and atherosclerotic, unchanged. Hilar and mediastinal contours otherwise unremarkable. Mildly prominent bronchovascular markings diffusely and mild central peribronchial thickening, slightly more prominent than on the prior examination. Lungs otherwise clear. No localized airspace consolidation. No pleural effusions. No pneumothorax. Normal pulmonary vascularity. Degenerative changes involving the thoracic and upper lumbar spine. IMPRESSION: Mild changes of acute bronchitis and/or asthma without focal airspace pneumonia. Electronically Signed   By: Hulan Saas M.D.   On: 05/28/2020 17:16   CT HEAD WO CONTRAST  Result Date: 05/28/2020 CLINICAL DATA:  Dizziness. Fatigue. Headache. Unsteady gait. Symptoms started yesterday. EXAM: CT HEAD WITHOUT CONTRAST TECHNIQUE: Contiguous axial images were obtained from the base of the skull through the vertex without intravenous contrast. COMPARISON:  MRI head 06/30/2014  FINDINGS: Brain: No acute infarct, hemorrhage, or mass lesion is present. No significant white matter lesions are present. The ventricles are of normal size. The brainstem and cerebellum are within normal limits. Vascular: Moderate prominence of the basilar artery is stable. Vessels are somewhat dense overall, consistent with a hematocrit at the upper limits of normal. Skull: Calvarium is intact. No focal lytic or blastic lesions are present. No significant extracranial soft tissue lesion is present. Sinuses/Orbits: The paranasal sinuses and mastoid air cells are clear. The globes and orbits are within normal limits. IMPRESSION: 1. Normal CT appearance of the brain. 2. Stable prominence  of the basilar artery. Electronically Signed   By: Marin Roberts M.D.   On: 05/28/2020 16:38   MR BRAIN WO CONTRAST  Result Date: 05/28/2020 CLINICAL DATA:  Dizziness.  Nonspecific. EXAM: MRI HEAD WITHOUT CONTRAST TECHNIQUE: Multiplanar, multiecho pulse sequences of the brain and surrounding structures were obtained without intravenous contrast. COMPARISON:  CT head without contrast 05/28/2020. MR head without contrast 06/30/2014. FINDINGS: Brain: Periventricular and subcortical T2 hyperintensities have increased slightly since the prior exam, mildly advanced for age. No acute infarct, hemorrhage, or mass lesion is present. The ventricles are of normal size. No significant extraaxial fluid collection is present. The internal auditory canals are within normal limits. The brainstem and cerebellum are within normal limits. Vascular: Flow is present in the major intracranial arteries. Mild ectasia of the basilar artery is stable. Skull and upper cervical spine: The brainstem and cerebellum are within normal limits. Sinuses/Orbits: The paranasal sinuses and mastoid air cells are clear. The globes and orbits are within normal limits. IMPRESSION: 1. No acute intracranial abnormality. 2. Periventricular and subcortical T2  hyperintensities bilaterally are mildly advanced for age. The finding is nonspecific but can be seen in the setting of chronic microvascular ischemia, a demyelinating process such as multiple sclerosis, vasculitis, complicated migraine headaches, or as the sequelae of a prior infectious or inflammatory process. Electronically Signed   By: Marin Roberts M.D.   On: 05/28/2020 20:58    Procedures Procedures (including critical care time)  Medications Ordered in ED Medications  sodium chloride flush (NS) 0.9 % injection 3 mL (3 mLs Intravenous Not Given 05/28/20 1736)  acetaminophen (TYLENOL) tablet 1,000 mg (1,000 mg Oral Given 05/28/20 1732)  prochlorperazine (COMPAZINE) tablet 5 mg (5 mg Oral Given 05/28/20 1732)  diphenhydrAMINE (BENADRYL) capsule 25 mg (25 mg Oral Given 05/28/20 1732)    ED Course  I have reviewed the triage vital signs and the nursing notes.  Pertinent labs & imaging results that were available during my care of the patient were reviewed by me and considered in my medical decision making (see chart for details).    MDM Rules/Calculators/A&P                          Patient is an alert and well-appearing 79 year old male with a history of hypertension, dementia, consistent with ataxia, and vertigo.  Patient complains of headache and loss of balance.  Patient's headache is not the worst of his life, is intermittent and he denies any alleviating or aggravating sx.  Patient's loss of balance began yesterday, and has improved.  Patient also endorses experiencing nausea, generalized weakness, increased somnolence, "no ability to focus" and "foggy feeling," yesterday.  Per chart review patient was recently seen by [facility] health neurology and sleep for complaints of memory loss and gait problems.    On physical exam patient is alert and oriented x3, has no focal deficits, sensation intact and motor function intact.  Patient is found to have rales on in his right lung  fields.    CBC, CMP, EKG, are unremarkable.  EKG showed Normal sinus rhythm Low voltage QRS Left anterior fascicular block Possible Anterolateral infarct , age undetermined Abnormal ECG Since last tracing q waves present throughout the anterior leads  CT shows normal appearance of the brain and stable prominence of the basilar artery.    Due to rales CXR was ordered to evaluate for possible infectious process contributing to symptoms.  CXR showed no signs of infiltrates.  Due to patient's symptoms and equivocal CT and noncontrast brain MRI was ordered.  MRI showed No acute intracranial abnormality and Periventricular and subcortical T2 hyperintensities bilaterally are mildly advanced for age. The finding is nonspecific but can be seen in the setting of chronic microvascular ischemia, a demyelinating process such as multiple sclerosis, vasculitis, complicated migraine headaches, or as the sequelae of a prior infectious or inflammatory process.  Patient was given Tylenol, Compazine, and Benadryl to help his headache.    17:00 Patient reports his headache has resolved.  Patient was agitated he was still waiting for his MRI and wanted to leave.  I spoke to the patient about the importance of the MRI and and he is willing to stay.    Patient remains stable.  He reports feeling better after receiving medication.  He is safe to discharge home where he lives with his son.  Patient is to follow up with [facility] health neurology and sleep as he has recently been seen by them.  Patient was given strict return precautions.    Final Clinical Impression(s) / ED Diagnoses Final diagnoses:  Acute nonintractable headache, unspecified headache type  Balance problem    Rx / DC Orders ED Discharge Orders    None       Berneice Heinrich 05/29/20 Demaris Callander, MD 05/29/20 1517

## 2020-05-28 NOTE — ED Notes (Signed)
Pt speaks multiply languages will use interpreter for assessment

## 2020-05-28 NOTE — ED Triage Notes (Addendum)
Triage completed with interpreter:  Pt arrives to ED w/ c/o fatigue, generalized weakness, headache, dizziness, and unsteady gait that started yesterday. Pt has hx of HTN, but has not taken his BP meds today.

## 2020-06-01 LAB — I-STAT BETA HCG BLOOD, ED (MC, WL, AP ONLY)

## 2020-06-03 DIAGNOSIS — R413 Other amnesia: Secondary | ICD-10-CM | POA: Diagnosis not present

## 2020-06-03 DIAGNOSIS — G44219 Episodic tension-type headache, not intractable: Secondary | ICD-10-CM | POA: Diagnosis not present

## 2020-06-03 DIAGNOSIS — R059 Cough, unspecified: Secondary | ICD-10-CM | POA: Diagnosis not present

## 2020-06-24 ENCOUNTER — Encounter (HOSPITAL_COMMUNITY): Payer: Self-pay | Admitting: Emergency Medicine

## 2020-06-24 ENCOUNTER — Other Ambulatory Visit: Payer: Self-pay

## 2020-06-24 ENCOUNTER — Emergency Department (HOSPITAL_COMMUNITY)
Admission: EM | Admit: 2020-06-24 | Discharge: 2020-06-25 | Disposition: A | Discharge status: Home / Self Care | Payer: Medicaid Other | Attending: Emergency Medicine | Admitting: Emergency Medicine

## 2020-06-24 DIAGNOSIS — R631 Polydipsia: Secondary | ICD-10-CM | POA: Diagnosis not present

## 2020-06-24 DIAGNOSIS — I10 Essential (primary) hypertension: Secondary | ICD-10-CM | POA: Diagnosis not present

## 2020-06-24 DIAGNOSIS — K5900 Constipation, unspecified: Secondary | ICD-10-CM | POA: Insufficient documentation

## 2020-06-24 DIAGNOSIS — R42 Dizziness and giddiness: Secondary | ICD-10-CM | POA: Diagnosis not present

## 2020-06-24 DIAGNOSIS — R110 Nausea: Secondary | ICD-10-CM | POA: Diagnosis not present

## 2020-06-24 DIAGNOSIS — U071 COVID-19: Secondary | ICD-10-CM | POA: Insufficient documentation

## 2020-06-24 DIAGNOSIS — R7303 Prediabetes: Secondary | ICD-10-CM | POA: Insufficient documentation

## 2020-06-24 LAB — URINALYSIS, ROUTINE W REFLEX MICROSCOPIC
Bilirubin Urine: NEGATIVE
Glucose, UA: NEGATIVE mg/dL
Hgb urine dipstick: NEGATIVE
Ketones, ur: 5 mg/dL — AB
Leukocytes,Ua: NEGATIVE
Nitrite: NEGATIVE
Protein, ur: 30 mg/dL — AB
Specific Gravity, Urine: 1.026 (ref 1.005–1.030)
pH: 5 (ref 5.0–8.0)

## 2020-06-24 LAB — CBC
HCT: 47.7 % (ref 39.0–52.0)
Hemoglobin: 16.2 g/dL (ref 13.0–17.0)
MCH: 29.8 pg (ref 26.0–34.0)
MCHC: 34 g/dL (ref 30.0–36.0)
MCV: 87.7 fL (ref 80.0–100.0)
Platelets: 176 10*3/uL (ref 150–400)
RBC: 5.44 MIL/uL (ref 4.22–5.81)
RDW: 13.2 % (ref 11.5–15.5)
WBC: 3.7 10*3/uL — ABNORMAL LOW (ref 4.0–10.5)
nRBC: 0 % (ref 0.0–0.2)

## 2020-06-24 LAB — BASIC METABOLIC PANEL
Anion gap: 10 (ref 5–15)
BUN: 17 mg/dL (ref 8–23)
CO2: 27 mmol/L (ref 22–32)
Calcium: 8.7 mg/dL — ABNORMAL LOW (ref 8.9–10.3)
Chloride: 99 mmol/L (ref 98–111)
Creatinine, Ser: 1.12 mg/dL (ref 0.61–1.24)
GFR, Estimated: >60 mL/min (ref >60)
Glucose, Bld: 119 mg/dL — ABNORMAL HIGH (ref 70–99)
Potassium: 3.7 mmol/L (ref 3.5–5.1)
Sodium: 136 mmol/L (ref 135–145)

## 2020-06-24 NOTE — ED Triage Notes (Signed)
Arabic interpreter used: Pt here with multiple complaints. C/o dizziness, unable to eat, nausea, increased thirst and constipation x days.

## 2020-06-25 ENCOUNTER — Other Ambulatory Visit: Payer: Self-pay

## 2020-06-25 ENCOUNTER — Emergency Department (HOSPITAL_COMMUNITY)
Admission: EM | Admit: 2020-06-25 | Discharge: 2020-06-26 | Disposition: A | Discharge status: Home / Self Care | Payer: Medicaid Other | Source: Home / Self Care | Attending: Emergency Medicine | Admitting: Emergency Medicine

## 2020-06-25 DIAGNOSIS — U071 COVID-19: Secondary | ICD-10-CM

## 2020-06-25 DIAGNOSIS — R7303 Prediabetes: Secondary | ICD-10-CM | POA: Insufficient documentation

## 2020-06-25 DIAGNOSIS — I10 Essential (primary) hypertension: Secondary | ICD-10-CM | POA: Insufficient documentation

## 2020-06-25 LAB — RESP PANEL BY RT-PCR (FLU A&B, COVID) ARPGX2
Influenza A by PCR: NEGATIVE
Influenza B by PCR: NEGATIVE
SARS Coronavirus 2 by RT PCR: POSITIVE — AB

## 2020-06-25 MED ORDER — FAMOTIDINE IN NACL 20-0.9 MG/50ML-% IV SOLN
20.0000 mg | Freq: Once | INTRAVENOUS | Status: DC | PRN
  Filled 2020-06-25: qty 50

## 2020-06-25 MED ORDER — SODIUM CHLORIDE 0.9 % IV SOLN: 1200.0000 mg | Freq: Once | INTRAVENOUS | Status: DC

## 2020-06-25 MED ORDER — DIPHENHYDRAMINE HCL 50 MG/ML IJ SOLN: 50.0000 mg | Freq: Once | INTRAMUSCULAR | Status: DC | PRN

## 2020-06-25 MED ORDER — METHYLPREDNISOLONE SODIUM SUCC 125 MG IJ SOLR: 125.0000 mg | Freq: Once | INTRAMUSCULAR | Status: DC | PRN

## 2020-06-25 MED ORDER — EPINEPHRINE 0.3 MG/0.3ML IJ SOAJ: 0.3000 mg | Freq: Once | INTRAMUSCULAR | Status: DC | PRN

## 2020-06-25 MED ORDER — SODIUM CHLORIDE 0.9 % IV SOLN
Freq: Once | INTRAVENOUS | Status: AC
  Filled 2020-06-25: qty 20

## 2020-06-25 MED ORDER — ALBUTEROL SULFATE HFA 108 (90 BASE) MCG/ACT IN AERS: 2.0000 | INHALATION_SPRAY | Freq: Once | RESPIRATORY_TRACT | Status: DC | PRN

## 2020-06-25 MED ORDER — SODIUM CHLORIDE 0.9 % IV BOLUS
500.0000 mL | Freq: Once | INTRAVENOUS | Status: AC
  Administered 2020-06-25: 23:00:00 500 mL via INTRAVENOUS

## 2020-06-25 MED ORDER — SODIUM CHLORIDE 0.9 % IV SOLN: INTRAVENOUS | Status: DC | PRN

## 2020-06-25 NOTE — ED Notes (Signed)
Pt seen leaving ED entrance. Pt stated "going home".

## 2020-06-25 NOTE — ED Triage Notes (Signed)
Pt presents to ED POV. Interpretor used for triage: Pt reports he has productive cough, denies resp issues. Pt want to be tested for covid. resp e/u

## 2020-06-25 NOTE — ED Provider Notes (Signed)
Emergency Department Provider Note   I have reviewed the triage vital signs and the nursing notes.   HISTORY  Chief Complaint No chief complaint on file.  Arabic interpreter used for encounter (video)  HPI Brandon DOYNE MICKE is a 79 y.o. male with past medical history reviewed below presents to the emergency department with 9 days of COVID-19-like symptoms.  He is developed productive cough with fatigue and occasional fever.  He denies any chest pain or specific shortness of breath.  He denies any known sick contacts.  He presents with concern for possibly contracting COVID-19.  He did check into the emergency department yesterday with triage nursing note describing dizziness with constipation and increased thirst but patient states that he has been able to drink fluids and that the symptoms have improved overall.  He was not evaluated by a physician yesterday after he LWBS.   Past Medical History:  Diagnosis Date  . Acid reflux   . BPH (benign prostatic hyperplasia)   . Hiatal hernia   . Hyperlipidemia   . Hypertension   . Pre-diabetes   . Sleep apnea    sometimes does not use d/t poor fitting of the mask  . Ulcer     Patient Active Problem List   Diagnosis Date Noted  . S/P Nissen fundoplication (without gastrostomy tube) procedure 06/02/2017  . Dementia (HCC) 05/12/2017  . Prediabetes 01/30/2017  . Hiatal hernia 01/27/2017  . Acid reflux 02/12/2016  . Sleep apnea 02/12/2016  . Diastasis recti 03/02/2015  . Umbilical hernia 09/16/2014  . BPV (benign positional vertigo) 07/01/2014  . HTN (hypertension) 07/01/2014  . Panic attack   . Encephalopathy, hypertensive   . Ataxia 06/30/2014  . Vertigo 06/30/2014  . Dyspepsia 04/03/2014  . Encounter for colorectal cancer screening 04/03/2014  . Bilateral shoulder pain 09/17/2013  . Bilateral knee pain 02/05/2013    Past Surgical History:  Procedure Laterality Date  . ESOPHAGEAL MANOMETRY N/A 04/27/2016    Procedure: ESOPHAGEAL MANOMETRY (EM) with PH;  Surgeon: Midge Minium, MD;  Location: [facility] ENDOSCOPY;  Service: Endoscopy;  Laterality: N/A;  . ESOPHAGEAL MANOMETRY N/A 04/19/2017   Procedure: ESOPHAGEAL MANOMETRY (EM);  Surgeon: Charlott Rakes, MD;  Location: [facility]OS[facility];  Service: Endoscopy;  Laterality: N/A;  . FACIAL LACERATIONS REPAIR    . INSERTION OF MESH N/A 06/02/2017   Procedure: INSERTION OF MESH;  Surgeon: Axel Filler, MD;  Location: [facility] ORS;  Service: General;  Laterality: N/A;    Allergies Patient has no known allergies.  Family History  Problem Relation Age of Onset  . Diabetes Mother   . Diabetes Brother   . Heart attack Neg Hx   . Hyperlipidemia Neg Hx   . Hypertension Neg Hx   . Sudden death Neg Hx     Social History Social History   Tobacco Use  . Smoking status: Never Smoker  . Smokeless tobacco: Never Used  Vaping Use  . Vaping Use: Never used  Substance Use Topics  . Alcohol use: No    Alcohol/week: 0.0 standard drinks  . Drug use: No    Review of Systems  Constitutional: Positive fever/chills and fatigue.  Eyes: No visual changes. ENT: No sore throat. Cardiovascular: Denies chest pain. Respiratory: Denies shortness of breath. Positive cough.  Gastrointestinal: No abdominal pain.  No nausea, no vomiting.  No diarrhea.  No constipation. Genitourinary: Negative for dysuria. Musculoskeletal: Negative for back pain. Skin: Negative for rash. Neurological: Negative for headaches, focal weakness or numbness.  10-point ROS  otherwise negative.  ____________________________________________   PHYSICAL EXAM:  VITAL SIGNS: ED Triage Vitals  Enc Vitals Group     BP 06/25/20 1727 137/85     Pulse Rate 06/25/20 1727 (!) 107     Resp 06/25/20 1727 16     Temp 06/25/20 1727 98.7 F (37.1 C)     Temp Source 06/25/20 1727 Oral     SpO2 06/25/20 1727 98 %     Pain Score 06/25/20 1735 0   Constitutional: Alert and oriented. Well appearing and  in no acute distress. Eyes: Conjunctivae are normal.  Head: Atraumatic. Nose: No congestion/rhinnorhea. Mouth/Throat: Mucous membranes are slightly dry.  Neck: No stridor.   Cardiovascular: Tachycardia. Good peripheral circulation. Grossly normal heart sounds.   Respiratory: Normal respiratory effort.  No retractions. Lungs CTAB. Gastrointestinal: Soft and nontender. No distention.  Musculoskeletal: No lower extremity tenderness nor edema. No gross deformities of extremities. Neurologic:  Normal speech and language. No gross focal neurologic deficits are appreciated.  Skin:  Skin is warm, dry and intact. No rash noted.  ____________________________________________   LABS (all labs ordered are listed, but only abnormal results are displayed)  Labs Reviewed  RESP PANEL BY RT-PCR (FLU A&B, COVID) ARPGX2 - Abnormal; Notable for the following components:      Result Value   SARS Coronavirus 2 by RT PCR POSITIVE (*)    All other components within normal limits    ____________________________________________   PROCEDURES  Procedure(s) performed:   Procedures  None ____________________________________________   INITIAL IMPRESSION / ASSESSMENT AND PLAN / ED COURSE  Pertinent labs & imaging results that were available during my care of the patient were reviewed by me and considered in my medical decision making (see chart for details).   Patient presents to the emergency department with COVID-19-like symptoms.  He is overall well-appearing and in no respiratory distress.  Vital signs in triage show mild tachycardia but no hypoxemia or hypotension.  Patient had labs drawn while waiting to be seen yesterday but ultimately left.  I reviewed these which showed no acute abnormality.  His COVID-19 test today is positive.  Flu negative.  Lungs are clear to auscultation bilaterally.  He is currently day 9 of symptoms.  We discussed monoclonal antibody infusion.  He is unvaccinated.  He does  meet criteria.  Using the Arabic interpreter he provided verbal consent to the monoclonal antibodies.  We will give small IV fluid bolus while waiting here with mild dehydration clinically.  Vitals improved. MAB infusion given. Plan for discharge.  ____________________________________________  FINAL CLINICAL IMPRESSION(S) / ED DIAGNOSES  Final diagnoses:  COVID-19     MEDICATIONS GIVEN DURING THIS VISIT:  Medications  bamlanivimab 700 mg, etesevimab 1,400 mg in sodium chloride 0.9 % 160 mL IVPB ( Intravenous Stopped 06/26/20 0023)  sodium chloride 0.9 % bolus 500 mL (0 mLs Intravenous Stopped 06/26/20 0023)    Note:  This document was prepared using Dragon voice recognition software and may include unintentional dictation errors.  Alona Bene, MD, [facility] Emergency Medicine    Tayanna Talford, Arlyss Repress, MD 06/28/20 2001

## 2020-06-25 NOTE — Discharge Instructions (Signed)
Person Under Monitoring Name: Brandon Clark  Location: [address] [place] [address]   Infection Prevention Recommendations for Individuals Confirmed to have, or Being Evaluated for, 2019 Novel Coronavirus (COVID-19) Infection Who Receive Care at Home  Individuals who are confirmed to have, or are being evaluated for, COVID-19 should follow the prevention steps below until a healthcare provider or local or state health department says they can return to normal activities.  Stay home except to get medical care You should restrict activities outside your home, except for getting medical care. Do not go to work, school, or public areas, and do not use public transportation or taxis.  Call ahead before visiting your doctor Before your medical appointment, call the healthcare provider and tell them that you have, or are being evaluated for, COVID-19 infection. This will help the healthcare provider's office take steps to keep other people from getting infected. Ask your healthcare provider to call the local or state health department.  Monitor your symptoms Seek prompt medical attention if your illness is worsening (e.g., difficulty breathing). Before going to your medical appointment, call the healthcare provider and tell them that you have, or are being evaluated for, COVID-19 infection. Ask your healthcare provider to call the local or state health department.  Wear a facemask You should wear a facemask that covers your nose and mouth when you are in the same room with other people and when you visit a healthcare provider. People who live with or visit you should also wear a facemask while they are in the same room with you.  Separate yourself from other people in your home As much as possible, you should stay in a different room from other people in your home. Also, you should use a separate bathroom, if available.  Avoid sharing household items You  should not share dishes, drinking glasses, cups, eating utensils, towels, bedding, or other items with other people in your home. After using these items, you should wash them thoroughly with soap and water.  Cover your coughs and sneezes Cover your mouth and nose with a tissue when you cough or sneeze, or you can cough or sneeze into your sleeve. Throw used tissues in a lined trash can, and immediately wash your hands with soap and water for at least 20 seconds or use an alcohol-based hand rub.  Wash your [employer] your hands often and thoroughly with soap and water for at least 20 seconds. You can use an alcohol-based hand sanitizer if soap and water are not available and if your hands are not visibly dirty. Avoid touching your eyes, nose, and mouth with unwashed hands.   Prevention Steps for Caregivers and Household Members of Individuals Confirmed to have, or Being Evaluated for, COVID-19 Infection Being Cared for in the Home  If you live with, or provide care at home for, a person confirmed to have, or being evaluated for, COVID-19 infection please follow these guidelines to prevent infection:  Follow healthcare provider's instructions Make sure that you understand and can help the patient follow any healthcare provider instructions for all care.  Provide for the patient's basic needs You should help the patient with basic needs in the home and provide support for getting groceries, prescriptions, and other personal needs.  Monitor the patient's symptoms If they are getting sicker, call his or her medical provider and tell them that the patient has, or is being evaluated for, COVID-19 infection. This will help the healthcare provider's  office take steps to keep other people from getting infected. Ask the healthcare provider to call the local or state health department.  Limit the number of people who have contact with the patient If possible, have only one caregiver for the  patient. Other household members should stay in another home or place of residence. If this is not possible, they should stay in another room, or be separated from the patient as much as possible. Use a separate bathroom, if available. Restrict visitors who do not have an essential need to be in the home.  Keep older adults, very young children, and other sick people away from the patient Keep older adults, very young children, and those who have compromised immune systems or chronic health conditions away from the patient. This includes people with chronic heart, lung, or kidney conditions, diabetes, and cancer.  Ensure good ventilation Make sure that shared spaces in the home have good air flow, such as from an air conditioner or an opened window, weather permitting.  Wash your hands often Wash your hands often and thoroughly with soap and water for at least 20 seconds. You can use an alcohol based hand sanitizer if soap and water are not available and if your hands are not visibly dirty. Avoid touching your eyes, nose, and mouth with unwashed hands. Use disposable paper towels to dry your hands. If not available, use dedicated cloth towels and replace them when they become wet.  Wear a facemask and gloves Wear a disposable facemask at all times in the room and gloves when you touch or have contact with the patient's blood, body fluids, and/or secretions or excretions, such as sweat, saliva, sputum, nasal mucus, vomit, urine, or feces.  Ensure the mask fits over your nose and mouth tightly, and do not touch it during use. Throw out disposable facemasks and gloves after using them. Do not reuse. Wash your hands immediately after removing your facemask and gloves. If your personal clothing becomes contaminated, carefully remove clothing and launder. Wash your hands after handling contaminated clothing. Place all used disposable facemasks, gloves, and other waste in a lined container before  disposing them with other household waste. Remove gloves and wash your hands immediately after handling these items.  Do not share dishes, glasses, or other household items with the patient Avoid sharing household items. You should not share dishes, drinking glasses, cups, eating utensils, towels, bedding, or other items with a patient who is confirmed to have, or being evaluated for, COVID-19 infection. After the person uses these items, you should wash them thoroughly with soap and water.  Wash laundry thoroughly Immediately remove and wash clothes or bedding that have blood, body fluids, and/or secretions or excretions, such as sweat, saliva, sputum, nasal mucus, vomit, urine, or feces, on them. Wear gloves when handling laundry from the patient. Read and follow directions on labels of laundry or clothing items and detergent. In general, wash and dry with the warmest temperatures recommended on the label.  Clean all areas the individual has used often Clean all touchable surfaces, such as counters, tabletops, doorknobs, bathroom fixtures, toilets, phones, keyboards, tablets, and bedside tables, every day. Also, clean any surfaces that may have blood, body fluids, and/or secretions or excretions on them. Wear gloves when cleaning surfaces the patient has come in contact with. Use a diluted bleach solution (e.g., dilute bleach with 1 part bleach and 10 parts water) or a household disinfectant with a label that says EPA-registered for coronaviruses. To make a  bleach solution at home, add 1 tablespoon of bleach to 1 quart (4 cups) of water. For a larger supply, add  cup of bleach to 1 gallon (16 cups) of water. Read labels of cleaning products and follow recommendations provided on product labels. Labels contain instructions for safe and effective use of the cleaning product including precautions you should take when applying the product, such as wearing gloves or eye protection and making sure you  have good ventilation during use of the product. Remove gloves and wash hands immediately after cleaning.  Monitor yourself for signs and symptoms of illness Caregivers and household members are considered close contacts, should monitor their health, and will be asked to limit movement outside of the home to the extent possible. Follow the monitoring steps for close contacts listed on the symptom monitoring form.   ? If you have additional questions, contact your local health department or call the epidemiologist on call at [phone] (available 24/7). ? This guidance is subject to change. For the most up-to-date guidance from [facility], please refer to their website: YouBlogs.pl

## 2020-06-29 ENCOUNTER — Telehealth: Payer: Self-pay

## 2020-06-29 NOTE — Telephone Encounter (Signed)
Transition Care Management Unsuccessful Follow-up Telephone Call  Date of discharge and from where:  06/25/2020 from [facility]  Attempts:  1st Attempt  Reason for unsuccessful TCM follow-up call:  Left voice message

## 2020-06-30 NOTE — Telephone Encounter (Signed)
Transition Care Management Unsuccessful Follow-up Telephone Call  Date of discharge and from where:  06/25/2020 [facility]  Attempts:  2nd Attempt  Reason for unsuccessful TCM follow-up call:  Left voice message

## 2020-07-01 NOTE — Telephone Encounter (Signed)
Transition Care Management Unsuccessful Follow-up Telephone Call  Date of discharge and from where:  06/25/2020 [facility]  Attempts:  3rd Attempt  Reason for unsuccessful TCM follow-up call:  Left voice message

## 2020-07-07 DIAGNOSIS — R059 Cough, unspecified: Secondary | ICD-10-CM | POA: Diagnosis not present

## 2020-07-20 ENCOUNTER — Ambulatory Visit: Payer: Medicaid Other | Admitting: Orthopedic Surgery

## 2020-07-27 ENCOUNTER — Other Ambulatory Visit: Payer: Self-pay

## 2020-07-27 ENCOUNTER — Ambulatory Visit (INDEPENDENT_AMBULATORY_CARE_PROVIDER_SITE_OTHER): Payer: Medicaid Other | Admitting: Orthopedic Surgery

## 2020-07-27 DIAGNOSIS — M170 Bilateral primary osteoarthritis of knee: Secondary | ICD-10-CM | POA: Diagnosis not present

## 2020-07-28 ENCOUNTER — Encounter: Payer: Self-pay | Admitting: Orthopedic Surgery

## 2020-07-28 DIAGNOSIS — M170 Bilateral primary osteoarthritis of knee: Secondary | ICD-10-CM

## 2020-07-28 MED ORDER — METHYLPREDNISOLONE ACETATE 40 MG/ML IJ SUSP
40.0000 mg | INTRAMUSCULAR | Status: AC | PRN
  Administered 2020-07-28: 40 mg via INTRA_ARTICULAR

## 2020-07-28 MED ORDER — BUPIVACAINE HCL 0.25 % IJ SOLN
4.0000 mL | INTRAMUSCULAR | Status: AC | PRN
  Administered 2020-07-28: 4 mL via INTRA_ARTICULAR

## 2020-07-28 MED ORDER — LIDOCAINE HCL 1 % IJ SOLN
5.0000 mL | INTRAMUSCULAR | Status: AC | PRN
  Administered 2020-07-28: 5 mL

## 2020-07-28 NOTE — Progress Notes (Signed)
Office Visit Note   Patient: Brandon Clark           Date of Birth: 1940/09/27           MRN: [id number] Visit Date: 07/27/2020 Requested by: No referring provider defined for this encounter. PCP: Patient, No Pcp Per  Subjective: Chief Complaint  Patient presents with  . Follow-up    HPI: Patient presents for evaluation of bilateral knee pain.  Has known history of bilateral knee arthritis.  Has done well with injections in the past.  Does not want total knee replacement.  Also did reasonably well with the shoulder injections.  He is here with a translator today.  No real mechanical symptoms in the knee just pain.              ROS: All systems reviewed are negative as they relate to the chief complaint within the history of present illness.  Patient denies  fevers or chills.   Assessment & Plan: Visit Diagnoses:  1. Primary osteoarthritis of both knees     Plan: Impression is bilateral knee pain and arthritis with mild varus alignment and less effusion today than he has had in the past.  Plan is bilateral knee aspiration and injection.  He may decide to get his shoulders injected at sometime in the future when his symptoms recur.  He also does not want any type of surgery on the shoulders.  Overall he is doing well with episodic injections.  Continue with nonweightbearing quad strengthening exercises and below shoulder level rotator cuff strengthening exercises and follow-up as needed.  Bilateral knee injection performed today.  Patient tolerated seizure well.  Follow-Up Instructions: Return if symptoms worsen or fail to improve.   Orders:  No orders of the defined types were placed in this encounter.  No orders of the defined types were placed in this encounter.     Procedures: Large Joint Inj: bilateral knee on 07/28/2020 9:59 PM Indications: diagnostic evaluation, joint swelling and pain Details: 18 G 1.5 in needle, superolateral approach  Arthrogram:  No  Medications (Right): 5 mL lidocaine 1 %; 4 mL bupivacaine 0.25 %; 40 mg methylPREDNISolone acetate 40 MG/ML Medications (Left): 5 mL lidocaine 1 %; 4 mL bupivacaine 0.25 %; 40 mg methylPREDNISolone acetate 40 MG/ML Outcome: tolerated well, no immediate complications Procedure, treatment alternatives, risks and benefits explained, specific risks discussed. Consent was given by the patient. Immediately prior to procedure a time out was called to verify the correct patient, procedure, equipment, support staff and site/side marked as required. Patient was prepped and draped in the usual sterile fashion.     This patient is diagnosed with osteoarthritis of the knee(s).    Radiographs show evidence of joint space narrowing, osteophytes, subchondral sclerosis and/or subchondral cysts.  This patient has knee pain which interferes with functional and activities of daily living.    This patient has experienced inadequate response, adverse effects and/or intolerance with conservative treatments such as acetaminophen, NSAIDS, topical creams, physical therapy or regular exercise, knee bracing and/or weight loss.   This patient has experienced inadequate response or has a contraindication to intra articular steroid injections for at least 3 months.   This patient is not scheduled to have a total knee replacement within 6 months of starting treatment with viscosupplementation.   Clinical Data: No additional findings.  Objective: Vital Signs: There were no vitals taken for this visit.  Physical Exam:   Constitutional: Patient appears well-developed HEENT:  Head: Normocephalic Eyes:EOM  are normal Neck: Normal range of motion Cardiovascular: Normal rate Pulmonary/chest: Effort normal Neurologic: Patient is alert Skin: Skin is warm Psychiatric: Patient has normal mood and affect    Ortho Exam: Ortho exam demonstrates mild varus alignment.  Mild effusion both knees.  Pedal pulses palpable.   Has about a 5 to 8 degree flexion contracture in both knees with intact extensor mechanism and stable collateral cruciate ligaments.  No other masses lymphadenopathy or skin changes noted in that knee region.  Extensor mechanism is intact.  Collateral and cruciate ligaments are stable.  Specialty Comments:  No specialty comments available.  Imaging: No results found.   PMFS History: Patient Active Problem List   Diagnosis Date Noted  . S/P Nissen fundoplication (without gastrostomy tube) procedure 06/02/2017  . Dementia (HCC) 05/12/2017  . Prediabetes 01/30/2017  . Hiatal hernia 01/27/2017  . Acid reflux 02/12/2016  . Sleep apnea 02/12/2016  . Diastasis recti 03/02/2015  . Umbilical hernia 09/16/2014  . BPV (benign positional vertigo) 07/01/2014  . HTN (hypertension) 07/01/2014  . Panic attack   . Encephalopathy, hypertensive   . Ataxia 06/30/2014  . Vertigo 06/30/2014  . Dyspepsia 04/03/2014  . Encounter for colorectal cancer screening 04/03/2014  . Bilateral shoulder pain 09/17/2013  . Bilateral knee pain 02/05/2013   Past Medical History:  Diagnosis Date  . Acid reflux   . BPH (benign prostatic hyperplasia)   . Hiatal hernia   . Hyperlipidemia   . Hypertension   . Pre-diabetes   . Sleep apnea    sometimes does not use d/t poor fitting of the mask  . Ulcer     Family History  Problem Relation Age of Onset  . Diabetes Mother   . Diabetes Brother   . Heart attack Neg Hx   . Hyperlipidemia Neg Hx   . Hypertension Neg Hx   . Sudden death Neg Hx     Past Surgical History:  Procedure Laterality Date  . ESOPHAGEAL MANOMETRY N/A 04/27/2016   Procedure: ESOPHAGEAL MANOMETRY (EM) with PH;  Surgeon: Midge Minium, MD;  Location: [facility];  Service: Endoscopy;  Laterality: N/A;  . ESOPHAGEAL MANOMETRY N/A 04/19/2017   Procedure: ESOPHAGEAL MANOMETRY (EM);  Surgeon: Charlott Rakes, MD;  Location: [facility];  Service: Endoscopy;  Laterality: N/A;  . FACIAL  LACERATIONS REPAIR    . INSERTION OF MESH N/A 06/02/2017   Procedure: INSERTION OF MESH;  Surgeon: Axel Filler, MD;  Location: [facility];  Service: General;  Laterality: N/A;   Social History   Occupational History  . Occupation: Art gallery manager  Tobacco Use  . Smoking status: Never Smoker  . Smokeless tobacco: Never Used  Vaping Use  . Vaping Use: Never used  Substance and Sexual Activity  . Alcohol use: No    Alcohol/week: 0.0 standard drinks  . Drug use: No  . Sexual activity: Never

## 2020-07-29 NOTE — Progress Notes (Signed)
Noted.   Will mail patient [employer] patient assistance application due to patient having medicaid for Monovisc, bilateral knee.

## 2020-08-03 ENCOUNTER — Telehealth: Payer: Medicaid Other

## 2020-08-03 NOTE — Telephone Encounter (Signed)
Mailed patient [employer] patient assistance application to address listed for Monovisc, bilateral knee due to having Medicaid.

## 2020-08-04 DIAGNOSIS — R413 Other amnesia: Secondary | ICD-10-CM | POA: Diagnosis not present

## 2020-08-04 DIAGNOSIS — E781 Pure hyperglyceridemia: Secondary | ICD-10-CM | POA: Diagnosis not present

## 2020-08-04 DIAGNOSIS — N401 Enlarged prostate with lower urinary tract symptoms: Secondary | ICD-10-CM | POA: Diagnosis not present

## 2020-08-04 DIAGNOSIS — I10 Essential (primary) hypertension: Secondary | ICD-10-CM | POA: Diagnosis not present

## 2020-08-04 DIAGNOSIS — Z0000 Encounter for general adult medical examination without abnormal findings: Secondary | ICD-10-CM | POA: Diagnosis not present

## 2020-08-04 DIAGNOSIS — F5101 Primary insomnia: Secondary | ICD-10-CM | POA: Diagnosis not present

## 2020-08-04 DIAGNOSIS — K429 Umbilical hernia without obstruction or gangrene: Secondary | ICD-10-CM | POA: Diagnosis not present

## 2020-08-04 DIAGNOSIS — R350 Frequency of micturition: Secondary | ICD-10-CM | POA: Diagnosis not present

## 2020-08-05 ENCOUNTER — Ambulatory Visit: Payer: Medicaid Other | Admitting: Orthopedic Surgery

## 2020-09-02 ENCOUNTER — Ambulatory Visit: Payer: Medicaid Other | Admitting: Orthopedic Surgery

## 2020-12-10 DIAGNOSIS — I10 Essential (primary) hypertension: Secondary | ICD-10-CM | POA: Diagnosis not present

## 2020-12-10 DIAGNOSIS — F5101 Primary insomnia: Secondary | ICD-10-CM | POA: Diagnosis not present

## 2020-12-10 DIAGNOSIS — R350 Frequency of micturition: Secondary | ICD-10-CM | POA: Diagnosis not present

## 2020-12-10 DIAGNOSIS — R413 Other amnesia: Secondary | ICD-10-CM | POA: Diagnosis not present

## 2020-12-10 DIAGNOSIS — E781 Pure hyperglyceridemia: Secondary | ICD-10-CM | POA: Diagnosis not present

## 2020-12-10 DIAGNOSIS — G4730 Sleep apnea, unspecified: Secondary | ICD-10-CM | POA: Diagnosis not present

## 2020-12-10 DIAGNOSIS — N401 Enlarged prostate with lower urinary tract symptoms: Secondary | ICD-10-CM | POA: Diagnosis not present

## 2021-01-29 ENCOUNTER — Other Ambulatory Visit: Payer: Self-pay

## 2021-01-29 ENCOUNTER — Encounter: Payer: Self-pay | Admitting: Surgical

## 2021-01-29 ENCOUNTER — Ambulatory Visit: Payer: Medicaid Other | Admitting: Surgical

## 2021-01-29 DIAGNOSIS — M170 Bilateral primary osteoarthritis of knee: Secondary | ICD-10-CM

## 2021-01-29 MED ORDER — LIDOCAINE HCL 1 % IJ SOLN
5.0000 mL | INTRAMUSCULAR | Status: AC | PRN
  Administered 2021-01-29: 5 mL

## 2021-01-29 MED ORDER — METHYLPREDNISOLONE ACETATE 40 MG/ML IJ SUSP
40.0000 mg | INTRAMUSCULAR | Status: AC | PRN
Start: 2021-01-29 — End: 2021-01-29
  Administered 2021-01-29: 40 mg via INTRA_ARTICULAR

## 2021-01-29 MED ORDER — BUPIVACAINE HCL 0.25 % IJ SOLN
4.0000 mL | INTRAMUSCULAR | Status: AC | PRN
  Administered 2021-01-29: 4 mL via INTRA_ARTICULAR

## 2021-01-29 MED ORDER — METHYLPREDNISOLONE ACETATE 40 MG/ML IJ SUSP
40.0000 mg | INTRAMUSCULAR | Status: AC | PRN
  Administered 2021-01-29: 40 mg via INTRA_ARTICULAR

## 2021-01-29 NOTE — Progress Notes (Signed)
Office Visit Note   Patient: Brandon Clark           Date of Birth: 06/21/1941           MRN: [id number] Visit Date: 01/29/2021 Requested by: No referring provider defined for this encounter. PCP: Patient, No Pcp Per (Inactive)  Subjective: Chief Complaint  Patient presents with   Right Knee - Pain   Left Knee - Pain    HPI: Brandon Clark is a 80 y.o. male who presents to the office complaining of bilateral knee pain.  Patient complains of continued bilateral knee pain.  He has a history of bilateral knee osteoarthritis.  His left knee bothers him more than his right.  They occasionally feel like they want to give out on him.  He is doing well with these injections and they provide about 3 months of relief.  Does not want to consider surgery at this time.              ROS: All systems reviewed are negative as they relate to the chief complaint within the history of present illness.  Patient denies fevers or chills.  Assessment & Plan: Visit Diagnoses:  1. Primary osteoarthritis of both knees     Plan: Patient is a 80 year old male who presents complaining of bilateral knee pain.  Has history of bilateral knee arthritis.  Is here today for bilateral knee injections.  Tolerated the procedure well.  Plan for him to follow-up with the office in 10 days for shoulder injections which he states he has had in the past with Dr. August Saucer.  Follow-Up Instructions: No follow-ups on file.   Orders:  No orders of the defined types were placed in this encounter.  No orders of the defined types were placed in this encounter.     Procedures: Large Joint Inj: bilateral knee on 01/29/2021 5:00 PM Indications: diagnostic evaluation, joint swelling and pain Details: 18 G 1.5 in needle, superolateral approach  Arthrogram: No  Medications (Right): 5 mL lidocaine 1 %; 4 mL bupivacaine 0.25 %; 40 mg methylPREDNISolone acetate 40 MG/ML Medications (Left): 5 mL lidocaine 1 %; 4  mL bupivacaine 0.25 %; 40 mg methylPREDNISolone acetate 40 MG/ML Aspirate (Left): 30 mL Outcome: tolerated well, no immediate complications Procedure, treatment alternatives, risks and benefits explained, specific risks discussed. Consent was given by the patient. Immediately prior to procedure a time out was called to verify the correct patient, procedure, equipment, support staff and site/side marked as required. Patient was prepped and draped in the usual sterile fashion.      Clinical Data: No additional findings.  Objective: Vital Signs: There were no vitals taken for this visit.  Physical Exam:  Constitutional: Patient appears well-developed HEENT:  Head: Normocephalic Eyes:EOM are normal Neck: Normal range of motion Cardiovascular: Normal rate Pulmonary/chest: Effort normal Neurologic: Patient is alert Skin: Skin is warm Psychiatric: Patient has normal mood and affect  Ortho Exam: Ortho exam demonstrates bilateral knees with effusion on left knee.  Tenderness over the medial lateral joint lines.  Extends to 0 degrees.  No calf tenderness.  Specialty Comments:  No specialty comments available.  Imaging: No results found.   PMFS History: Patient Active Problem List   Diagnosis Date Noted   S/P Nissen fundoplication (without gastrostomy tube) procedure 06/02/2017   Dementia (HCC) 05/12/2017   Prediabetes 01/30/2017   Hiatal hernia 01/27/2017   Acid reflux 02/12/2016   Sleep apnea 02/12/2016   Diastasis recti 03/02/2015   Umbilical  hernia 09/16/2014   BPV (benign positional vertigo) 07/01/2014   HTN (hypertension) 07/01/2014   Panic attack    Encephalopathy, hypertensive    Ataxia 06/30/2014   Vertigo 06/30/2014   Dyspepsia 04/03/2014   Encounter for colorectal cancer screening 04/03/2014   Bilateral shoulder pain 09/17/2013   Bilateral knee pain 02/05/2013   Past Medical History:  Diagnosis Date   Acid reflux    BPH (benign prostatic hyperplasia)     Hiatal hernia    Hyperlipidemia    Hypertension    Pre-diabetes    Sleep apnea    sometimes does not use d/t poor fitting of the mask   Ulcer     Family History  Problem Relation Age of Onset   Diabetes Mother    Diabetes Brother    Heart attack Neg Hx    Hyperlipidemia Neg Hx    Hypertension Neg Hx    Sudden death Neg Hx     Past Surgical History:  Procedure Laterality Date   ESOPHAGEAL MANOMETRY N/A 04/27/2016   Procedure: ESOPHAGEAL MANOMETRY (EM) with PH;  Surgeon: Midge Minium, MD;  Location: [facility];  Service: Endoscopy;  Laterality: N/A;   ESOPHAGEAL MANOMETRY N/A 04/19/2017   Procedure: ESOPHAGEAL MANOMETRY (EM);  Surgeon: Charlott Rakes, MD;  Location: [facility];  Service: Endoscopy;  Laterality: N/A;   FACIAL LACERATIONS REPAIR     INSERTION OF MESH N/A 06/02/2017   Procedure: INSERTION OF MESH;  Surgeon: Axel Filler, MD;  Location: [facility];  Service: General;  Laterality: N/A;   Social History   Occupational History   Occupation: Art gallery manager  Tobacco Use   Smoking status: Never   Smokeless tobacco: Never  Vaping Use   Vaping Use: Never used  Substance and Sexual Activity   Alcohol use: No    Alcohol/week: 0.0 standard drinks   Drug use: No   Sexual activity: Never

## 2021-02-10 ENCOUNTER — Ambulatory Visit (INDEPENDENT_AMBULATORY_CARE_PROVIDER_SITE_OTHER): Payer: Medicaid Other | Admitting: Orthopedic Surgery

## 2021-02-10 ENCOUNTER — Other Ambulatory Visit: Payer: Self-pay

## 2021-02-10 DIAGNOSIS — M170 Bilateral primary osteoarthritis of knee: Secondary | ICD-10-CM

## 2021-02-10 DIAGNOSIS — M7542 Impingement syndrome of left shoulder: Secondary | ICD-10-CM

## 2021-02-10 DIAGNOSIS — M7541 Impingement syndrome of right shoulder: Secondary | ICD-10-CM

## 2021-02-13 ENCOUNTER — Encounter: Payer: Self-pay | Admitting: Orthopedic Surgery

## 2021-02-13 MED ORDER — METHYLPREDNISOLONE ACETATE 40 MG/ML IJ SUSP
40.0000 mg | INTRAMUSCULAR | Status: AC | PRN
  Administered 2021-02-10: 40 mg via INTRA_ARTICULAR

## 2021-02-13 MED ORDER — BUPIVACAINE HCL 0.5 % IJ SOLN
9.0000 mL | INTRAMUSCULAR | Status: AC | PRN
  Administered 2021-02-10: 9 mL via INTRA_ARTICULAR

## 2021-02-13 MED ORDER — LIDOCAINE HCL 1 % IJ SOLN
5.0000 mL | INTRAMUSCULAR | Status: AC | PRN
  Administered 2021-02-10: 5 mL

## 2021-02-13 NOTE — Progress Notes (Signed)
Office Visit Note   Patient: Brandon Clark           Date of Birth: Nov 13, 1940           MRN: [id number] Visit Date: 02/10/2021 Requested by: No referring provider defined for this encounter. PCP: Patient, No Pcp Per (Inactive)  Subjective: Chief Complaint  Patient presents with   Left Shoulder - Pain   Right Shoulder - Pain    HPI: Patient presents for evaluation of bilateral shoulder pain.  Has a history of bilateral shoulder bursitis with possible small rotator cuff tears.  He does not really want any surgical intervention.  Shots have helped him in the past.  He would like to have 2 injections today.              ROS: All systems reviewed are negative as they relate to the chief complaint within the history of present illness.  Patient denies  fevers or chills.   Assessment & Plan: Visit Diagnoses:  1. Primary osteoarthritis of both knees   2. Impingement syndrome of right shoulder   3. Impingement syndrome of left shoulder     Plan: Impression is bilateral shoulder impingement bursitis.  Plan is bilateral shoulder subacromial injections.  Continue with below shoulder level rotator cuff strengthening exercises and avoid lifting overhead.  Follow-up as needed.  Follow-Up Instructions: Return if symptoms worsen or fail to improve.   Orders:  No orders of the defined types were placed in this encounter.  No orders of the defined types were placed in this encounter.     Procedures: Large Joint Inj: R subacromial bursa on 02/10/2021 9:04 PM Indications: diagnostic evaluation and pain Details: 18 G 1.5 in needle, posterior approach  Arthrogram: No  Medications: 9 mL bupivacaine 0.5 %; 40 mg methylPREDNISolone acetate 40 MG/ML; 5 mL lidocaine 1 % Outcome: tolerated well, no immediate complications Procedure, treatment alternatives, risks and benefits explained, specific risks discussed. Consent was given by the patient. Immediately prior to procedure a time  out was called to verify the correct patient, procedure, equipment, support staff and site/side marked as required. Patient was prepped and draped in the usual sterile fashion.    Large Joint Inj: L subacromial bursa on 02/10/2021 9:04 PM Indications: diagnostic evaluation and pain Details: 18 G 1.5 in needle, posterior approach  Arthrogram: No  Medications: 9 mL bupivacaine 0.5 %; 40 mg methylPREDNISolone acetate 40 MG/ML; 5 mL lidocaine 1 % Outcome: tolerated well, no immediate complications Procedure, treatment alternatives, risks and benefits explained, specific risks discussed. Consent was given by the patient. Immediately prior to procedure a time out was called to verify the correct patient, procedure, equipment, support staff and site/side marked as required. Patient was prepped and draped in the usual sterile fashion.      Clinical Data: No additional findings.  Objective: Vital Signs: There were no vitals taken for this visit.  Physical Exam:   Constitutional: Patient appears well-developed HEENT:  Head: Normocephalic Eyes:EOM are normal Neck: Normal range of motion Cardiovascular: Normal rate Pulmonary/chest: Effort normal Neurologic: Patient is alert Skin: Skin is warm Psychiatric: Patient has normal mood and affect   Ortho Exam: Ortho exam demonstrates pretty reasonable rotator cuff strength bilaterally demonstrate supraspinatus subscap muscle testing.  No real restriction of external rotation at 15 degrees of abduction.  Passive range of motion bilaterally is 50/90/160.  No discrete AC joint tenderness bilaterally.  No other masses lymphadenopathy or skin changes noted bilateral shoulder region.  Specialty Comments:  No specialty comments available.  Imaging: No results found.   PMFS History: Patient Active Problem List   Diagnosis Date Noted   S/P Nissen fundoplication (without gastrostomy tube) procedure 06/02/2017   Dementia (HCC) 05/12/2017    Prediabetes 01/30/2017   Hiatal hernia 01/27/2017   Acid reflux 02/12/2016   Sleep apnea 02/12/2016   Diastasis recti 03/02/2015   Umbilical hernia 09/16/2014   BPV (benign positional vertigo) 07/01/2014   HTN (hypertension) 07/01/2014   Panic attack    Encephalopathy, hypertensive    Ataxia 06/30/2014   Vertigo 06/30/2014   Dyspepsia 04/03/2014   Encounter for colorectal cancer screening 04/03/2014   Bilateral shoulder pain 09/17/2013   Bilateral knee pain 02/05/2013   Past Medical History:  Diagnosis Date   Acid reflux    BPH (benign prostatic hyperplasia)    Hiatal hernia    Hyperlipidemia    Hypertension    Pre-diabetes    Sleep apnea    sometimes does not use d/t poor fitting of the mask   Ulcer     Family History  Problem Relation Age of Onset   Diabetes Mother    Diabetes Brother    Heart attack Neg Hx    Hyperlipidemia Neg Hx    Hypertension Neg Hx    Sudden death Neg Hx     Past Surgical History:  Procedure Laterality Date   ESOPHAGEAL MANOMETRY N/A 04/27/2016   Procedure: ESOPHAGEAL MANOMETRY (EM) with PH;  Surgeon: Midge Minium, MD;  Location: [facility];  Service: Endoscopy;  Laterality: N/A;   ESOPHAGEAL MANOMETRY N/A 04/19/2017   Procedure: ESOPHAGEAL MANOMETRY (EM);  Surgeon: Charlott Rakes, MD;  Location: [facility];  Service: Endoscopy;  Laterality: N/A;   FACIAL LACERATIONS REPAIR     INSERTION OF MESH N/A 06/02/2017   Procedure: INSERTION OF MESH;  Surgeon: Axel Filler, MD;  Location: [facility];  Service: General;  Laterality: N/A;   Social History   Occupational History   Occupation: Art gallery manager  Tobacco Use   Smoking status: Never   Smokeless tobacco: Never  Vaping Use   Vaping Use: Never used  Substance and Sexual Activity   Alcohol use: No    Alcohol/week: 0.0 standard drinks   Drug use: No   Sexual activity: Never

## 2021-02-15 DIAGNOSIS — R7303 Prediabetes: Secondary | ICD-10-CM | POA: Diagnosis not present

## 2021-02-15 DIAGNOSIS — R413 Other amnesia: Secondary | ICD-10-CM | POA: Diagnosis not present

## 2021-02-15 DIAGNOSIS — N401 Enlarged prostate with lower urinary tract symptoms: Secondary | ICD-10-CM | POA: Diagnosis not present

## 2021-02-15 DIAGNOSIS — R350 Frequency of micturition: Secondary | ICD-10-CM | POA: Diagnosis not present

## 2021-02-15 DIAGNOSIS — N3941 Urge incontinence: Secondary | ICD-10-CM | POA: Diagnosis not present

## 2021-02-15 DIAGNOSIS — I10 Essential (primary) hypertension: Secondary | ICD-10-CM | POA: Diagnosis not present

## 2021-02-15 DIAGNOSIS — F8081 Childhood onset fluency disorder: Secondary | ICD-10-CM | POA: Diagnosis not present

## 2021-02-15 DIAGNOSIS — E781 Pure hyperglyceridemia: Secondary | ICD-10-CM | POA: Diagnosis not present

## 2021-02-24 ENCOUNTER — Ambulatory Visit (INDEPENDENT_AMBULATORY_CARE_PROVIDER_SITE_OTHER): Payer: Medicaid Other | Admitting: Orthopedic Surgery

## 2021-02-24 ENCOUNTER — Other Ambulatory Visit: Payer: Self-pay

## 2021-02-24 ENCOUNTER — Encounter: Payer: Self-pay | Admitting: Orthopedic Surgery

## 2021-02-24 DIAGNOSIS — M25542 Pain in joints of left hand: Secondary | ICD-10-CM

## 2021-02-24 DIAGNOSIS — M170 Bilateral primary osteoarthritis of knee: Secondary | ICD-10-CM

## 2021-02-24 DIAGNOSIS — M1712 Unilateral primary osteoarthritis, left knee: Secondary | ICD-10-CM | POA: Diagnosis not present

## 2021-02-27 ENCOUNTER — Encounter: Payer: Self-pay | Admitting: Orthopedic Surgery

## 2021-02-27 MED ORDER — LIDOCAINE HCL 1 % IJ SOLN
5.0000 mL | INTRAMUSCULAR | Status: AC | PRN
  Administered 2021-02-24: 5 mL

## 2021-02-27 NOTE — Progress Notes (Signed)
Office Visit Note   Patient: Brandon Clark           Date of Birth: 1940-07-15           MRN: [id number] Visit Date: 02/24/2021 Requested by: No referring provider defined for this encounter. PCP: Patient, No Pcp Per (Inactive)  Subjective: Chief Complaint  Patient presents with   Left Knee - Follow-up    HPI: Patient presents for evaluation of left knee pain.  Had bilateral knee injections 01/29/2021 bilateral shoulder injections 02/10/2021.  He is concerned about recurrence of fluid in the left knee.  He has end-stage arthritis in both knees but does not want any surgery.  He also reports having had a previous left hand injury with laceration to the thenar eminence region.  Would like to have that evaluated.  That is a complex problem.              ROS: All systems reviewed are negative as they relate to the chief complaint within the history of present illness.  Patient denies  fevers or chills.   Assessment & Plan: Visit Diagnoses:  1. Primary osteoarthritis of both knees     Plan: Impression is bilateral knee and shoulder pain.  Left knee is aspirated today.  Regarding the left hand he has a healed laceration many years old in the middle of the thenar eminence.  Still has good thenar strength but no IP flexion.  I do think I feel the tendon over the proximal phalanx.  Difficult situation to assess.  Highly likely that no operative intervention is going to give him thumb flexion at this point.  Nonetheless he would like to have it investigated.  I told him I would have our hand specialist to evaluate him next month.  Follow-up with me for shoulder and knee issues as needed.  Follow-Up Instructions: Return if symptoms worsen or fail to improve, for follow up with Dr Frazier Butt for hand pain and stiffness.   Orders:  No orders of the defined types were placed in this encounter.  No orders of the defined types were placed in this encounter.     Procedures: Large Joint  Inj: L knee on 02/24/2021 7:48 PM Indications: diagnostic evaluation, joint swelling and pain Details: 18 G 1.5 in needle, superolateral approach  Arthrogram: No  Medications: 5 mL lidocaine 1 % Outcome: tolerated well, no immediate complications Procedure, treatment alternatives, risks and benefits explained, specific risks discussed. Consent was given by the patient. Immediately prior to procedure a time out was called to verify the correct patient, procedure, equipment, support staff and site/side marked as required. Patient was prepped and draped in the usual sterile fashion.      Clinical Data: No additional findings.  Objective: Vital Signs: There were no vitals taken for this visit.  Physical Exam:   Constitutional: Patient appears well-developed HEENT:  Head: Normocephalic Eyes:EOM are normal Neck: Normal range of motion Cardiovascular: Normal rate Pulmonary/chest: Effort normal Neurologic: Patient is alert Skin: Skin is warm Psychiatric: Patient has normal mood and affect   Ortho Exam: Ortho exam demonstrates mild to moderate effusion left knee no effusion right knee.  Collateral crucial he was are stable no groin pain with internal ex rotation leg.  Pedal pulses palpable.  Varus alignment present.  Left hand is examined.  Well-healed laceration in the middle of the thenar eminence.  He does have intact abductor pollicis brevis function.  IP function is absent.  No paresthesias in the median  distribution.  The FPL tendon is palpable over the proximal phalanx but he does not really have any flexion to speak of.  May be that the tendon is tethered within the scar tissue.  Difficult to assess.  Specialty Comments:  No specialty comments available.  Imaging: No results found.   PMFS History: Patient Active Problem List   Diagnosis Date Noted   S/P Nissen fundoplication (without gastrostomy tube) procedure 06/02/2017   Dementia (HCC) 05/12/2017   Prediabetes  01/30/2017   Hiatal hernia 01/27/2017   Acid reflux 02/12/2016   Sleep apnea 02/12/2016   Diastasis recti 03/02/2015   Umbilical hernia 09/16/2014   BPV (benign positional vertigo) 07/01/2014   HTN (hypertension) 07/01/2014   Panic attack    Encephalopathy, hypertensive    Ataxia 06/30/2014   Vertigo 06/30/2014   Dyspepsia 04/03/2014   Encounter for colorectal cancer screening 04/03/2014   Bilateral shoulder pain 09/17/2013   Bilateral knee pain 02/05/2013   Past Medical History:  Diagnosis Date   Acid reflux    BPH (benign prostatic hyperplasia)    Hiatal hernia    Hyperlipidemia    Hypertension    Pre-diabetes    Sleep apnea    sometimes does not use d/t poor fitting of the mask   Ulcer     Family History  Problem Relation Age of Onset   Diabetes Mother    Diabetes Brother    Heart attack Neg Hx    Hyperlipidemia Neg Hx    Hypertension Neg Hx    Sudden death Neg Hx     Past Surgical History:  Procedure Laterality Date   ESOPHAGEAL MANOMETRY N/A 04/27/2016   Procedure: ESOPHAGEAL MANOMETRY (EM) with PH;  Surgeon: Midge Minium, MD;  Location: [facility];  Service: Endoscopy;  Laterality: N/A;   ESOPHAGEAL MANOMETRY N/A 04/19/2017   Procedure: ESOPHAGEAL MANOMETRY (EM);  Surgeon: Charlott Rakes, MD;  Location: [facility];  Service: Endoscopy;  Laterality: N/A;   FACIAL LACERATIONS REPAIR     INSERTION OF MESH N/A 06/02/2017   Procedure: INSERTION OF MESH;  Surgeon: Axel Filler, MD;  Location: [facility];  Service: General;  Laterality: N/A;   Social History   Occupational History   Occupation: Art gallery manager  Tobacco Use   Smoking status: Never   Smokeless tobacco: Never  Vaping Use   Vaping Use: Never used  Substance and Sexual Activity   Alcohol use: No    Alcohol/week: 0.0 standard drinks   Drug use: No   Sexual activity: Never

## 2021-03-12 ENCOUNTER — Ambulatory Visit: Payer: Medicaid Other | Admitting: Orthopedic Surgery

## 2021-03-16 ENCOUNTER — Other Ambulatory Visit: Payer: Self-pay

## 2021-03-16 ENCOUNTER — Ambulatory Visit (INDEPENDENT_AMBULATORY_CARE_PROVIDER_SITE_OTHER): Payer: Medicaid Other

## 2021-03-16 ENCOUNTER — Ambulatory Visit (INDEPENDENT_AMBULATORY_CARE_PROVIDER_SITE_OTHER): Payer: Medicaid Other | Admitting: Orthopedic Surgery

## 2021-03-16 ENCOUNTER — Encounter: Payer: Self-pay | Admitting: Orthopedic Surgery

## 2021-03-16 DIAGNOSIS — M79642 Pain in left hand: Secondary | ICD-10-CM

## 2021-03-16 DIAGNOSIS — S56022S Laceration of flexor muscle, fascia and tendon of left thumb at forearm level, sequela: Secondary | ICD-10-CM

## 2021-03-16 NOTE — Progress Notes (Signed)
Office Visit Note   Patient: Brandon Clark           Date of Birth: Oct 15, 1940           MRN: [id number] Visit Date: 03/16/2021              Requested by: No referring provider defined for this encounter. PCP: Patient, No Pcp Per (Inactive)   Assessment & Plan: Visit Diagnoses:  1. Pain of left hand   2. Laceration of flexor muscle, fascia and tendon of left thumb at forearm level, sequela     Plan: Discussed with patient that he has a difficult problem.  We discussed that at after 8 years it will be difficult for occult to reconstruct the flexor pollicis longus.  It seems his biggest issue is actually the scar tissue in the palm.  We discussed scar massage to try to soften up the scar tissue.  We briefly discussed surgery to try to remove some of the scar tissue.  He was to see me back in 2 months and we will discuss further.  Follow-Up Instructions: No follow-ups on file.   Orders:  Orders Placed This Encounter  Procedures   XR Hand Complete Left   No orders of the defined types were placed in this encounter.     Procedures: No procedures performed   Clinical Data: No additional findings.   Subjective: Chief Complaint  Patient presents with   Left Hand - Injury    Injured left hand with thick glass 7-8 years ago, no surgery was done,can't flex thumb, hold objects, turns blue at times, + n/t, + weakness, decrease in strength, no swelling, injury was at the base of the thumb in the palm area, pain is 6/10 in the winter time.    This is a 80 year old right-hand-dominant male who presents with left thumb pain.  He had a laceration at the base of his thumb approximately 8 years ago.  Since that time has been able to actively flex the thumb IP joint.  He also has some scar tissue at the thenar eminence in the area of the laceration.  This seems to be bothersome to him.  He denies any numbness or paresthesias in this thumb.  He does describe some discoloration in  the wintertime with worsening pain then.  He says he sometimes has difficulty holding objects.  At worst his pain is 5 out of 10.  Injury   Review of Systems  Constitutional: Negative.   Respiratory: Negative.    Cardiovascular: Negative.   Neurological: Negative.     Objective: Vital Signs: There were no vitals taken for this visit.  Physical Exam Constitutional:      Appearance: Normal appearance.  Cardiovascular:     Rate and Rhythm: Normal rate.     Pulses: Normal pulses.  Pulmonary:     Effort: Pulmonary effort is normal.  Skin:    General: Skin is warm and dry.     Capillary Refill: Capillary refill takes 2 to 3 seconds.  Neurological:     General: No focal deficit present.     Mental Status: He is alert.    Left Hand Exam   Tenderness  Left hand tenderness location: Mildly TTP at distal scar margin.   Other  Erythema: absent Scars: present Sensation: normal Pulse: present  Comments:  Complex scar involving palm over thenar eminence.  Thickened scar tissue especially prominent at distal aspect of scar.  No active thumb IP motion.  Specialty Comments:  No specialty comments available.  Imaging: 3 views of the left hand taken today reviewed interpreted by me.  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  There is no evidence of foreign body or other material in the area of the remote wound.   PMFS History: Patient Active Problem List   Diagnosis Date Noted   Laceration of flexor muscle, fascia and tendon of left thumb at forearm level, sequela 03/16/2021   S/P Nissen fundoplication (without gastrostomy tube) procedure 06/02/2017   Dementia (HCC) 05/12/2017   Prediabetes 01/30/2017   Hiatal hernia 01/27/2017   Acid reflux 02/12/2016   Sleep apnea 02/12/2016   Diastasis recti 03/02/2015   Umbilical hernia 09/16/2014   BPV (benign positional vertigo) 07/01/2014   HTN (hypertension) 07/01/2014   Panic attack     Encephalopathy, hypertensive    Ataxia 06/30/2014   Vertigo 06/30/2014   Dyspepsia 04/03/2014   Encounter for colorectal cancer screening 04/03/2014   Bilateral shoulder pain 09/17/2013   Bilateral knee pain 02/05/2013   Past Medical History:  Diagnosis Date   Acid reflux    BPH (benign prostatic hyperplasia)    Hiatal hernia    Hyperlipidemia    Hypertension    Pre-diabetes    Sleep apnea    sometimes does not use d/t poor fitting of the mask   Ulcer     Family History  Problem Relation Age of Onset   Diabetes Mother    Diabetes Brother    Heart attack Neg Hx    Hyperlipidemia Neg Hx    Hypertension Neg Hx    Sudden death Neg Hx     Past Surgical History:  Procedure Laterality Date   ESOPHAGEAL MANOMETRY N/A 04/27/2016   Procedure: ESOPHAGEAL MANOMETRY (EM) with PH;  Surgeon: Midge Minium, MD;  Location: [facility];  Service: Endoscopy;  Laterality: N/A;   ESOPHAGEAL MANOMETRY N/A 04/19/2017   Procedure: ESOPHAGEAL MANOMETRY (EM);  Surgeon: Charlott Rakes, MD;  Location: [facility];  Service: Endoscopy;  Laterality: N/A;   FACIAL LACERATIONS REPAIR     INSERTION OF MESH N/A 06/02/2017   Procedure: INSERTION OF MESH;  Surgeon: Axel Filler, MD;  Location: [facility];  Service: General;  Laterality: N/A;   Social History   Occupational History   Occupation: Art gallery manager  Tobacco Use   Smoking status: Never   Smokeless tobacco: Never  Vaping Use   Vaping Use: Never used  Substance and Sexual Activity   Alcohol use: No    Alcohol/week: 0.0 standard drinks   Drug use: No   Sexual activity: Never

## 2021-03-21 ENCOUNTER — Ambulatory Visit (HOSPITAL_COMMUNITY)
Admission: EM | Admit: 2021-03-21 | Discharge: 2021-03-21 | Disposition: A | Discharge status: Home / Self Care | Payer: Medicaid Other | Attending: Emergency Medicine | Admitting: Emergency Medicine

## 2021-03-21 ENCOUNTER — Other Ambulatory Visit: Payer: Self-pay

## 2021-03-21 ENCOUNTER — Encounter (HOSPITAL_COMMUNITY): Payer: Self-pay

## 2021-03-21 DIAGNOSIS — M5432 Sciatica, left side: Secondary | ICD-10-CM | POA: Diagnosis not present

## 2021-03-21 MED ORDER — PREDNISONE 20 MG PO TABS: 40.0000 mg | ORAL_TABLET | Freq: Every day | ORAL | 0 refills | Status: AC

## 2021-03-21 NOTE — ED Triage Notes (Signed)
Pt presents with lower back pain on left side with no known injuries X 3 days.

## 2021-03-21 NOTE — Discharge Instructions (Addendum)
Take the prednisone daily for the next 5 days.  Take it in the morning.   You can take Tylenol as needed for pain.  You can use heat, ice, or alternate between heat and ice for comfort.  You can also use IcyHot, lidocaine patches, biofreeze, bengay, aspercreme, or voltaren gel as needed for pain.   Rest and drink plenty of fluids, especially water.    Follow up with orthopedics if symptoms do not improve in the next few days.

## 2021-03-21 NOTE — ED Provider Notes (Signed)
[facility]    CSN: [id number] Arrival date & time: 03/21/21  1538      History   Chief Complaint Chief Complaint  Patient presents with   Back Pain    HPI Brandon Clark is a 80 y.o. male.   Patient here for evaluation of left lower back pain that has been going on for the past 3 days.  Patient reports pain radiates down into left buttocks.  Reports using a pain patch with minimal relief.  Reports pain is worse with movement.  Has not tried any OTC pain medication.  Denies any dysuria, urgency, or frequency.  Denies any trauma, injury, or other precipitating event.  Denies any specific alleviating or aggravating factors.  Denies any fevers, chest pain, shortness of breath, N/V/D, numbness, tingling, weakness, abdominal pain, or headaches.    The history is provided by the patient.  Back Pain Associated symptoms: no dysuria    Past Medical History:  Diagnosis Date   Acid reflux    BPH (benign prostatic hyperplasia)    Hiatal hernia    Hyperlipidemia    Hypertension    Pre-diabetes    Sleep apnea    sometimes does not use d/t poor fitting of the mask   Ulcer     Patient Active Problem List   Diagnosis Date Noted   Laceration of flexor muscle, fascia and tendon of left thumb at forearm level, sequela 03/16/2021   S/P Nissen fundoplication (without gastrostomy tube) procedure 06/02/2017   Dementia (HCC) 05/12/2017   Prediabetes 01/30/2017   Hiatal hernia 01/27/2017   Acid reflux 02/12/2016   Sleep apnea 02/12/2016   Diastasis recti 03/02/2015   Umbilical hernia 09/16/2014   BPV (benign positional vertigo) 07/01/2014   HTN (hypertension) 07/01/2014   Panic attack    Encephalopathy, hypertensive    Ataxia 06/30/2014   Vertigo 06/30/2014   Dyspepsia 04/03/2014   Encounter for colorectal cancer screening 04/03/2014   Bilateral shoulder pain 09/17/2013   Bilateral knee pain 02/05/2013    Past Surgical History:  Procedure Laterality Date    ESOPHAGEAL MANOMETRY N/A 04/27/2016   Procedure: ESOPHAGEAL MANOMETRY (EM) with PH;  Surgeon: Midge Minium, MD;  Location: [facility];  Service: Endoscopy;  Laterality: N/A;   ESOPHAGEAL MANOMETRY N/A 04/19/2017   Procedure: ESOPHAGEAL MANOMETRY (EM);  Surgeon: Charlott Rakes, MD;  Location: [facility];  Service: Endoscopy;  Laterality: N/A;   FACIAL LACERATIONS REPAIR     INSERTION OF MESH N/A 06/02/2017   Procedure: INSERTION OF MESH;  Surgeon: Axel Filler, MD;  Location: [facility];  Service: General;  Laterality: N/A;       Home Medications    Prior to Admission medications   Medication Sig Start Date End Date Taking? Authorizing Provider  predniSONE (DELTASONE) 20 MG tablet Take 2 tablets (40 mg total) by mouth daily for 5 days. 03/21/21 03/26/21 Yes Ivette Loyal, NP  acetaminophen (TYLENOL) 500 MG tablet Take 500-1,000 mg by mouth every 6 (six) hours as needed.    [provider]  aspirin EC 325 MG tablet Take 1 tablet (325 mg total) by mouth daily. Patient not taking: Reported on 10/16/2019 06/09/17   Everlene Farrier, PA-C  donepezil (ARICEPT) 10 MG tablet Take 1 tablet (10 mg total) by mouth at bedtime. Patient not taking: Reported on 10/16/2019 06/12/18   Hoy Register, MD  finasteride (PROSCAR) 5 MG tablet Take 1 tablet (5 mg total) by mouth daily. 10/16/19   Hoy Register, MD  hydrochlorothiazide (HYDRODIURIL)  12.5 MG tablet Take 1 tablet (12.5 mg total) by mouth daily. Patient not taking: Reported on 10/16/2019 06/12/18   Hoy Register, MD  HYDROcodone-acetaminophen (NORCO/VICODIN) 5-325 MG tablet Take 1 tablet by mouth every 6 (six) hours as needed. Patient not taking: Reported on 10/16/2019 03/04/19   Hedges, Tinnie Gens, PA-C  meclizine (ANTIVERT) 25 MG tablet Take 1 tablet (25 mg total) by mouth 2 (two) times daily as needed. Patient not taking: Reported on 10/16/2019 08/31/17   Hoy Register, MD  meloxicam (MOBIC) 15 MG tablet Take 1 tablet (15 mg total) by  mouth daily. Patient not taking: Reported on 10/16/2019 08/07/19   Magnant, Joycie Peek, PA-C  methocarbamol (ROBAXIN) 500 MG tablet Take 500 mg by mouth every 8 (eight) hours as needed for muscle spasms. 05/13/16   [provider]  simvastatin (ZOCOR) 10 MG tablet Take 1 tablet (10 mg total) by mouth at bedtime. 10/16/19   Hoy Register, MD  tamsulosin (FLOMAX) 0.4 MG CAPS capsule Take 1 capsule (0.4 mg total) by mouth daily. 10/16/19   Hoy Register, MD  traZODone (DESYREL) 50 MG tablet Take 1 tablet (50 mg total) by mouth at bedtime as needed for sleep. 10/16/19   Hoy Register, MD    Family History Family History  Problem Relation Age of Onset   Diabetes Mother    Diabetes Brother    Heart attack Neg Hx    Hyperlipidemia Neg Hx    Hypertension Neg Hx    Sudden death Neg Hx     Social History Social History   Tobacco Use   Smoking status: Never   Smokeless tobacco: Never  Vaping Use   Vaping Use: Never used  Substance Use Topics   Alcohol use: No    Alcohol/week: 0.0 standard drinks   Drug use: No     Allergies   Patient has no known allergies.   Review of Systems Review of Systems  Genitourinary:  Negative for dysuria, frequency, hematuria and urgency.  Musculoskeletal:  Positive for back pain.  All other systems reviewed and are negative.   Physical Exam Triage Vital Signs ED Triage Vitals  Enc Vitals Group     BP 03/21/21 1619 111/79     Pulse Rate 03/21/21 1619 84     Resp 03/21/21 1619 17     Temp 03/21/21 1619 99.2 F (37.3 C)     Temp Source 03/21/21 1619 Oral     SpO2 03/21/21 1619 94 %     Weight --      Height --      Head Circumference --      Peak Flow --      Pain Score 03/21/21 1625 7     Pain Loc --      Pain Edu? --      Excl. in GC? --    No data found.  Updated Vital Signs BP 111/79 (BP Location: Left Arm)   Pulse 84   Temp 99.2 F (37.3 C) (Oral)   Resp 17   SpO2 94%   Visual Acuity Right Eye Distance:   Left  Eye Distance:   Bilateral Distance:    Right Eye Near:   Left Eye Near:    Bilateral Near:     Physical Exam Vitals and nursing note reviewed.  Constitutional:      General: He is not in acute distress.    Appearance: Normal appearance. He is not ill-appearing, toxic-appearing or diaphoretic.  HENT:     Head:  Normocephalic and atraumatic.  Eyes:     Conjunctiva/sclera: Conjunctivae normal.  Cardiovascular:     Rate and Rhythm: Normal rate.     Pulses: Normal pulses.  Pulmonary:     Effort: Pulmonary effort is normal.  Abdominal:     General: Abdomen is flat.     Tenderness: There is no right CVA tenderness or left CVA tenderness.  Musculoskeletal:     Cervical back: Normal and normal range of motion.     Thoracic back: Normal.     Lumbar back: Spasms and tenderness present. No bony tenderness. Positive left straight leg raise test.  Skin:    General: Skin is warm and dry.  Neurological:     General: No focal deficit present.     Mental Status: He is alert and oriented to person, place, and time.  Psychiatric:        Mood and Affect: Mood normal.     UC Treatments / Results  Labs (all labs ordered are listed, but only abnormal results are displayed) Labs Reviewed - No data to display  EKG   Radiology No results found.  Procedures Procedures (including critical care time)  Medications Ordered in UC Medications - No data to display  Initial Impression / Assessment and Plan / UC Course  I have reviewed the triage vital signs and the nursing notes.  Pertinent labs & imaging results that were available during my care of the patient were reviewed by me and considered in my medical decision making (see chart for details).    Assessment negative for red flags or concerns.  Likely sciatica of the left side.  We will treat with prednisone daily for the next 5 days.  Tylenol as needed for pain.  Discussed conservative symptom management including heat, ice, and OTC  medications.  Encourage fluids and rest.  Patient given gentle stretching and exercises to help with pain.  Follow-up with orthopedics if symptoms do not improve in the next few days. Final Clinical Impressions(s) / UC Diagnoses   Final diagnoses:  Sciatica of left side     Discharge Instructions      Take the prednisone daily for the next 5 days.  Take it in the morning.   You can take Tylenol as needed for pain.  You can use heat, ice, or alternate between heat and ice for comfort.  You can also use IcyHot, lidocaine patches, biofreeze, bengay, aspercreme, or voltaren gel as needed for pain.   Rest and drink plenty of fluids, especially water.    Follow up with orthopedics if symptoms do not improve in the next few days.      ED Prescriptions     Medication Sig Dispense Auth. Provider   predniSONE (DELTASONE) 20 MG tablet Take 2 tablets (40 mg total) by mouth daily for 5 days. 10 tablet Ivette Loyal, NP      PDMP not reviewed this encounter.   Ivette Loyal, NP 03/21/21 [phone]

## 2021-03-22 ENCOUNTER — Emergency Department (HOSPITAL_COMMUNITY)
Admission: EM | Admit: 2021-03-22 | Discharge: 2021-03-22 | Disposition: A | Discharge status: Home / Self Care | Payer: Medicaid Other | Attending: Emergency Medicine | Admitting: Emergency Medicine

## 2021-03-22 ENCOUNTER — Emergency Department (HOSPITAL_COMMUNITY): Payer: Medicaid Other

## 2021-03-22 ENCOUNTER — Encounter (HOSPITAL_COMMUNITY): Payer: Self-pay | Admitting: Emergency Medicine

## 2021-03-22 ENCOUNTER — Telehealth: Payer: Self-pay | Admitting: Orthopedic Surgery

## 2021-03-22 ENCOUNTER — Other Ambulatory Visit: Payer: Self-pay

## 2021-03-22 DIAGNOSIS — M5442 Lumbago with sciatica, left side: Secondary | ICD-10-CM | POA: Insufficient documentation

## 2021-03-22 DIAGNOSIS — M545 Low back pain, unspecified: Secondary | ICD-10-CM | POA: Diagnosis present

## 2021-03-22 DIAGNOSIS — Z7982 Long term (current) use of aspirin: Secondary | ICD-10-CM | POA: Insufficient documentation

## 2021-03-22 DIAGNOSIS — F0390 Unspecified dementia without behavioral disturbance: Secondary | ICD-10-CM | POA: Diagnosis not present

## 2021-03-22 DIAGNOSIS — R109 Unspecified abdominal pain: Secondary | ICD-10-CM | POA: Insufficient documentation

## 2021-03-22 DIAGNOSIS — Z79899 Other long term (current) drug therapy: Secondary | ICD-10-CM | POA: Insufficient documentation

## 2021-03-22 DIAGNOSIS — I10 Essential (primary) hypertension: Secondary | ICD-10-CM | POA: Diagnosis not present

## 2021-03-22 DIAGNOSIS — K219 Gastro-esophageal reflux disease without esophagitis: Secondary | ICD-10-CM | POA: Diagnosis not present

## 2021-03-22 DIAGNOSIS — I700 Atherosclerosis of aorta: Secondary | ICD-10-CM | POA: Diagnosis not present

## 2021-03-22 LAB — URINALYSIS, ROUTINE W REFLEX MICROSCOPIC
Bilirubin Urine: NEGATIVE
Glucose, UA: NEGATIVE mg/dL
Hgb urine dipstick: NEGATIVE
Ketones, ur: NEGATIVE mg/dL
Leukocytes,Ua: NEGATIVE
Nitrite: NEGATIVE
Protein, ur: NEGATIVE mg/dL
Specific Gravity, Urine: 1.02 (ref 1.005–1.030)
pH: 6 (ref 5.0–8.0)

## 2021-03-22 MED ORDER — HYDROCODONE-ACETAMINOPHEN 5-325 MG PO TABS: 1.0000 | ORAL_TABLET | Freq: Four times a day (QID) | ORAL | 0 refills | Status: DC | PRN

## 2021-03-22 MED ORDER — HYDROCODONE-ACETAMINOPHEN 5-325 MG PO TABS
1.0000 | ORAL_TABLET | Freq: Once | ORAL | Status: AC
  Administered 2021-03-22: 1 via ORAL
  Filled 2021-03-22: qty 1

## 2021-03-22 MED ORDER — IBUPROFEN 400 MG PO TABS
400.0000 mg | ORAL_TABLET | Freq: Once | ORAL | Status: AC
  Administered 2021-03-22: 400 mg via ORAL
  Filled 2021-03-22: qty 1

## 2021-03-22 NOTE — Discharge Instructions (Signed)
??? ?????? ????? ???????. ?? ???? ???? ??? ???? ????? ?? ????? ?? ????. ???? ?????? ??? ?????? ?????? ??????? ?? ??????? ??????? ??????? ?????? ??????? ?????. ???? ?? ????? ?? ???? ????? ??????? ?????? ?? ???? ?????. ?? ??? ????? ????????? ?? ?????.    Today's test have been reassuring.  There is no evidence for a kidney stone, or infection. Please obtain and take the medication precribed from Urgent Care, and today's new prescription. Follow up with your doctor in about one week to ensure that your condition has improved. Return here for concerning changes in your condition.

## 2021-03-22 NOTE — Telephone Encounter (Signed)
Patient request a sooner appointment states he's leaving to go out of town 03/28/21.

## 2021-03-22 NOTE — ED Triage Notes (Signed)
Pt back to the Ed with c/o back pain was at [facility] yesterday for same given script for prednisone and othro follow up, pt back for pain meds today , no trauma noted

## 2021-03-22 NOTE — ED Provider Notes (Signed)
[facility] EMERGENCY DEPARTMENT Provider Note   CSN: [id number] Arrival date & time: 03/22/21  1034     History No chief complaint on file.   Brandon Clark is a 80 y.o. male.  HPI Patient presents with back pain, pain is mostly midline, but slightly left with radiation inferiorly.  No precipitant.  No weakness, no fall, no abdominal pain, chest pain, no relief with OTC medication.  History is obtained with assistance of a translator as the patient speaks Arabic.    Past Medical History:  Diagnosis Date   Acid reflux    BPH (benign prostatic hyperplasia)    Hiatal hernia    Hyperlipidemia    Hypertension    Pre-diabetes    Sleep apnea    sometimes does not use d/t poor fitting of the mask   Ulcer     Patient Active Problem List   Diagnosis Date Noted   Laceration of flexor muscle, fascia and tendon of left thumb at forearm level, sequela 03/16/2021   S/P Nissen fundoplication (without gastrostomy tube) procedure 06/02/2017   Dementia (HCC) 05/12/2017   Prediabetes 01/30/2017   Hiatal hernia 01/27/2017   Acid reflux 02/12/2016   Sleep apnea 02/12/2016   Diastasis recti 03/02/2015   Umbilical hernia 09/16/2014   BPV (benign positional vertigo) 07/01/2014   HTN (hypertension) 07/01/2014   Panic attack    Encephalopathy, hypertensive    Ataxia 06/30/2014   Vertigo 06/30/2014   Dyspepsia 04/03/2014   Encounter for colorectal cancer screening 04/03/2014   Bilateral shoulder pain 09/17/2013   Bilateral knee pain 02/05/2013    Past Surgical History:  Procedure Laterality Date   ESOPHAGEAL MANOMETRY N/A 04/27/2016   Procedure: ESOPHAGEAL MANOMETRY (EM) with PH;  Surgeon: Midge Minium, MD;  Location: [facility];  Service: Endoscopy;  Laterality: N/A;   ESOPHAGEAL MANOMETRY N/A 04/19/2017   Procedure: ESOPHAGEAL MANOMETRY (EM);  Surgeon: Charlott Rakes, MD;  Location: [facility];  Service: Endoscopy;  Laterality: N/A;   FACIAL  LACERATIONS REPAIR     INSERTION OF MESH N/A 06/02/2017   Procedure: INSERTION OF MESH;  Surgeon: Axel Filler, MD;  Location: [facility];  Service: General;  Laterality: N/A;       Family History  Problem Relation Age of Onset   Diabetes Mother    Diabetes Brother    Heart attack Neg Hx    Hyperlipidemia Neg Hx    Hypertension Neg Hx    Sudden death Neg Hx     Social History   Tobacco Use   Smoking status: Never   Smokeless tobacco: Never  Vaping Use   Vaping Use: Never used  Substance Use Topics   Alcohol use: No    Alcohol/week: 0.0 standard drinks   Drug use: No    Home Medications Prior to Admission medications   Medication Sig Start Date End Date Taking? Authorizing Provider  acetaminophen (TYLENOL) 500 MG tablet Take 500-1,000 mg by mouth every 6 (six) hours as needed.    [provider]  aspirin EC 325 MG tablet Take 1 tablet (325 mg total) by mouth daily. Patient not taking: Reported on 10/16/2019 06/09/17   Everlene Farrier, PA-C  donepezil (ARICEPT) 10 MG tablet Take 1 tablet (10 mg total) by mouth at bedtime. Patient not taking: Reported on 10/16/2019 06/12/18   Hoy Register, MD  finasteride (PROSCAR) 5 MG tablet Take 1 tablet (5 mg total) by mouth daily. 10/16/19   Hoy Register, MD  hydrochlorothiazide (HYDRODIURIL) 12.5 MG tablet  Take 1 tablet (12.5 mg total) by mouth daily. Patient not taking: Reported on 10/16/2019 06/12/18   Hoy Register, MD  HYDROcodone-acetaminophen (NORCO/VICODIN) 5-325 MG tablet Take 1 tablet by mouth every 6 (six) hours as needed. 03/22/21   Gerhard Munch, MD  meclizine (ANTIVERT) 25 MG tablet Take 1 tablet (25 mg total) by mouth 2 (two) times daily as needed. Patient not taking: Reported on 10/16/2019 08/31/17   Hoy Register, MD  meloxicam (MOBIC) 15 MG tablet Take 1 tablet (15 mg total) by mouth daily. Patient not taking: Reported on 10/16/2019 08/07/19   Magnant, Joycie Peek, PA-C  methocarbamol (ROBAXIN) 500 MG tablet  Take 500 mg by mouth every 8 (eight) hours as needed for muscle spasms. 05/13/16   [provider]  predniSONE (DELTASONE) 20 MG tablet Take 2 tablets (40 mg total) by mouth daily for 5 days. 03/21/21 03/26/21  Ivette Loyal, NP  simvastatin (ZOCOR) 10 MG tablet Take 1 tablet (10 mg total) by mouth at bedtime. 10/16/19   Hoy Register, MD  tamsulosin (FLOMAX) 0.4 MG CAPS capsule Take 1 capsule (0.4 mg total) by mouth daily. 10/16/19   Hoy Register, MD  traZODone (DESYREL) 50 MG tablet Take 1 tablet (50 mg total) by mouth at bedtime as needed for sleep. 10/16/19   Hoy Register, MD    Allergies    Patient has no known allergies.  Review of Systems   Review of Systems  Constitutional:        Per HPI, otherwise negative  HENT:         Per HPI, otherwise negative  Respiratory:         Per HPI, otherwise negative  Cardiovascular:        Per HPI, otherwise negative  Gastrointestinal:  Negative for vomiting.  Endocrine:       Negative aside from HPI  Genitourinary:        Neg aside from HPI   Musculoskeletal:        Per HPI, otherwise negative  Skin: Negative.   Neurological:  Negative for syncope.   Physical Exam Updated Vital Signs There were no vitals taken for this visit.  Physical Exam Vitals and nursing note reviewed.  Constitutional:      General: He is not in acute distress.    Appearance: He is well-developed.  HENT:     Head: Normocephalic and atraumatic.  Eyes:     Conjunctiva/sclera: Conjunctivae normal.  Cardiovascular:     Rate and Rhythm: Normal rate and regular rhythm.  Pulmonary:     Effort: Pulmonary effort is normal. No respiratory distress.     Breath sounds: No stridor.  Abdominal:     General: There is no distension.  Musculoskeletal:     Comments: Patient flexes each hip independently, to command, with appropriate strength.  Some pain referred to the low back with left leg greater than right leg flexion.  Skin:    General: Skin is warm  and dry.  Neurological:     Mental Status: He is alert and oriented to person, place, and time.     Cranial Nerves: No cranial nerve deficit.  Psychiatric:        Mood and Affect: Mood normal.        Behavior: Behavior normal.    ED Results / Procedures / Treatments   Labs (all labs ordered are listed, but only abnormal results are displayed) Labs Reviewed  URINALYSIS, ROUTINE W REFLEX MICROSCOPIC      Radiology  CT Renal Stone Study  Result Date: 03/22/2021 CLINICAL DATA:  80 year old male with history of flank pain. Suspected kidney stone. EXAM: CT ABDOMEN AND PELVIS WITHOUT CONTRAST TECHNIQUE: Multidetector CT imaging of the abdomen and pelvis was performed following the standard protocol without IV contrast. COMPARISON:  CT the abdomen and pelvis 04/06/2017. FINDINGS: Lower chest: Unremarkable. Hepatobiliary: No definite suspicious cystic or solid hepatic lesions are confidently identified on today's noncontrast CT examination. Unenhanced appearance of the gallbladder is normal. Pancreas: No definite pancreatic mass or peripancreatic fluid collections or inflammatory changes are noted on today's noncontrast CT examination. Spleen: Unremarkable. Adrenals/Urinary Tract: There are no abnormal calcifications within the collecting system of either kidney, along the course of either ureter, or within the lumen of the urinary bladder. No hydroureteronephrosis or perinephric stranding to suggest urinary tract obstruction at this time. Small exophytic 1.7 cm low-attenuation lesion in the anterior aspect of the interpolar region of the left kidney, incompletely characterized on today's non-contrast CT examination, but similar to the prior study, likely a small cyst. The unenhanced appearance of the kidneys is otherwise unremarkable bilaterally. Unenhanced appearance of the urinary bladder is normal. Bilateral adrenal glands are normal in appearance. Stomach/Bowel: Unenhanced appearance of the stomach  is normal. No pathologic dilatation of small bowel or colon. Normal appendix. Vascular/Lymphatic: Aortic atherosclerosis. No lymphadenopathy noted in the abdomen or pelvis. Reproductive: Prostate gland and seminal vesicles are unremarkable in appearance. Other: No significant volume of ascites.  No pneumoperitoneum. Musculoskeletal: There are no aggressive appearing lytic or blastic lesions noted in the visualized portions of the skeleton. IMPRESSION: 1. No acute findings are noted in the abdomen or pelvis to account for the patient's symptoms. Specifically, no urinary tract calculi no findings of urinary tract obstruction are noted at this time. 2.  Aortic Atherosclerosis (ICD10-I70.0). Electronically Signed   By: Trudie Reed M.D.   On: 03/22/2021 13:23    Procedures Procedures   Medications Ordered in ED Medications  HYDROcodone-acetaminophen (NORCO/VICODIN) 5-325 MG per tablet 1 tablet (1 tablet Oral Given 03/22/21 1141)  ibuprofen (ADVIL) tablet 400 mg (400 mg Oral Given 03/22/21 1141)    ED Course  I have reviewed the triage vital signs and the nursing notes.  Pertinent labs & imaging results that were available during my care of the patient were reviewed by me and considered in my medical decision making (see chart for details).   3:02 PM On repeat exam the patient is in no distress, is awake, alert, sitting upright.  With the translator we discussed all findings and I reviewed his CT scan, no evidence for stone, no other acute abdominal processes.  Patient comfortable for discharge, requests analgesia, was encouraged to take this as well as the previously prescribed steroids from urgent care yesterday.  Absent red flags, distress, neurovascular compromise, patient discharged in stable condition. MDM Rules/Calculators/A&P MDM Number of Diagnoses or Management Options Acute midline low back pain with left-sided sciatica: new, needed workup   Amount and/or Complexity of Data  Reviewed Clinical lab tests: ordered and reviewed Tests in the radiology section of CPT: ordered and reviewed Tests in the medicine section of CPT: reviewed and ordered Independent visualization of images, tracings, or specimens: yes  Risk of Complications, Morbidity, and/or Mortality Presenting problems: high Diagnostic procedures: high Management options: high  Critical Care Total time providing critical care: < 30 minutes  Patient Progress Patient progress: improved   Final Clinical Impression(s) / ED Diagnoses Final diagnoses:  Acute midline low back pain with left-sided  sciatica    Rx / DC Orders ED Discharge Orders          Ordered    HYDROcodone-acetaminophen (NORCO/VICODIN) 5-325 MG tablet  Every 6 hours PRN        03/22/21 1459             Gerhard Munch, MD 03/22/21 1503

## 2021-03-22 NOTE — ED Notes (Signed)
Gave pt d/c instructions with interpreter. Pt acknowledged instructions. E signature pad not available. Pt wheeled out to ED entrance

## 2021-03-23 ENCOUNTER — Telehealth: Payer: Self-pay

## 2021-03-23 NOTE — Telephone Encounter (Signed)
Transition Care Management Unsuccessful Follow-up Telephone Call  Date of discharge and from where:  03/22/2021-[place]  Attempts:  1st Attempt  Reason for unsuccessful TCM follow-up call:  Missing or invalid number

## 2021-03-24 NOTE — Telephone Encounter (Signed)
ok 

## 2021-03-25 DIAGNOSIS — M5442 Lumbago with sciatica, left side: Secondary | ICD-10-CM | POA: Diagnosis not present

## 2021-03-25 NOTE — Telephone Encounter (Signed)
Transition Care Management Unsuccessful Follow-up Telephone Call  Date of discharge and from where:  03/22/2021-  Attempts:  2nd Attempt  Reason for unsuccessful TCM follow-up call:  Missing or invalid number

## 2021-03-25 NOTE — Telephone Encounter (Signed)
Tried calling. No answer. Dr August Saucer out of office for the rest of the week.

## 2021-03-25 NOTE — Telephone Encounter (Signed)
Transition Care Management Unsuccessful Follow-up Telephone Call  Date of discharge and from where:  03/22/2021 from [facility]  Attempts:  3rd Attempt  Reason for unsuccessful TCM follow-up call:  Unable to reach patient

## 2021-03-31 ENCOUNTER — Ambulatory Visit (INDEPENDENT_AMBULATORY_CARE_PROVIDER_SITE_OTHER): Payer: Medicaid Other | Admitting: Orthopedic Surgery

## 2021-03-31 ENCOUNTER — Other Ambulatory Visit: Payer: Self-pay

## 2021-03-31 ENCOUNTER — Ambulatory Visit: Payer: Self-pay

## 2021-03-31 DIAGNOSIS — M545 Low back pain, unspecified: Secondary | ICD-10-CM

## 2021-03-31 DIAGNOSIS — M19072 Primary osteoarthritis, left ankle and foot: Secondary | ICD-10-CM | POA: Diagnosis not present

## 2021-03-31 DIAGNOSIS — M79672 Pain in left foot: Secondary | ICD-10-CM

## 2021-04-03 ENCOUNTER — Encounter: Payer: Self-pay | Admitting: Orthopedic Surgery

## 2021-04-03 NOTE — Progress Notes (Signed)
Office Visit Note   Patient: Brandon Clark           Date of Birth: [birth year]           MRN: [id number] Visit Date: 03/31/2021 Requested by: No referring provider defined for this encounter. PCP: Patient, No Pcp Per (Inactive)  Subjective: Chief Complaint  Patient presents with   Left Foot - Pain   Left Shoulder - Pain    HPI: Brandon Clark is a 80 y.o. male who presents to the office complaining of radicular left leg pain with left foot pain.  Patient complains of dorsal left forefoot pain as well as left ankle pain.  He dropped a heavy object on his foot about 2 years ago and has had forefoot pain on and off since then.  He has had physical therapy which is helped in the past.  He also describes pain that radiates from his low back down to the knee with associated numbness tingling that travels in this distribution bypassing the into the foot.  He has had multiple ER visits recently for sciatica.  He endorses low back pain.  This pain is waking him up at night..                ROS: All systems reviewed are negative as they relate to the chief complaint within the history of present illness.  Patient denies fevers or chills.  Assessment & Plan: Visit Diagnoses:  1. Pain in left foot   2. Low back pain, unspecified back pain laterality, unspecified chronicity, unspecified whether sciatica present     Plan: Patient is a 80 year old male who presents complaining of left foot/ankle pain and low back pain with radicular pain.  He has history of left ankle pain which has been alleviated with ankle injection in the past.  He also has forefoot pain of the left foot that seems to correlate with an acute appearing second proximal phalanx base fracture.  This may be a chronic injury but he is tender over this location so plan to treated as an acute injury.  He denies any recent injury.  Recommended he ambulate with hard soled shoe and prescription provided for rigid  orthotic with molded arch support.  Left ankle injection administered today and patient tolerated the procedure well.  With his radicular pain that has prompted multiple emergency department visits as well as reproducible straight leg raise on exam today, plan to order MRI lumbar spine for further evaluation of any radicular sources of his pain.  Follow-up after MRI to review results.  Follow-Up Instructions: No follow-ups on file.   Orders:  Orders Placed This Encounter  Procedures   XR Foot Complete Left   XR Lumbar Spine 2-3 Views   MR Lumbar Spine w/o contrast   No orders of the defined types were placed in this encounter.     Procedures: Medium Joint Inj: L ankle on 03/31/2021 12:52 PM Indications: pain, joint swelling and diagnostic evaluation Details: 22 G 1.5 in needle, fluoroscopy-guided anteromedial approach Medications: 3 mL lidocaine 1 %; 1 mL bupivacaine 0.5 %; 40 mg methylPREDNISolone acetate 40 MG/ML (1/2 mL bupivacaine 0.5 %) Outcome: tolerated well, no immediate complications  Excellent flow was felt from the syringe into the joint. Procedure, treatment alternatives, risks and benefits explained, specific risks discussed. Consent was given by the patient. Immediately prior to procedure a time out was called to verify the correct patient, procedure, equipment, support staff and site/side marked as required. Patient  was prepped and draped in the usual sterile fashion.      Clinical Data: No additional findings.  Objective: Vital Signs: There were no vitals taken for this visit.  Physical Exam:  Constitutional: Patient appears well-developed HEENT:  Head: Normocephalic Eyes:EOM are normal Neck: Normal range of motion Cardiovascular: Normal rate Pulmonary/chest: Effort normal Neurologic: Patient is alert Skin: Skin is warm Psychiatric: Patient has normal mood and affect  Ortho Exam: Ortho exam demonstrates left foot with palpable DP pulse.  Intact  dorsiflexion, plantarflexion, inversion, eversion.  No tenderness of the medial or lateral malleoli.  He does have Tenderness over the joint line of the left ankle.  Tenderness also noted over the base of the second toe.  No Lisfranc joint tenderness.  No calf tenderness.  Negative Homans' sign.  Positive straight leg raise with pain elicited from the buttock radiating down to the knee.  Tenderness throughout the axial lumbar spine.  Worse pain with flexion of the spine and not with extension of the spine.  Moderately antalgic gait.  +5 motor strength of bilateral hip flexion, quadricep, hamstring, dorsiflexion, plantarflexion.  2+ patellar tendon reflexes of bilateral legs.  No evidence of clonus bilaterally.  Specialty Comments:  No specialty comments available.  Imaging: No results found.   PMFS History: Patient Active Problem List   Diagnosis Date Noted   Laceration of flexor muscle, fascia and tendon of left thumb at forearm level, sequela 03/16/2021   S/P Nissen fundoplication (without gastrostomy tube) procedure 06/02/2017   Dementia (HCC) 05/12/2017   Prediabetes 01/30/2017   Hiatal hernia 01/27/2017   Acid reflux 02/12/2016   Sleep apnea 02/12/2016   Diastasis recti 03/02/2015   Umbilical hernia 09/16/2014   BPV (benign positional vertigo) 07/01/2014   HTN (hypertension) 07/01/2014   Panic attack    Encephalopathy, hypertensive    Ataxia 06/30/2014   Vertigo 06/30/2014   Dyspepsia 04/03/2014   Encounter for colorectal cancer screening 04/03/2014   Bilateral shoulder pain 09/17/2013   Bilateral knee pain 02/05/2013   Past Medical History:  Diagnosis Date   Acid reflux    BPH (benign prostatic hyperplasia)    Hiatal hernia    Hyperlipidemia    Hypertension    Pre-diabetes    Sleep apnea    sometimes does not use d/t poor fitting of the mask   Ulcer     Family History  Problem Relation Age of Onset   Diabetes Mother    Diabetes Brother    Heart attack Neg Hx     Hyperlipidemia Neg Hx    Hypertension Neg Hx    Sudden death Neg Hx     Past Surgical History:  Procedure Laterality Date   ESOPHAGEAL MANOMETRY N/A 04/27/2016   Procedure: ESOPHAGEAL MANOMETRY (EM) with PH;  Surgeon: Midge Minium, MD;  Location: [facility];  Service: Endoscopy;  Laterality: N/A;   ESOPHAGEAL MANOMETRY N/A 04/19/2017   Procedure: ESOPHAGEAL MANOMETRY (EM);  Surgeon: Charlott Rakes, MD;  Location: [facility];  Service: Endoscopy;  Laterality: N/A;   FACIAL LACERATIONS REPAIR     INSERTION OF MESH N/A 06/02/2017   Procedure: INSERTION OF MESH;  Surgeon: Axel Filler, MD;  Location: [facility];  Service: General;  Laterality: N/A;   Social History   Occupational History   Occupation: Art gallery manager  Tobacco Use   Smoking status: Never   Smokeless tobacco: Never  Vaping Use   Vaping Use: Never used  Substance and Sexual Activity   Alcohol use: No  Alcohol/week: 0.0 standard drinks   Drug use: No   Sexual activity: Never

## 2021-04-05 MED ORDER — METHYLPREDNISOLONE ACETATE 40 MG/ML IJ SUSP
40.0000 mg | INTRAMUSCULAR | Status: AC | PRN
  Administered 2021-03-31: 40 mg via INTRA_ARTICULAR

## 2021-04-05 MED ORDER — BUPIVACAINE HCL 0.5 % IJ SOLN
1.0000 mL | INTRAMUSCULAR | Status: AC | PRN
  Administered 2021-03-31: 1 mL via INTRA_ARTICULAR

## 2021-04-05 MED ORDER — LIDOCAINE HCL 1 % IJ SOLN
3.0000 mL | INTRAMUSCULAR | Status: AC | PRN
  Administered 2021-03-31: 3 mL

## 2021-04-12 ENCOUNTER — Other Ambulatory Visit: Payer: Self-pay

## 2021-04-12 ENCOUNTER — Ambulatory Visit (HOSPITAL_COMMUNITY)
Admission: EM | Admit: 2021-04-12 | Discharge: 2021-04-12 | Disposition: A | Discharge status: Home / Self Care | Payer: Medicaid Other | Attending: Family Medicine | Admitting: Family Medicine

## 2021-04-12 ENCOUNTER — Ambulatory Visit (HOSPITAL_COMMUNITY): Payer: Medicaid Other

## 2021-04-12 ENCOUNTER — Encounter (HOSPITAL_COMMUNITY): Payer: Self-pay | Admitting: Emergency Medicine

## 2021-04-12 DIAGNOSIS — B349 Viral infection, unspecified: Secondary | ICD-10-CM | POA: Diagnosis not present

## 2021-04-12 DIAGNOSIS — R110 Nausea: Secondary | ICD-10-CM

## 2021-04-12 MED ORDER — ONDANSETRON 4 MG PO TBDP: 4.0000 mg | ORAL_TABLET | Freq: Three times a day (TID) | ORAL | 0 refills | Status: DC | PRN

## 2021-04-12 NOTE — ED Triage Notes (Signed)
Used interpretor Pt c/o headache, abd pain, sweating, body aches, nasal congestion and SOB that started yesterday. Pt had unsteady gait when ambulating from lobby to treatment room. Pt reports that started today. Denies any falls. States that he is dizzy with movements

## 2021-04-14 NOTE — ED Provider Notes (Signed)
[facility]   [id number] 04/12/21 Arrival Time: 1047  ASSESSMENT & PLAN:  1. Viral illness   2. Nausea without vomiting    No signs of dehydration. COVID testing declined. Discussed typical duration of viral illnesses. Benign exam.  Meds ordered this encounter  Medications   ondansetron (ZOFRAN-ODT) 4 MG disintegrating tablet    Sig: Take 1 tablet (4 mg total) by mouth every 8 (eight) hours as needed for nausea or vomiting.    Dispense:  15 tablet    Refill:  0     Follow-up Information     [facility].   Specialty: Urgent Care Why: As needed. Contact information: [address] [place] [address] [phone]                Reviewed expectations re: course of current medical issues. Questions answered. Outlined signs and symptoms indicating need for more acute intervention. Understanding verbalized. After Visit Summary given.   SUBJECTIVE: History from: patient. Interpreter used. Brandon Clark is a 80 y.o. male who reports: feeling cold and chilled; nausea without emesis; abrupt onset yesterday. Unsure if fever present. Fatigued. Denies: cough and difficulty breathing. No abd pain. Normal PO intake without n/v/d.   OBJECTIVE:  Vitals:   04/12/21 1242  BP: 115/80  Pulse: 83  Resp: 19  Temp: 98.3 F (36.8 C)  TempSrc: Oral  SpO2: 93%    General appearance: alert; no distress Eyes: PERRLA; EOMI; conjunctiva normal HENT: [place]; AT; with mild nasal congestion Neck: supple  Lungs: speaks full sentences without difficulty; unlabored; clear; dry cough Extremities: no edema Skin: warm and dry Neurologic: normal gait Psychological: alert and cooperative; normal mood and affect   No Known Allergies  Past Medical History:  Diagnosis Date   Acid reflux    BPH (benign prostatic hyperplasia)    Hiatal hernia    Hyperlipidemia    Hypertension    Pre-diabetes    Sleep apnea    sometimes  does not use d/t poor fitting of the mask   Ulcer    Social History   Socioeconomic History   Marital status: Married    Spouse name: Not on file   Number of children: 7   Years of education: Not on file   Highest education level: Not on file  Occupational History   Occupation: Art gallery manager  Tobacco Use   Smoking status: Never   Smokeless tobacco: Never  Vaping Use   Vaping Use: Never used  Substance and Sexual Activity   Alcohol use: No    Alcohol/week: 0.0 standard drinks   Drug use: No   Sexual activity: Never  Other Topics Concern   Not on file  Social History Narrative   Not on file   Social Determinants of Health   Financial Resource Strain: Not on file  Food Insecurity: Not on file  Transportation Needs: Not on file  Physical Activity: Not on file  Stress: Not on file  Social Connections: Not on file  Intimate Partner Violence: Not on file   Family History  Problem Relation Age of Onset   Diabetes Mother    Diabetes Brother    Heart attack Neg Hx    Hyperlipidemia Neg Hx    Hypertension Neg Hx    Sudden death Neg Hx    Past Surgical History:  Procedure Laterality Date   ESOPHAGEAL MANOMETRY N/A 04/27/2016   Procedure: ESOPHAGEAL MANOMETRY (EM) with PH;  Surgeon: Midge Minium, MD;  Location: [facility] END[facility];  Service: Endoscopy;  Laterality: N/A;   ESOPHAGEAL MANOMETRY N/A 04/19/2017   Procedure: ESOPHAGEAL MANOMETRY (EM);  Surgeon: Charlott Rakes, MD;  Location: [facility];  Service: Endoscopy;  Laterality: N/A;   FACIAL LACERATIONS REPAIR     INSERTION OF MESH N/A 06/02/2017   Procedure: INSERTION OF MESH;  Surgeon: Axel Filler, MD;  Location: [facility];  Service: General;  Laterality: Vertis Kelch, MD 04/14/21 0930

## 2021-04-16 ENCOUNTER — Ambulatory Visit (HOSPITAL_COMMUNITY)
Admission: EM | Admit: 2021-04-16 | Discharge: 2021-04-16 | Disposition: A | Discharge status: Home / Self Care | Payer: Medicaid Other | Attending: Medical Oncology | Admitting: Medical Oncology

## 2021-04-16 ENCOUNTER — Encounter (HOSPITAL_COMMUNITY): Payer: Self-pay

## 2021-04-16 DIAGNOSIS — J069 Acute upper respiratory infection, unspecified: Secondary | ICD-10-CM | POA: Diagnosis not present

## 2021-04-16 DIAGNOSIS — Z789 Other specified health status: Secondary | ICD-10-CM | POA: Diagnosis not present

## 2021-04-16 MED ORDER — ALBUTEROL SULFATE HFA 108 (90 BASE) MCG/ACT IN AERS: 1.0000 | INHALATION_SPRAY | Freq: Four times a day (QID) | RESPIRATORY_TRACT | 0 refills | Status: DC | PRN

## 2021-04-16 MED ORDER — BENZONATATE 100 MG PO CAPS: 100.0000 mg | ORAL_CAPSULE | Freq: Three times a day (TID) | ORAL | 0 refills | Status: DC

## 2021-04-16 MED ORDER — FLUTICASONE PROPIONATE 50 MCG/ACT NA SUSP: 2.0000 | Freq: Every day | NASAL | 0 refills | Status: DC

## 2021-04-16 NOTE — Discharge Instructions (Addendum)
It appears that you have a viral illness also known as a common cold I am sending in Tessalon for cough, Flonase for nasal congestion, and albuterol inhaler for cough I want you to rest and hydrate.  If you are not feeling better over the next 5 to 7 days or should any of your symptoms worsen I want you to follow-up in office.  If you continue to have the black specks in your mucus or should you have any blood in your mucus I want you to follow-up with our office as soon as possible.

## 2021-04-16 NOTE — ED Triage Notes (Signed)
Per interpretor. Pt was seen here on Monday and the Zofran caused his eyes to swell and constipation. States his abdominal cramping is better. Pt states still having runny nose, chills, and cough.

## 2021-04-16 NOTE — ED Provider Notes (Addendum)
[facility]    CSN: [id number] Arrival date & time: 04/16/21  1507      History   Chief Complaint Chief Complaint  Patient presents with   Nasal Congestion    HPI Brandon Clark is a 80 y.o. male. Ghada Arabic interpreter helped assist with our visit today  HPI  Nasal Congestion: Pt was seen in office on 04/12/2021 for symptoms of nausea and flulike symptoms.  He was given Zofran which caused some abdominal cramping and his eyes to swell.  This is fully resolved.  He states that he is returning today for recheck as he still has a semi productive cough which is normally yellow with occasional black speck and no hemoptysis, runny nose and chills. Nausea and vomiting have resolved. He has had no recent fevers.  His O2 saturation has improved from 93% to 95% since his last visit. He asks for medication to help with his cold symptoms. NO TB contacts.   Past Medical History:  Diagnosis Date   Acid reflux    BPH (benign prostatic hyperplasia)    Hiatal hernia    Hyperlipidemia    Hypertension    Pre-diabetes    Sleep apnea    sometimes does not use d/t poor fitting of the mask   Ulcer     Patient Active Problem List   Diagnosis Date Noted   Laceration of flexor muscle, fascia and tendon of left thumb at forearm level, sequela 03/16/2021   S/P Nissen fundoplication (without gastrostomy tube) procedure 06/02/2017   Dementia (HCC) 05/12/2017   Prediabetes 01/30/2017   Hiatal hernia 01/27/2017   Acid reflux 02/12/2016   Sleep apnea 02/12/2016   Diastasis recti 03/02/2015   Umbilical hernia 09/16/2014   BPV (benign positional vertigo) 07/01/2014   HTN (hypertension) 07/01/2014   Panic attack    Encephalopathy, hypertensive    Ataxia 06/30/2014   Vertigo 06/30/2014   Dyspepsia 04/03/2014   Encounter for colorectal cancer screening 04/03/2014   Bilateral shoulder pain 09/17/2013   Bilateral knee pain 02/05/2013    Past Surgical History:  Procedure  Laterality Date   ESOPHAGEAL MANOMETRY N/A 04/27/2016   Procedure: ESOPHAGEAL MANOMETRY (EM) with PH;  Surgeon: Midge Minium, MD;  Location: [facility];  Service: Endoscopy;  Laterality: N/A;   ESOPHAGEAL MANOMETRY N/A 04/19/2017   Procedure: ESOPHAGEAL MANOMETRY (EM);  Surgeon: Charlott Rakes, MD;  Location: [facility];  Service: Endoscopy;  Laterality: N/A;   FACIAL LACERATIONS REPAIR     INSERTION OF MESH N/A 06/02/2017   Procedure: INSERTION OF MESH;  Surgeon: Axel Filler, MD;  Location: [facility];  Service: General;  Laterality: N/A;       Home Medications    Prior to Admission medications   Medication Sig Start Date End Date Taking? Authorizing Provider  acetaminophen (TYLENOL) 500 MG tablet Take 500-1,000 mg by mouth every 6 (six) hours as needed.    [provider]  aspirin EC 325 MG tablet Take 1 tablet (325 mg total) by mouth daily. Patient not taking: Reported on 10/16/2019 06/09/17   Everlene Farrier, PA-C  donepezil (ARICEPT) 10 MG tablet Take 1 tablet (10 mg total) by mouth at bedtime. Patient not taking: Reported on 10/16/2019 06/12/18   Hoy Register, MD  finasteride (PROSCAR) 5 MG tablet Take 1 tablet (5 mg total) by mouth daily. 10/16/19   Hoy Register, MD  hydrochlorothiazide (HYDRODIURIL) 12.5 MG tablet Take 1 tablet (12.5 mg total) by mouth daily. Patient not taking: Reported on 10/16/2019 06/12/18  Hoy Register, MD  HYDROcodone-acetaminophen (NORCO/VICODIN) 5-325 MG tablet Take 1 tablet by mouth every 6 (six) hours as needed. 03/22/21   Gerhard Munch, MD  meclizine (ANTIVERT) 25 MG tablet Take 1 tablet (25 mg total) by mouth 2 (two) times daily as needed. Patient not taking: Reported on 10/16/2019 08/31/17   Hoy Register, MD  meloxicam (MOBIC) 15 MG tablet Take 1 tablet (15 mg total) by mouth daily. Patient not taking: Reported on 10/16/2019 08/07/19   Magnant, Joycie Peek, PA-C  methocarbamol (ROBAXIN) 500 MG tablet Take 500 mg by mouth every 8  (eight) hours as needed for muscle spasms. 05/13/16   [provider]  ondansetron (ZOFRAN-ODT) 4 MG disintegrating tablet Take 1 tablet (4 mg total) by mouth every 8 (eight) hours as needed for nausea or vomiting. 04/12/21   Mardella Layman, MD  simvastatin (ZOCOR) 10 MG tablet Take 1 tablet (10 mg total) by mouth at bedtime. 10/16/19   Hoy Register, MD  tamsulosin (FLOMAX) 0.4 MG CAPS capsule Take 1 capsule (0.4 mg total) by mouth daily. 10/16/19   Hoy Register, MD  traZODone (DESYREL) 50 MG tablet Take 1 tablet (50 mg total) by mouth at bedtime as needed for sleep. 10/16/19   Hoy Register, MD    Family History Family History  Problem Relation Age of Onset   Diabetes Mother    Diabetes Brother    Heart attack Neg Hx    Hyperlipidemia Neg Hx    Hypertension Neg Hx    Sudden death Neg Hx     Social History Social History   Tobacco Use   Smoking status: Never   Smokeless tobacco: Never  Vaping Use   Vaping Use: Never used  Substance Use Topics   Alcohol use: No    Alcohol/week: 0.0 standard drinks   Drug use: No     Allergies   Patient has no known allergies.   Review of Systems Review of Systems  As stated above in HPI Physical Exam Triage Vital Signs ED Triage Vitals [04/16/21 1616]  Enc Vitals Group     BP 139/87     Pulse Rate 72     Resp 18     Temp 98.2 F (36.8 C)     Temp Source Oral     SpO2 95 %     Weight      Height      Head Circumference      Peak Flow      Pain Score      Pain Loc      Pain Edu?      Excl. in GC?    No data found.  Updated Vital Signs BP 139/87 (BP Location: Left Arm)   Pulse 72   Temp 98.2 F (36.8 C) (Oral)   Resp 18   SpO2 95%   Physical Exam Vitals and nursing note reviewed.  Constitutional:      General: He is not in acute distress.    Appearance: Normal appearance. He is not ill-appearing, toxic-appearing or diaphoretic.  HENT:     Head: Normocephalic and atraumatic.     Right Ear: Tympanic  membrane normal.     Left Ear: Tympanic membrane normal.     Nose: Rhinorrhea (mild clear) present.  Eyes:     Extraocular Movements: Extraocular movements intact.     Pupils: Pupils are equal, round, and reactive to light.  Cardiovascular:     Rate and Rhythm: Normal rate and regular rhythm.  Heart sounds: Normal heart sounds.  Pulmonary:     Effort: Pulmonary effort is normal.     Breath sounds: Normal breath sounds.  Musculoskeletal:     Cervical back: Neck supple.  Lymphadenopathy:     Cervical: No cervical adenopathy.  Skin:    General: Skin is warm.  Neurological:     Mental Status: He is alert and oriented to person, place, and time.     UC Treatments / Results  Labs (all labs ordered are listed, but only abnormal results are displayed) Labs Reviewed - No data to display  EKG   Radiology No results found.  Procedures Procedures (including critical care time)  Medications Ordered in UC Medications - No data to display  Initial Impression / Assessment and Plan / UC Course  I have reviewed the triage vital signs and the nursing notes.  Pertinent labs & imaging results that were available during my care of the patient were reviewed by me and considered in my medical decision making (see chart for details).     New. Improving but currently does not have any medication for symptoms. Treating with tessalon, flonase and albuterol. Can use mucinex if needed at home.  We discussed red flag signs and symptoms.  We discussed that if his cough fails to improve or should he have any hemoptysis he needs to return for follow-up or seek emergency medical attention.   Final Clinical Impressions(s) / UC Diagnoses   Final diagnoses:  None   Discharge Instructions   None    ED Prescriptions   None    PDMP not reviewed this encounter.   Rushie Chestnut, PA-C 04/16/21 1711    Rushie Chestnut, PA-C 04/16/21 1713

## 2021-04-19 DIAGNOSIS — F419 Anxiety disorder, unspecified: Secondary | ICD-10-CM | POA: Insufficient documentation

## 2021-04-19 DIAGNOSIS — R052 Subacute cough: Secondary | ICD-10-CM | POA: Diagnosis not present

## 2021-04-19 DIAGNOSIS — F0390 Unspecified dementia without behavioral disturbance: Secondary | ICD-10-CM | POA: Diagnosis not present

## 2021-04-19 DIAGNOSIS — Z111 Encounter for screening for respiratory tuberculosis: Secondary | ICD-10-CM | POA: Diagnosis not present

## 2021-04-19 DIAGNOSIS — G4700 Insomnia, unspecified: Secondary | ICD-10-CM | POA: Diagnosis not present

## 2021-04-21 DIAGNOSIS — R7611 Nonspecific reaction to tuberculin skin test without active tuberculosis: Secondary | ICD-10-CM | POA: Diagnosis not present

## 2021-04-21 DIAGNOSIS — Z111 Encounter for screening for respiratory tuberculosis: Secondary | ICD-10-CM | POA: Diagnosis not present

## 2021-04-26 ENCOUNTER — Ambulatory Visit: Payer: Self-pay

## 2021-04-26 ENCOUNTER — Ambulatory Visit: Payer: Medicaid Other | Admitting: Surgical

## 2021-04-26 ENCOUNTER — Other Ambulatory Visit: Payer: Self-pay

## 2021-04-26 DIAGNOSIS — M79672 Pain in left foot: Secondary | ICD-10-CM

## 2021-04-30 DIAGNOSIS — R7611 Nonspecific reaction to tuberculin skin test without active tuberculosis: Secondary | ICD-10-CM | POA: Diagnosis not present

## 2021-04-30 DIAGNOSIS — R059 Cough, unspecified: Secondary | ICD-10-CM | POA: Diagnosis not present

## 2021-05-06 DIAGNOSIS — Z111 Encounter for screening for respiratory tuberculosis: Secondary | ICD-10-CM | POA: Diagnosis not present

## 2021-05-06 DIAGNOSIS — R918 Other nonspecific abnormal finding of lung field: Secondary | ICD-10-CM | POA: Diagnosis not present

## 2021-05-09 ENCOUNTER — Encounter: Payer: Self-pay | Admitting: Orthopedic Surgery

## 2021-05-09 NOTE — Progress Notes (Signed)
Office Visit Note   Patient: Brandon Clark           Date of Birth: [birth year]           MRN: [id number] Visit Date: 04/26/2021 Requested by: No referring provider defined for this encounter. PCP: Patient, No Pcp Per (Inactive)  Subjective: Chief Complaint  Patient presents with   Lower Back - Pain    HPI: [place] MORTIMER BAIR is a 80 y.o. male who presents to the office complaining of continued back pain with radicular pain.  He has not heard anything about his MRI scan being scheduled yet.  He does report significant relief of his prior left foot and ankle pain.  Left ankle injection helped a lot as it has in the past..                ROS: All systems reviewed are negative as they relate to the chief complaint within the history of present illness.  Patient denies fevers or chills.  Assessment & Plan: Visit Diagnoses:  1. Pain in left foot     Plan: Patient is a 80 year old male who returns for reevaluation following left ankle injection.  Tolerated injection well and injections done very good for him in the last several weeks.  He was scheduled to come back to review MRI of the lumbar spine but he has not actually had this yet and has not actually heard anything about scheduling it.  This MRI was coordinated while he was in the office today with interpreter present as this will make it easy is for the patient.  Date was set up and patient understands the plan.  Follow-up after MRI to review results.  No charge for visit today due to miscommunication with patient.  Follow-Up Instructions: No follow-ups on file.   Orders:  Orders Placed This Encounter  Procedures   XR Foot Complete Left   No orders of the defined types were placed in this encounter.     Procedures: No procedures performed   Clinical Data: No additional findings.  Objective: Vital Signs: There were no vitals taken for this visit.  Physical Exam:  Constitutional: Patient appears  well-developed HEENT:  Head: Normocephalic Eyes:EOM are normal Neck: Normal range of motion Cardiovascular: Normal rate Pulmonary/chest: Effort normal Neurologic: Patient is alert Skin: Skin is warm Psychiatric: Patient has normal mood and affect  Ortho Exam: No significant tenderness throughout the left forefoot.  No tenderness along the left ankle joint line.  Continued positive straight leg raise.  5/5 motor strength of bilateral hip flexion, quadricep, hamstring, dorsiflexion, plantarflexion.  Specialty Comments:  No specialty comments available.  Imaging: No results found.   PMFS History: Patient Active Problem List   Diagnosis Date Noted   Laceration of flexor muscle, fascia and tendon of left thumb at forearm level, sequela 03/16/2021   S/P Nissen fundoplication (without gastrostomy tube) procedure 06/02/2017   Dementia (HCC) 05/12/2017   Prediabetes 01/30/2017   Hiatal hernia 01/27/2017   Acid reflux 02/12/2016   Sleep apnea 02/12/2016   Diastasis recti 03/02/2015   Umbilical hernia 09/16/2014   BPV (benign positional vertigo) 07/01/2014   HTN (hypertension) 07/01/2014   Panic attack    Encephalopathy, hypertensive    Ataxia 06/30/2014   Vertigo 06/30/2014   Dyspepsia 04/03/2014   Encounter for colorectal cancer screening 04/03/2014   Bilateral shoulder pain 09/17/2013   Bilateral knee pain 02/05/2013   Past Medical History:  Diagnosis Date   Acid reflux  BPH (benign prostatic hyperplasia)    Hiatal hernia    Hyperlipidemia    Hypertension    Pre-diabetes    Sleep apnea    sometimes does not use d/t poor fitting of the mask   Ulcer     Family History  Problem Relation Age of Onset   Diabetes Mother    Diabetes Brother    Heart attack Neg Hx    Hyperlipidemia Neg Hx    Hypertension Neg Hx    Sudden death Neg Hx     Past Surgical History:  Procedure Laterality Date   ESOPHAGEAL MANOMETRY N/A 04/27/2016   Procedure: ESOPHAGEAL MANOMETRY (EM)  with PH;  Surgeon: Midge Minium, MD;  Location: [facility];  Service: Endoscopy;  Laterality: N/A;   ESOPHAGEAL MANOMETRY N/A 04/19/2017   Procedure: ESOPHAGEAL MANOMETRY (EM);  Surgeon: Charlott Rakes, MD;  Location: [facility];  Service: Endoscopy;  Laterality: N/A;   FACIAL LACERATIONS REPAIR     INSERTION OF MESH N/A 06/02/2017   Procedure: INSERTION OF MESH;  Surgeon: Axel Filler, MD;  Location: [facility];  Service: General;  Laterality: N/A;   Social History   Occupational History   Occupation: Art gallery manager  Tobacco Use   Smoking status: Never   Smokeless tobacco: Never  Vaping Use   Vaping Use: Never used  Substance and Sexual Activity   Alcohol use: No    Alcohol/week: 0.0 standard drinks   Drug use: No   Sexual activity: Never

## 2021-05-10 ENCOUNTER — Other Ambulatory Visit: Payer: Self-pay

## 2021-05-10 ENCOUNTER — Ambulatory Visit
Admission: RE | Admit: 2021-05-10 | Discharge: 2021-05-10 | Disposition: A | Discharge status: Home / Self Care | Payer: Medicaid Other | Source: Ambulatory Visit | Attending: Surgical | Admitting: Surgical

## 2021-05-10 DIAGNOSIS — M545 Low back pain, unspecified: Secondary | ICD-10-CM

## 2021-05-10 DIAGNOSIS — Z712 Person consulting for explanation of examination or test findings: Secondary | ICD-10-CM | POA: Diagnosis not present

## 2021-05-12 ENCOUNTER — Ambulatory Visit (INDEPENDENT_AMBULATORY_CARE_PROVIDER_SITE_OTHER): Payer: Medicaid Other | Admitting: Surgical

## 2021-05-12 ENCOUNTER — Other Ambulatory Visit: Payer: Self-pay

## 2021-05-12 DIAGNOSIS — M545 Low back pain, unspecified: Secondary | ICD-10-CM | POA: Diagnosis not present

## 2021-05-12 DIAGNOSIS — M170 Bilateral primary osteoarthritis of knee: Secondary | ICD-10-CM | POA: Diagnosis not present

## 2021-05-15 ENCOUNTER — Encounter: Payer: Self-pay | Admitting: Orthopedic Surgery

## 2021-05-15 MED ORDER — BUPIVACAINE HCL 0.25 % IJ SOLN
4.0000 mL | INTRAMUSCULAR | Status: AC | PRN
  Administered 2021-05-12: 4 mL via INTRA_ARTICULAR

## 2021-05-15 MED ORDER — LIDOCAINE HCL 1 % IJ SOLN
5.0000 mL | INTRAMUSCULAR | Status: AC | PRN
  Administered 2021-05-12: 5 mL

## 2021-05-15 MED ORDER — METHYLPREDNISOLONE ACETATE 40 MG/ML IJ SUSP
40.0000 mg | INTRAMUSCULAR | Status: AC | PRN
  Administered 2021-05-12: 40 mg via INTRA_ARTICULAR

## 2021-05-15 MED ORDER — METHYLPREDNISOLONE ACETATE 40 MG/ML IJ SUSP
40.0000 mg | INTRAMUSCULAR | Status: AC | PRN
Start: 2021-05-12 — End: 2021-05-12
  Administered 2021-05-12: 40 mg via INTRA_ARTICULAR

## 2021-05-15 NOTE — Progress Notes (Signed)
Office Visit Note   Patient: Brandon Clark           Date of Birth: 1941/06/22           MRN: [id number] Visit Date: 05/12/2021 Requested by: No referring provider defined for this encounter. PCP: Patient, No Pcp Per (Inactive)  Subjective: Chief Complaint  Patient presents with   Other     Scan review    HPI: Brandon Clark is a 80 y.o. male who presents to the office for MRI review. Patient denies any changes in symptoms.  Continues to complain mainly of left-sided low back pain with radicular pain down left leg into the left buttock.  No numbness or tingling.  MRI results revealed: MR Lumbar Spine w/o contrast  Result Date: 05/11/2021 CLINICAL DATA:  Low back pain, unspecified back pain laterality, unspecified chronicity, unspecified whether sciatica present; MR lumbar spine eval for source of left radicular leg and foot pain. EXAM: MRI LUMBAR SPINE WITHOUT CONTRAST TECHNIQUE: Multiplanar, multisequence MR imaging of the lumbar spine was performed. No intravenous contrast was administered. COMPARISON:  Radiographs March 31, 2021. FINDINGS: Segmentation:  Standard. Alignment:  Physiologic. Vertebrae: No fracture, evidence of discitis, or bone lesion. Endplate degenerative changes at L2-3 and L4-5. Conus medullaris and cauda equina: Conus extends to the L1-2 level. Conus and cauda equina appear normal. Paraspinal and other soft tissues: Negative. Disc levels: T12-L1: No spinal canal or neural foraminal stenosis. L1-2: Disc bulge and mild facet degenerative changes resulting in mild bilateral neural foraminal narrowing. No significant spinal canal stenosis. L2-3: Disc bulge and mild facet degenerative changes resulting in mild bilateral neural foraminal narrowing. No significant spinal canal stenosis. L3-4 disc bulge and mild facet degenerative changes resulting mild bilateral neural foraminal narrowing. No significant spinal canal stenosis. L4-5: Disc bulge with  superimposed right central to foraminal disc protrusion and mild facet degenerative change resulting in mild narrowing of the right subarticular zone, moderate right and mild left neural foraminal narrowing. No significant spinal canal stenosis. L5-S1: Disc bulge, prominent hypertrophic right facet degenerative changes and moderate left facet degenerative changes. Findings result in severe narrowing of the right subarticular zone and right neural foraminal narrowing. Mild left neural foraminal narrowing. No significant spinal canal stenosis. IMPRESSION: 1. Degenerative changes at L5-S1, more pronounced at the level of the right facet joint where there are prominent hypertrophic changes. Findings result in severe narrowing of the right subarticular zone and severe right neural foraminal narrowing at this level. 2. Moderate right and mild left neural foraminal narrowing at L4-5. 3. Mild bilateral neural foraminal narrowing at L1-2, L2-3 and L3-4. 4. No high-grade spinal canal stenosis. Electronically Signed   By: Baldemar Lenis M.D.   On: 05/11/2021 17:45                 ROS: All systems reviewed are negative as they relate to the chief complaint within the history of present illness.  Patient denies fevers or chills.  Assessment & Plan: Visit Diagnoses:  1. Low back pain, unspecified back pain laterality, unspecified chronicity, unspecified whether sciatica present     Plan: Brandon Clark is a 80 y.o. male who presents to the office for review of MRI lumbar spine.  Most of the degenerative changes on the scan is worse on the right side but he has no right-sided symptoms, only left-sided symptoms.  Plan to refer to Dr. Alvester Morin for L-spine ESI.  Also today he had bilateral knee aspiration and  injection.  Also plan to refer him to physical therapy upstairs for one-time visit Erlinda Hong is on home exercise program for L-spine exercises as he has never done exercise before.  Follow-Up  Instructions: No follow-ups on file.   Orders:  Orders Placed This Encounter  Procedures   Ambulatory referral to Physical Medicine Rehab   Ambulatory referral to Physical Therapy   No orders of the defined types were placed in this encounter.     Procedures: Large Joint Inj: bilateral knee on 05/12/2021 7:01 PM Indications: diagnostic evaluation, joint swelling and pain Details: 18 G 1.5 in needle, superolateral approach  Arthrogram: No  Medications (Right): 5 mL lidocaine 1 %; 4 mL bupivacaine 0.25 %; 40 mg methylPREDNISolone acetate 40 MG/ML Aspirate (Right): 5 mL Medications (Left): 5 mL lidocaine 1 %; 4 mL bupivacaine 0.25 %; 40 mg methylPREDNISolone acetate 40 MG/ML Aspirate (Left): 15 mL Outcome: tolerated well, no immediate complications Procedure, treatment alternatives, risks and benefits explained, specific risks discussed. Consent was given by the patient. Immediately prior to procedure a time out was called to verify the correct patient, procedure, equipment, support staff and site/side marked as required. Patient was prepped and draped in the usual sterile fashion.      Clinical Data: No additional findings.  Objective: Vital Signs: There were no vitals taken for this visit.  Physical Exam:  Constitutional: Patient appears well-developed HEENT:  Head: Normocephalic Eyes:EOM are normal Neck: Normal range of motion Cardiovascular: Normal rate Pulmonary/chest: Effort normal Neurologic: Patient is alert Skin: Skin is warm Psychiatric: Patient has normal mood and affect  Ortho Exam: Ortho exam demonstrates bilateral knees with effusions.  Tenderness over the medial lateral joint lines of both knees.  Positive straight leg raise on left leg.  Tenderness throughout the axial lumbar spine.  Specialty Comments:  No specialty comments available.  Imaging: No results found.   PMFS History: Patient Active Problem List   Diagnosis Date Noted   Laceration of  flexor muscle, fascia and tendon of left thumb at forearm level, sequela 03/16/2021   S/P Nissen fundoplication (without gastrostomy tube) procedure 06/02/2017   Dementia ([place]) 05/12/2017   Prediabetes 01/30/2017   Hiatal hernia 01/27/2017   Acid reflux 02/12/2016   Sleep apnea 02/12/2016   Diastasis recti [id number]   Umbilical hernia [id number]   BPV (benign positional vertigo) 07/01/2014   HTN (hypertension) 07/01/2014   Panic attack    Encephalopathy, hypertensive    Ataxia 06/30/2014   Vertigo 06/30/2014   Dyspepsia 04/03/2014   Encounter for colorectal cancer screening 04/03/2014   Bilateral shoulder pain 09/17/2013   Bilateral knee pain 02/05/2013   Past Medical History:  Diagnosis Date   Acid reflux    BPH (benign prostatic hyperplasia)    Hiatal hernia    Hyperlipidemia    Hypertension    Pre-diabetes    Sleep apnea    sometimes does not use d/t poor fitting of the mask   Ulcer     Family History  Problem Relation Age of Onset   Diabetes Mother    Diabetes Brother    Heart attack Neg Hx    Hyperlipidemia Neg Hx    Hypertension Neg Hx    Sudden death Neg Hx     Past Surgical History:  Procedure Laterality Date   ESOPHAGEAL MANOMETRY N/A 04/27/2016   Procedure: ESOPHAGEAL MANOMETRY (EM) with [place];  Surgeon: Lucilla Lame, MD;  Location: [facility];  Service: Endoscopy;  Laterality: N/A;   ESOPHAGEAL MANOMETRY N/A 04/19/2017  Procedure: ESOPHAGEAL MANOMETRY (EM);  Surgeon: Wilford Corner, MD;  Location: [facility];  Service: Endoscopy;  Laterality: N/A;   FACIAL LACERATIONS REPAIR     INSERTION OF MESH N/A 06/02/2017   Procedure: INSERTION OF MESH;  Surgeon: Ralene Ok, MD;  Location: [facility];  Service: General;  Laterality: N/A;   Social History   Occupational History   Occupation: Chief Financial Officer  Tobacco Use   Smoking status: Never   Smokeless tobacco: Never  Vaping Use   Vaping Use: Never used  Substance and Sexual Activity   Alcohol use: No     Alcohol/week: 0.0 standard drinks   Drug use: No   Sexual activity: Never

## 2021-05-18 ENCOUNTER — Ambulatory Visit: Payer: Medicaid Other | Admitting: Orthopedic Surgery

## 2021-05-20 ENCOUNTER — Ambulatory Visit (INDEPENDENT_AMBULATORY_CARE_PROVIDER_SITE_OTHER): Payer: Medicaid Other | Admitting: Orthopedic Surgery

## 2021-05-20 ENCOUNTER — Other Ambulatory Visit: Payer: Self-pay

## 2021-05-20 ENCOUNTER — Encounter: Payer: Self-pay | Admitting: Orthopedic Surgery

## 2021-05-20 VITALS — BP 126/88 | HR 87

## 2021-05-20 DIAGNOSIS — M79645 Pain in left finger(s): Secondary | ICD-10-CM | POA: Diagnosis not present

## 2021-05-20 DIAGNOSIS — G8929 Other chronic pain: Secondary | ICD-10-CM

## 2021-05-20 MED ORDER — DICLOFENAC SODIUM 1 % EX GEL: 2.0000 g | Freq: Four times a day (QID) | CUTANEOUS | Status: AC

## 2021-05-20 NOTE — Progress Notes (Signed)
Office Visit Note   Patient: Brandon Clark           Date of Birth: [birth year]           MRN: [id number] Visit Date: 05/20/2021              Requested by: No referring provider defined for this encounter. PCP: Patient, No Pcp Per (Inactive)   Assessment & Plan: Visit Diagnoses:  1. Chronic pain of left thumb     Plan: Patient notes that most of his pain is related to the old scar at the left thenar eminence and at the MP joint.  His pain is worse when it gets cold.  He has been doing scar massage with lotion with noticeable softening of the scar.  Discussed that I could potentially revise the scar but can't guarantee that I would make his pain better.  He does have mild degenerative changes at the MP joint.  After our discussion, he is not interested in scar revision.  He will try anti-inflammatory medication for symptom relief as needed.   Follow-Up Instructions: No follow-ups on file.   Orders:  No orders of the defined types were placed in this encounter.  Meds ordered this encounter  Medications   diclofenac Sodium (VOLTAREN) 1 % topical gel 2 g      Procedures: No procedures performed   Clinical Data: No additional findings.   Subjective: Chief Complaint  Patient presents with   Left Hand - Follow-up    This is a 80 yo RHD M who presents for follow up of a remote left thumb injury. He had a laceration at the base of his thumb approximately 8-10 years ago with injury to the [employer] tendon and glass in the wound.  He was seen approximately two months ago with pain at the scar.  Today he reports that his pain is pretty minimal.  He has been doing scar massage at home. He does have pain at the MP joint when it gets cold.  He denies any numbness or paresthesias in the thumb.    Review of Systems   Objective: Vital Signs: BP 126/88 (BP Location: Right Arm, Patient Position: Sitting, Cuff Size: Large)   Pulse 87   SpO2 95%   Physical Exam Constitutional:       Appearance: Normal appearance.  Cardiovascular:     Rate and Rhythm: Normal rate.     Pulses: Normal pulses.  Pulmonary:     Effort: Pulmonary effort is normal.  Skin:    General: Skin is warm and dry.     Capillary Refill: Capillary refill takes less than 2 seconds.  Neurological:     Mental Status: He is alert.    Left Hand Exam   Tenderness  Left hand tenderness location: Minimally TTP at transverse portion of scar over thenar eminence.  No masses palpable.  No tinel along the digital nerves.   Range of Motion  The patient has normal left wrist ROM.  Other  Erythema: absent Sensation: normal Pulse: present     Specialty Comments:  No specialty comments available.  Imaging: No results found.   PMFS History: Patient Active Problem List   Diagnosis Date Noted   Laceration of flexor muscle, fascia and tendon of left thumb at forearm level, sequela 03/16/2021   S/P Nissen fundoplication (without gastrostomy tube) procedure 06/02/2017   Dementia ([facility]) 05/12/2017   Prediabetes 01/30/2017   Hiatal hernia 01/27/2017   Acid reflux 02/12/2016  Sleep apnea 02/12/2016   Diastasis recti 03/02/2015   Umbilical hernia 09/16/2014   BPV (benign positional vertigo) 07/01/2014   HTN (hypertension) 07/01/2014   Panic attack    Encephalopathy, hypertensive    Ataxia 06/30/2014   Vertigo 06/30/2014   Dyspepsia 04/03/2014   Encounter for colorectal cancer screening 04/03/2014   Bilateral shoulder pain 09/17/2013   Bilateral knee pain 02/05/2013   Past Medical History:  Diagnosis Date   Acid reflux    BPH (benign prostatic hyperplasia)    Hiatal hernia    Hyperlipidemia    Hypertension    Pre-diabetes    Sleep apnea    sometimes does not use d/t poor fitting of the mask   Ulcer     Family History  Problem Relation Age of Onset   Diabetes Mother    Diabetes Brother    Heart attack Neg Hx    Hyperlipidemia Neg Hx    Hypertension Neg Hx    Sudden death Neg  Hx     Past Surgical History:  Procedure Laterality Date   ESOPHAGEAL MANOMETRY N/A 04/27/2016   Procedure: ESOPHAGEAL MANOMETRY (EM) with PH;  Surgeon: Midge Minium, MD;  Location: [facility];  Service: Endoscopy;  Laterality: N/A;   ESOPHAGEAL MANOMETRY N/A 04/19/2017   Procedure: ESOPHAGEAL MANOMETRY (EM);  Surgeon: Charlott Rakes, MD;  Location: [facility];  Service: Endoscopy;  Laterality: N/A;   FACIAL LACERATIONS REPAIR     INSERTION OF MESH N/A 06/02/2017   Procedure: INSERTION OF MESH;  Surgeon: Axel Filler, MD;  Location: [facility];  Service: General;  Laterality: N/A;   Social History   Occupational History   Occupation: Art gallery manager  Tobacco Use   Smoking status: Never   Smokeless tobacco: Never  Vaping Use   Vaping Use: Never used  Substance and Sexual Activity   Alcohol use: No    Alcohol/week: 0.0 standard drinks   Drug use: No   Sexual activity: Never

## 2021-06-04 ENCOUNTER — Ambulatory Visit: Payer: Medicaid Other | Admitting: Surgical

## 2021-06-09 ENCOUNTER — Encounter (HOSPITAL_COMMUNITY): Payer: Self-pay | Admitting: Emergency Medicine

## 2021-06-09 ENCOUNTER — Ambulatory Visit (INDEPENDENT_AMBULATORY_CARE_PROVIDER_SITE_OTHER): Payer: Medicaid Other

## 2021-06-09 ENCOUNTER — Ambulatory Visit (HOSPITAL_COMMUNITY)
Admission: EM | Admit: 2021-06-09 | Discharge: 2021-06-09 | Disposition: A | Discharge status: Home / Self Care | Payer: Medicaid Other | Attending: Emergency Medicine | Admitting: Emergency Medicine

## 2021-06-09 ENCOUNTER — Other Ambulatory Visit: Payer: Self-pay

## 2021-06-09 DIAGNOSIS — J069 Acute upper respiratory infection, unspecified: Secondary | ICD-10-CM

## 2021-06-09 DIAGNOSIS — R059 Cough, unspecified: Secondary | ICD-10-CM | POA: Diagnosis not present

## 2021-06-09 DIAGNOSIS — Z20822 Contact with and (suspected) exposure to covid-19: Secondary | ICD-10-CM

## 2021-06-09 LAB — POC INFLUENZA A AND B ANTIGEN (URGENT CARE ONLY)
INFLUENZA A ANTIGEN, POC: NEGATIVE
INFLUENZA B ANTIGEN, POC: NEGATIVE

## 2021-06-09 MED ORDER — ALBUTEROL SULFATE HFA 108 (90 BASE) MCG/ACT IN AERS: 1.0000 | INHALATION_SPRAY | RESPIRATORY_TRACT | 0 refills | Status: DC | PRN

## 2021-06-09 MED ORDER — BENZONATATE 200 MG PO CAPS: 200.0000 mg | ORAL_CAPSULE | Freq: Three times a day (TID) | ORAL | 0 refills | Status: DC | PRN

## 2021-06-09 MED ORDER — FLUTICASONE PROPIONATE 50 MCG/ACT NA SUSP: 2.0000 | Freq: Every day | NASAL | 0 refills | Status: DC

## 2021-06-09 MED ORDER — AEROCHAMBER PLUS MISC: 2 refills | Status: DC

## 2021-06-09 NOTE — Discharge Instructions (Addendum)
Your chest x-ray and influenza were both unremarkable.  Your COVID will be back tomorrow.  I will prescribe Molnupiravir if your COVID is positive.  In the meantime, take 2 puffs from your albuterol inhaler using your spacer every 4 hours for 2 days, then every 6 hours for 2 days, then as needed.  You may back off on the albuterol if you start to feel better.  Continue Mucinex.  Tessalon for the cough.  Saline nasal irrigation with a Lloyd Huger Med rinse and distilled water as often as you want, Flonase to also help with the nasal congestion.

## 2021-06-09 NOTE — ED Provider Notes (Signed)
HPI  SUBJECTIVE:  Brandon Clark is a 80 y.o. male who presents with 3 days of headache, nasal congestion, yellow rhinorrhea, sore throat, cough productive of yellow-green mucus, wheezing at night and chest congestion.  No fevers, sinus pain or pressure, postnasal drip, loss of sense of smell or taste, shortness of breath, dyspnea on exertion, nausea, vomiting, diarrhea, abdominal pain.  No GERD symptoms.  No known COVID or flu exposure.  He got the first dose of the COVID-vaccine.  He did not yet get the flu vaccine.  No antibiotics in the past month.  No antipyretic in the past 6 hours.  He has tried NyQuil and Mucinex with improvement in his symptoms.  No aggravating factors.  He has a past medical history of hypertension, GERD status post Nissen fundoplication, hypercholesterolemia, prostate cancer and early Alzheimer's.  Chart review shows he had COVID in December 21.  No history of pulmonary disease, smoking.  PMD: Cannot remember.  Community health and wellness per chart review.  Past Medical History:  Diagnosis Date   Acid reflux    BPH (benign prostatic hyperplasia)    Hiatal hernia    Hyperlipidemia    Hypertension    Pre-diabetes    Sleep apnea    sometimes does not use d/t poor fitting of the mask   Ulcer     Past Surgical History:  Procedure Laterality Date   ESOPHAGEAL MANOMETRY N/A 04/27/2016   Procedure: ESOPHAGEAL MANOMETRY (EM) with PH;  Surgeon: Midge Minium, MD;  Location: [facility];  Service: Endoscopy;  Laterality: N/A;   ESOPHAGEAL MANOMETRY N/A 04/19/2017   Procedure: ESOPHAGEAL MANOMETRY (EM);  Surgeon: Charlott Rakes, MD;  Location: [facility];  Service: Endoscopy;  Laterality: N/A;   FACIAL LACERATIONS REPAIR     INSERTION OF MESH N/A 06/02/2017   Procedure: INSERTION OF MESH;  Surgeon: Axel Filler, MD;  Location: [facility];  Service: General;  Laterality: N/A;    Family History  Problem Relation Age of Onset   Diabetes Mother     Diabetes Brother    Heart attack Neg Hx    Hyperlipidemia Neg Hx    Hypertension Neg Hx    Sudden death Neg Hx     Social History   Tobacco Use   Smoking status: Never   Smokeless tobacco: Never  Vaping Use   Vaping Use: Never used  Substance Use Topics   Alcohol use: No    Alcohol/week: 0.0 standard drinks   Drug use: No    No current facility-administered medications for this encounter.  Current Outpatient Medications:    albuterol (VENTOLIN HFA) 108 (90 Base) MCG/ACT inhaler, Inhale 1-2 puffs into the lungs every 4 (four) hours as needed for wheezing or shortness of breath., Disp: 1 each, Rfl: 0   benzonatate (TESSALON) 200 MG capsule, Take 1 capsule (200 mg total) by mouth 3 (three) times daily as needed for cough., Disp: 30 capsule, Rfl: 0   fluticasone (FLONASE) 50 MCG/ACT nasal spray, Place 2 sprays into both nostrils daily., Disp: 16 g, Rfl: 0   Spacer/Aero-Holding Chambers (AEROCHAMBER PLUS) inhaler, Use with inhaler, Disp: 1 each, Rfl: 2   acetaminophen (TYLENOL) 500 MG tablet, Take 500-1,000 mg by mouth every 6 (six) hours as needed., Disp: , Rfl:    aspirin EC 325 MG tablet, Take 1 tablet (325 mg total) by mouth daily. (Patient not taking: Reported on 10/16/2019), Disp: 30 tablet, Rfl: 0   donepezil (ARICEPT) 10 MG tablet, Take 1 tablet (10 mg total)  by mouth at bedtime. (Patient not taking: Reported on 10/16/2019), Disp: 30 tablet, Rfl: 5   finasteride (PROSCAR) 5 MG tablet, Take 1 tablet (5 mg total) by mouth daily. (Patient not taking: Reported on 06/09/2021), Disp: 30 tablet, Rfl: 3   hydrochlorothiazide (HYDRODIURIL) 12.5 MG tablet, Take 1 tablet (12.5 mg total) by mouth daily. (Patient not taking: Reported on 10/16/2019), Disp: 30 tablet, Rfl: 5   methocarbamol (ROBAXIN) 500 MG tablet, Take 500 mg by mouth every 8 (eight) hours as needed for muscle spasms., Disp: , Rfl:    simvastatin (ZOCOR) 10 MG tablet, Take 1 tablet (10 mg total) by mouth at bedtime. (Patient not  taking: Reported on 06/09/2021), Disp: 30 tablet, Rfl: 5   tamsulosin (FLOMAX) 0.4 MG CAPS capsule, Take 1 capsule (0.4 mg total) by mouth daily., Disp: 30 capsule, Rfl: 5   traZODone (DESYREL) 50 MG tablet, Take 1 tablet (50 mg total) by mouth at bedtime as needed for sleep., Disp: 30 tablet, Rfl: 3  Allergies  Allergen Reactions   Ondansetron Itching    Eye swelling     ROS  As noted in HPI.   Physical Exam  BP 121/86 (BP Location: Right Arm) Comment (BP Location): large cuff  Pulse 97   Temp 98.3 F (36.8 C) (Oral)   Resp (!) 22   SpO2 94%  Constitutional: Well developed, well nourished, no acute distress Eyes:  EOMI, conjunctiva normal bilaterally HENT: Normocephalic, atraumatic,mucus membranes moist.  Positive nasal congestion.  No maxillary, frontal sinus tenderness.  No postnasal drip. Respiratory: Normal inspiratory effort, lungs clear bilaterally, good air movement.  No anterior, lateral chest wall tenderness Cardiovascular: Normal rate and rhythm, no murmurs rubs or gallops GI: nondistended skin: No rash, skin intact Musculoskeletal: no deformities Neurologic: Alert & oriented x 3, no focal neuro deficits Psychiatric: Speech and behavior appropriate   ED Course   Medications - No data to display  Orders Placed This Encounter  Procedures   SARS CORONAVIRUS 2 (TAT 6-24 HRS) Nasopharyngeal Nasopharyngeal Swab    Standing Status:   Standing    Number of Occurrences:   1   DG Chest 2 View    Standing Status:   Standing    Number of Occurrences:   1    Order Specific Question:   Reason for Exam (SYMPTOM  OR DIAGNOSIS REQUIRED)    Answer:   cough r/o PNA   Droplet precaution    Standing Status:   Standing    Number of Occurrences:   1   POC Influenza A & B Ag (Urgent Care)    Standing Status:   Standing    Number of Occurrences:   1    Results for orders placed or performed during the hospital encounter of 06/09/21 (from the past 24 hour(s))  POC Influenza  A & B Ag (Urgent Care)     Status: None   Collection Time: 06/09/21  3:36 PM  Result Value Ref Range   INFLUENZA A ANTIGEN, POC NEGATIVE NEGATIVE   INFLUENZA B ANTIGEN, POC NEGATIVE NEGATIVE   DG Chest 2 View  Result Date: 06/09/2021 CLINICAL DATA:  Cough. EXAM: CHEST - 2 VIEW COMPARISON:  May 28, 2020. FINDINGS: Stable cardiomediastinal silhouette. Right lung is clear. Minimal left basilar subsegmental atelectasis or scarring is noted. Bony thorax is unremarkable. IMPRESSION: Minimal left basilar subsegmental atelectasis or scarring. Electronically Signed   By: Lupita Raider M.D.   On: 06/09/2021 14:47    ED Clinical Impression  1. Viral URI with cough   2. Encounter for laboratory testing for COVID-19 virus      ED Assessment/Plan  Checking chest x-ray, COVID, flu.  He will qualify for Molnupiravir because of his age and he is partially vaccinated.  Will send home with Tamiflu if flu is positive.  Antibiotics if chest x-ray positive for pneumonia.  Patient states that he needs an interpreter if we are to call him.  Reviewed imaging independently.  Minimal left basilar subsegmental atelectasis or scarring. See radiology report for full details.  Chest x-ray negative for any acute changes- Minimal left basilar subsegmental atelectasis or scarring.  Influenza negative.   Home with regularly scheduled albuterol inhaler using a spacer for the next 4 days, then as needed, Tessalon, continue Mucinex, saline nasal irrigation, Flonase.  Follow-up with PMD.  ER return precautions given.    Using the video interpreter, discussed labs, imaging, MDM, treatment plan, and plan for follow-up with patient. Discussed sn/sx that should prompt return to the ED. answered all questions. patient agrees with plan.   Spent over 30 minutes in the care of this patient.  Meds ordered this encounter  Medications   albuterol (VENTOLIN HFA) 108 (90 Base) MCG/ACT inhaler    Sig: Inhale 1-2 puffs into the  lungs every 4 (four) hours as needed for wheezing or shortness of breath.    Dispense:  1 each    Refill:  0   Spacer/Aero-Holding Chambers (AEROCHAMBER PLUS) inhaler    Sig: Use with inhaler    Dispense:  1 each    Refill:  2    Please educate patient on use   benzonatate (TESSALON) 200 MG capsule    Sig: Take 1 capsule (200 mg total) by mouth 3 (three) times daily as needed for cough.    Dispense:  30 capsule    Refill:  0   fluticasone (FLONASE) 50 MCG/ACT nasal spray    Sig: Place 2 sprays into both nostrils daily.    Dispense:  16 g    Refill:  0     *This clinic note was created using Scientist, clinical (histocompatibility and immunogenetics). Therefore, there may be occasional mistakes despite careful proofreading.  ?    Domenick Gong, MD 06/09/21 [phone]

## 2021-06-09 NOTE — ED Triage Notes (Signed)
Cough, congestion, wheezing and cough worse at night and sob.  Onset of symptoms was 3 days ago.

## 2021-06-09 NOTE — ED Notes (Signed)
Called patient in lobby, patient is reportedly in bathroom

## 2021-06-10 LAB — SARS CORONAVIRUS 2 (TAT 6-24 HRS): SARS Coronavirus 2: NEGATIVE

## 2021-06-23 DIAGNOSIS — F321 Major depressive disorder, single episode, moderate: Secondary | ICD-10-CM | POA: Diagnosis not present

## 2021-06-23 DIAGNOSIS — F419 Anxiety disorder, unspecified: Secondary | ICD-10-CM | POA: Diagnosis not present

## 2021-06-23 DIAGNOSIS — N3941 Urge incontinence: Secondary | ICD-10-CM | POA: Diagnosis not present

## 2021-06-23 DIAGNOSIS — R413 Other amnesia: Secondary | ICD-10-CM | POA: Diagnosis not present

## 2021-06-23 DIAGNOSIS — R350 Frequency of micturition: Secondary | ICD-10-CM | POA: Diagnosis not present

## 2021-06-23 DIAGNOSIS — N401 Enlarged prostate with lower urinary tract symptoms: Secondary | ICD-10-CM | POA: Diagnosis not present

## 2021-06-23 DIAGNOSIS — M5442 Lumbago with sciatica, left side: Secondary | ICD-10-CM | POA: Diagnosis not present

## 2021-06-23 DIAGNOSIS — I674 Hypertensive encephalopathy: Secondary | ICD-10-CM | POA: Diagnosis not present

## 2021-06-23 DIAGNOSIS — G4700 Insomnia, unspecified: Secondary | ICD-10-CM | POA: Diagnosis not present

## 2021-06-23 DIAGNOSIS — R7303 Prediabetes: Secondary | ICD-10-CM | POA: Diagnosis not present

## 2021-06-23 DIAGNOSIS — E781 Pure hyperglyceridemia: Secondary | ICD-10-CM | POA: Diagnosis not present

## 2021-07-07 ENCOUNTER — Encounter: Payer: Self-pay | Admitting: Orthopedic Surgery

## 2021-07-07 ENCOUNTER — Other Ambulatory Visit: Payer: Self-pay

## 2021-07-07 ENCOUNTER — Ambulatory Visit (INDEPENDENT_AMBULATORY_CARE_PROVIDER_SITE_OTHER): Payer: Medicaid Other | Admitting: Orthopedic Surgery

## 2021-07-07 DIAGNOSIS — M19072 Primary osteoarthritis, left ankle and foot: Secondary | ICD-10-CM | POA: Diagnosis not present

## 2021-07-07 DIAGNOSIS — M1712 Unilateral primary osteoarthritis, left knee: Secondary | ICD-10-CM

## 2021-07-07 DIAGNOSIS — M170 Bilateral primary osteoarthritis of knee: Secondary | ICD-10-CM

## 2021-07-07 DIAGNOSIS — M545 Low back pain, unspecified: Secondary | ICD-10-CM | POA: Diagnosis not present

## 2021-07-07 DIAGNOSIS — M1711 Unilateral primary osteoarthritis, right knee: Secondary | ICD-10-CM | POA: Diagnosis not present

## 2021-07-08 ENCOUNTER — Encounter: Payer: Self-pay | Admitting: Orthopedic Surgery

## 2021-07-08 MED ORDER — LIDOCAINE HCL 1 % IJ SOLN
5.0000 mL | INTRAMUSCULAR | Status: AC | PRN
  Administered 2021-07-07: 5 mL

## 2021-07-08 NOTE — Progress Notes (Addendum)
Office Visit Note   Patient: Brandon Clark           Date of Birth: August 05, 1940           MRN: [id number] Visit Date: 07/07/2021 Requested by: No referring provider defined for this encounter. PCP: Patient, No Pcp Per (Inactive)  Subjective: Chief Complaint  Patient presents with   Lower Back - Follow-up    HPI: Patient presents for evaluation of low back pain as well as left knee effusion.  He has had an MRI scan which shows severe right foraminal L5-S1 stenosis.  To a lesser degree the left side is involved.  Clinically his left side hurts more than the right side.  He also describes left knee effusion which is bothersome for him.  He is about 1 month too soon for cortisone injection but would like to have that knee aspirated.  Here with a Optometrist.              ROS: All systems reviewed are negative as they relate to the chief complaint within the history of present illness.  Patient denies  fevers or chills.   Assessment & Plan: Visit Diagnoses:  1. Low back pain, unspecified back pain laterality, unspecified chronicity, unspecified whether sciatica present     Plan: Impression is foraminal stenosis in the back with both left and right-sided symptoms.  Plan is refer to Dr. Ernestina Patches for lumbar spine ESI and evaluation as to the optimal injection strategy to prevent surgical intervention.  Left knee is also aspirated today.  Follow-up with Dr. Ernestina Patches in the near future and with [place] as needed.  Follow-Up Instructions: No follow-ups on file.   Orders:  Orders Placed This Encounter  Procedures   Ambulatory referral to Physical Medicine Rehab   No orders of the defined types were placed in this encounter.     Procedures: Large Joint Inj: L knee on 07/07/2021 7:32 AM Indications: diagnostic evaluation, joint swelling and pain Details: 18 G 1.5 in needle, superolateral approach  Arthrogram: No  Medications: 5 mL lidocaine 1 % Outcome: tolerated well, no immediate  complications Procedure, treatment alternatives, risks and benefits explained, specific risks discussed. Consent was given by the patient. Immediately prior to procedure a time out was called to verify the correct patient, procedure, equipment, support staff and site/side marked as required. Patient was prepped and draped in the usual sterile fashion.      Clinical Data: No additional findings.  Objective: Vital Signs: There were no vitals taken for this visit.  Physical Exam:   Constitutional: Patient appears well-developed HEENT:  Head: Normocephalic Eyes:EOM are normal Neck: Normal range of motion Cardiovascular: Normal rate Pulmonary/chest: Effort normal Neurologic: Patient is alert Skin: Skin is warm Psychiatric: Patient has normal mood and affect   Ortho Exam: Ortho exam demonstrates full active and passive range of motion of the hips and ankles.  Left knee does have an effusion but stable collateral cruciate ligaments.  Mild varus alignment is present.  Extensor mechanism is intact.  No groin pain with internal ex rotation of the leg.  Patient also has good ankle dorsiflexion plantarflexion quad hamstring strength with no nerve root tension signs.  Mild pain with forward and lateral bending but no atrophy in the leg muscles.  No definite paresthesias L1-S1 bilaterally.  Specialty Comments:  No specialty comments available.  Imaging: No results found.   PMFS History: Patient Active Problem List   Diagnosis Date Noted   Laceration of flexor muscle,  fascia and tendon of left thumb at forearm level, sequela 03/16/2021   S/P Nissen fundoplication (without gastrostomy tube) procedure 06/02/2017   Dementia ([place]) 05/12/2017   Prediabetes 01/30/2017   Hiatal hernia 01/27/2017   Acid reflux 02/12/2016   Sleep apnea 02/12/2016   Diastasis recti XX123456   Umbilical hernia [id number]   BPV (benign positional vertigo) 07/01/2014   HTN (hypertension) 07/01/2014   Panic  attack    Encephalopathy, hypertensive    Ataxia 06/30/2014   Vertigo 06/30/2014   Dyspepsia 04/03/2014   Encounter for colorectal cancer screening 04/03/2014   Bilateral shoulder pain 09/17/2013   Bilateral knee pain 02/05/2013   Past Medical History:  Diagnosis Date   Acid reflux    BPH (benign prostatic hyperplasia)    Hiatal hernia    Hyperlipidemia    Hypertension    Pre-diabetes    Sleep apnea    sometimes does not use d/t poor fitting of the mask   Ulcer     Family History  Problem Relation Age of Onset   Diabetes Mother    Diabetes Brother    Heart attack Neg Hx    Hyperlipidemia Neg Hx    Hypertension Neg Hx    Sudden death Neg Hx     Past Surgical History:  Procedure Laterality Date   ESOPHAGEAL MANOMETRY N/A 04/27/2016   Procedure: ESOPHAGEAL MANOMETRY (EM) with [place];  Surgeon: Lucilla Lame, MD;  Location: [facility];  Service: Endoscopy;  Laterality: N/A;   ESOPHAGEAL MANOMETRY N/A 04/19/2017   Procedure: ESOPHAGEAL MANOMETRY (EM);  Surgeon: Wilford Corner, MD;  Location: [facility];  Service: Endoscopy;  Laterality: N/A;   FACIAL LACERATIONS REPAIR     INSERTION OF MESH N/A 06/02/2017   Procedure: INSERTION OF MESH;  Surgeon: Ralene Ok, MD;  Location: [facility];  Service: General;  Laterality: N/A;   Social History   Occupational History   Occupation: Chief Financial Officer  Tobacco Use   Smoking status: Never   Smokeless tobacco: Never  Vaping Use   Vaping Use: Never used  Substance and Sexual Activity   Alcohol use: No    Alcohol/week: 0.0 standard drinks   Drug use: No   Sexual activity: Never

## 2021-08-02 ENCOUNTER — Other Ambulatory Visit: Payer: Self-pay

## 2021-08-02 ENCOUNTER — Ambulatory Visit (INDEPENDENT_AMBULATORY_CARE_PROVIDER_SITE_OTHER): Payer: Medicaid Other | Admitting: Surgical

## 2021-08-02 ENCOUNTER — Telehealth: Payer: Self-pay

## 2021-08-02 DIAGNOSIS — M19072 Primary osteoarthritis, left ankle and foot: Secondary | ICD-10-CM | POA: Diagnosis not present

## 2021-08-02 NOTE — Telephone Encounter (Signed)
Can we get auth for left knee gel injection? 

## 2021-08-03 NOTE — Telephone Encounter (Signed)
Noted  

## 2021-08-11 ENCOUNTER — Ambulatory Visit: Payer: Medicaid Other | Admitting: Physical Medicine and Rehabilitation

## 2021-08-14 ENCOUNTER — Ambulatory Visit
Admission: RE | Admit: 2021-08-14 | Discharge: 2021-08-14 | Disposition: A | Discharge status: Home / Self Care | Payer: Medicaid Other | Source: Ambulatory Visit | Attending: Surgical | Admitting: Surgical

## 2021-08-14 ENCOUNTER — Other Ambulatory Visit: Payer: Self-pay

## 2021-08-14 DIAGNOSIS — R200 Anesthesia of skin: Secondary | ICD-10-CM | POA: Diagnosis not present

## 2021-08-14 DIAGNOSIS — M25572 Pain in left ankle and joints of left foot: Secondary | ICD-10-CM | POA: Diagnosis not present

## 2021-08-14 DIAGNOSIS — R531 Weakness: Secondary | ICD-10-CM | POA: Diagnosis not present

## 2021-08-14 DIAGNOSIS — M19072 Primary osteoarthritis, left ankle and foot: Secondary | ICD-10-CM

## 2021-08-14 DIAGNOSIS — M7989 Other specified soft tissue disorders: Secondary | ICD-10-CM | POA: Diagnosis not present

## 2021-08-18 ENCOUNTER — Ambulatory Visit: Payer: Medicaid Other | Admitting: Orthopedic Surgery

## 2021-08-22 ENCOUNTER — Encounter: Payer: Self-pay | Admitting: Orthopedic Surgery

## 2021-08-22 NOTE — Progress Notes (Signed)
Office Visit Note   Patient: Brandon Clark           Date of Birth: 09/01/1940           MRN: [id number] Visit Date: 08/02/2021 Requested by: No referring provider defined for this encounter. PCP: Stevphen Rochester, MD  Subjective: Chief Complaint  Patient presents with   Left Leg - Pain   Lower Back - Pain    HPI: Brandon Clark is a 81 y.o. male who presents to the office complaining of left ankle pain.  Patient returns requesting evaluation of left ankle pain.  Mostly localizes it to the anterior medial aspect of the ankle.  He has had previous ankle injections before with good relief from his pain but pain always returns.  Previous radiographs have not demonstrated much in the way of degenerative changes in the ankle.  He also complains of left knee pain.  Last left knee injection was 07/07/2021.  Currently taking nothing for pain control.  No recent injuries..                ROS: All systems reviewed are negative as they relate to the chief complaint within the history of present illness.  Patient denies fevers or chills.  Assessment & Plan: Visit Diagnoses:  1. Primary osteoarthritis, left ankle and foot     Plan: Patient is a 81 year old male who presents for evaluation of left ankle pain.  He has had prior injections of the left ankle that have provided good relief for him but there is no pathology on radiographs that really explain his recurrent pain in this ankle.  With recurrence of previously treated symptoms, plan to order MRI of the left ankle for further evaluation.  Follow-up after MRI to review results.  Also plan to preapproved left knee for gel injection as he has had continued pain in the left knee despite cortisone injection on 07/07/2021.  This patient is diagnosed with osteoarthritis of the knee(s).    Radiographs show evidence of joint space narrowing, osteophytes, subchondral sclerosis and/or subchondral cysts.  This patient has knee pain  which interferes with functional and activities of daily living.    This patient has experienced inadequate response, adverse effects and/or intolerance with conservative treatments such as acetaminophen, NSAIDS, topical creams, physical therapy or regular exercise, knee bracing and/or weight loss.   This patient has experienced inadequate response or has a contraindication to intra articular steroid injections for at least 3 months.   This patient is not scheduled to have a total knee replacement within 6 months of starting treatment with viscosupplementation.   Follow-Up Instructions: No follow-ups on file.   Orders:  Orders Placed This Encounter  Procedures   MR Ankle Left w/o contrast   No orders of the defined types were placed in this encounter.     Procedures: No procedures performed   Clinical Data: No additional findings.  Objective: Vital Signs: There were no vitals taken for this visit.  Physical Exam:  Constitutional: Patient appears well-developed HEENT:  Head: Normocephalic Eyes:EOM are normal Neck: Normal range of motion Cardiovascular: Normal rate Pulmonary/chest: Effort normal Neurologic: Patient is alert Skin: Skin is warm Psychiatric: Patient has normal mood and affect  Ortho Exam: Ortho exam demonstrates left ankle with intact dorsiflexion, plantarflexion, inversion, eversion.  Palpable DP pulse.  No calf tenderness.  Negative Homans' sign.  Tenderness over the ankle joint line.  No tenderness over the Lisfranc complex or fifth metatarsal base.  No  bruising/swelling noted.  Specialty Comments:  No specialty comments available.  Imaging: No results found.   PMFS History: Patient Active Problem List   Diagnosis Date Noted   Laceration of flexor muscle, fascia and tendon of left thumb at forearm level, sequela 03/16/2021   S/P Nissen fundoplication (without gastrostomy tube) procedure 06/02/2017   Dementia (HCC) 05/12/2017   Prediabetes  01/30/2017   Hiatal hernia 01/27/2017   Acid reflux 02/12/2016   Sleep apnea 02/12/2016   Diastasis recti 03/02/2015   Umbilical hernia 09/16/2014   BPV (benign positional vertigo) 07/01/2014   HTN (hypertension) 07/01/2014   Panic attack    Encephalopathy, hypertensive    Ataxia 06/30/2014   Vertigo 06/30/2014   Dyspepsia 04/03/2014   Encounter for colorectal cancer screening 04/03/2014   Bilateral shoulder pain 09/17/2013   Bilateral knee pain 02/05/2013   Past Medical History:  Diagnosis Date   Acid reflux    BPH (benign prostatic hyperplasia)    Hiatal hernia    Hyperlipidemia    Hypertension    Pre-diabetes    Sleep apnea    sometimes does not use d/t poor fitting of the mask   Ulcer     Family History  Problem Relation Age of Onset   Diabetes Mother    Diabetes Brother    Heart attack Neg Hx    Hyperlipidemia Neg Hx    Hypertension Neg Hx    Sudden death Neg Hx     Past Surgical History:  Procedure Laterality Date   ESOPHAGEAL MANOMETRY N/A 04/27/2016   Procedure: ESOPHAGEAL MANOMETRY (EM) with PH;  Surgeon: Midge Minium, MD;  Location: [facility];  Service: Endoscopy;  Laterality: N/A;   ESOPHAGEAL MANOMETRY N/A 04/19/2017   Procedure: ESOPHAGEAL MANOMETRY (EM);  Surgeon: Charlott Rakes, MD;  Location: [facility];  Service: Endoscopy;  Laterality: N/A;   FACIAL LACERATIONS REPAIR     INSERTION OF MESH N/A 06/02/2017   Procedure: INSERTION OF MESH;  Surgeon: Axel Filler, MD;  Location: [facility];  Service: General;  Laterality: N/A;   Social History   Occupational History   Occupation: Art gallery manager  Tobacco Use   Smoking status: Never   Smokeless tobacco: Never  Vaping Use   Vaping Use: Never used  Substance and Sexual Activity   Alcohol use: No    Alcohol/week: 0.0 standard drinks   Drug use: No   Sexual activity: Never

## 2021-09-01 ENCOUNTER — Other Ambulatory Visit: Payer: Self-pay

## 2021-09-01 ENCOUNTER — Telehealth: Payer: Self-pay

## 2021-09-01 ENCOUNTER — Ambulatory Visit (INDEPENDENT_AMBULATORY_CARE_PROVIDER_SITE_OTHER): Payer: Medicaid Other | Admitting: Orthopedic Surgery

## 2021-09-01 DIAGNOSIS — M1712 Unilateral primary osteoarthritis, left knee: Secondary | ICD-10-CM | POA: Diagnosis not present

## 2021-09-01 DIAGNOSIS — G8929 Other chronic pain: Secondary | ICD-10-CM | POA: Diagnosis not present

## 2021-09-01 DIAGNOSIS — M19072 Primary osteoarthritis, left ankle and foot: Secondary | ICD-10-CM

## 2021-09-01 DIAGNOSIS — M79645 Pain in left finger(s): Secondary | ICD-10-CM

## 2021-09-01 NOTE — Telephone Encounter (Signed)
VOB pending for SYnviscOne, left knee. ? ?

## 2021-09-03 ENCOUNTER — Encounter: Payer: Self-pay | Admitting: Orthopedic Surgery

## 2021-09-03 DIAGNOSIS — M19072 Primary osteoarthritis, left ankle and foot: Secondary | ICD-10-CM

## 2021-09-03 DIAGNOSIS — M79645 Pain in left finger(s): Secondary | ICD-10-CM | POA: Diagnosis not present

## 2021-09-03 DIAGNOSIS — M1712 Unilateral primary osteoarthritis, left knee: Secondary | ICD-10-CM | POA: Diagnosis not present

## 2021-09-03 DIAGNOSIS — G8929 Other chronic pain: Secondary | ICD-10-CM | POA: Diagnosis not present

## 2021-09-03 MED ORDER — LIDOCAINE HCL 1 % IJ SOLN
3.0000 mL | INTRAMUSCULAR | Status: AC | PRN
  Administered 2021-09-03: 3 mL

## 2021-09-03 MED ORDER — BUPIVACAINE HCL 0.5 % IJ SOLN
1.0000 mL | INTRAMUSCULAR | Status: AC | PRN
Start: 2021-09-03 — End: 2021-09-03
  Administered 2021-09-03: 1 mL via INTRA_ARTICULAR

## 2021-09-03 MED ORDER — METHYLPREDNISOLONE ACETATE 40 MG/ML IJ SUSP
40.0000 mg | INTRAMUSCULAR | Status: AC | PRN
  Administered 2021-09-03: 40 mg via INTRA_ARTICULAR

## 2021-09-03 NOTE — Progress Notes (Signed)
? ?Office Visit Note ?  ?Patient: Brandon Clark           ?Date of Birth: 02/03/1941           ?MRN: [id number] ?Visit Date: 09/01/2021 ?Requested by: Stevphen Rochester, MD ?[phone] ?[address] ?[place],  [place] [address] ?PCP: Stevphen Rochester, MD ? ?Subjective: ?Chief Complaint  ?Patient presents with  ? Other  ?   ?Scan review  ? ? ?HPI: Patient presents for evaluation of left ankle.  Was noted to have stress reaction in some of the bones along with posterior lateral impingement.  This involved the lateral talus and the lateral calcaneus.  Vitamin D was 29 in 2014.  He does take vitamin D on a daily basis.  Has had prior ankle joint injection with good relief in the past. ?             ?ROS: All systems reviewed are negative as they relate to the chief complaint within the history of present illness.  Patient denies  fevers or chills. ? ? ?Assessment & Plan: ?Visit Diagnoses:  ?1. Chronic pain of left thumb   ?2. Unilateral primary osteoarthritis, left knee   ?3. Primary osteoarthritis, left ankle and foot   ? ? ?Plan: Impression is left ankle pain plan is left ankle injection.  Did that in the joint.  He is not a great candidate for any type of arthroscopic debridement.  Follow-up next week for evaluation with [facility] regarding his knees.  It does look like he has some left knee effusion today. ? ?Follow-Up Instructions: No follow-ups on file.  ? ?Orders:  ?No orders of the defined types were placed in this encounter. ? ?No orders of the defined types were placed in this encounter. ? ? ? ? Procedures: ?Medium Joint Inj: L ankle on 09/03/2021 11:41 PM ?Indications: pain, joint swelling and diagnostic evaluation ?Details: 22 G 1.5 in needle, anteromedial approach ?Medications: 3 mL lidocaine 1 %; 1 mL bupivacaine 0.5 %; 40 mg methylPREDNISolone acetate 40 MG/ML (1/2 mL bupivacaine 0.5 %) ?Outcome: tolerated well, no immediate complications ?Procedure, treatment alternatives, risks and benefits  explained, specific risks discussed. Consent was given by the patient. Immediately prior to procedure a time out was called to verify the correct patient, procedure, equipment, support staff and site/side marked as required. Patient was prepped and draped in the usual sterile fashion.  ? ? ? ? ?Clinical Data: ?No additional findings. ? ?Objective: ?Vital Signs: There were no vitals taken for this visit. ? ?Physical Exam:  ? ?Constitutional: Patient appears well-developed ?HEENT:  ?Head: Normocephalic ?Eyes:EOM are normal ?Neck: Normal range of motion ?Cardiovascular: Normal rate ?Pulmonary/chest: Effort normal ?Neurologic: Patient is alert ?Skin: Skin is warm ?Psychiatric: Patient has normal mood and affect ? ? ?Ortho Exam: Ortho exam demonstrates mild synovitis in that left ankle region.  Palpable intact nontender anterior to posterior to peroneal and Achilles tendons.  Has a little bit of acquired flatfoot deformity.  Symmetric tibiotalar subtalar transverse tarsal range of motion.  No pain with pronation supination of the forefoot. ? ?Specialty Comments:  ?No specialty comments available. ? ?Imaging: ?No results found. ? ? ?PMFS History: ?Patient Active Problem List  ? Diagnosis Date Noted  ? Laceration of flexor muscle, fascia and tendon of left thumb at forearm level, sequela 03/16/2021  ? S/P Nissen fundoplication (without gastrostomy tube) procedure 06/02/2017  ? Dementia (HCC) 05/12/2017  ? Prediabetes 01/30/2017  ? Hiatal hernia 01/27/2017  ?  Acid reflux 02/12/2016  ? Sleep apnea 02/12/2016  ? Diastasis recti 03/02/2015  ? Umbilical hernia 09/16/2014  ? BPV (benign positional vertigo) 07/01/2014  ? HTN (hypertension) 07/01/2014  ? Panic attack   ? Encephalopathy, hypertensive   ? Ataxia 06/30/2014  ? Vertigo 06/30/2014  ? Dyspepsia 04/03/2014  ? Encounter for colorectal cancer screening 04/03/2014  ? Bilateral shoulder pain 09/17/2013  ? Bilateral knee pain 02/05/2013  ? ?Past Medical History:  ?Diagnosis  Date  ? Acid reflux   ? BPH (benign prostatic hyperplasia)   ? Hiatal hernia   ? Hyperlipidemia   ? Hypertension   ? Pre-diabetes   ? Sleep apnea   ? sometimes does not use d/t poor fitting of the mask  ? Ulcer   ?  ?Family History  ?Problem Relation Age of Onset  ? Diabetes Mother   ? Diabetes Brother   ? Heart attack Neg Hx   ? Hyperlipidemia Neg Hx   ? Hypertension Neg Hx   ? Sudden death Neg Hx   ?  ?Past Surgical History:  ?Procedure Laterality Date  ? ESOPHAGEAL MANOMETRY N/A 04/27/2016  ? Procedure: ESOPHAGEAL MANOMETRY (EM) with PH;  Surgeon: Midge Minium, MD;  Location: [facility];  Service: Endoscopy;  Laterality: N/A;  ? ESOPHAGEAL MANOMETRY N/A 04/19/2017  ? Procedure: ESOPHAGEAL MANOMETRY (EM);  Surgeon: Charlott Rakes, MD;  Location: [facility];  Service: Endoscopy;  Laterality: N/A;  ? FACIAL LACERATIONS REPAIR    ? INSERTION OF MESH N/A 06/02/2017  ? Procedure: INSERTION OF MESH;  Surgeon: Axel Filler, MD;  Location: [facility];  Service: General;  Laterality: N/A;  ? ?Social History  ? ?Occupational History  ? Occupation: Art gallery manager  ?Tobacco Use  ? Smoking status: Never  ? Smokeless tobacco: Never  ?Vaping Use  ? Vaping Use: Never used  ?Substance and Sexual Activity  ? Alcohol use: No  ?  Alcohol/week: 0.0 standard drinks  ? Drug use: No  ? Sexual activity: Never  ? ? ? ? ? ?

## 2021-09-17 ENCOUNTER — Ambulatory Visit (INDEPENDENT_AMBULATORY_CARE_PROVIDER_SITE_OTHER): Payer: Medicaid Other | Admitting: Surgical

## 2021-09-17 ENCOUNTER — Other Ambulatory Visit: Payer: Self-pay

## 2021-09-17 DIAGNOSIS — M1712 Unilateral primary osteoarthritis, left knee: Secondary | ICD-10-CM | POA: Diagnosis not present

## 2021-09-17 DIAGNOSIS — M170 Bilateral primary osteoarthritis of knee: Secondary | ICD-10-CM

## 2021-09-17 DIAGNOSIS — M1711 Unilateral primary osteoarthritis, right knee: Secondary | ICD-10-CM

## 2021-09-19 ENCOUNTER — Encounter: Payer: Self-pay | Admitting: Surgical

## 2021-09-19 MED ORDER — LIDOCAINE HCL 1 % IJ SOLN
5.0000 mL | INTRAMUSCULAR | Status: AC | PRN
  Administered 2021-09-17: 5 mL

## 2021-09-19 MED ORDER — METHYLPREDNISOLONE ACETATE 40 MG/ML IJ SUSP
40.0000 mg | INTRAMUSCULAR | Status: AC | PRN
  Administered 2021-09-17: 40 mg via INTRA_ARTICULAR

## 2021-09-19 MED ORDER — BUPIVACAINE HCL 0.25 % IJ SOLN
4.0000 mL | INTRAMUSCULAR | Status: AC | PRN
  Administered 2021-09-17: 4 mL via INTRA_ARTICULAR

## 2021-09-19 NOTE — Progress Notes (Signed)
? ?  Procedure Note ? ?Patient: Brandon Clark             ?Date of Birth: 1941-04-25           ?MRN: [id number]             ?Visit Date: 09/17/2021 ? ?Procedures: ?Visit Diagnoses:  ?1. Primary osteoarthritis of both knees   ? ? ?Large Joint Inj: bilateral knee on 09/17/2021 1:50 PM ?Indications: diagnostic evaluation, joint swelling and pain ?Details: 18 G 1.5 in needle, superolateral approach ? ?Arthrogram: No ? ?Medications (Right): 5 mL lidocaine 1 %; 4 mL bupivacaine 0.25 %; 40 mg methylPREDNISolone acetate 40 MG/ML ?Aspirate (Right): 15 mL ?Medications (Left): 5 mL lidocaine 1 %; 4 mL bupivacaine 0.25 %; 40 mg methylPREDNISolone acetate 40 MG/ML ?Aspirate (Left): 35 mL ?Outcome: tolerated well, no immediate complications ?Procedure, treatment alternatives, risks and benefits explained, specific risks discussed. Consent was given by the patient. Immediately prior to procedure a time out was called to verify the correct patient, procedure, equipment, support staff and site/side marked as required. Patient was prepped and draped in the usual sterile fashion.  ? ? ? ? ? ?

## 2021-09-22 ENCOUNTER — Telehealth: Payer: Self-pay

## 2021-09-22 NOTE — Telephone Encounter (Signed)
Approved for SynviscOne, left knee. ?Buy & Bill ?Covered at 100% after Co-pay ?Co-pay of $4.00 ?No PA required per [place] with Medicaid. ?3/22/20232:56 ?

## 2021-10-26 ENCOUNTER — Ambulatory Visit: Payer: Medicaid Other | Admitting: Surgical

## 2021-11-05 ENCOUNTER — Other Ambulatory Visit: Payer: Self-pay

## 2021-11-05 ENCOUNTER — Ambulatory Visit (INDEPENDENT_AMBULATORY_CARE_PROVIDER_SITE_OTHER): Payer: Medicaid Other | Admitting: Surgical

## 2021-11-05 DIAGNOSIS — M545 Low back pain, unspecified: Secondary | ICD-10-CM

## 2021-11-05 DIAGNOSIS — M170 Bilateral primary osteoarthritis of knee: Secondary | ICD-10-CM

## 2021-11-07 ENCOUNTER — Encounter: Payer: Self-pay | Admitting: Surgical

## 2021-11-07 MED ORDER — LIDOCAINE HCL 1 % IJ SOLN
5.0000 mL | INTRAMUSCULAR | Status: AC | PRN
  Administered 2021-11-05: 5 mL

## 2021-11-07 NOTE — Progress Notes (Addendum)
? ?  Procedure Note ? ?Patient: Brandon Clark             ?Date of Birth: 26-Jan-1941           ?MRN: [id number]             ?Visit Date: 11/05/2021 ? ?Procedures: ?Visit Diagnoses: No diagnosis found. ? ?Large Joint Inj: bilateral knee on 11/05/2021 11:09 AM ?Indications: diagnostic evaluation, joint swelling and pain ?Details: 18 G 1.5 in needle, superolateral approach ? ?Arthrogram: No ? ?Medications (Right): 5 mL lidocaine 1 % ?Aspirate (Right): 20 mL ?Medications (Left): 5 mL lidocaine 1 % ?Aspirate (Left): 65 mL ?Outcome: tolerated well, no immediate complications ? ?Plan to return June 15 for bilateral knee injections. ?Procedure, treatment alternatives, risks and benefits explained, specific risks discussed. Consent was given by the patient. Immediately prior to procedure a time out was called to verify the correct patient, procedure, equipment, support staff and site/side marked as required. Patient was prepped and draped in the usual sterile fashion.  ? ? ? ? ? ?

## 2021-12-01 ENCOUNTER — Encounter: Payer: Self-pay | Admitting: Physical Medicine and Rehabilitation

## 2021-12-01 ENCOUNTER — Ambulatory Visit (INDEPENDENT_AMBULATORY_CARE_PROVIDER_SITE_OTHER): Payer: Medicaid Other | Admitting: Physical Medicine and Rehabilitation

## 2021-12-01 DIAGNOSIS — M545 Low back pain, unspecified: Secondary | ICD-10-CM | POA: Diagnosis not present

## 2021-12-01 DIAGNOSIS — M47816 Spondylosis without myelopathy or radiculopathy, lumbar region: Secondary | ICD-10-CM | POA: Diagnosis not present

## 2021-12-01 DIAGNOSIS — G8929 Other chronic pain: Secondary | ICD-10-CM | POA: Diagnosis not present

## 2021-12-01 NOTE — Progress Notes (Signed)
Brandon Clark - 81 y.o. male MRN [id number]  Date of birth: 02/11/1941  Office Visit Note: Visit Date: 12/01/2021 PCP: Jolinda Croak, MD Referred by: Jolinda Croak, MD  Subjective: Chief Complaint  Patient presents with   Lower Back - Pain   HPI: Brandon Clark is a 81 y.o. male who comes in today at the request of Gloriann Loan, [place] for evaluation of chronic, worsening and severe bilateral lower back pain. Interpreter present during our visit today, patient is somewhat of poor historian. He reports pain has been ongoing intermittently for 15 or more years. States his pain worsens when moving from sitting to standing position, describes his pain as a constant sore and aching sensation, currently rates as 8 out of 10. Patient reports some relief of pain with home exercise regimen, use of heating pad, rest and over the counter medications. Patient states he feels like no treatments significantly help to alleviate his pain. Patients lumbar MRI from 2022 exhibits degenerative changes at L5-S1, more pronounced at the level of the right facet joint where there are prominent hypertrophic changes. No high grade spinal canal stenosis noted. Patient currently being managed by Gloriann Loan, PA for chronic bilateral knee osteoarthritis, recent bilateral knee injections on 11/05/2021 and reports good relief of pain with this procedure. Patient denies focal weakness, numbness and tingling. Patient denies recent trauma or falls.   Review of Systems  Musculoskeletal:  Positive for back pain.  Neurological:  Negative for tingling, sensory change, focal weakness and weakness.  All other systems reviewed and are negative. Otherwise per HPI.  Assessment & Plan: Visit Diagnoses:    ICD-10-CM   1. Chronic bilateral low back pain without sciatica  M54.50 Ambulatory referral to Physical Medicine Rehab   G89.29     2. Spondylosis without myelopathy or radiculopathy, lumbar region   M47.816 Ambulatory referral to Physical Medicine Rehab    3. Facet hypertrophy of lumbar region  M47.816 Ambulatory referral to Physical Medicine Rehab       Plan: Findings:  Chronic, worsening and severe bilateral lower back pain. No radicular symptoms noted at this time. Patient continues to have severe pain despite good conservative therapies such as home exercise, use of heating pad, rest and over the counter medications. Patients clinical presentation and exam are consistent with facet mediated pain. Severe pain with lumbar extension upon exam today. We believe the next step is to perform diagnostic and hopefully therapeutic bilateral L5-S1 facet joint/medial branch blocks under fluoroscopic guidance. If patient gets significant relief of pain with diagnostic facet blocks we did discuss the possibility of longer sustained pain relief with radiofrequency ablation procedure. Lumbar injection procedure discussed in detail including risks, he has no further questions at this time. Patient encouraged to remain active and to continue home exercise regimen as tolerated. No red flag symptoms noted upon exam today.    Meds & Orders: No orders of the defined types were placed in this encounter.   Orders Placed This Encounter  Procedures   Ambulatory referral to Physical Medicine Rehab    Follow-up: Return for BIlateral L5-S1 facet joint/medial branch blocks .   Procedures: No procedures performed      Clinical History: MRI LUMBAR SPINE WITHOUT CONTRAST   TECHNIQUE: Multiplanar, multisequence MR imaging of the lumbar spine was performed. No intravenous contrast was administered.   COMPARISON:  Radiographs March 31, 2021.   FINDINGS: Segmentation:  Standard.   Alignment:  Physiologic.   Vertebrae: No fracture, evidence  of discitis, or bone lesion. Endplate degenerative changes at L2-3 and L4-5.   Conus medullaris and cauda equina: Conus extends to the L1-2 level. Conus and cauda  equina appear normal.   Paraspinal and other soft tissues: Negative.   Disc levels:   T12-L1: No spinal canal or neural foraminal stenosis.   L1-2: Disc bulge and mild facet degenerative changes resulting in mild bilateral neural foraminal narrowing. No significant spinal canal stenosis.   L2-3: Disc bulge and mild facet degenerative changes resulting in mild bilateral neural foraminal narrowing. No significant spinal canal stenosis.   L3-4 disc bulge and mild facet degenerative changes resulting mild bilateral neural foraminal narrowing. No significant spinal canal stenosis.   L4-5: Disc bulge with superimposed right central to foraminal disc protrusion and mild facet degenerative change resulting in mild narrowing of the right subarticular zone, moderate right and mild left neural foraminal narrowing. No significant spinal canal stenosis.   L5-S1: Disc bulge, prominent hypertrophic right facet degenerative changes and moderate left facet degenerative changes. Findings result in severe narrowing of the right subarticular zone and right neural foraminal narrowing. Mild left neural foraminal narrowing. No significant spinal canal stenosis.   IMPRESSION: 1. Degenerative changes at L5-S1, more pronounced at the level of the right facet joint where there are prominent hypertrophic changes. Findings result in severe narrowing of the right subarticular zone and severe right neural foraminal narrowing at this level. 2. Moderate right and mild left neural foraminal narrowing at L4-5. 3. Mild bilateral neural foraminal narrowing at L1-2, L2-3 and L3-4. 4. No high-grade spinal canal stenosis.     Electronically Signed   By: Pedro Earls M.D.   On: 05/11/2021 17:45   He reports that he has never smoked. He has never used smokeless tobacco. No results for input(s): HGBA1C, LABURIC in the last 8760 hours.  Objective:  VS:  HT:    WT:   BMI:     BP:   HR: bpm   TEMP: ( )  RESP:  Physical Exam Vitals and nursing note reviewed.  HENT:     Head: Normocephalic and atraumatic.     Right Ear: External ear normal.     Left Ear: External ear normal.     Nose: Nose normal.     Mouth/Throat:     Mouth: Mucous membranes are moist.  Eyes:     Extraocular Movements: Extraocular movements intact.  Cardiovascular:     Rate and Rhythm: Normal rate.     Pulses: Normal pulses.  Pulmonary:     Effort: Pulmonary effort is normal.  Abdominal:     General: Abdomen is flat. There is no distension.  Musculoskeletal:        General: Tenderness present.     Cervical back: Normal range of motion.     Comments: Pt rises from seated position to standing without difficulty. Concordant low back pain with facet loading, lumbar spine extension and rotation. Strong distal strength without clonus, no pain upon palpation of greater trochanters. Sensation intact bilaterally. Ambulates with cane, gait slow and unsteady.   Skin:    General: Skin is warm and dry.     Capillary Refill: Capillary refill takes less than 2 seconds.  Neurological:     Mental Status: He is alert and oriented to person, place, and time.     Gait: Gait abnormal.  Psychiatric:        Mood and Affect: Mood normal.        Behavior:  Behavior normal.    Ortho Exam  Imaging: No results found.  Past Medical/Family/Surgical/Social History: Medications & Allergies reviewed per EMR, new medications updated. Patient Active Problem List   Diagnosis Date Noted   Laceration of flexor muscle, fascia and tendon of left thumb at forearm level, sequela 03/16/2021   S/P Nissen fundoplication (without gastrostomy tube) procedure 06/02/2017   Dementia ([place]) 05/12/2017   Prediabetes 01/30/2017   Hiatal hernia 01/27/2017   Acid reflux 02/12/2016   Sleep apnea 02/12/2016   Diastasis recti [id number]   Umbilical hernia [id number]   BPV (benign positional vertigo) 07/01/2014   HTN (hypertension) 07/01/2014    Panic attack    Encephalopathy, hypertensive    Ataxia 06/30/2014   Vertigo 06/30/2014   Dyspepsia 04/03/2014   Encounter for colorectal cancer screening 04/03/2014   Bilateral shoulder pain 09/17/2013   Bilateral knee pain 02/05/2013   Past Medical History:  Diagnosis Date   Acid reflux    BPH (benign prostatic hyperplasia)    Hiatal hernia    Hyperlipidemia    Hypertension    Pre-diabetes    Sleep apnea    sometimes does not use d/t poor fitting of the mask   Ulcer    Family History  Problem Relation Age of Onset   Diabetes Mother    Diabetes Brother    Heart attack Neg Hx    Hyperlipidemia Neg Hx    Hypertension Neg Hx    Sudden death Neg Hx    Past Surgical History:  Procedure Laterality Date   ESOPHAGEAL MANOMETRY N/A 04/27/2016   Procedure: ESOPHAGEAL MANOMETRY (EM) with [place];  Surgeon: Lucilla Lame, MD;  Location: [facility];  Service: Endoscopy;  Laterality: N/A;   ESOPHAGEAL MANOMETRY N/A 04/19/2017   Procedure: ESOPHAGEAL MANOMETRY (EM);  Surgeon: Wilford Corner, MD;  Location: [facility];  Service: Endoscopy;  Laterality: N/A;   FACIAL LACERATIONS REPAIR     INSERTION OF MESH N/A 06/02/2017   Procedure: INSERTION OF MESH;  Surgeon: Ralene Ok, MD;  Location: [facility];  Service: General;  Laterality: N/A;   Social History   Occupational History   Occupation: Chief Financial Officer  Tobacco Use   Smoking status: Never   Smokeless tobacco: Never  Vaping Use   Vaping Use: Never used  Substance and Sexual Activity   Alcohol use: No    Alcohol/week: 0.0 standard drinks   Drug use: No   Sexual activity: Never

## 2021-12-16 ENCOUNTER — Encounter: Payer: Self-pay | Admitting: Surgical

## 2021-12-16 ENCOUNTER — Ambulatory Visit (INDEPENDENT_AMBULATORY_CARE_PROVIDER_SITE_OTHER): Payer: Medicaid Other | Admitting: Surgical

## 2021-12-16 DIAGNOSIS — M170 Bilateral primary osteoarthritis of knee: Secondary | ICD-10-CM | POA: Diagnosis not present

## 2021-12-16 MED ORDER — BUPIVACAINE HCL 0.25 % IJ SOLN
4.0000 mL | INTRAMUSCULAR | Status: AC | PRN
  Administered 2021-12-16: 4 mL via INTRA_ARTICULAR

## 2021-12-16 MED ORDER — METHYLPREDNISOLONE ACETATE 40 MG/ML IJ SUSP
40.0000 mg | INTRAMUSCULAR | Status: AC | PRN
  Administered 2021-12-16: 40 mg via INTRA_ARTICULAR

## 2021-12-16 MED ORDER — LIDOCAINE HCL 1 % IJ SOLN
5.0000 mL | INTRAMUSCULAR | Status: AC | PRN
  Administered 2021-12-16: 5 mL

## 2021-12-16 NOTE — Progress Notes (Signed)
   Procedure Note  Patient: KARMAN VENEY             Date of Birth: [birth year]           MRN: [id number]             Visit Date: 12/16/2021  Procedures: Visit Diagnoses:  1. Primary osteoarthritis of both knees     Large Joint Inj: bilateral knee on 12/16/2021 3:42 PM Indications: diagnostic evaluation, joint swelling and pain Details: 18 G 1.5 in needle, superolateral approach  Arthrogram: No  Medications (Right): 5 mL lidocaine 1 %; 4 mL bupivacaine 0.25 %; 40 mg methylPREDNISolone acetate 40 MG/ML Medications (Left): 5 mL lidocaine 1 %; 4 mL bupivacaine 0.25 %; 40 mg methylPREDNISolone acetate 40 MG/ML Aspirate (Left): 70 mL Outcome: tolerated well, no immediate complications Procedure, treatment alternatives, risks and benefits explained, specific risks discussed. Consent was given by the patient. Immediately prior to procedure a time out was called to verify the correct patient, procedure, equipment, support staff and site/side marked as required. Patient was prepped and draped in the usual sterile fashion.

## 2022-01-10 ENCOUNTER — Encounter (HOSPITAL_COMMUNITY): Payer: Self-pay

## 2022-01-10 ENCOUNTER — Ambulatory Visit (HOSPITAL_COMMUNITY)
Admission: EM | Admit: 2022-01-10 | Discharge: 2022-01-10 | Disposition: A | Discharge status: Home / Self Care | Payer: Medicaid Other | Attending: Physician Assistant | Admitting: Physician Assistant

## 2022-01-10 DIAGNOSIS — R051 Acute cough: Secondary | ICD-10-CM | POA: Diagnosis not present

## 2022-01-10 DIAGNOSIS — J069 Acute upper respiratory infection, unspecified: Secondary | ICD-10-CM | POA: Diagnosis not present

## 2022-01-10 DIAGNOSIS — B349 Viral infection, unspecified: Secondary | ICD-10-CM | POA: Diagnosis not present

## 2022-01-10 DIAGNOSIS — J029 Acute pharyngitis, unspecified: Secondary | ICD-10-CM | POA: Diagnosis not present

## 2022-01-10 DIAGNOSIS — Z20822 Contact with and (suspected) exposure to covid-19: Secondary | ICD-10-CM | POA: Insufficient documentation

## 2022-01-10 DIAGNOSIS — R197 Diarrhea, unspecified: Secondary | ICD-10-CM

## 2022-01-10 MED ORDER — BENZONATATE 200 MG PO CAPS
200.0000 mg | ORAL_CAPSULE | Freq: Three times a day (TID) | ORAL | 0 refills | Status: DC | PRN
Start: 2022-01-10 — End: 2024-05-03

## 2022-01-10 MED ORDER — FLUTICASONE PROPIONATE 50 MCG/ACT NA SUSP
2.0000 | Freq: Every day | NASAL | 0 refills | Status: DC
Start: 2022-01-10 — End: 2024-05-03

## 2022-01-10 NOTE — ED Provider Notes (Signed)
[facility]    CSN: [id number] Arrival date & time: 01/10/22  1451      History   Chief Complaint Chief Complaint  Patient presents with   Diarrhea    HPI Brandon Clark is a 81 y.o. male.   Patient presents today with a 3-day history of URI symptoms.  He reports diarrhea with associated abdominal bloating.  He reports cough, increased mucus production, sinusitis, sore throat.  Denies any chest pain, shortness of breath, nausea, vomiting, fever.  Denies any known sick contacts.  He has not had COVID in the past.  He has not had COVID-vaccine.  He has been using DayQuil/NyQuil over-the-counter with temporary improvement of symptoms.  Denies history of allergies, asthma, COPD.  Denies any recent antibiotic or steroid use.  He does not smoke.    Past Medical History:  Diagnosis Date   Acid reflux    BPH (benign prostatic hyperplasia)    Hiatal hernia    Hyperlipidemia    Hypertension    Pre-diabetes    Sleep apnea    sometimes does not use d/t poor fitting of the mask   Ulcer     Patient Active Problem List   Diagnosis Date Noted   Laceration of flexor muscle, fascia and tendon of left thumb at forearm level, sequela 03/16/2021   S/P Nissen fundoplication (without gastrostomy tube) procedure 06/02/2017   Dementia ([facility]) 05/12/2017   Prediabetes 01/30/2017   Hiatal hernia 01/27/2017   Acid reflux 02/12/2016   Sleep apnea 02/12/2016   Diastasis recti 03/02/2015   Umbilical hernia 09/16/2014   BPV (benign positional vertigo) 07/01/2014   HTN (hypertension) 07/01/2014   Panic attack    Encephalopathy, hypertensive    Ataxia 06/30/2014   Vertigo 06/30/2014   Dyspepsia 04/03/2014   Encounter for colorectal cancer screening 04/03/2014   Bilateral shoulder pain 09/17/2013   Bilateral knee pain 02/05/2013    Past Surgical History:  Procedure Laterality Date   ESOPHAGEAL MANOMETRY N/A 04/27/2016   Procedure: ESOPHAGEAL MANOMETRY (EM) with PH;   Surgeon: Midge Minium, MD;  Location: [facility];  Service: Endoscopy;  Laterality: N/A;   ESOPHAGEAL MANOMETRY N/A 04/19/2017   Procedure: ESOPHAGEAL MANOMETRY (EM);  Surgeon: Charlott Rakes, MD;  Location: [facility];  Service: Endoscopy;  Laterality: N/A;   FACIAL LACERATIONS REPAIR     INSERTION OF MESH N/A 06/02/2017   Procedure: INSERTION OF MESH;  Surgeon: Axel Filler, MD;  Location: [facility];  Service: General;  Laterality: N/A;       Home Medications    Prior to Admission medications   Medication Sig Start Date End Date Taking? Authorizing Provider  acetaminophen (TYLENOL) 500 MG tablet Take 500-1,000 mg by mouth every 6 (six) hours as needed.    [provider]  albuterol (VENTOLIN HFA) 108 (90 Base) MCG/ACT inhaler Inhale 1-2 puffs into the lungs every 4 (four) hours as needed for wheezing or shortness of breath. 06/09/21   Domenick Gong, MD  aspirin EC 325 MG tablet Take 1 tablet (325 mg total) by mouth daily. Patient not taking: Reported on 10/16/2019 06/09/17   Everlene Farrier, PA-C  benzonatate (TESSALON) 200 MG capsule Take 1 capsule (200 mg total) by mouth 3 (three) times daily as needed for cough. 01/10/22   Kendrix Orman K, PA-C  donepezil (ARICEPT) 10 MG tablet Take 1 tablet (10 mg total) by mouth at bedtime. Patient not taking: Reported on 10/16/2019 06/12/18   Hoy Register, MD  finasteride (PROSCAR) 5 MG tablet  Take 1 tablet (5 mg total) by mouth daily. Patient not taking: Reported on 06/09/2021 10/16/19   Charlott Rakes, MD  fluticasone (FLONASE) 50 MCG/ACT nasal spray Place 2 sprays into both nostrils daily. 01/10/22   Krithi Bray, Derry Skill, PA-C  hydrochlorothiazide (HYDRODIURIL) 12.5 MG tablet Take 1 tablet (12.5 mg total) by mouth daily. Patient not taking: Reported on 10/16/2019 06/12/18   Charlott Rakes, MD  methocarbamol (ROBAXIN) 500 MG tablet Take 500 mg by mouth every 8 (eight) hours as needed for muscle spasms. 05/13/16   [provider]  simvastatin (ZOCOR) 10 MG tablet Take 1 tablet (10 mg total) by mouth at bedtime. Patient not taking: Reported on 06/09/2021 10/16/19   Charlott Rakes, MD  Spacer/Aero-Holding Chambers (AEROCHAMBER PLUS) inhaler Use with inhaler 06/09/21   Melynda Ripple, MD  tamsulosin (FLOMAX) 0.4 MG CAPS capsule Take 1 capsule (0.4 mg total) by mouth daily. 10/16/19   Charlott Rakes, MD  traZODone (DESYREL) 50 MG tablet Take 1 tablet (50 mg total) by mouth at bedtime as needed for sleep. 10/16/19   Charlott Rakes, MD    Family History Family History  Problem Relation Age of Onset   Diabetes Mother    Diabetes Brother    Heart attack Neg Hx    Hyperlipidemia Neg Hx    Hypertension Neg Hx    Sudden death Neg Hx     Social History Social History   Tobacco Use   Smoking status: Never   Smokeless tobacco: Never  Vaping Use   Vaping Use: Never used  Substance Use Topics   Alcohol use: No    Alcohol/week: 0.0 standard drinks of alcohol   Drug use: No     Allergies   Ondansetron   Review of Systems Review of Systems  Constitutional:  Positive for activity change. Negative for appetite change, fatigue and fever.  HENT:  Positive for congestion, sinus pressure and sore throat. Negative for sneezing.   Respiratory:  Positive for cough. Negative for shortness of breath.   Cardiovascular:  Negative for chest pain.  Gastrointestinal:  Positive for abdominal pain and diarrhea. Negative for nausea and vomiting.  Neurological:  Negative for dizziness, light-headedness and headaches.     Physical Exam Triage Vital Signs ED Triage Vitals  Enc Vitals Group     BP 01/10/22 1639 126/87     Pulse Rate 01/10/22 1639 82     Resp 01/10/22 1639 14     Temp 01/10/22 1639 98.2 F (36.8 C)     Temp Source 01/10/22 1639 Oral     SpO2 01/10/22 1639 97 %     Weight --      Height --      Head Circumference --      Peak Flow --      Pain Score 01/10/22 1644 0     Pain Loc --      Pain Edu? --       Excl. in [place]? --    No data found.  Updated Vital Signs BP 126/87 (BP Location: Right Arm)   Pulse 82   Temp 98.2 F (36.8 C) (Oral)   Resp 14   SpO2 97%   Visual Acuity Right Eye Distance:   Left Eye Distance:   Bilateral Distance:    Right Eye Near:   Left Eye Near:    Bilateral Near:     Physical Exam Vitals reviewed.  Constitutional:      General: He is awake.  Appearance: Normal appearance. He is well-developed. He is not ill-appearing.     Comments: Very pleasant male appears stated age in no acute distress  HENT:     Head: Normocephalic and atraumatic.     Right Ear: Tympanic membrane, ear canal and external ear normal. Tympanic membrane is not erythematous or bulging.     Left Ear: Tympanic membrane, ear canal and external ear normal. Tympanic membrane is not erythematous or bulging.     Nose: Nose normal.     Mouth/Throat:     Pharynx: Uvula midline. Posterior oropharyngeal erythema present. No oropharyngeal exudate or uvula swelling.  Cardiovascular:     Rate and Rhythm: Normal rate and regular rhythm.     Heart sounds: Normal heart sounds, S1 normal and S2 normal. No murmur heard. Pulmonary:     Effort: Pulmonary effort is normal. No accessory muscle usage or respiratory distress.     Breath sounds: Normal breath sounds. No stridor. No wheezing, rhonchi or rales.     Comments: Clear to auscultation bilaterally Abdominal:     General: Bowel sounds are normal.     Palpations: Abdomen is soft.     Tenderness: There is no abdominal tenderness. There is no right CVA tenderness, left CVA tenderness, guarding or rebound.     Hernia: A hernia is present. Hernia is present in the umbilical area.  Musculoskeletal:     Cervical back: Normal range of motion and neck supple.  Neurological:     Mental Status: He is alert.  Psychiatric:        Behavior: Behavior is cooperative.      UC Treatments / Results  Labs (all labs ordered are listed, but only  abnormal results are displayed) Labs Reviewed  SARS CORONAVIRUS 2 (TAT 6-24 HRS)    EKG   Radiology No results found.  Procedures Procedures (including critical care time)  Medications Ordered in UC Medications - No data to display  Initial Impression / Assessment and Plan / UC Course  I have reviewed the triage vital signs and the nursing notes.  Pertinent labs & imaging results that were available during my care of the patient were reviewed by me and considered in my medical decision making (see chart for details).     Patient is well-appearing, afebrile, nontoxic, nontachycardic.  Vital signs physical exam reassuring today; no indication for emergent evaluation or imaging.  Suspect viral illness as etiology of symptoms given clinical presentation.  We will test for COVID-19.  He was instructed to remain in isolation until he receives results.  Recommended over-the-counter medications as needed for symptom relief.  He was prescribed Tessalon for cough.  Recommended eating a bland diet and drinking plenty of fluid.  Discussed that if symptoms are not improving within a few days he is to return for reevaluation.  Discussed alarm symptoms that warrant emergent evaluation including chest pain, shortness of breath, nausea/vomiting interfering with oral intake, severe abdominal pain, fever not responding to medication.  Strict return precautions given.    Final Clinical Impressions(s) / UC Diagnoses   Final diagnoses:  Viral illness  Upper respiratory tract infection, unspecified type  Acute cough  Diarrhea, unspecified type  Sore throat     Discharge Instructions      I believe that you have a virus.  We will contact you if your COVID test is positive.  Use Tessalon for cough.  You can use over-the-counter medication including Mucinex, Flonase, Tylenol for additional symptom relief.  Use Flonase  for congestion.  Make sure you rest and drink plenty fluid.  Eat a bland diet.  If  your symptoms are not improving within a few days you need to return for reevaluation.  If anything worsens and you develop high fever, chest pain, shortness of breath, nausea/vomiting interfering with oral intake, weakness, severe abdominal pain you need to go to the emergency room immediately.     ED Prescriptions     Medication Sig Dispense Auth. Provider   benzonatate (TESSALON) 200 MG capsule Take 1 capsule (200 mg total) by mouth 3 (three) times daily as needed for cough. 30 capsule Champayne Kocian K, PA-C   fluticasone (FLONASE) 50 MCG/ACT nasal spray Place 2 sprays into both nostrils daily. 16 g Chailyn Racette K, PA-C      PDMP not reviewed this encounter.   Terrilee Croak, PA-C 01/10/22 1712

## 2022-01-10 NOTE — Discharge Instructions (Signed)
I believe that you have a virus.  We will contact you if your COVID test is positive.  Use Tessalon for cough.  You can use over-the-counter medication including Mucinex, Flonase, Tylenol for additional symptom relief.  Use Flonase for congestion.  Make sure you rest and drink plenty fluid.  Eat a bland diet.  If your symptoms are not improving within a few days you need to return for reevaluation.  If anything worsens and you develop high fever, chest pain, shortness of breath, nausea/vomiting interfering with oral intake, weakness, severe abdominal pain you need to go to the emergency room immediately.

## 2022-01-10 NOTE — ED Triage Notes (Signed)
Pt presents with diarrhea and bloating x 3 days

## 2022-01-11 LAB — SARS CORONAVIRUS 2 (TAT 6-24 HRS): SARS Coronavirus 2: NEGATIVE

## 2022-01-17 ENCOUNTER — Ambulatory Visit: Payer: Self-pay

## 2022-01-17 ENCOUNTER — Encounter: Payer: Self-pay | Admitting: Physical Medicine and Rehabilitation

## 2022-01-17 ENCOUNTER — Ambulatory Visit (INDEPENDENT_AMBULATORY_CARE_PROVIDER_SITE_OTHER): Payer: Medicaid Other | Admitting: Physical Medicine and Rehabilitation

## 2022-01-17 DIAGNOSIS — M47816 Spondylosis without myelopathy or radiculopathy, lumbar region: Secondary | ICD-10-CM

## 2022-01-17 MED ORDER — BUPIVACAINE HCL 0.5 % IJ SOLN
3.0000 mL | Freq: Once | INTRAMUSCULAR | Status: AC
  Administered 2022-01-17: 3 mL

## 2022-01-17 NOTE — Progress Notes (Signed)
Pt state lower back pain that travels down his both legs. Pt state movement makes the pain worse. Pt state he takes over the counter pain meds to help ease his pain.  Numeric Pain Rating Scale and Functional Assessment Average Pain 7   In the last MONTH (on 0-10 scale) has pain interfered with the following?  1. General activity like being  able to carry out your everyday physical activities such as walking, climbing stairs, carrying groceries, or moving a chair?  Rating(9)   +Driver, -BT, -Dye Allergies.

## 2022-01-17 NOTE — Patient Instructions (Signed)

## 2022-01-18 ENCOUNTER — Ambulatory Visit (HOSPITAL_COMMUNITY)
Admission: EM | Admit: 2022-01-18 | Discharge: 2022-01-18 | Disposition: A | Discharge status: Home / Self Care | Payer: Medicare Other | Attending: Emergency Medicine | Admitting: Emergency Medicine

## 2022-01-18 ENCOUNTER — Encounter (HOSPITAL_COMMUNITY): Payer: Self-pay

## 2022-01-18 DIAGNOSIS — R197 Diarrhea, unspecified: Secondary | ICD-10-CM

## 2022-01-18 DIAGNOSIS — R399 Unspecified symptoms and signs involving the genitourinary system: Secondary | ICD-10-CM

## 2022-01-18 MED ORDER — DICYCLOMINE HCL 20 MG PO TABS: 20.0000 mg | ORAL_TABLET | Freq: Two times a day (BID) | ORAL | 0 refills | Status: DC

## 2022-01-18 MED ORDER — TAMSULOSIN HCL 0.4 MG PO CAPS: 0.4000 mg | ORAL_CAPSULE | Freq: Every day | ORAL | 1 refills | Status: DC

## 2022-01-18 MED ORDER — AZITHROMYCIN 250 MG PO TABS: 250.0000 mg | ORAL_TABLET | Freq: Every day | ORAL | 0 refills | Status: DC

## 2022-01-18 NOTE — ED Provider Notes (Signed)
[facility]    CSN: [id number] Arrival date & time: 01/18/22  1630      History   Chief Complaint Chief Complaint  Patient presents with   Diarrhea    HPI Brandon Clark is a 81 y.o. male.   Patient presents with persistent diarrhea for 7 days.  Endorses it has been occurring 7-8 times daily, described as watery with mucus.  Associated rumbling to the abdomen, abdominal bloating and increased gas production with a foul smell.  Was evaluated on 01/10/2022 in the urgent care for similar symptoms as well as cold symptoms, diagnosed with a viral URI.  Has not attempted treatment of above symptoms.  Denies hematochezia, nausea, vomiting, fevers  .   Patient concerned with increased urinary frequency since stopping prostate medication.  Endorses history of dementia and is unsure the last time that he took this tablet..   Requesting note for work saying COVID testing was negative.  Past Medical History:  Diagnosis Date   Acid reflux    BPH (benign prostatic hyperplasia)    Hiatal hernia    Hyperlipidemia    Hypertension    Pre-diabetes    Sleep apnea    sometimes does not use d/t poor fitting of the mask   Ulcer     Patient Active Problem List   Diagnosis Date Noted   Laceration of flexor muscle, fascia and tendon of left thumb at forearm level, sequela 03/16/2021   S/P Nissen fundoplication (without gastrostomy tube) procedure 06/02/2017   Dementia (HCC) 05/12/2017   Prediabetes 01/30/2017   Hiatal hernia 01/27/2017   Acid reflux 02/12/2016   Sleep apnea 02/12/2016   Diastasis recti 03/02/2015   Umbilical hernia 09/16/2014   BPV (benign positional vertigo) 07/01/2014   HTN (hypertension) 07/01/2014   Panic attack    Encephalopathy, hypertensive    Ataxia 06/30/2014   Vertigo 06/30/2014   Dyspepsia 04/03/2014   Encounter for colorectal cancer screening 04/03/2014   Bilateral shoulder pain 09/17/2013   Bilateral knee pain 02/05/2013    Past  Surgical History:  Procedure Laterality Date   ESOPHAGEAL MANOMETRY N/A 04/27/2016   Procedure: ESOPHAGEAL MANOMETRY (EM) with PH;  Surgeon: Midge Minium, MD;  Location: [facility];  Service: Endoscopy;  Laterality: N/A;   ESOPHAGEAL MANOMETRY N/A 04/19/2017   Procedure: ESOPHAGEAL MANOMETRY (EM);  Surgeon: Charlott Rakes, MD;  Location: [facility];  Service: Endoscopy;  Laterality: N/A;   FACIAL LACERATIONS REPAIR     INSERTION OF MESH N/A 06/02/2017   Procedure: INSERTION OF MESH;  Surgeon: Axel Filler, MD;  Location: [facility];  Service: General;  Laterality: N/A;       Home Medications    Prior to Admission medications   Medication Sig Start Date End Date Taking? Authorizing Provider  acetaminophen (TYLENOL) 500 MG tablet Take 500-1,000 mg by mouth every 6 (six) hours as needed.    [provider]  albuterol (VENTOLIN HFA) 108 (90 Base) MCG/ACT inhaler Inhale 1-2 puffs into the lungs every 4 (four) hours as needed for wheezing or shortness of breath. 06/09/21   Domenick Gong, MD  aspirin EC 325 MG tablet Take 1 tablet (325 mg total) by mouth daily. Patient not taking: Reported on 10/16/2019 06/09/17   Everlene Farrier, PA-C  benzonatate (TESSALON) 200 MG capsule Take 1 capsule (200 mg total) by mouth 3 (three) times daily as needed for cough. 01/10/22   Raspet, Erin K, PA-C  donepezil (ARICEPT) 10 MG tablet Take 1 tablet (10 mg total) by  mouth at bedtime. Patient not taking: Reported on 10/16/2019 06/12/18   Hoy Register, MD  finasteride (PROSCAR) 5 MG tablet Take 1 tablet (5 mg total) by mouth daily. Patient not taking: Reported on 06/09/2021 10/16/19   Hoy Register, MD  fluticasone (FLONASE) 50 MCG/ACT nasal spray Place 2 sprays into both nostrils daily. 01/10/22   Raspet, Noberto Retort, PA-C  hydrochlorothiazide (HYDRODIURIL) 12.5 MG tablet Take 1 tablet (12.5 mg total) by mouth daily. Patient not taking: Reported on 10/16/2019 06/12/18   Hoy Register, MD   methocarbamol (ROBAXIN) 500 MG tablet Take 500 mg by mouth every 8 (eight) hours as needed for muscle spasms. 05/13/16   [provider]  simvastatin (ZOCOR) 10 MG tablet Take 1 tablet (10 mg total) by mouth at bedtime. Patient not taking: Reported on 06/09/2021 10/16/19   Hoy Register, MD  Spacer/Aero-Holding Chambers (AEROCHAMBER PLUS) inhaler Use with inhaler 06/09/21   Domenick Gong, MD  tamsulosin (FLOMAX) 0.4 MG CAPS capsule Take 1 capsule (0.4 mg total) by mouth daily. 10/16/19   Hoy Register, MD  traZODone (DESYREL) 50 MG tablet Take 1 tablet (50 mg total) by mouth at bedtime as needed for sleep. 10/16/19   Hoy Register, MD    Family History Family History  Problem Relation Age of Onset   Diabetes Mother    Diabetes Brother    Heart attack Neg Hx    Hyperlipidemia Neg Hx    Hypertension Neg Hx    Sudden death Neg Hx     Social History Social History   Tobacco Use   Smoking status: Never   Smokeless tobacco: Never  Vaping Use   Vaping Use: Never used  Substance Use Topics   Alcohol use: No    Alcohol/week: 0.0 standard drinks of alcohol   Drug use: No     Allergies   Ondansetron   Review of Systems Review of Systems  Constitutional: Negative.   HENT: Negative.    Respiratory: Negative.    Cardiovascular: Negative.   Gastrointestinal:  Positive for abdominal distention, abdominal pain and diarrhea. Negative for anal bleeding, blood in stool, constipation, nausea, rectal pain and vomiting.  Skin: Negative.      Physical Exam Triage Vital Signs ED Triage Vitals  Enc Vitals Group     BP 01/18/22 1723 (!) 154/94     Pulse Rate 01/18/22 1723 81     Resp 01/18/22 1723 18     Temp 01/18/22 1723 98.3 F (36.8 C)     Temp Source 01/18/22 1723 Oral     SpO2 01/18/22 1723 94 %     Weight --      Height --      Head Circumference --      Peak Flow --      Pain Score 01/18/22 1728 0     Pain Loc --      Pain Edu? --      Excl. in GC? --     No data found.  Updated Vital Signs BP (!) 154/94 (BP Location: Right Arm)   Pulse 81   Temp 98.3 F (36.8 C) (Oral)   Resp 18   SpO2 94%   Visual Acuity Right Eye Distance:   Left Eye Distance:   Bilateral Distance:    Right Eye Near:   Left Eye Near:    Bilateral Near:     Physical Exam Constitutional:      Appearance: Normal appearance.  HENT:     Head: Normocephalic.  Eyes:     Extraocular Movements: Extraocular movements intact.  Pulmonary:     Effort: Pulmonary effort is normal.  Abdominal:     General: Abdomen is flat. Bowel sounds are increased.     Palpations: Abdomen is soft.     Tenderness: There is abdominal tenderness in the right lower quadrant and left lower quadrant. There is no right CVA tenderness or guarding. Negative signs include Murphy's sign and McBurney's sign.     Hernia: A hernia is present. Hernia is present in the umbilical area.  Skin:    General: Skin is warm and dry.  Neurological:     Mental Status: He is alert and oriented to person, place, and time. Mental status is at baseline.  Psychiatric:        Mood and Affect: Mood normal.        Behavior: Behavior normal.      UC Treatments / Results  Labs (all labs ordered are listed, but only abnormal results are displayed) Labs Reviewed - No data to display  EKG   Radiology XR C-ARM NO REPORT  Result Date: 01/17/2022 Please see Notes tab for imaging impression.   Procedures Procedures (including critical care time)  Medications Ordered in UC Medications - No data to display  Initial Impression / Assessment and Plan / UC Course  I have reviewed the triage vital signs and the nursing notes.  Pertinent labs & imaging results that were available during my care of the patient were reviewed by me and considered in my medical decision making (see chart for details).  Diarrhea  Vital signs are stable patient is in no signs of distress, tenderness on exam is mild and an  umbilical hernia without signs of incarceration is present, increased bowel sounds primarily over the left lower quadrant in the epigastric region, low suspicion for an acute abdomen due to timeline of illness, possible bacterial present due to recent viral URI, discussed with patient, prescribed azithromycin and dicyclomine for management, may also use over-the-counter Imodium for symptomatic relief, advised increase fluid intake to maintain hydration until symptoms have resolved and avoidance of spicy or greasy foods to prevent further irritation, recommended follow-up with PCP in 1 week if symptoms persist for further evaluation and management  Refilled tamsulosin for management of urinary frequency, will follow-up with PCP for further management, work note given stating COVID testing on 01/10/2022 is negative Final Clinical Impressions(s) / UC Diagnoses   Final diagnoses:  None   Discharge Instructions   None    ED Prescriptions   None    PDMP not reviewed this encounter.   Valinda Hoar, NP 01/18/22 1754

## 2022-01-18 NOTE — ED Triage Notes (Signed)
Per interpreter, pt states seen her last week for same. Pt c/o diarrhea x1wk and urinary frequency since he stopped his prostate medication.

## 2022-01-18 NOTE — Discharge Instructions (Signed)
??????? - ?? ?????? ????? ???? ??????? ?????? ?? ???? ????????? ??? ???? ???????? ????? ????? ???? - ????? ???????????   2 ??? ?? ????? ????? ?? ??? ???? ?? ?????? ???????? - ????? ?????????? ?? ???? ??? ???? ???? 10 ???? ? ???? ?????? ??? ????? ??????? - ????? ??????? ???????? ???? ???? ???? ???????? ?? ????? ??????? - ???? ??? ??? ?????? ?? ??????? ?????? ??? ????? ???? ??? ???? ??????? - ???? ??????? ?????? ???????? ????? ?? ???? ?????? ?? ???? ?????? - ???? ???????? ?? ????? ??????? ??? ????? ??????? ???? ??? ?????? ??? ???? ?? ??????? ?????? ?????  ?? ??? ?????????? - ??? ????? ????? ?????? ???? ????? ? ???? ????? ??? ???? ?? ????  ?? ??? COVID - ??? ???????? ???? ?????? ?? 10 ????? 2023 ?????? ? ??? ?? ?????? ?????? ????? ? ???? ?????? ???????   For your diarrhea -It is possible while you were sick last week that bacteria got introduced into your body and therefore we will start an antibiotic -Take azithromycin, take 2 tablets the very first day then take 1 tablet the remaining days -Take dicyclomine every morning and every evening for 10 days, this will help to decrease your diarrhea -You may use over-the-counter Imodium to help slow your diarrhea -Ensure that you are drinking lots of fluids to maintain your hydration until your diarrhea goes away -Avoid spicy and greasy foods as this may cause more stomach irritation -Please follow-up with your primary doctor if your diarrhea continues to persist as you will need more evaluation to determine the cause  For your prostate -Your medication has been refilled for 2 months, please take 1 tablet every evening  For COVID -Your testing on January 10, 2022 was negative, you have been given a note for work, it is the last page

## 2022-01-19 ENCOUNTER — Other Ambulatory Visit: Payer: Self-pay | Admitting: Physical Medicine and Rehabilitation

## 2022-01-19 DIAGNOSIS — M47816 Spondylosis without myelopathy or radiculopathy, lumbar region: Secondary | ICD-10-CM

## 2022-01-19 NOTE — Progress Notes (Unsigned)
Per pain diary patient received greater than 75% with first set of diagnostic facet medial branch blocks. We will proceed with second set of diagnostic facet blocks.

## 2022-01-31 NOTE — Procedures (Signed)
Lumbar Diagnostic Facet Joint Nerve Block with Fluoroscopic Guidance   Patient: Brandon Clark      Date of Birth: 03-05-1941 MRN: [id number] PCP: Stevphen Rochester, MD      Visit Date: 01/17/2022   Universal Protocol:    Date/Time: 07/31/236:27 AM  Consent Given By: the patient  Position: PRONE  Additional Comments: Vital signs were monitored before and after the procedure. Patient was prepped and draped in the usual sterile fashion. The correct patient, procedure, and site was verified.   Injection Procedure Details:   Procedure diagnoses:  1. Spondylosis without myelopathy or radiculopathy, lumbar region      Meds Administered:  Meds ordered this encounter  Medications   bupivacaine (MARCAINE) 0.5 % (with pres) injection 3 mL     Laterality: Bilateral  Location/Site: L5-S1, L4 medial branch and L5 dorsal ramus  Needle: 5.0 in., 25 ga.  Short bevel or Quincke spinal needle  Needle Placement: Oblique pedical  Findings:   -Comments: There was excellent flow of contrast along the articular pillars without intravascular flow.  Procedure Details: The fluoroscope beam is vertically oriented in AP and then obliqued 15 to 20 degrees to the ipsilateral side of the desired nerve to achieve the "Scotty dog" appearance.  The skin over the target area of the junction of the superior articulating process and the transverse process (sacral ala if blocking the L5 dorsal rami) was locally anesthetized with a 1 ml volume of 1% Lidocaine without Epinephrine.  The spinal needle was inserted and advanced in a trajectory view down to the target.   After contact with periosteum and negative aspirate for blood and CSF, correct placement without intravascular or epidural spread was confirmed by injecting 0.5 ml. of Isovue-250.  A spot radiograph was obtained of this image.    Next, a 0.5 ml. volume of the injectate described above was injected. The needle was then redirected to  the other facet joint nerves mentioned above if needed.  Prior to the procedure, the patient was given a Pain Diary which was completed for baseline measurements.  After the procedure, the patient rated their pain every 30 minutes and will continue rating at this frequency for a total of 5 hours.  The patient has been asked to complete the Diary and return to [place] by mail, fax or hand delivered as soon as possible.   Additional Comments:  The patient tolerated the procedure well Dressing: 2 x 2 sterile gauze and Band-Aid    Post-procedure details: Patient was observed during the procedure. Post-procedure instructions were reviewed.  Patient left the clinic in stable condition.

## 2022-01-31 NOTE — Progress Notes (Signed)
Brandon Clark - 81 y.o. male MRN [id number]  Date of birth: 1941/04/11  Office Visit Note: Visit Date: 01/17/2022 PCP: Stevphen Rochester, MD Referred by: Stevphen Rochester, MD  Subjective: Chief Complaint  Patient presents with   Lower Back - Pain   Right Leg - Pain   Left Leg - Pain   HPI:  Brandon Clark is a 81 y.o. male who comes in today at the request of Ellin Goodie, FNP for planned Bilateral  L5-S1 Lumbar facet/medial branch block with fluoroscopic guidance.  The patient has failed conservative care including home exercise, medications, time and activity modification.  This injection will be diagnostic and hopefully therapeutic.  Please see requesting physician notes for further details and justification.  Exam has shown concordant pain with facet joint loading.   ROS Otherwise per HPI.  Assessment & Plan: Visit Diagnoses:    ICD-10-CM   1. Spondylosis without myelopathy or radiculopathy, lumbar region  M47.816 XR C-ARM NO REPORT    Facet Injection    bupivacaine (MARCAINE) 0.5 % (with pres) injection 3 mL      Plan: No additional findings.   Meds & Orders:  Meds ordered this encounter  Medications   bupivacaine (MARCAINE) 0.5 % (with pres) injection 3 mL    Orders Placed This Encounter  Procedures   Facet Injection   XR C-ARM NO REPORT    Follow-up: Return for Review Pain Diary.   Procedures: No procedures performed  Lumbar Diagnostic Facet Joint Nerve Block with Fluoroscopic Guidance   Patient: Brandon Clark      Date of Birth: [birth year] MRN: [id number] PCP: Stevphen Rochester, MD      Visit Date: 01/17/2022   Universal Protocol:    Date/Time: 07/31/236:27 AM  Consent Given By: the patient  Position: PRONE  Additional Comments: Vital signs were monitored before and after the procedure. Patient was prepped and draped in the usual sterile fashion. The correct patient, procedure, and site was  verified.   Injection Procedure Details:   Procedure diagnoses:  1. Spondylosis without myelopathy or radiculopathy, lumbar region      Meds Administered:  Meds ordered this encounter  Medications   bupivacaine (MARCAINE) 0.5 % (with pres) injection 3 mL     Laterality: Bilateral  Location/Site: L5-S1, L4 medial branch and L5 dorsal ramus  Needle: 5.0 in., 25 ga.  Short bevel or Quincke spinal needle  Needle Placement: Oblique pedical  Findings:   -Comments: There was excellent flow of contrast along the articular pillars without intravascular flow.  Procedure Details: The fluoroscope beam is vertically oriented in AP and then obliqued 15 to 20 degrees to the ipsilateral side of the desired nerve to achieve the "Scotty dog" appearance.  The skin over the target area of the junction of the superior articulating process and the transverse process (sacral ala if blocking the L5 dorsal rami) was locally anesthetized with a 1 ml volume of 1% Lidocaine without Epinephrine.  The spinal needle was inserted and advanced in a trajectory view down to the target.   After contact with periosteum and negative aspirate for blood and CSF, correct placement without intravascular or epidural spread was confirmed by injecting 0.5 ml. of Isovue-250.  A spot radiograph was obtained of this image.    Next, a 0.5 ml. volume of the injectate described above was injected. The needle was then redirected to the other facet joint nerves mentioned above if needed.  Prior to the procedure, the  patient was given a Pain Diary which was completed for baseline measurements.  After the procedure, the patient rated their pain every 30 minutes and will continue rating at this frequency for a total of 5 hours.  The patient has been asked to complete the Diary and return to [place] by mail, fax or hand delivered as soon as possible.   Additional Comments:  The patient tolerated the procedure well Dressing: 2 x 2 sterile  gauze and Band-Aid    Post-procedure details: Patient was observed during the procedure. Post-procedure instructions were reviewed.  Patient left the clinic in stable condition.   Clinical History: MRI LUMBAR SPINE WITHOUT CONTRAST   TECHNIQUE: Multiplanar, multisequence MR imaging of the lumbar spine was performed. No intravenous contrast was administered.   COMPARISON:  Radiographs March 31, 2021.   FINDINGS: Segmentation:  Standard.   Alignment:  Physiologic.   Vertebrae: No fracture, evidence of discitis, or bone lesion. Endplate degenerative changes at L2-3 and L4-5.   Conus medullaris and cauda equina: Conus extends to the L1-2 level. Conus and cauda equina appear normal.   Paraspinal and other soft tissues: Negative.   Disc levels:   T12-L1: No spinal canal or neural foraminal stenosis.   L1-2: Disc bulge and mild facet degenerative changes resulting in mild bilateral neural foraminal narrowing. No significant spinal canal stenosis.   L2-3: Disc bulge and mild facet degenerative changes resulting in mild bilateral neural foraminal narrowing. No significant spinal canal stenosis.   L3-4 disc bulge and mild facet degenerative changes resulting mild bilateral neural foraminal narrowing. No significant spinal canal stenosis.   L4-5: Disc bulge with superimposed right central to foraminal disc protrusion and mild facet degenerative change resulting in mild narrowing of the right subarticular zone, moderate right and mild left neural foraminal narrowing. No significant spinal canal stenosis.   L5-S1: Disc bulge, prominent hypertrophic right facet degenerative changes and moderate left facet degenerative changes. Findings result in severe narrowing of the right subarticular zone and right neural foraminal narrowing. Mild left neural foraminal narrowing. No significant spinal canal stenosis.   IMPRESSION: 1. Degenerative changes at L5-S1, more pronounced  at the level of the right facet joint where there are prominent hypertrophic changes. Findings result in severe narrowing of the right subarticular zone and severe right neural foraminal narrowing at this level. 2. Moderate right and mild left neural foraminal narrowing at L4-5. 3. Mild bilateral neural foraminal narrowing at L1-2, L2-3 and L3-4. 4. No high-grade spinal canal stenosis.     Electronically Signed   By: Baldemar Lenis M.D.   On: 05/11/2021 17:45     Objective:  VS:  HT:    WT:   BMI:     BP:   HR: bpm  TEMP: ( )  RESP:  Physical Exam Vitals and nursing note reviewed.  Constitutional:      General: He is not in acute distress.    Appearance: Normal appearance. He is not ill-appearing.  HENT:     Head: Normocephalic and atraumatic.     Right Ear: External ear normal.     Left Ear: External ear normal.     Nose: No congestion.  Eyes:     Extraocular Movements: Extraocular movements intact.  Cardiovascular:     Rate and Rhythm: Normal rate.     Pulses: Normal pulses.  Pulmonary:     Effort: Pulmonary effort is normal. No respiratory distress.  Abdominal:     General: There is no distension.  Palpations: Abdomen is soft.  Musculoskeletal:        General: No tenderness or signs of injury.     Cervical back: Neck supple.     Right lower leg: No edema.     Left lower leg: No edema.     Comments: Patient has good distal strength without clonus.  Skin:    Findings: No erythema or rash.  Neurological:     General: No focal deficit present.     Mental Status: He is alert and oriented to person, place, and time.     Sensory: No sensory deficit.     Motor: No weakness or abnormal muscle tone.     Coordination: Coordination normal.  Psychiatric:        Mood and Affect: Mood normal.        Behavior: Behavior normal.      Imaging: No results found.

## 2022-02-08 DIAGNOSIS — R7303 Prediabetes: Secondary | ICD-10-CM | POA: Diagnosis not present

## 2022-02-08 DIAGNOSIS — R1011 Right upper quadrant pain: Secondary | ICD-10-CM | POA: Diagnosis not present

## 2022-02-08 DIAGNOSIS — Z125 Encounter for screening for malignant neoplasm of prostate: Secondary | ICD-10-CM | POA: Diagnosis not present

## 2022-02-08 DIAGNOSIS — R1012 Left upper quadrant pain: Secondary | ICD-10-CM | POA: Diagnosis not present

## 2022-02-08 DIAGNOSIS — R32 Unspecified urinary incontinence: Secondary | ICD-10-CM | POA: Diagnosis not present

## 2022-02-08 DIAGNOSIS — G4700 Insomnia, unspecified: Secondary | ICD-10-CM | POA: Diagnosis not present

## 2022-02-08 DIAGNOSIS — N401 Enlarged prostate with lower urinary tract symptoms: Secondary | ICD-10-CM | POA: Diagnosis not present

## 2022-02-08 DIAGNOSIS — N3941 Urge incontinence: Secondary | ICD-10-CM | POA: Diagnosis not present

## 2022-02-08 DIAGNOSIS — E781 Pure hyperglyceridemia: Secondary | ICD-10-CM | POA: Diagnosis not present

## 2022-02-08 DIAGNOSIS — E559 Vitamin D deficiency, unspecified: Secondary | ICD-10-CM | POA: Diagnosis not present

## 2022-02-08 DIAGNOSIS — I10 Essential (primary) hypertension: Secondary | ICD-10-CM | POA: Diagnosis not present

## 2022-02-08 DIAGNOSIS — R140 Abdominal distension (gaseous): Secondary | ICD-10-CM | POA: Diagnosis not present

## 2022-02-09 DIAGNOSIS — R413 Other amnesia: Secondary | ICD-10-CM | POA: Diagnosis not present

## 2022-02-09 DIAGNOSIS — R140 Abdominal distension (gaseous): Secondary | ICD-10-CM | POA: Diagnosis not present

## 2022-02-17 ENCOUNTER — Ambulatory Visit: Payer: Self-pay

## 2022-02-17 ENCOUNTER — Ambulatory Visit (INDEPENDENT_AMBULATORY_CARE_PROVIDER_SITE_OTHER): Payer: Medicaid Other | Admitting: Physical Medicine and Rehabilitation

## 2022-02-17 ENCOUNTER — Encounter: Payer: Self-pay | Admitting: Physical Medicine and Rehabilitation

## 2022-02-17 VITALS — BP 147/97 | HR 73

## 2022-02-17 DIAGNOSIS — M47816 Spondylosis without myelopathy or radiculopathy, lumbar region: Secondary | ICD-10-CM | POA: Diagnosis not present

## 2022-02-17 MED ORDER — BUPIVACAINE HCL 0.5 % IJ SOLN
3.0000 mL | Freq: Once | INTRAMUSCULAR | Status: AC
  Administered 2022-02-17: 3 mL

## 2022-02-17 NOTE — Progress Notes (Signed)
Pt state lower back pain that travels down his both legs. Pt state when getting up in the morning his legs feel heavy. Pt state movement makes the pain worse. Pt state he takes over the counter pain meds to help ease his pain.  Numeric Pain Rating Scale and Functional Assessment Average Pain 7   In the last MONTH (on 0-10 scale) has pain interfered with the following?  1. General activity like being  able to carry out your everyday physical activities such as walking, climbing stairs, carrying groceries, or moving a chair?  Rating(8)   +Driver, -BT, -Dye Allergies.

## 2022-02-17 NOTE — Patient Instructions (Signed)

## 2022-02-23 DIAGNOSIS — Z9889 Other specified postprocedural states: Secondary | ICD-10-CM | POA: Diagnosis not present

## 2022-02-23 DIAGNOSIS — L918 Other hypertrophic disorders of the skin: Secondary | ICD-10-CM | POA: Diagnosis not present

## 2022-02-23 DIAGNOSIS — R350 Frequency of micturition: Secondary | ICD-10-CM | POA: Diagnosis not present

## 2022-02-23 DIAGNOSIS — K219 Gastro-esophageal reflux disease without esophagitis: Secondary | ICD-10-CM | POA: Diagnosis not present

## 2022-02-23 DIAGNOSIS — N401 Enlarged prostate with lower urinary tract symptoms: Secondary | ICD-10-CM | POA: Diagnosis not present

## 2022-03-01 DIAGNOSIS — K760 Fatty (change of) liver, not elsewhere classified: Secondary | ICD-10-CM | POA: Diagnosis not present

## 2022-03-01 DIAGNOSIS — K449 Diaphragmatic hernia without obstruction or gangrene: Secondary | ICD-10-CM | POA: Diagnosis not present

## 2022-03-01 DIAGNOSIS — N281 Cyst of kidney, acquired: Secondary | ICD-10-CM | POA: Diagnosis not present

## 2022-03-02 NOTE — Procedures (Signed)
Lumbar Diagnostic Facet Joint Nerve Block with Fluoroscopic Guidance   Patient: Brandon Clark      Date of Birth: 01-25-1941 MRN: [id number] PCP: Stevphen Rochester, MD      Visit Date: 02/17/2022   Universal Protocol:    Date/Time: 08/30/237:59 PM  Consent Given By: the patient  Position: PRONE  Additional Comments: Vital signs were monitored before and after the procedure. Patient was prepped and draped in the usual sterile fashion. The correct patient, procedure, and site was verified.   Injection Procedure Details:   Procedure diagnoses:  1. Spondylosis without myelopathy or radiculopathy, lumbar region      Meds Administered:  Meds ordered this encounter  Medications   bupivacaine (MARCAINE) 0.5 % (with pres) injection 3 mL     Laterality: Bilateral  Location/Site: L5-S1, L4 medial branch and L5 dorsal ramus  Needle: 5.0 in., 25 ga.  Short bevel or Quincke spinal needle  Needle Placement: Oblique pedical  Findings:   -Comments: There was excellent flow of contrast along the articular pillars without intravascular flow.  Procedure Details: The fluoroscope beam is vertically oriented in AP and then obliqued 15 to 20 degrees to the ipsilateral side of the desired nerve to achieve the "Scotty dog" appearance.  The skin over the target area of the junction of the superior articulating process and the transverse process (sacral ala if blocking the L5 dorsal rami) was locally anesthetized with a 1 ml volume of 1% Lidocaine without Epinephrine.  The spinal needle was inserted and advanced in a trajectory view down to the target.   After contact with periosteum and negative aspirate for blood and CSF, correct placement without intravascular or epidural spread was confirmed by injecting 0.5 ml. of Isovue-250.  A spot radiograph was obtained of this image.    Next, a 0.5 ml. volume of the injectate described above was injected. The needle was then redirected to  the other facet joint nerves mentioned above if needed.  Prior to the procedure, the patient was given a Pain Diary which was completed for baseline measurements.  After the procedure, the patient rated their pain every 30 minutes and will continue rating at this frequency for a total of 5 hours.  The patient has been asked to complete the Diary and return to [place] by mail, fax or hand delivered as soon as possible.   Additional Comments:  The patient tolerated the procedure well Dressing: 2 x 2 sterile gauze and Band-Aid    Post-procedure details: Patient was observed during the procedure. Post-procedure instructions were reviewed.  Patient left the clinic in stable condition.

## 2022-03-02 NOTE — Progress Notes (Signed)
SWAN ZAYED - 81 y.o. male MRN [id number]  Date of birth: May 27, 1941  Office Visit Note: Visit Date: 02/17/2022 PCP: Stevphen Rochester, MD Referred by: Stevphen Rochester, MD  Subjective: Chief Complaint  Patient presents with   Lower Back - Pain   Right Leg - Pain   Left Leg - Pain   HPI:  Ebbie QUINTEN ALLERTON is a 81 y.o. male who comes in today for planned repeat Bilateral L5-S1 Lumbar facet/medial branch block with fluoroscopic guidance.  The patient has failed conservative care including home exercise, medications, time and activity modification.  This injection will be diagnostic and hopefully therapeutic.  Please see requesting physician notes for further details and justification.  Exam shows concordant low back pain with facet joint loading and extension. Patient received more than 80% pain relief from prior injection. This would be the second block in a diagnostic double block paradigm.     Referring:Megan Mayford Knife, FNP   ROS Otherwise per HPI.  Assessment & Plan: Visit Diagnoses:    ICD-10-CM   1. Spondylosis without myelopathy or radiculopathy, lumbar region  M47.816 XR C-ARM NO REPORT    Facet Injection    bupivacaine (MARCAINE) 0.5 % (with pres) injection 3 mL      Plan: No additional findings.   Meds & Orders:  Meds ordered this encounter  Medications   bupivacaine (MARCAINE) 0.5 % (with pres) injection 3 mL    Orders Placed This Encounter  Procedures   Facet Injection   XR C-ARM NO REPORT    Follow-up: Return in about 2 weeks (around 03/03/2022) for Review Pain Diary.   Procedures: No procedures performed  Lumbar Diagnostic Facet Joint Nerve Block with Fluoroscopic Guidance   Patient: JULIO ZAPPIA      Date of Birth: [birth year] MRN: [id number] PCP: Stevphen Rochester, MD      Visit Date: 02/17/2022   Universal Protocol:    Date/Time: 08/30/237:59 PM  Consent Given By: the patient  Position:  PRONE  Additional Comments: Vital signs were monitored before and after the procedure. Patient was prepped and draped in the usual sterile fashion. The correct patient, procedure, and site was verified.   Injection Procedure Details:   Procedure diagnoses:  1. Spondylosis without myelopathy or radiculopathy, lumbar region      Meds Administered:  Meds ordered this encounter  Medications   bupivacaine (MARCAINE) 0.5 % (with pres) injection 3 mL     Laterality: Bilateral  Location/Site: L5-S1, L4 medial branch and L5 dorsal ramus  Needle: 5.0 in., 25 ga.  Short bevel or Quincke spinal needle  Needle Placement: Oblique pedical  Findings:   -Comments: There was excellent flow of contrast along the articular pillars without intravascular flow.  Procedure Details: The fluoroscope beam is vertically oriented in AP and then obliqued 15 to 20 degrees to the ipsilateral side of the desired nerve to achieve the "Scotty dog" appearance.  The skin over the target area of the junction of the superior articulating process and the transverse process (sacral ala if blocking the L5 dorsal rami) was locally anesthetized with a 1 ml volume of 1% Lidocaine without Epinephrine.  The spinal needle was inserted and advanced in a trajectory view down to the target.   After contact with periosteum and negative aspirate for blood and CSF, correct placement without intravascular or epidural spread was confirmed by injecting 0.5 ml. of Isovue-250.  A spot radiograph was obtained of this image.    Next, a  0.5 ml. volume of the injectate described above was injected. The needle was then redirected to the other facet joint nerves mentioned above if needed.  Prior to the procedure, the patient was given a Pain Diary which was completed for baseline measurements.  After the procedure, the patient rated their pain every 30 minutes and will continue rating at this frequency for a total of 5 hours.  The patient  has been asked to complete the Diary and return to [place] by mail, fax or hand delivered as soon as possible.   Additional Comments:  The patient tolerated the procedure well Dressing: 2 x 2 sterile gauze and Band-Aid    Post-procedure details: Patient was observed during the procedure. Post-procedure instructions were reviewed.  Patient left the clinic in stable condition.   Clinical History: MRI LUMBAR SPINE WITHOUT CONTRAST   TECHNIQUE: Multiplanar, multisequence MR imaging of the lumbar spine was performed. No intravenous contrast was administered.   COMPARISON:  Radiographs March 31, 2021.   FINDINGS: Segmentation:  Standard.   Alignment:  Physiologic.   Vertebrae: No fracture, evidence of discitis, or bone lesion. Endplate degenerative changes at L2-3 and L4-5.   Conus medullaris and cauda equina: Conus extends to the L1-2 level. Conus and cauda equina appear normal.   Paraspinal and other soft tissues: Negative.   Disc levels:   T12-L1: No spinal canal or neural foraminal stenosis.   L1-2: Disc bulge and mild facet degenerative changes resulting in mild bilateral neural foraminal narrowing. No significant spinal canal stenosis.   L2-3: Disc bulge and mild facet degenerative changes resulting in mild bilateral neural foraminal narrowing. No significant spinal canal stenosis.   L3-4 disc bulge and mild facet degenerative changes resulting mild bilateral neural foraminal narrowing. No significant spinal canal stenosis.   L4-5: Disc bulge with superimposed right central to foraminal disc protrusion and mild facet degenerative change resulting in mild narrowing of the right subarticular zone, moderate right and mild left neural foraminal narrowing. No significant spinal canal stenosis.   L5-S1: Disc bulge, prominent hypertrophic right facet degenerative changes and moderate left facet degenerative changes. Findings result in severe narrowing of the right  subarticular zone and right neural foraminal narrowing. Mild left neural foraminal narrowing. No significant spinal canal stenosis.   IMPRESSION: 1. Degenerative changes at L5-S1, more pronounced at the level of the right facet joint where there are prominent hypertrophic changes. Findings result in severe narrowing of the right subarticular zone and severe right neural foraminal narrowing at this level. 2. Moderate right and mild left neural foraminal narrowing at L4-5. 3. Mild bilateral neural foraminal narrowing at L1-2, L2-3 and L3-4. 4. No high-grade spinal canal stenosis.     Electronically Signed   By: Baldemar Lenis M.D.   On: 05/11/2021 17:45     Objective:  VS:  HT:    WT:   BMI:     BP:(!) 147/97  HR:73bpm  TEMP: ( )  RESP:  Physical Exam Vitals and nursing note reviewed.  Constitutional:      General: He is not in acute distress.    Appearance: Normal appearance. He is not ill-appearing.  HENT:     Head: Normocephalic and atraumatic.     Right Ear: External ear normal.     Left Ear: External ear normal.     Nose: No congestion.  Eyes:     Extraocular Movements: Extraocular movements intact.  Cardiovascular:     Rate and Rhythm: Normal rate.  Pulses: Normal pulses.  Pulmonary:     Effort: Pulmonary effort is normal. No respiratory distress.  Abdominal:     General: There is no distension.     Palpations: Abdomen is soft.  Musculoskeletal:        General: No tenderness or signs of injury.     Cervical back: Neck supple.     Right lower leg: No edema.     Left lower leg: No edema.     Comments: Patient has good distal strength without clonus.  Skin:    Findings: No erythema or rash.  Neurological:     General: No focal deficit present.     Mental Status: He is alert and oriented to person, place, and time.     Sensory: No sensory deficit.     Motor: No weakness or abnormal muscle tone.     Coordination: Coordination normal.   Psychiatric:        Mood and Affect: Mood normal.        Behavior: Behavior normal.      Imaging: No results found.

## 2022-03-08 ENCOUNTER — Ambulatory Visit: Payer: Medicaid Other | Admitting: Physical Medicine and Rehabilitation

## 2022-03-09 ENCOUNTER — Ambulatory Visit: Payer: Medicaid Other | Admitting: Physical Medicine and Rehabilitation

## 2022-03-17 ENCOUNTER — Ambulatory Visit: Payer: Medicaid Other | Admitting: Physical Medicine and Rehabilitation

## 2022-03-18 ENCOUNTER — Ambulatory Visit: Payer: Medicaid Other | Admitting: Surgical

## 2022-03-18 ENCOUNTER — Encounter (HOSPITAL_COMMUNITY): Payer: Self-pay

## 2022-03-18 ENCOUNTER — Ambulatory Visit (HOSPITAL_COMMUNITY)
Admission: EM | Admit: 2022-03-18 | Discharge: 2022-03-18 | Disposition: A | Discharge status: Home / Self Care | Payer: Medicaid Other | Attending: Physician Assistant | Admitting: Physician Assistant

## 2022-03-18 DIAGNOSIS — M170 Bilateral primary osteoarthritis of knee: Secondary | ICD-10-CM | POA: Diagnosis not present

## 2022-03-18 DIAGNOSIS — M25562 Pain in left knee: Secondary | ICD-10-CM

## 2022-03-18 DIAGNOSIS — M25561 Pain in right knee: Secondary | ICD-10-CM

## 2022-03-18 DIAGNOSIS — G4489 Other headache syndrome: Secondary | ICD-10-CM | POA: Diagnosis not present

## 2022-03-18 DIAGNOSIS — J0110 Acute frontal sinusitis, unspecified: Secondary | ICD-10-CM

## 2022-03-18 DIAGNOSIS — R42 Dizziness and giddiness: Secondary | ICD-10-CM | POA: Diagnosis not present

## 2022-03-18 MED ORDER — IBUPROFEN 800 MG PO TABS
800.0000 mg | ORAL_TABLET | Freq: Once | ORAL | Status: AC
  Administered 2022-03-18: 800 mg via ORAL

## 2022-03-18 MED ORDER — MECLIZINE HCL 12.5 MG PO TABS: 12.5000 mg | ORAL_TABLET | Freq: Three times a day (TID) | ORAL | 0 refills | Status: DC | PRN

## 2022-03-18 MED ORDER — IBUPROFEN 800 MG PO TABS
ORAL_TABLET | ORAL | Status: AC
  Filled 2022-03-18: qty 1

## 2022-03-18 MED ORDER — PREDNISONE 10 MG PO TABS: 10.0000 mg | ORAL_TABLET | Freq: Three times a day (TID) | ORAL | 0 refills | Status: DC

## 2022-03-18 NOTE — ED Provider Notes (Signed)
[facility]    CSN: [id number] Arrival date & time: 03/18/22  1508      History   Chief Complaint Chief Complaint  Patient presents with   Dizziness    HPI Brandon Clark is a 81 y.o. male.   81 year old male presents with dizziness and headache.  Patient indicates for the past week he has had some upper respiratory sinus congestion with frontal and maxillary sinus pain and pressure.  He indicates that yesterday he started having dizziness, feeling like the room spinning around in circles, which was worse with moving turning and positional changes.  Patient indicates that the dizziness improved when he was sitting still.  He also indicates that he has had some headache which has been bilateral temporal and frontal centered around the front of the face and behind the eyes.  He indicates that he has not had any fever, chills, nausea or vomiting.  He denies any chest congestion or cough.  He relates he has taken some OTC medications without relief.   Dizziness Associated symptoms: headaches     Past Medical History:  Diagnosis Date   Acid reflux    BPH (benign prostatic hyperplasia)    Hiatal hernia    Hyperlipidemia    Hypertension    Pre-diabetes    Sleep apnea    sometimes does not use d/t poor fitting of the mask   Ulcer     Patient Active Problem List   Diagnosis Date Noted   Laceration of flexor muscle, fascia and tendon of left thumb at forearm level, sequela 03/16/2021   S/P Nissen fundoplication (without gastrostomy tube) procedure 06/02/2017   Dementia (HCC) 05/12/2017   Prediabetes 01/30/2017   Hiatal hernia 01/27/2017   Acid reflux 02/12/2016   Sleep apnea 02/12/2016   Diastasis recti 03/02/2015   Umbilical hernia 09/16/2014   BPV (benign positional vertigo) 07/01/2014   HTN (hypertension) 07/01/2014   Panic attack    Encephalopathy, hypertensive    Ataxia 06/30/2014   Vertigo 06/30/2014   Dyspepsia 04/03/2014   Encounter for  colorectal cancer screening 04/03/2014   Bilateral shoulder pain 09/17/2013   Bilateral knee pain 02/05/2013    Past Surgical History:  Procedure Laterality Date   ESOPHAGEAL MANOMETRY N/A 04/27/2016   Procedure: ESOPHAGEAL MANOMETRY (EM) with PH;  Surgeon: Midge Minium, MD;  Location: [facility];  Service: Endoscopy;  Laterality: N/A;   ESOPHAGEAL MANOMETRY N/A 04/19/2017   Procedure: ESOPHAGEAL MANOMETRY (EM);  Surgeon: Charlott Rakes, MD;  Location: [facility];  Service: Endoscopy;  Laterality: N/A;   FACIAL LACERATIONS REPAIR     INSERTION OF MESH N/A 06/02/2017   Procedure: INSERTION OF MESH;  Surgeon: Axel Filler, MD;  Location: [facility];  Service: General;  Laterality: N/A;       Home Medications    Prior to Admission medications   Medication Sig Start Date End Date Taking? Authorizing Provider  meclizine (ANTIVERT) 12.5 MG tablet Take 1 tablet (12.5 mg total) by mouth 3 (three) times daily as needed for dizziness. 03/18/22  Yes Ellsworth Lennox, PA-C  predniSONE (DELTASONE) 10 MG tablet Take 1 tablet (10 mg total) by mouth in the morning, at noon, and at bedtime. 03/18/22  Yes Ellsworth Lennox, PA-C  acetaminophen (TYLENOL) 500 MG tablet Take 500-1,000 mg by mouth every 6 (six) hours as needed.    [provider]  albuterol (VENTOLIN HFA) 108 (90 Base) MCG/ACT inhaler Inhale 1-2 puffs into the lungs every 4 (four) hours as needed for wheezing  or shortness of breath. 06/09/21   Domenick Gong, MD  aspirin EC 325 MG tablet Take 1 tablet (325 mg total) by mouth daily. 06/09/17   Everlene Farrier, PA-C  azithromycin (ZITHROMAX) 250 MG tablet Take 1 tablet (250 mg total) by mouth daily. Take first 2 tablets together, then 1 every day until finished. 01/18/22   White, Elita Boone, NP  benzonatate (TESSALON) 200 MG capsule Take 1 capsule (200 mg total) by mouth 3 (three) times daily as needed for cough. 01/10/22   Raspet, Noberto Retort, PA-C  dicyclomine (BENTYL) 20 MG tablet Take 1  tablet (20 mg total) by mouth 2 (two) times daily. 01/18/22   White, Elita Boone, NP  donepezil (ARICEPT) 10 MG tablet Take 1 tablet (10 mg total) by mouth at bedtime. 06/12/18   Hoy Register, MD  finasteride (PROSCAR) 5 MG tablet Take 1 tablet (5 mg total) by mouth daily. 10/16/19   Hoy Register, MD  fluticasone (FLONASE) 50 MCG/ACT nasal spray Place 2 sprays into both nostrils daily. 01/10/22   Raspet, Noberto Retort, PA-C  hydrochlorothiazide (HYDRODIURIL) 12.5 MG tablet Take 1 tablet (12.5 mg total) by mouth daily. 06/12/18   Hoy Register, MD  methocarbamol (ROBAXIN) 500 MG tablet Take 500 mg by mouth every 8 (eight) hours as needed for muscle spasms. 05/13/16   [provider]  simvastatin (ZOCOR) 10 MG tablet Take 1 tablet (10 mg total) by mouth at bedtime. 10/16/19   Hoy Register, MD  Spacer/Aero-Holding Chambers (AEROCHAMBER PLUS) inhaler Use with inhaler 06/09/21   Domenick Gong, MD  tamsulosin (FLOMAX) 0.4 MG CAPS capsule Take 1 capsule (0.4 mg total) by mouth daily. 01/18/22   Valinda Hoar, NP  traZODone (DESYREL) 50 MG tablet Take 1 tablet (50 mg total) by mouth at bedtime as needed for sleep. 10/16/19   Hoy Register, MD    Family History Family History  Problem Relation Age of Onset   Diabetes Mother    Diabetes Brother    Heart attack Neg Hx    Hyperlipidemia Neg Hx    Hypertension Neg Hx    Sudden death Neg Hx     Social History Social History   Tobacco Use   Smoking status: Never   Smokeless tobacco: Never  Vaping Use   Vaping Use: Never used  Substance Use Topics   Alcohol use: No    Alcohol/week: 0.0 standard drinks of alcohol   Drug use: No     Allergies   Ondansetron   Review of Systems Review of Systems  HENT:  Positive for sinus pressure and sinus pain.   Neurological:  Positive for dizziness and headaches.     Physical Exam Triage Vital Signs ED Triage Vitals  Enc Vitals Group     BP 03/18/22 1536 127/85     Pulse Rate  03/18/22 1536 76     Resp 03/18/22 1536 16     Temp 03/18/22 1536 98.1 F (36.7 C)     Temp Source 03/18/22 1536 Oral     SpO2 03/18/22 1536 93 %     Weight --      Height --      Head Circumference --      Peak Flow --      Pain Score 03/18/22 1537 0     Pain Loc --      Pain Edu? --      Excl. in GC? --    No data found.  Updated Vital Signs BP 127/85 (BP  Location: Left Arm)   Pulse 76   Temp 98.1 F (36.7 C) (Oral)   Resp 16   SpO2 93%   Visual Acuity Right Eye Distance:   Left Eye Distance:   Bilateral Distance:    Right Eye Near:   Left Eye Near:    Bilateral Near:     Physical Exam Constitutional:      Appearance: Normal appearance.  HENT:     Right Ear: Tympanic membrane and ear canal normal.     Left Ear: Tympanic membrane and ear canal normal.     Mouth/Throat:     Mouth: Mucous membranes are moist.     Pharynx: Oropharynx is clear. Uvula midline.     Comments: Sinuses: Pain is palpated along the frontal sinuses with compression. Cardiovascular:     Rate and Rhythm: Normal rate and regular rhythm.     Heart sounds: Normal heart sounds.  Pulmonary:     Effort: Pulmonary effort is normal.     Breath sounds: Normal air entry. Rhonchi present. No wheezing or rales.  Lymphadenopathy:     Cervical: No cervical adenopathy.  Neurological:     Mental Status: He is alert and oriented to person, place, and time.     Cranial Nerves: Cranial nerves 2-12 are intact.     Motor: Motor function is intact.     Coordination: Coordination is intact.      UC Treatments / Results  Labs (all labs ordered are listed, but only abnormal results are displayed) Labs Reviewed - No data to display  EKG   Radiology No results found.  Procedures Procedures (including critical care time)  Medications Ordered in UC Medications  ibuprofen (ADVIL) tablet 800 mg (800 mg Oral Given 03/18/22 1618)    Initial Impression / Assessment and Plan / UC Course  I have  reviewed the triage vital signs and the nursing notes.  Pertinent labs & imaging results that were available during my care of the patient were reviewed by me and considered in my medical decision making (see chart for details).       Plan: 1.  Advised take prednisone 10 mg every 8 hours until completed to help reduce the sinus inflammation and swelling. 2.  Advised take the Antivert every 6-8 hours as needed to help control the dizziness. 3.  Advised take ibuprofen 400-600 mg every 8 hours to help reduce headache pain. 4.  Advised to follow-up PCP or return to urgent care if symptoms fail to improve. Final Clinical Impressions(s) / UC Diagnoses   Final diagnoses:  Vertigo  Acute non-recurrent frontal sinusitis  Other headache syndrome     Discharge Instructions      Advised to take the meclizine every 8 hours in order to improve the dizziness. Advised to take prednisone 10 mg 3 times a day for 5 days only to help decrease the sinus congestion and pressure. Advised to take ibuprofen 400-600 mg every 8 hours with food to help decrease the headache. Advised to follow-up PCP or return to urgent care if symptoms fail to improve.    ED Prescriptions     Medication Sig Dispense Auth. Provider   predniSONE (DELTASONE) 10 MG tablet Take 1 tablet (10 mg total) by mouth in the morning, at noon, and at bedtime. 15 tablet Ellsworth Lennox, PA-C   meclizine (ANTIVERT) 12.5 MG tablet Take 1 tablet (12.5 mg total) by mouth 3 (three) times daily as needed for dizziness. 30 tablet Ellsworth Lennox, PA-C  PDMP not reviewed this encounter.   Ellsworth Lennox, PA-C 03/18/22 1630

## 2022-03-18 NOTE — Discharge Instructions (Addendum)
Advised to take the meclizine every 8 hours in order to improve the dizziness. Advised to take prednisone 10 mg 3 times a day for 5 days only to help decrease the sinus congestion and pressure. Advised to take ibuprofen 400-600 mg every 8 hours with food to help decrease the headache. Advised to follow-up PCP or return to urgent care if symptoms fail to improve.

## 2022-03-18 NOTE — ED Triage Notes (Signed)
Per interpretor pt states he has been dizzy and headache since yesterday.

## 2022-03-19 ENCOUNTER — Encounter: Payer: Self-pay | Admitting: Surgical

## 2022-03-19 MED ORDER — BUPIVACAINE HCL 0.25 % IJ SOLN
4.0000 mL | INTRAMUSCULAR | Status: AC | PRN
  Administered 2022-03-18: 4 mL via INTRA_ARTICULAR

## 2022-03-19 MED ORDER — LIDOCAINE HCL 1 % IJ SOLN
5.0000 mL | INTRAMUSCULAR | Status: AC | PRN
  Administered 2022-03-18: 5 mL

## 2022-03-19 MED ORDER — METHYLPREDNISOLONE ACETATE 40 MG/ML IJ SUSP
40.0000 mg | INTRAMUSCULAR | Status: AC | PRN
  Administered 2022-03-18: 40 mg via INTRA_ARTICULAR

## 2022-03-19 NOTE — Progress Notes (Cosign Needed Addendum)
   Procedure Note  Patient: Brandon Clark             Date of Birth: 02-17-1941           MRN: [id number]             Visit Date: 03/18/2022  Procedures: Visit Diagnoses:  1. Pain in both knees, unspecified chronicity     Large Joint Inj: bilateral knee on 03/18/2022 10:54 AM Indications: diagnostic evaluation, joint swelling and pain Details: 18 G 1.5 in needle, superolateral approach  Arthrogram: No  Medications (Right): 5 mL lidocaine 1 %; 4 mL bupivacaine 0.25 %; 40 mg methylPREDNISolone acetate 40 MG/ML Aspirate (Right): 15 mL Medications (Left): 5 mL lidocaine 1 %; 4 mL bupivacaine 0.25 %; 40 mg methylPREDNISolone acetate 40 MG/ML Aspirate (Left): 40 mL Outcome: tolerated well, no immediate complications Procedure, treatment alternatives, risks and benefits explained, specific risks discussed. Consent was given by the patient. Immediately prior to procedure a time out was called to verify the correct patient, procedure, equipment, support staff and site/side marked as required. Patient was prepped and draped in the usual sterile fashion.

## 2022-03-21 ENCOUNTER — Encounter (HOSPITAL_COMMUNITY): Payer: Self-pay | Admitting: *Deleted

## 2022-03-21 ENCOUNTER — Encounter (HOSPITAL_COMMUNITY): Payer: Self-pay

## 2022-03-21 ENCOUNTER — Emergency Department (HOSPITAL_COMMUNITY): Payer: Medicaid Other

## 2022-03-21 ENCOUNTER — Other Ambulatory Visit: Payer: Self-pay

## 2022-03-21 ENCOUNTER — Ambulatory Visit (HOSPITAL_COMMUNITY)
Admission: EM | Admit: 2022-03-21 | Discharge: 2022-03-21 | Disposition: A | Discharge status: Still In Hospital | Payer: Medicaid Other | Attending: Family Medicine | Admitting: Family Medicine

## 2022-03-21 ENCOUNTER — Emergency Department (HOSPITAL_COMMUNITY)
Admission: EM | Admit: 2022-03-21 | Discharge: 2022-03-22 | Discharge status: Against Medical Advice | Payer: Medicaid Other | Attending: Physician Assistant | Admitting: Physician Assistant

## 2022-03-21 DIAGNOSIS — R519 Headache, unspecified: Secondary | ICD-10-CM | POA: Diagnosis not present

## 2022-03-21 DIAGNOSIS — Z5321 Procedure and treatment not carried out due to patient leaving prior to being seen by health care provider: Secondary | ICD-10-CM | POA: Diagnosis not present

## 2022-03-21 DIAGNOSIS — R42 Dizziness and giddiness: Secondary | ICD-10-CM | POA: Insufficient documentation

## 2022-03-21 LAB — CBC WITH DIFFERENTIAL/PLATELET
Abs Immature Granulocytes: 0.08 10*3/uL — ABNORMAL HIGH (ref 0.00–0.07)
Basophils Absolute: 0.1 10*3/uL (ref 0.0–0.1)
Basophils Relative: 1 %
Eosinophils Absolute: 0.1 10*3/uL (ref 0.0–0.5)
Eosinophils Relative: 2 %
HCT: 50.8 % (ref 39.0–52.0)
Hemoglobin: 17.1 g/dL — ABNORMAL HIGH (ref 13.0–17.0)
Immature Granulocytes: 1 %
Lymphocytes Relative: 44 %
Lymphs Abs: 3.6 10*3/uL (ref 0.7–4.0)
MCH: 30.1 pg (ref 26.0–34.0)
MCHC: 33.7 g/dL (ref 30.0–36.0)
MCV: 89.3 fL (ref 80.0–100.0)
Monocytes Absolute: 0.9 10*3/uL (ref 0.1–1.0)
Monocytes Relative: 12 %
Neutro Abs: 3.2 10*3/uL (ref 1.7–7.7)
Neutrophils Relative %: 40 %
Platelets: 212 10*3/uL (ref 150–400)
RBC: 5.69 MIL/uL (ref 4.22–5.81)
RDW: 13.7 % (ref 11.5–15.5)
WBC: 8 10*3/uL (ref 4.0–10.5)
nRBC: 0 % (ref 0.0–0.2)

## 2022-03-21 LAB — BASIC METABOLIC PANEL
Anion gap: 8 (ref 5–15)
BUN: 20 mg/dL (ref 8–23)
CO2: 25 mmol/L (ref 22–32)
Calcium: 8.8 mg/dL — ABNORMAL LOW (ref 8.9–10.3)
Chloride: 103 mmol/L (ref 98–111)
Creatinine, Ser: 1.07 mg/dL (ref 0.61–1.24)
GFR, Estimated: >60 mL/min (ref >60)
Glucose, Bld: 115 mg/dL — ABNORMAL HIGH (ref 70–99)
Potassium: 3.9 mmol/L (ref 3.5–5.1)
Sodium: 136 mmol/L (ref 135–145)

## 2022-03-21 LAB — URINALYSIS, ROUTINE W REFLEX MICROSCOPIC
Bilirubin Urine: NEGATIVE
Glucose, UA: NEGATIVE mg/dL
Hgb urine dipstick: NEGATIVE
Ketones, ur: NEGATIVE mg/dL
Leukocytes,Ua: NEGATIVE
Nitrite: NEGATIVE
Protein, ur: NEGATIVE mg/dL
Specific Gravity, Urine: 1.019 (ref 1.005–1.030)
pH: 5 (ref 5.0–8.0)

## 2022-03-21 NOTE — ED Triage Notes (Signed)
[place] provider in room to assess PT. Provider reported to Pt he needed to go to ED for CT and MRI. Pt reports he needs better medicine than he got on his last visit to [facility] 3 days ago.

## 2022-03-21 NOTE — ED Triage Notes (Signed)
Pt stated when asked what # we could call his friend . Pt had reported a friend dropped him off and would come back in 30 mins. 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 Pt reports he does not know the # for friend. Pt also reported his Daughter has a broken leg and do not call her.

## 2022-03-21 NOTE — ED Triage Notes (Signed)
Was seen 4 days ago  for inner ear infection and having dizziness.  Patient denies weakness and when he lies down feels the room is spinning.

## 2022-03-21 NOTE — ED Triage Notes (Signed)
Pt reports he has been dizzy for 6 days . Pt reports his vision is blurry when lying flat. Pt reports he feels like his head is going to explode . Pt was seen on 03/18/22. Pt has been taking medication . Pt also has nausea.

## 2022-03-21 NOTE — ED Triage Notes (Signed)
Pt rolled out of [facility] because Pt refused CARE LINK to ED. Pt reported his friend was in the parking lot. Staff unable to find friend in parking lot. Pt refusing to go to ED. Pt is assessed to be a high fall risk and Security was called for assistance with PT safety. Pt sitting on bench with security with PT.

## 2022-03-21 NOTE — ED Provider Triage Note (Signed)
Emergency Medicine Provider Triage Evaluation Note  Brandon Clark , a 81 y.o. male  was evaluated in triage.  Pt complains of HA, dizziness x 3 days. Seen at [facility] dx with vertigo, taking meclizine without relief. Feels off balance when her walk. Dizziness worse when he turns head  Review of Systems  Positive: Dizziness, head Negative:   Physical Exam  Pulse 91   Resp 15   SpO2 92%  Gen:   Awake, no distress   Resp:  Normal effort  MSK:   Moves extremities without difficulty  Neuro:  No facial droop, moves BIL extremities without difficulty Other:    Medical Decision Making  Medically screening exam initiated at 5:21 PM.  Appropriate orders placed.  Lonza [name] Marinaro was informed that the remainder of the evaluation will be completed by another provider, this initial triage assessment does not replace that evaluation, and the importance of remaining in the ED until their evaluation is complete.  Dizziness  Arabic interpretor used    Lasaro Primm A, PA-C 03/21/22 1722

## 2022-03-21 NOTE — ED Notes (Signed)
Pt states "I'm finished." Every time this writer trying to get his vital signs

## 2022-03-21 NOTE — ED Provider Notes (Addendum)
Patient is seen in triage.  He comes in today saying that he continues to have dizziness and a splitting headache.  He also says he is short of breath.  He was seen here 3 days ago and prescribed Antivert and prednisone.  He states Antivert is not helping his vertigo at all.  He is very off balance, and he has trouble sleeping.  Interpreter for Arabic was used through the entirety of the encounter.  I explained repeatedly that we do not have anything else to offer in the clinic here to help his symptoms.  With his severe headache and that he is continuing to have vertigo, I have asked him to please proceed to the emergency room.  He repeatedly asked me for something to help him rest.    A friend has dropped him off and we have asked him for the phone number so we can contact them and send him   Staff cannot get any phone number for that friend, and his daughter did not pick up the phone.  We are therefore sending him by [facility] or ambulance   Barrett Henle, MD 03/21/22 [id number]    Barrett Henle, MD 03/21/22 [phone]

## 2022-03-21 NOTE — ED Notes (Signed)
Patient states the wait is long ahd she is leaving

## 2022-03-21 NOTE — ED Triage Notes (Signed)
During TC to [facility] for transport to ED for stroke like Sx's for 6 days. Pt walked to staff desk / Pt unsteady walking with cane . Pt reports he wants medicine . Pt unsteady and high fall risk. Pt was assisted back to chair in triage.

## 2022-03-23 DIAGNOSIS — K429 Umbilical hernia without obstruction or gangrene: Secondary | ICD-10-CM | POA: Diagnosis not present

## 2022-03-23 DIAGNOSIS — R42 Dizziness and giddiness: Secondary | ICD-10-CM | POA: Diagnosis not present

## 2022-03-23 DIAGNOSIS — K760 Fatty (change of) liver, not elsewhere classified: Secondary | ICD-10-CM | POA: Diagnosis not present

## 2022-03-23 DIAGNOSIS — Z9889 Other specified postprocedural states: Secondary | ICD-10-CM | POA: Diagnosis not present

## 2022-03-23 DIAGNOSIS — R350 Frequency of micturition: Secondary | ICD-10-CM | POA: Diagnosis not present

## 2022-03-23 DIAGNOSIS — G4700 Insomnia, unspecified: Secondary | ICD-10-CM | POA: Diagnosis not present

## 2022-03-23 DIAGNOSIS — K449 Diaphragmatic hernia without obstruction or gangrene: Secondary | ICD-10-CM | POA: Diagnosis not present

## 2022-03-24 ENCOUNTER — Ambulatory Visit (INDEPENDENT_AMBULATORY_CARE_PROVIDER_SITE_OTHER): Payer: Medicaid Other | Admitting: Physical Medicine and Rehabilitation

## 2022-03-24 ENCOUNTER — Encounter: Payer: Self-pay | Admitting: Physical Medicine and Rehabilitation

## 2022-03-24 VITALS — BP 108/71 | HR 93 | Resp 93

## 2022-03-24 DIAGNOSIS — M545 Low back pain, unspecified: Secondary | ICD-10-CM | POA: Diagnosis not present

## 2022-03-24 DIAGNOSIS — G8929 Other chronic pain: Secondary | ICD-10-CM | POA: Diagnosis not present

## 2022-03-24 DIAGNOSIS — M170 Bilateral primary osteoarthritis of knee: Secondary | ICD-10-CM | POA: Diagnosis not present

## 2022-03-24 DIAGNOSIS — M47816 Spondylosis without myelopathy or radiculopathy, lumbar region: Secondary | ICD-10-CM

## 2022-03-24 NOTE — Progress Notes (Signed)
Brandon Clark - 81 y.o. male MRN [id number]  Date of birth: 10/26/1940  Office Visit Note: Visit Date: 03/24/2022 PCP: Jolinda Croak, MD Referred by: Jolinda Croak, MD  Subjective: Chief Complaint  Patient presents with   Lower Back - Pain   HPI: Brandon Clark is a 81 y.o. male who comes in today for evaluation of chronic, worsening and severe bilateral lower back pain. Interpreter present during our visit today, patient is somewhat of a poor historian. Patient states pain ongoing for many years and worsens when moving from sitting to standing position, describes his pain as a constant sore and aching sensation, currently rates as 7 out of 10. Some relief of pain with home exercise regimen, rest and use of medications. Patient is scheduled to start formal physical therapy with our in house team in the upcoming weeks for chronic lower back issues. Patients lumbar MRI from 2022 exhibits degenerative changes at L5-S1, more pronounced at the level of the right facet joint where there are prominent hypertrophic changes. No high grade spinal canal stenosis noted. Patient recently underwent diagnostic bilateral L5-S1 facet joint injections on 7/17 and 8/17, reports greater than 80% relief and increased functionality with these injections. Patient currently being managed by Gloriann Loan, PA for chronic bilateral knee osteoarthritis, recent bilateral knee injections on 03/18/2022 and reports good relief of pain with this procedure.  Patient is currently using cane to assist with ambulation and prevent falls.  Patient denies focal weakness, numbness and tingling. Patient denies recent trauma or falls.      Review of Systems  Musculoskeletal:  Positive for back pain.  Neurological:  Negative for tingling, sensory change, focal weakness and weakness.  All other systems reviewed and are negative.  Otherwise per HPI.  Assessment & Plan: Visit Diagnoses:    ICD-10-CM    1. Chronic bilateral low back pain without sciatica  M54.50 Ambulatory referral to Physical Medicine Rehab   G89.29     2. Spondylosis without myelopathy or radiculopathy, lumbar region  M47.816 Ambulatory referral to Physical Medicine Rehab    3. Facet hypertrophy of lumbar region  M47.816 Ambulatory referral to Physical Medicine Rehab    4. Primary osteoarthritis of both knees  M17.0        Plan: Findings:  Chronic, worsening and severe bilateral axial back pain.  Patient continues to have severe pain despite good conservative therapy such as home exercise regimen, rest and use of medications.  Patient's clinical presentation and exam are consistent with facet mediated pain, he does have severe pain with lumbar extension upon exam today.  Good relief with 2 sets of diagnostic facet blocks, next step is to perform bilateral L5-S1 radiofrequency ablation under fluoroscopic guidance.  I did discuss radiofrequency ablation in detail with patient today, he did have some concerns about this procedure impacting his sexual performance.  I did explain to him that this specific procedure should not have any impact on his sexual health.  He has no questions at this time.  No red flag symptoms noted upon exam today.    Meds & Orders: No orders of the defined types were placed in this encounter.   Orders Placed This Encounter  Procedures   Ambulatory referral to Physical Medicine Rehab    Follow-up: Return for Bilateral L5-S1 radiofrequency ablation.   Procedures: No procedures performed      Clinical History: MRI LUMBAR SPINE WITHOUT CONTRAST   TECHNIQUE: Multiplanar, multisequence MR imaging of the lumbar spine  was performed. No intravenous contrast was administered.   COMPARISON:  Radiographs March 31, 2021.   FINDINGS: Segmentation:  Standard.   Alignment:  Physiologic.   Vertebrae: No fracture, evidence of discitis, or bone lesion. Endplate degenerative changes at L2-3 and  L4-5.   Conus medullaris and cauda equina: Conus extends to the L1-2 level. Conus and cauda equina appear normal.   Paraspinal and other soft tissues: Negative.   Disc levels:   T12-L1: No spinal canal or neural foraminal stenosis.   L1-2: Disc bulge and mild facet degenerative changes resulting in mild bilateral neural foraminal narrowing. No significant spinal canal stenosis.   L2-3: Disc bulge and mild facet degenerative changes resulting in mild bilateral neural foraminal narrowing. No significant spinal canal stenosis.   L3-4 disc bulge and mild facet degenerative changes resulting mild bilateral neural foraminal narrowing. No significant spinal canal stenosis.   L4-5: Disc bulge with superimposed right central to foraminal disc protrusion and mild facet degenerative change resulting in mild narrowing of the right subarticular zone, moderate right and mild left neural foraminal narrowing. No significant spinal canal stenosis.   L5-S1: Disc bulge, prominent hypertrophic right facet degenerative changes and moderate left facet degenerative changes. Findings result in severe narrowing of the right subarticular zone and right neural foraminal narrowing. Mild left neural foraminal narrowing. No significant spinal canal stenosis.   IMPRESSION: 1. Degenerative changes at L5-S1, more pronounced at the level of the right facet joint where there are prominent hypertrophic changes. Findings result in severe narrowing of the right subarticular zone and severe right neural foraminal narrowing at this level. 2. Moderate right and mild left neural foraminal narrowing at L4-5. 3. Mild bilateral neural foraminal narrowing at L1-2, L2-3 and L3-4. 4. No high-grade spinal canal stenosis.     Electronically Signed   By: Pedro Earls M.D.   On: 05/11/2021 17:45   He reports that he has never smoked. He has never used smokeless tobacco. No results for input(s):  "HGBA1C", "LABURIC" in the last 8760 hours.  Objective:  VS:  HT:    WT:   BMI:     BP:108/71  HR:93bpm  TEMP: ( )  RESP:  Physical Exam Vitals and nursing note reviewed.  HENT:     Head: Normocephalic and atraumatic.     Right Ear: External ear normal.     Left Ear: External ear normal.     Nose: Nose normal.     Mouth/Throat:     Mouth: Mucous membranes are moist.  Eyes:     Extraocular Movements: Extraocular movements intact.  Cardiovascular:     Rate and Rhythm: Normal rate.     Pulses: Normal pulses.  Pulmonary:     Effort: Pulmonary effort is normal.  Abdominal:     General: Abdomen is flat. There is no distension.  Musculoskeletal:        General: Tenderness present.     Cervical back: Normal range of motion.     Comments: Pt is slow to rise from seated position to standing. Concordant low back pain with facet loading, lumbar spine extension and rotation. Strong distal strength without clonus, no pain upon palpation of greater trochanters. Sensation intact bilaterally. Ambulates with cane, gait slow and unsteady.   Skin:    General: Skin is warm and dry.     Capillary Refill: Capillary refill takes less than 2 seconds.  Neurological:     General: No focal deficit present.     Mental  Status: He is alert and oriented to person, place, and time.  Psychiatric:        Mood and Affect: Mood normal.        Behavior: Behavior normal.     Ortho Exam  Imaging: No results found.  Past Medical/Family/Surgical/Social History: Medications & Allergies reviewed per EMR, new medications updated. Patient Active Problem List   Diagnosis Date Noted   Laceration of flexor muscle, fascia and tendon of left thumb at forearm level, sequela 03/16/2021   S/P Nissen fundoplication (without gastrostomy tube) procedure 06/02/2017   Dementia ([place]) 05/12/2017   Prediabetes 01/30/2017   Hiatal hernia 01/27/2017   Acid reflux 02/12/2016   Sleep apnea 02/12/2016   Diastasis recti  16/04/9603   Umbilical hernia 54/03/8118   BPV (benign positional vertigo) 07/01/2014   HTN (hypertension) 07/01/2014   Panic attack    Encephalopathy, hypertensive    Ataxia 06/30/2014   Vertigo 06/30/2014   Dyspepsia 04/03/2014   Encounter for colorectal cancer screening 04/03/2014   Bilateral shoulder pain 09/17/2013   Bilateral knee pain 02/05/2013   Past Medical History:  Diagnosis Date   Acid reflux    BPH (benign prostatic hyperplasia)    Hiatal hernia    Hyperlipidemia    Hypertension    Pre-diabetes    Sleep apnea    sometimes does not use d/t poor fitting of the mask   Ulcer    Family History  Problem Relation Age of Onset   Diabetes Mother    Diabetes Brother    Heart attack Neg Hx    Hyperlipidemia Neg Hx    Hypertension Neg Hx    Sudden death Neg Hx    Past Surgical History:  Procedure Laterality Date   ESOPHAGEAL MANOMETRY N/A 04/27/2016   Procedure: ESOPHAGEAL MANOMETRY (EM) with [place];  Surgeon: Lucilla Lame, MD;  Location: [facility] ENDOSCOPY;  Service: Endoscopy;  Laterality: N/A;   ESOPHAGEAL MANOMETRY N/A 04/19/2017   Procedure: ESOPHAGEAL MANOMETRY (EM);  Surgeon: Wilford Corner, MD;  Location: [facility];  Service: Endoscopy;  Laterality: N/A;   FACIAL LACERATIONS REPAIR     INSERTION OF MESH N/A 06/02/2017   Procedure: INSERTION OF MESH;  Surgeon: Ralene Ok, MD;  Location: [facility];  Service: General;  Laterality: N/A;   Social History   Occupational History   Occupation: Chief Financial Officer  Tobacco Use   Smoking status: Never   Smokeless tobacco: Never  Vaping Use   Vaping Use: Never used  Substance and Sexual Activity   Alcohol use: No    Alcohol/week: 0.0 standard drinks of alcohol   Drug use: No   Sexual activity: Never

## 2022-03-24 NOTE — Progress Notes (Signed)
 .  Numeric Pain Rating Scale and Functional Assessment Average Pain 6   In the last MONTH (on 0-10 scale) has pain interfered with the following?  1. General activity like being  able to carry out your everyday physical activities such as walking, climbing stairs, carrying groceries, or moving a chair?  Rating(7)   +Driver, -BT, -Dye Allergies.  

## 2022-03-28 DIAGNOSIS — Z23 Encounter for immunization: Secondary | ICD-10-CM | POA: Diagnosis not present

## 2022-03-29 ENCOUNTER — Ambulatory Visit: Payer: Medicaid Other | Admitting: Surgical

## 2022-03-30 DIAGNOSIS — R5383 Other fatigue: Secondary | ICD-10-CM | POA: Diagnosis not present

## 2022-03-30 DIAGNOSIS — R7303 Prediabetes: Secondary | ICD-10-CM | POA: Diagnosis not present

## 2022-03-30 DIAGNOSIS — K5900 Constipation, unspecified: Secondary | ICD-10-CM | POA: Diagnosis not present

## 2022-03-30 DIAGNOSIS — G4700 Insomnia, unspecified: Secondary | ICD-10-CM | POA: Diagnosis not present

## 2022-03-30 DIAGNOSIS — R350 Frequency of micturition: Secondary | ICD-10-CM | POA: Diagnosis not present

## 2022-03-30 DIAGNOSIS — R42 Dizziness and giddiness: Secondary | ICD-10-CM | POA: Diagnosis not present

## 2022-03-30 DIAGNOSIS — E781 Pure hyperglyceridemia: Secondary | ICD-10-CM | POA: Diagnosis not present

## 2022-03-30 DIAGNOSIS — L910 Hypertrophic scar: Secondary | ICD-10-CM | POA: Diagnosis not present

## 2022-03-30 DIAGNOSIS — I10 Essential (primary) hypertension: Secondary | ICD-10-CM | POA: Diagnosis not present

## 2022-03-30 DIAGNOSIS — N401 Enlarged prostate with lower urinary tract symptoms: Secondary | ICD-10-CM | POA: Diagnosis not present

## 2022-03-30 DIAGNOSIS — N3941 Urge incontinence: Secondary | ICD-10-CM | POA: Diagnosis not present

## 2022-04-01 ENCOUNTER — Ambulatory Visit: Payer: Medicaid Other | Admitting: Surgical

## 2022-04-15 ENCOUNTER — Ambulatory Visit (INDEPENDENT_AMBULATORY_CARE_PROVIDER_SITE_OTHER): Payer: Medicaid Other | Admitting: Surgical

## 2022-04-15 ENCOUNTER — Ambulatory Visit (INDEPENDENT_AMBULATORY_CARE_PROVIDER_SITE_OTHER): Payer: Medicaid Other

## 2022-04-15 DIAGNOSIS — M25521 Pain in right elbow: Secondary | ICD-10-CM

## 2022-04-15 DIAGNOSIS — M25511 Pain in right shoulder: Secondary | ICD-10-CM

## 2022-04-15 DIAGNOSIS — M25811 Other specified joint disorders, right shoulder: Secondary | ICD-10-CM

## 2022-04-15 DIAGNOSIS — M25812 Other specified joint disorders, left shoulder: Secondary | ICD-10-CM | POA: Diagnosis not present

## 2022-04-17 ENCOUNTER — Encounter: Payer: Self-pay | Admitting: Surgical

## 2022-04-17 MED ORDER — METHYLPREDNISOLONE ACETATE 40 MG/ML IJ SUSP
40.0000 mg | INTRAMUSCULAR | Status: AC | PRN
  Administered 2022-04-15: 40 mg via INTRA_ARTICULAR

## 2022-04-17 MED ORDER — BUPIVACAINE HCL 0.25 % IJ SOLN
9.0000 mL | INTRAMUSCULAR | Status: AC | PRN
  Administered 2022-04-15: 9 mL via INTRA_ARTICULAR

## 2022-04-17 NOTE — Progress Notes (Signed)
Office Visit Note   Patient: Brandon Clark           Date of Birth: [birth year]           MRN: [id number] Visit Date: 04/15/2022 Requested by: Jolinda Croak, [address] [place],  [place] [id number] PCP: Jolinda Croak, MD  Subjective: Chief Complaint  Patient presents with   Left Shoulder - Pain   Right Knee - Pain   Left Knee - Pain    HPI: Brandon Clark is a 81 y.o. male who presents to the office reporting bilateral shoulder pain.  Patient notes pain for several years.  He has had injections in the past which have helped.  No recent injury.  Localizes pain diffusely throughout the lateral aspect of both shoulders.  Right shoulder pain radiates to the elbow.  No radiation of left shoulder pain.  No neck pain, scapular pain.  Occasional numbness and tingling in both hands.  Also notes some new focal right elbow pain that he localizes to the lateral and dorsal aspect of the elbow.  Does have occasional weakness and dropping objects with the right elbow pain.  No prior shoulder or neck surgery..                ROS: All systems reviewed are negative as they relate to the chief complaint within the history of present illness.  Patient denies fevers or chills.  Assessment & Plan: Visit Diagnoses:  1. Right shoulder pain, unspecified chronicity   2. Pain in right elbow     Plan: Patient is a 81 year old male who presents for evaluation of bilateral shoulder pain and right elbow pain.  He is mainly concerned about his shoulder pain today.  Has history of prior radiographs of the shoulders that have shown no significant pathology about 2 years ago.  Right shoulder was reimaged as it is bothering him slightly more but there is no significant evidence of arthritis or any acute osseous abnormality noted.  He does have right elbow radiographs taken today that demonstrate joint space narrowing of the elbow joint which seems at least partially  responsible for the new right elbow pain that he has been experiencing without any history of trauma.  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  After discussion of options, he would like to try bilateral shoulder injections today which have given him good relief in the past.  Subacromial injections administered and patient tolerated procedure well.  He will follow-up with the office as needed and recommended he follow-up if he would like to repeat knee injections or if you would like further evaluation of elbow pain.  Interpreter was utilized during this encounter.  Follow-Up Instructions: No follow-ups on file.   Orders:  Orders Placed This Encounter  Procedures   XR Shoulder Right   XR Elbow Complete Right (3+View)   No orders of the defined types were placed in this encounter.     Procedures: Large Joint Inj: bilateral subacromial bursa on 04/15/2022 1:35 PM Indications: diagnostic evaluation and pain Details: 22 G 1.5 in needle, posterior approach  Arthrogram: No  Medications (Right): 40 mg methylPREDNISolone acetate 40 MG/ML; 9 mL bupivacaine 0.25 % Medications (Left): 40 mg methylPREDNISolone acetate 40 MG/ML; 9 mL bupivacaine 0.25 % Outcome: tolerated well, no immediate complications Procedure, treatment alternatives, risks and benefits explained, specific risks discussed. Consent was given  by the patient. Immediately prior to procedure a time out was called to verify the correct patient, procedure, equipment, support staff and site/side marked as required. Patient was prepped and draped in the usual sterile fashion.       Clinical Data: No additional findings.  Objective: Vital Signs: There were no vitals taken for this visit.  Physical Exam:  Constitutional: Patient appears well-developed HEENT:  Head: Normocephalic Eyes:EOM are normal Neck: Normal range of  motion Cardiovascular: Normal rate Pulmonary/chest: Effort normal Neurologic: Patient is alert Skin: Skin is warm Psychiatric: Patient has normal mood and affect  Ortho Exam: Ortho exam demonstrates left shoulder with 45 degrees external rotation, 90 degrees abduction, 170 degrees forward flexion.  This compared with the right shoulder with 50 degrees X rotation, 90 degrees abduction, 170 degrees forward flexion.  He has excellent rotator cuff strength of supra, infra, subscap.  Positive Neer and Hawkins impingement signs bilaterally.  Tenderness over the bicipital groove bilaterally.  No tenderness over the [facility] joint bilaterally.  5/5 motor strength of bilateral grip strength, finger abduction, pronation/admission, bicep, tricep, deltoid.  Does have some very minimal tenderness over the lateral epicondyle of the right elbow.  More tenderness along the region of the elbow joint as well as an area over the dorsal proximal forearm about 3 to 4 cm distal to the lateral epicondyle.  No tenderness over the medial epicondyle.  Bicep tendon is palpable with no weakness in supination/bicep flexion.  No cellulitis or skin changes noted or any evidence of bursitis.  Specialty Comments:  MRI LUMBAR SPINE WITHOUT CONTRAST   TECHNIQUE: Multiplanar, multisequence MR imaging of the lumbar spine was performed. No intravenous contrast was administered.   COMPARISON:  Radiographs March 31, 2021.   FINDINGS: Segmentation:  Standard.   Alignment:  Physiologic.   Vertebrae: No fracture, evidence of discitis, or bone lesion. Endplate degenerative changes at L2-3 and L4-5.   Conus medullaris and cauda equina: Conus extends to the L1-2 level. Conus and cauda equina appear normal.   Paraspinal and other soft tissues: Negative.   Disc levels:   T12-L1: No spinal canal or neural foraminal stenosis.   L1-2: Disc bulge and mild facet degenerative changes resulting in mild bilateral neural foraminal  narrowing. No significant spinal canal stenosis.   L2-3: Disc bulge and mild facet degenerative changes resulting in mild bilateral neural foraminal narrowing. No significant spinal canal stenosis.   L3-4 disc bulge and mild facet degenerative changes resulting mild bilateral neural foraminal narrowing. No significant spinal canal stenosis.   L4-5: Disc bulge with superimposed right central to foraminal disc protrusion and mild facet degenerative change resulting in mild narrowing of the right subarticular zone, moderate right and mild left neural foraminal narrowing. No significant spinal canal stenosis.   L5-S1: Disc bulge, prominent hypertrophic right facet degenerative changes and moderate left facet degenerative changes. Findings result in severe narrowing of the right subarticular zone and right neural foraminal narrowing. Mild left neural foraminal narrowing. No significant spinal canal stenosis.   IMPRESSION: 1. Degenerative changes at L5-S1, more pronounced at the level of the right facet joint where there are prominent hypertrophic changes. Findings result in severe narrowing of the right subarticular zone and severe right neural foraminal narrowing at this level. 2. Moderate right and mild left neural foraminal narrowing at L4-5. 3. Mild bilateral neural foraminal narrowing at L1-2, L2-3 and L3-4. 4. No high-grade spinal canal stenosis.     Electronically Signed   By: Jeanella Craze  Debbrah Alar M.D.   On: 05/11/2021 17:45  Imaging: No results found.   PMFS History: Patient Active Problem List   Diagnosis Date Noted   Laceration of flexor muscle, fascia and tendon of left thumb at forearm level, sequela 03/16/2021   S/P Nissen fundoplication (without gastrostomy tube) procedure 06/02/2017   Dementia ([place]) 05/12/2017   Prediabetes 01/30/2017   Hiatal hernia 01/27/2017   Acid reflux 02/12/2016   Sleep apnea 02/12/2016   Diastasis recti [id number]    Umbilical hernia [id number]   BPV (benign positional vertigo) 07/01/2014   HTN (hypertension) 07/01/2014   Panic attack    Encephalopathy, hypertensive    Ataxia 06/30/2014   Vertigo 06/30/2014   Dyspepsia 04/03/2014   Encounter for colorectal cancer screening 04/03/2014   Bilateral shoulder pain 09/17/2013   Bilateral knee pain 02/05/2013   Past Medical History:  Diagnosis Date   Acid reflux    BPH (benign prostatic hyperplasia)    Hiatal hernia    Hyperlipidemia    Hypertension    Pre-diabetes    Sleep apnea    sometimes does not use d/t poor fitting of the mask   Ulcer     Family History  Problem Relation Age of Onset   Diabetes Mother    Diabetes Brother    Heart attack Neg Hx    Hyperlipidemia Neg Hx    Hypertension Neg Hx    Sudden death Neg Hx     Past Surgical History:  Procedure Laterality Date   ESOPHAGEAL MANOMETRY N/A 04/27/2016   Procedure: ESOPHAGEAL MANOMETRY (EM) with [place];  Surgeon: Lucilla Lame, MD;  Location: [facility] END[facility]COPY;  Service: Endoscopy;  Laterality: N/A;   ESOPHAGEAL MANOMETRY N/A 04/19/2017   Procedure: ESOPHAGEAL MANOMETRY (EM);  Surgeon: Wilford Corner, MD;  Location: [facility];  Service: Endoscopy;  Laterality: N/A;   FACIAL LACERATIONS REPAIR     INSERTION OF MESH N/A 06/02/2017   Procedure: INSERTION OF MESH;  Surgeon: Ralene Ok, MD;  Location: [facility];  Service: General;  Laterality: N/A;   Social History   Occupational History   Occupation: Chief Financial Officer  Tobacco Use   Smoking status: Never   Smokeless tobacco: Never  Vaping Use   Vaping Use: Never used  Substance and Sexual Activity   Alcohol use: No    Alcohol/week: 0.0 standard drinks of alcohol   Drug use: No   Sexual activity: Never

## 2022-04-25 ENCOUNTER — Encounter: Payer: Medicaid Other | Admitting: Physical Medicine and Rehabilitation

## 2022-05-13 ENCOUNTER — Ambulatory Visit: Payer: Medicaid Other | Admitting: Surgical

## 2022-05-18 ENCOUNTER — Encounter: Payer: Self-pay | Admitting: Surgical

## 2022-05-18 ENCOUNTER — Ambulatory Visit: Payer: Medicaid Other | Admitting: Surgical

## 2022-05-18 ENCOUNTER — Other Ambulatory Visit: Payer: Self-pay | Admitting: Surgical

## 2022-05-18 DIAGNOSIS — M170 Bilateral primary osteoarthritis of knee: Secondary | ICD-10-CM

## 2022-05-18 MED ORDER — LIDOCAINE HCL 1 % IJ SOLN
5.0000 mL | INTRAMUSCULAR | Status: AC | PRN
  Administered 2022-05-18: 5 mL

## 2022-05-18 NOTE — Progress Notes (Signed)
Office Visit Note   Patient: Brandon Clark           Date of Birth: April 08, 1941           MRN: [id number] Visit Date: 05/18/2022 Requested by: Jolinda Croak, [address] [place],  [place] [id number] PCP: Jolinda Croak, MD  Subjective: Chief Complaint  Patient presents with   Right Knee - Pain   Left Knee - Pain    HPI: Brandon Clark is a 81 y.o. male who presents to the office reporting bilateral knee pain.  Has history of bilateral knee osteoarthritis.  Had previous aspiration injection of both knees on 03/18/2022 with good relief but pain has returned along with the fluid.  No new injury.  Has extreme pain with walking.  Is tired of suffering and wants to talk about surgical options.  No radicular pain or groin pain.  Shoulders are doing okay from the injections he had at last visit..                ROS: All systems reviewed are negative as they relate to the chief complaint within the history of present illness.  Patient denies fevers or chills.  Assessment & Plan: Visit Diagnoses: No diagnosis found.  Plan: Patient is a 81 year old male who presents for evaluation of bilateral knee pain.  He has history of bilateral knee osteoarthritis and has had serial injections/aspiration for years.  He is tired of having this pain and would like to discuss surgical options.  Had long discussion about total knee arthroplasty including the risks and benefits.  Discussion of risks and benefits included but not limited to nerve/blood vessel damage, knee stiffness, knee instability, need for revision surgery in the future, prosthetic joint infection, fracture during procedure, medical complications such as DVT/PE/stroke/death.  After lengthy discussion with the use of interpreter, patient would like to hold off on knee replacement at this time.  He would like to have the knees aspirated today and injected but last injection was 2 months ago supplanted just  aspirate knees today.  He had 50 cc aspirated from the right knee and 140 cc aspirated from the left knee.  If he still has a similar volume in the left knee at the next visit when we aspirate and inject his knees in mid December, may need to consider rheumatologic work-up based on the sheer volume of fluid that is present in the left knee.  We will also set him up for physical therapy for both knees and he will speak with the physical therapy coordinator at the next visit to get set up.  Follow-Up Instructions: No follow-ups on file.   Orders:  No orders of the defined types were placed in this encounter.  No orders of the defined types were placed in this encounter.     Procedures: Large Joint Inj: bilateral knee on 05/18/2022 1:23 PM Indications: diagnostic evaluation, joint swelling and pain Details: 18 G 1.5 in needle, superolateral approach  Arthrogram: No  Medications (Right): 5 mL lidocaine 1 % Aspirate (Right): 50 mL Medications (Left): 5 mL lidocaine 1 % Aspirate (Left): 140 mL Outcome: tolerated well, no immediate complications Procedure, treatment alternatives, risks and benefits explained, specific risks discussed. Consent was given by the patient. Immediately prior to procedure a time out was called to verify the correct patient, procedure, equipment, support staff and site/side marked as required. Patient was prepped and draped in the usual sterile fashion.  Clinical Data: No additional findings.  Objective: Vital Signs: There were no vitals taken for this visit.  Physical Exam:  Constitutional: Patient appears well-developed HEENT:  Head: Normocephalic Eyes:EOM are normal Neck: Normal range of motion Cardiovascular: Normal rate Pulmonary/chest: Effort normal Neurologic: Patient is alert Skin: Skin is warm Psychiatric: Patient has normal mood and affect  Ortho Exam: Ortho exam demonstrates left and right knees with large effusions.  Tenderness over  the medial lateral joint lines of both knees.  No calf tenderness.  Negative Homans' sign.  No pain with hip range of motion.  Able to perform straight leg raise with intact quad strength.  There is no cellulitis or skin changes noted over the knee.  No abnormal warmth or draining sinus.  Specialty Comments:  MRI LUMBAR SPINE WITHOUT CONTRAST   TECHNIQUE: Multiplanar, multisequence MR imaging of the lumbar spine was performed. No intravenous contrast was administered.   COMPARISON:  Radiographs March 31, 2021.   FINDINGS: Segmentation:  Standard.   Alignment:  Physiologic.   Vertebrae: No fracture, evidence of discitis, or bone lesion. Endplate degenerative changes at L2-3 and L4-5.   Conus medullaris and cauda equina: Conus extends to the L1-2 level. Conus and cauda equina appear normal.   Paraspinal and other soft tissues: Negative.   Disc levels:   T12-L1: No spinal canal or neural foraminal stenosis.   L1-2: Disc bulge and mild facet degenerative changes resulting in mild bilateral neural foraminal narrowing. No significant spinal canal stenosis.   L2-3: Disc bulge and mild facet degenerative changes resulting in mild bilateral neural foraminal narrowing. No significant spinal canal stenosis.   L3-4 disc bulge and mild facet degenerative changes resulting mild bilateral neural foraminal narrowing. No significant spinal canal stenosis.   L4-5: Disc bulge with superimposed right central to foraminal disc protrusion and mild facet degenerative change resulting in mild narrowing of the right subarticular zone, moderate right and mild left neural foraminal narrowing. No significant spinal canal stenosis.   L5-S1: Disc bulge, prominent hypertrophic right facet degenerative changes and moderate left facet degenerative changes. Findings result in severe narrowing of the right subarticular zone and right neural foraminal narrowing. Mild left neural foraminal narrowing.  No significant spinal canal stenosis.   IMPRESSION: 1. Degenerative changes at L5-S1, more pronounced at the level of the right facet joint where there are prominent hypertrophic changes. Findings result in severe narrowing of the right subarticular zone and severe right neural foraminal narrowing at this level. 2. Moderate right and mild left neural foraminal narrowing at L4-5. 3. Mild bilateral neural foraminal narrowing at L1-2, L2-3 and L3-4. 4. No high-grade spinal canal stenosis.     Electronically Signed   By: Baldemar Lenis M.D.   On: 05/11/2021 17:45  Imaging: No results found.   PMFS History: Patient Active Problem List   Diagnosis Date Noted   Laceration of flexor muscle, fascia and tendon of left thumb at forearm level, sequela 03/16/2021   S/P Nissen fundoplication (without gastrostomy tube) procedure 06/02/2017   Dementia (HCC) 05/12/2017   Prediabetes 01/30/2017   Hiatal hernia 01/27/2017   Acid reflux 02/12/2016   Sleep apnea 02/12/2016   Diastasis recti 03/02/2015   Umbilical hernia 09/16/2014   BPV (benign positional vertigo) 07/01/2014   HTN (hypertension) 07/01/2014   Panic attack    Encephalopathy, hypertensive    Ataxia 06/30/2014   Vertigo 06/30/2014   Dyspepsia 04/03/2014   Encounter for colorectal cancer screening 04/03/2014   Bilateral shoulder pain 09/17/2013  Bilateral knee pain 02/05/2013   Past Medical History:  Diagnosis Date   Acid reflux    BPH (benign prostatic hyperplasia)    Hiatal hernia    Hyperlipidemia    Hypertension    Pre-diabetes    Sleep apnea    sometimes does not use d/t poor fitting of the mask   Ulcer     Family History  Problem Relation Age of Onset   Diabetes Mother    Diabetes Brother    Heart attack Neg Hx    Hyperlipidemia Neg Hx    Hypertension Neg Hx    Sudden death Neg Hx     Past Surgical History:  Procedure Laterality Date   ESOPHAGEAL MANOMETRY N/A 04/27/2016   Procedure:  ESOPHAGEAL MANOMETRY (EM) with [place];  Surgeon: Lucilla Lame, MD;  Location: [facility];  Service: Endoscopy;  Laterality: N/A;   ESOPHAGEAL MANOMETRY N/A 04/19/2017   Procedure: ESOPHAGEAL MANOMETRY (EM);  Surgeon: Wilford Corner, MD;  Location: [facility];  Service: Endoscopy;  Laterality: N/A;   FACIAL LACERATIONS REPAIR     INSERTION OF MESH N/A 06/02/2017   Procedure: INSERTION OF MESH;  Surgeon: Ralene Ok, MD;  Location: [facility];  Service: General;  Laterality: N/A;   Social History   Occupational History   Occupation: Chief Financial Officer  Tobacco Use   Smoking status: Never   Smokeless tobacco: Never  Vaping Use   Vaping Use: Never used  Substance and Sexual Activity   Alcohol use: No    Alcohol/week: 0.0 standard drinks of alcohol   Drug use: No   Sexual activity: Never

## 2022-06-01 ENCOUNTER — Ambulatory Visit (HOSPITAL_COMMUNITY)
Admission: EM | Admit: 2022-06-01 | Discharge: 2022-06-01 | Disposition: A | Discharge status: Home / Self Care | Payer: Medicaid Other | Attending: Internal Medicine | Admitting: Internal Medicine

## 2022-06-01 ENCOUNTER — Encounter (HOSPITAL_COMMUNITY): Payer: Self-pay | Admitting: Emergency Medicine

## 2022-06-01 DIAGNOSIS — H9313 Tinnitus, bilateral: Secondary | ICD-10-CM | POA: Diagnosis not present

## 2022-06-01 DIAGNOSIS — R519 Headache, unspecified: Secondary | ICD-10-CM

## 2022-06-01 MED ORDER — CETIRIZINE HCL 5 MG PO TABS: 5.0000 mg | ORAL_TABLET | Freq: Every day | ORAL | 0 refills | Status: AC

## 2022-06-01 NOTE — Discharge Instructions (Addendum)
Take cetirizine 5 mg once daily to help with your fluid behind your eardrums.  Call the audiologist listed on your paperwork to schedule an appointment for follow-up.  Take Tylenol 1000 mg every 6 hours as needed for headache.  If you develop any new or worsening symptoms or do not improve in the next 2 to 3 days, please return.  If your symptoms are severe, please go to the emergency room.  Follow-up with your primary care provider for further evaluation and management of your symptoms as well as ongoing wellness visits.  I hope you feel better!

## 2022-06-01 NOTE — ED Triage Notes (Signed)
Used interpretor Pt c/o tinnitus in bilat ears that is now all the time. Had it a couple months ago.  Pt also c/o posterior headache for 2 days. Denies taking any medications for pain.

## 2022-06-01 NOTE — ED Provider Notes (Signed)
[facility]    CSN: [id number] Arrival date & time: 06/01/22  1455      History   Chief Complaint Chief Complaint  Patient presents with   Headache   Tinnitus    HPI Brandon Clark is a 81 y.o. male.   Patient presents to urgent care for evaluation of tinnitus to both ears for the last 2 days.  He states that prior to 2 days ago, the tinnitus was intermittent and would come and go without known trigger.  Tinnitus has been on and off for approximately 1 month now.  He is not experiencing any dizziness, nausea, lightheadedness, vision changes, sore throat, cough, or other URI symptoms but does report an intermittent occipital headache.  No known cause or relieving factors for any of his symptoms.  He has not attempted any use of medications prior to arrival urgent care for his headache or tinnitus.  Headache is currently a 5 on a scale of 0-10 and he states this is very mild at this time.  He is concerned that he may be experiencing symptoms related to a stroke but he has never had a stroke in the past.  Denies unilateral weakness, slurred speech, balance changes, changes in gait, recent falls, past history of head trauma, and blurry vision.  He was seen by his PCP yesterday but states that he failed to mention the tinnitus to them at his appointment.  He has never had any cerebrovascular problems in the past.  Takes daily blood pressure medications as prescribed.  Denies other known past medical problems. He has never been seen by audiology or ENT for tinnitus.   Headache   Past Medical History:  Diagnosis Date   Acid reflux    BPH (benign prostatic hyperplasia)    Hiatal hernia    Hyperlipidemia    Hypertension    Pre-diabetes    Sleep apnea    sometimes does not use d/t poor fitting of the mask   Ulcer     Patient Active Problem List   Diagnosis Date Noted   Laceration of flexor muscle, fascia and tendon of left thumb at forearm level, sequela  03/16/2021   S/P Nissen fundoplication (without gastrostomy tube) procedure 06/02/2017   Dementia (HCC) 05/12/2017   Prediabetes 01/30/2017   Hiatal hernia 01/27/2017   Acid reflux 02/12/2016   Sleep apnea 02/12/2016   Diastasis recti 03/02/2015   Umbilical hernia 09/16/2014   BPV (benign positional vertigo) 07/01/2014   HTN (hypertension) 07/01/2014   Panic attack    Encephalopathy, hypertensive    Ataxia 06/30/2014   Vertigo 06/30/2014   Dyspepsia 04/03/2014   Encounter for colorectal cancer screening 04/03/2014   Bilateral shoulder pain 09/17/2013   Bilateral knee pain 02/05/2013    Past Surgical History:  Procedure Laterality Date   ESOPHAGEAL MANOMETRY N/A 04/27/2016   Procedure: ESOPHAGEAL MANOMETRY (EM) with PH;  Surgeon: Midge Minium, MD;  Location: [facility];  Service: Endoscopy;  Laterality: N/A;   ESOPHAGEAL MANOMETRY N/A 04/19/2017   Procedure: ESOPHAGEAL MANOMETRY (EM);  Surgeon: Charlott Rakes, MD;  Location: [facility];  Service: Endoscopy;  Laterality: N/A;   FACIAL LACERATIONS REPAIR     INSERTION OF MESH N/A 06/02/2017   Procedure: INSERTION OF MESH;  Surgeon: Axel Filler, MD;  Location: [facility];  Service: General;  Laterality: N/A;       Home Medications    Prior to Admission medications   Medication Sig Start Date End Date Taking? Authorizing Provider  cetirizine (  ZYRTEC) 5 MG tablet Take 1 tablet (5 mg total) by mouth daily. 06/01/22 07/01/22 Yes Carlisle Beers, FNP  acetaminophen (TYLENOL) 500 MG tablet Take 500-1,000 mg by mouth every 6 (six) hours as needed.    [provider]  albuterol (VENTOLIN HFA) 108 (90 Base) MCG/ACT inhaler Inhale 1-2 puffs into the lungs every 4 (four) hours as needed for wheezing or shortness of breath. 06/09/21   Domenick Gong, MD  aspirin EC 325 MG tablet Take 1 tablet (325 mg total) by mouth daily. 06/09/17   Everlene Farrier, PA-C  azithromycin (ZITHROMAX) 250 MG tablet Take 1 tablet (250  mg total) by mouth daily. Take first 2 tablets together, then 1 every day until finished. 01/18/22   White, Elita Boone, NP  benzonatate (TESSALON) 200 MG capsule Take 1 capsule (200 mg total) by mouth 3 (three) times daily as needed for cough. 01/10/22   Raspet, Noberto Retort, PA-C  dicyclomine (BENTYL) 20 MG tablet Take 1 tablet (20 mg total) by mouth 2 (two) times daily. 01/18/22   White, Elita Boone, NP  donepezil (ARICEPT) 10 MG tablet Take 1 tablet (10 mg total) by mouth at bedtime. 06/12/18   Hoy Register, MD  finasteride (PROSCAR) 5 MG tablet Take 1 tablet (5 mg total) by mouth daily. 10/16/19   Hoy Register, MD  fluticasone (FLONASE) 50 MCG/ACT nasal spray Place 2 sprays into both nostrils daily. 01/10/22   Raspet, Noberto Retort, PA-C  hydrochlorothiazide (HYDRODIURIL) 12.5 MG tablet Take 1 tablet (12.5 mg total) by mouth daily. 06/12/18   Hoy Register, MD  meclizine (ANTIVERT) 12.5 MG tablet Take 1 tablet (12.5 mg total) by mouth 3 (three) times daily as needed for dizziness. 03/18/22   Ellsworth Lennox, PA-C  methocarbamol (ROBAXIN) 500 MG tablet Take 500 mg by mouth every 8 (eight) hours as needed for muscle spasms. 05/13/16   [provider]  predniSONE (DELTASONE) 10 MG tablet Take 1 tablet (10 mg total) by mouth in the morning, at noon, and at bedtime. 03/18/22   Ellsworth Lennox, PA-C  simvastatin (ZOCOR) 10 MG tablet Take 1 tablet (10 mg total) by mouth at bedtime. 10/16/19   Hoy Register, MD  Spacer/Aero-Holding Chambers (AEROCHAMBER PLUS) inhaler Use with inhaler 06/09/21   Domenick Gong, MD  tamsulosin (FLOMAX) 0.4 MG CAPS capsule Take 1 capsule (0.4 mg total) by mouth daily. 01/18/22   Valinda Hoar, NP  traZODone (DESYREL) 50 MG tablet Take 1 tablet (50 mg total) by mouth at bedtime as needed for sleep. 10/16/19   Hoy Register, MD    Family History Family History  Problem Relation Age of Onset   Diabetes Mother    Diabetes Brother    Heart attack Neg Hx    Hyperlipidemia Neg  Hx    Hypertension Neg Hx    Sudden death Neg Hx     Social History Social History   Tobacco Use   Smoking status: Never   Smokeless tobacco: Never  Vaping Use   Vaping Use: Never used  Substance Use Topics   Alcohol use: No    Alcohol/week: 0.0 standard drinks of alcohol   Drug use: No     Allergies   Ondansetron   Review of Systems Review of Systems  Neurological:  Positive for headaches.  Per HPI   Physical Exam Triage Vital Signs ED Triage Vitals  Enc Vitals Group     BP 06/01/22 1605 (!) 133/92     Pulse Rate 06/01/22 1605 85  Resp 06/01/22 1605 19     Temp 06/01/22 1605 98.1 F (36.7 C)     Temp Source 06/01/22 1605 Oral     SpO2 06/01/22 1605 96 %     Weight --      Height --      Head Circumference --      Peak Flow --      Pain Score 06/01/22 1604 7     Pain Loc --      Pain Edu? --      Excl. in GC? --    No data found.  Updated Vital Signs BP (!) 133/92 (BP Location: Right Arm)   Pulse 85   Temp 98.1 F (36.7 C) (Oral)   Resp 19   SpO2 96%   Visual Acuity Right Eye Distance:   Left Eye Distance:   Bilateral Distance:    Right Eye Near:   Left Eye Near:    Bilateral Near:     Physical Exam Vitals and nursing note reviewed.  Constitutional:      Appearance: He is not ill-appearing or toxic-appearing.  HENT:     Head: Normocephalic and atraumatic.     Right Ear: Hearing, ear canal and external ear normal. Tympanic membrane is bulging.     Left Ear: Hearing, tympanic membrane, ear canal and external ear normal.     Ears:     Comments: Bulging TM to the right without erythema or drainage indicating mild fluid behind the ear.    Nose: Nose normal.     Mouth/Throat:     Lips: Pink.  Eyes:     General: Lids are normal. Vision grossly intact. Gaze aligned appropriately.     Extraocular Movements: Extraocular movements intact.     Conjunctiva/sclera: Conjunctivae normal.  Cardiovascular:     Rate and Rhythm: Normal rate and  regular rhythm.     Heart sounds: Normal heart sounds, S1 normal and S2 normal.  Pulmonary:     Effort: Pulmonary effort is normal. No respiratory distress.     Breath sounds: Normal breath sounds and air entry.  Musculoskeletal:     Cervical back: Neck supple.  Skin:    General: Skin is warm and dry.     Capillary Refill: Capillary refill takes less than 2 seconds.     Findings: No rash.  Neurological:     General: No focal deficit present.     Mental Status: He is alert and oriented to person, place, and time. Mental status is at baseline.     Cranial Nerves: Cranial nerves 2-12 are intact. No dysarthria or facial asymmetry.     Sensory: Sensation is intact.     Motor: Motor function is intact.     Coordination: Coordination is intact.     Gait: Gait is intact.     Comments: 5/5 strength throughout.  Nonfocal neuroexam.  Psychiatric:        Mood and Affect: Mood normal.        Speech: Speech normal.        Behavior: Behavior normal.        Thought Content: Thought content normal.        Judgment: Judgment normal.      UC Treatments / Results  Labs (all labs ordered are listed, but only abnormal results are displayed) Labs Reviewed - No data to display  EKG   Radiology No results found.  Procedures Procedures (including critical care time)  Medications Ordered in UC Medications -  No data to display  Initial Impression / Assessment and Plan / UC Course  I have reviewed the triage vital signs and the nursing notes.  Pertinent labs & imaging results that were available during my care of the patient were reviewed by me and considered in my medical decision making (see chart for details).   1.  Tinnitus of both ears and bad headache Neurologic exam is without focal finding.  Patient is neurologically at his baseline and ambulates without difficulty or limp.  Unclear etiology of patient's tinnitus, although he does have some fluid behind the right tympanic membrane and  therefore we will treat this with cetirizine 5 mg once daily.  Walking referral to audiology given.  Patient advised to call them at his convenience to schedule an appointment for follow-up.  Tylenol given in clinic to help with headache.  He may use Tylenol 1000 mg every 6 hours as needed for headache at home.  Advised follow-up with PCP if symptoms fail to improve in the next couple of days prior to audiology appointment.   Discussed physical exam and available lab work findings in clinic with patient.  Counseled patient regarding appropriate use of medications and potential side effects for all medications recommended or prescribed today. Discussed red flag signs and symptoms of worsening condition,when to call the PCP office, return to urgent care, and when to seek higher level of care in the emergency department. Patient verbalizes understanding and agreement with plan. All questions answered. Patient discharged in stable condition.    Final Clinical Impressions(s) / UC Diagnoses   Final diagnoses:  Tinnitus of both ears  Bad headache     Discharge Instructions      Take cetirizine 5 mg once daily to help with your fluid behind your eardrums.  Call the audiologist listed on your paperwork to schedule an appointment for follow-up.  Take Tylenol 1000 mg every 6 hours as needed for headache.  If you develop any new or worsening symptoms or do not improve in the next 2 to 3 days, please return.  If your symptoms are severe, please go to the emergency room.  Follow-up with your primary care provider for further evaluation and management of your symptoms as well as ongoing wellness visits.  I hope you feel better!     ED Prescriptions     Medication Sig Dispense Auth. Provider   cetirizine (ZYRTEC) 5 MG tablet Take 1 tablet (5 mg total) by mouth daily. 30 tablet Carlisle BeersStanhope, Velvet Moomaw M, FNP      PDMP not reviewed this encounter.   Carlisle BeersStanhope, Sabine Tenenbaum M, [place]NP 06/01/22 1755

## 2022-06-07 ENCOUNTER — Encounter: Payer: Medicaid Other | Admitting: Physical Medicine and Rehabilitation

## 2022-06-15 ENCOUNTER — Ambulatory Visit: Payer: Medicaid Other | Admitting: Orthopedic Surgery

## 2022-06-17 ENCOUNTER — Ambulatory Visit: Payer: Medicaid Other | Admitting: Surgical

## 2022-09-01 NOTE — Telephone Encounter (Signed)
Error

## 2023-03-02 ENCOUNTER — Ambulatory Visit: Payer: Medicaid Other | Admitting: Orthopedic Surgery

## 2023-04-24 ENCOUNTER — Emergency Department (HOSPITAL_COMMUNITY): Payer: Worker's Compensation

## 2023-04-24 ENCOUNTER — Emergency Department (HOSPITAL_COMMUNITY)
Admission: EM | Admit: 2023-04-24 | Discharge: 2023-04-25 | Disposition: A | Discharge status: Home / Self Care | Payer: Medicaid Other | Attending: Emergency Medicine | Admitting: Emergency Medicine

## 2023-04-24 ENCOUNTER — Emergency Department (HOSPITAL_COMMUNITY): Payer: Medicaid Other

## 2023-04-24 ENCOUNTER — Other Ambulatory Visit: Payer: Self-pay

## 2023-04-24 ENCOUNTER — Encounter (HOSPITAL_COMMUNITY): Payer: Self-pay

## 2023-04-24 DIAGNOSIS — M1711 Unilateral primary osteoarthritis, right knee: Secondary | ICD-10-CM | POA: Diagnosis not present

## 2023-04-24 DIAGNOSIS — M25421 Effusion, right elbow: Secondary | ICD-10-CM

## 2023-04-24 DIAGNOSIS — R195 Other fecal abnormalities: Secondary | ICD-10-CM | POA: Diagnosis not present

## 2023-04-24 DIAGNOSIS — S2241XD Multiple fractures of ribs, right side, subsequent encounter for fracture with routine healing: Secondary | ICD-10-CM

## 2023-04-24 DIAGNOSIS — K760 Fatty (change of) liver, not elsewhere classified: Secondary | ICD-10-CM | POA: Diagnosis not present

## 2023-04-24 DIAGNOSIS — Y990 Civilian activity done for income or pay: Secondary | ICD-10-CM | POA: Insufficient documentation

## 2023-04-24 DIAGNOSIS — S299XXD Unspecified injury of thorax, subsequent encounter: Secondary | ICD-10-CM | POA: Diagnosis present

## 2023-04-24 DIAGNOSIS — W19XXXD Unspecified fall, subsequent encounter: Secondary | ICD-10-CM

## 2023-04-24 DIAGNOSIS — K4020 Bilateral inguinal hernia, without obstruction or gangrene, not specified as recurrent: Secondary | ICD-10-CM | POA: Diagnosis not present

## 2023-04-24 LAB — COMPREHENSIVE METABOLIC PANEL
ALT: 17 U/L (ref 0–44)
AST: 21 U/L (ref 15–41)
Albumin: 4 g/dL (ref 3.5–5.0)
Alkaline Phosphatase: 62 U/L (ref 38–126)
Anion gap: 9 (ref 5–15)
BUN: 17 mg/dL (ref 8–23)
CO2: 25 mmol/L (ref 22–32)
Calcium: 9.2 mg/dL (ref 8.9–10.3)
Chloride: 102 mmol/L (ref 98–111)
Creatinine, Ser: 0.94 mg/dL (ref 0.61–1.24)
GFR, Estimated: >60 mL/min (ref >60)
Glucose, Bld: 106 mg/dL — ABNORMAL HIGH (ref 70–99)
Potassium: 4.3 mmol/L (ref 3.5–5.1)
Sodium: 136 mmol/L (ref 135–145)
Total Bilirubin: 1 mg/dL (ref 0.3–1.2)
Total Protein: 7.2 g/dL (ref 6.5–8.1)

## 2023-04-24 LAB — CBC WITH DIFFERENTIAL/PLATELET
Abs Immature Granulocytes: 0.04 10*3/uL (ref 0.00–0.07)
Basophils Absolute: 0.1 10*3/uL (ref 0.0–0.1)
Basophils Relative: 2 %
Eosinophils Absolute: 0.2 10*3/uL (ref 0.0–0.5)
Eosinophils Relative: 4 %
HCT: 49.9 % (ref 39.0–52.0)
Hemoglobin: 16.4 g/dL (ref 13.0–17.0)
Immature Granulocytes: 1 %
Lymphocytes Relative: 43 %
Lymphs Abs: 2.6 10*3/uL (ref 0.7–4.0)
MCH: 29.3 pg (ref 26.0–34.0)
MCHC: 32.9 g/dL (ref 30.0–36.0)
MCV: 89.3 fL (ref 80.0–100.0)
Monocytes Absolute: 0.7 10*3/uL (ref 0.1–1.0)
Monocytes Relative: 12 %
Neutro Abs: 2.3 10*3/uL (ref 1.7–7.7)
Neutrophils Relative %: 38 %
Platelets: 247 10*3/uL (ref 150–400)
RBC: 5.59 MIL/uL (ref 4.22–5.81)
RDW: 13.8 % (ref 11.5–15.5)
WBC: 5.9 10*3/uL (ref 4.0–10.5)
nRBC: 0 % (ref 0.0–0.2)

## 2023-04-24 LAB — APTT: aPTT: 28 s (ref 24–36)

## 2023-04-24 LAB — I-STAT CHEM 8, ED
BUN: 16 mg/dL (ref 8–23)
Calcium, Ion: 1.12 mmol/L — ABNORMAL LOW (ref 1.15–1.40)
Chloride: 102 mmol/L (ref 98–111)
Creatinine, Ser: 0.9 mg/dL (ref 0.61–1.24)
Glucose, Bld: 108 mg/dL — ABNORMAL HIGH (ref 70–99)
HCT: 50 % (ref 39.0–52.0)
Hemoglobin: 17 g/dL (ref 13.0–17.0)
Potassium: 4.1 mmol/L (ref 3.5–5.1)
Sodium: 137 mmol/L (ref 135–145)
TCO2: 23 mmol/L (ref 22–32)

## 2023-04-24 LAB — PROTIME-INR
INR: 1 (ref 0.8–1.2)
Prothrombin Time: 13.4 s (ref 11.4–15.2)

## 2023-04-24 MED ORDER — IOHEXOL 300 MG/ML  SOLN
100.0000 mL | Freq: Once | INTRAMUSCULAR | Status: AC | PRN
  Administered 2023-04-24: 100 mL via INTRAVENOUS

## 2023-04-24 NOTE — ED Triage Notes (Addendum)
Patient stated he fell at work 10 days ago onto stones. Complaining of right arm pain and right upper abdominal pain. Was told he broke his right elbow. Urgent care sent him here for further scans. He stated it is painful to take a deep breath. Complaining of black stools. Stated he does not think he takes a blood thinner.

## 2023-04-24 NOTE — ED Provider Notes (Signed)
[facility]ENC[facility] Emergency Department Provider Note MRN:  [id number]  Arrival date & time: 04/24/23     Chief Complaint   Fall   History of Present Illness   Brandon Clark is a 82 y.o. year-old male presents to the ED with chief complaint of fall that occurred about 11 days ago.  He states that he continues to have some right flank pain.  Thinks that his belly is larger than normal.  Had reported some dark stools, which come intermittently, but are currently resolved.  Also states that he has and injury to his right elbow from the fall.  History provided by patient. Arabic interpreter used with [employer]  Review of Systems  Pertinent positive and negative review of systems noted in HPI.    Physical Exam   Vitals:   04/24/23 1526 04/24/23 2026  BP: (!) 145/105 (!) 144/97  Pulse: 88 (!) 57  Resp: 18 18  Temp: 98.3 F (36.8 C) 98.4 F (36.9 C)  SpO2: 96% 98%    CONSTITUTIONAL:  well-appearing, NAD NEURO:  Alert and oriented x 3, CN 3-12 grossly intact EYES:  eyes equal and reactive ENT/NECK:  Supple, no stridor  CARDIO:  normal rate, regular rhythm, appears well-perfused  PULM:  No respiratory distress, CTA, right inferior rib tenderness GI/GU:  non-distended, no focal abdominal tenderness MSK/SPINE:  No gross deformities, no edema, moves all extremities  SKIN:  no rash, atraumatic   *Additional and/or pertinent findings included in MDM below  Diagnostic and Interventional Summary    EKG Interpretation Date/Time:    Ventricular Rate:    PR Interval:    QRS Duration:    QT Interval:    QTC Calculation:   R Axis:      Text Interpretation:         Labs Reviewed  COMPREHENSIVE METABOLIC PANEL - Abnormal; Notable for the following components:      Result Value   Glucose, Bld 106 (*)    All other components within normal limits  I-STAT CHEM 8, ED - Abnormal; Notable for the following components:   Glucose, Bld 108  (*)    Calcium, Ion 1.12 (*)    All other components within normal limits  CBC WITH DIFFERENTIAL/PLATELET  APTT  PROTIME-INR    CT Head Wo Contrast  Final Result    CT Cervical Spine Wo Contrast  Final Result    CT ABDOMEN PELVIS W CONTRAST  Final Result    DG ELBOW COMPLETE RIGHT (3+VIEW)  Final Result    DG Knee 2 Views Right  Final Result      Medications  iohexol (OMNIPAQUE) 300 MG/ML solution 100 mL (100 mLs Intravenous Contrast Given 04/24/23 1801)     Procedures  /  Critical Care Procedures  ED Course and Medical Decision Making  I have reviewed the triage vital signs, the nursing notes, and pertinent available records from the EMR.  Social Determinants Affecting Complexity of Care: Patient has no clinically significant social determinants affecting this chief complaint..   ED Course:    Medical Decision Making Patient here with a fall that occurred about 10 days ago.  Has been seen by UC and was sent for additional imaging.  He looks well Imaging ordered in triage.  Problems Addressed: Bilateral inguinal hernia without obstruction or gangrene, recurrence not specified: undiagnosed new problem with uncertain prognosis    Details: No obstruction or strangulation.  Recommend outpatient follow-up. Closed fracture of multiple ribs of right side  with routine healing, subsequent encounter: self-limited or minor problem    Details: Not hypoxic.  Pain is tolerable.  Will have him follow-up with general surgery in trauma clinic Dark stools: undiagnosed new problem with uncertain prognosis    Details: Has been intermittent, but not currently having symptoms.  HGB is stable.  Recommend GI follow-up Elbow joint effusion, right: chronic illness or injury    Details: Old effusion seen.  [facility] notes (that patient had with him) say that he was effectively cleared by ortho. Hepatic steatosis: undiagnosed new problem with uncertain prognosis    Details: Recommend PCP  follow-up Osteoarthritis of right knee, unspecified osteoarthritis type: chronic illness or injury    Details: Advised ortho follow-up         Consultants: No consultations were needed in caring for this patient.   Treatment and Plan: I considered admission due to patient's initial presentation, but after considering the examination and diagnostic results, patient will not require admission and can be discharged with outpatient follow-up.    Final Clinical Impressions(s) / ED Diagnoses     ICD-10-CM   1. Fall, subsequent encounter  W19.XXXD     2. Hepatic steatosis  K76.0     3. Bilateral inguinal hernia without obstruction or gangrene, recurrence not specified  K40.20     4. Elbow joint effusion, right  M25.421     5. Osteoarthritis of right knee, unspecified osteoarthritis type  M17.11     6. Dark stools  R19.5     7. Closed fracture of multiple ribs of right side with routine healing, subsequent encounter  S22.41XD       ED Discharge Orders     None         Discharge Instructions Discussed with and Provided to Patient:    Discharge Instructions      No emergent finding was  discovered on your CTs scans or blood work today.  Please follow-up with the doctors listed.      Roxy Horseman, PA-C 04/24/23 Arletha Grippe    Ernie Avena, MD 04/25/23 [phone]

## 2023-04-24 NOTE — Discharge Instructions (Signed)
No emergent finding was  discovered on your CTs scans or blood work today.  Please follow-up with the doctors listed.

## 2023-04-24 NOTE — ED Provider Triage Note (Signed)
Emergency Medicine Provider Triage Evaluation Note  DILLAN TOURVILLE , a 82 y.o. male  was evaluated in triage.  Pt complains of fall.  Review of Systems  Positive:  Negative:   Physical Exam  BP (!) 145/105 (BP Location: Right Arm)   Pulse 88   Temp 98.3 F (36.8 C) (Oral)   Resp 18   Ht 6' (1.829 m)   Wt 116 kg   SpO2 96%   BMI 34.68 kg/m  Gen:   Awake, no distress   Resp:  Normal effort  MSK:   Moves extremities without difficulty  Other:    Medical Decision Making  Medically screening exam initiated at 3:55 PM.  Appropriate orders placed.  Ayomide [name] Massengill was informed that the remainder of the evaluation will be completed by another provider, this initial triage assessment does not replace that evaluation, and the importance of remaining in the ED until their evaluation is complete.  Multiple concerns. Mostly concerned about elbow pain and knee pain after fall 10 days ago from standing height. Patient with limp and cane at baseline. States that he hit his head and loss consciousness for a couple of seconds. Patient was able to ambulate after that. No seizures. No blood thinners.   Also concerned about "strange" appearance of abdomen over the past week. To me it looks like hernias - patient stating that it feels weird. Endorses dark stool. Denies nausea, vomiting, diarrhea, dysuria.     Dorthy Cooler, [place] 04/24/23 1605

## 2023-07-14 IMAGING — MR MR ANKLE*L* W/O CM
5 series · 39 of 40 positions shown · non-contrast
Comparison: X-ray foot 04/26/2021; X-ray ankle 09/11/2019.

CLINICAL DATA: Chronic left medial ankle pain radiating to knee
with swelling in left foot, and notes numbness and weakness for 2-3
years ago. Clinical concern for ankle arthritis.

EXAM:
MRI OF THE LEFT ANKLE WITHOUT CONTRAST
TECHNIQUE: Multiplanar, multisequence MR imaging of the ankle was performed. No
intravenous contrast was administered.

[Series 4: T2 fat-sat · axial · 3.5mm · 0.56mm/px · z∈[-103,+40]mm · 8 of 35 slices shown (1 of 2)]
[im 1/35]
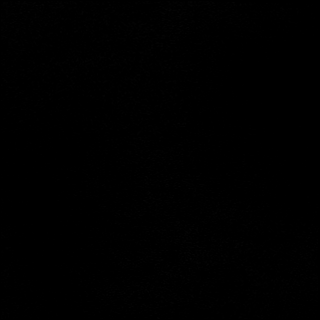
[im 5/35]
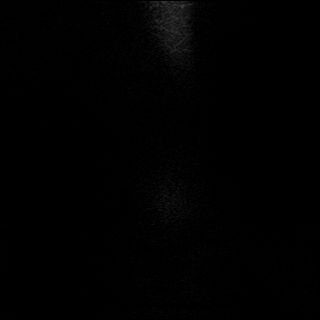
[im 10/35]
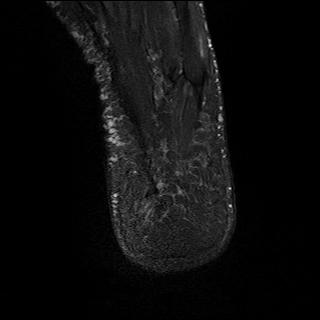
[im 15/35]
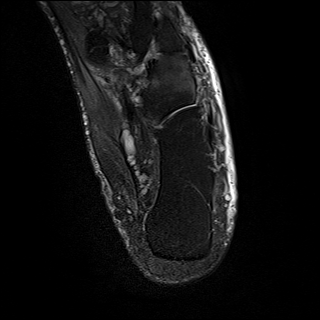
[im 20/35]
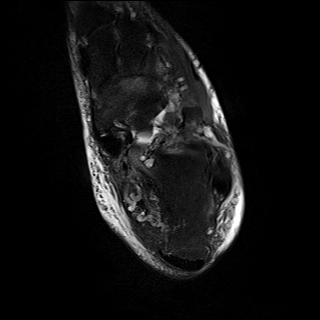
[im 25/35]
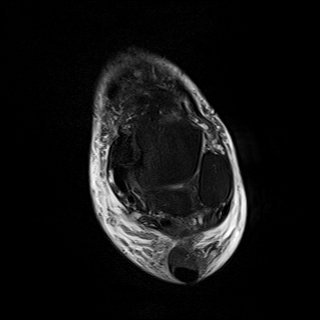
[im 30/35]
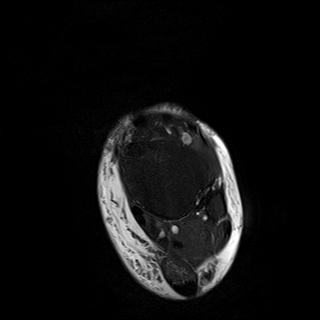
[im 35/35]
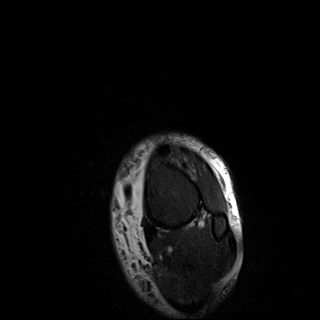

[Series 5: PD fat-sat · axial · 3.5mm · 0.56mm/px · z∈[-105,+38]mm · 9 of 35 slices shown]
[im 1/35]
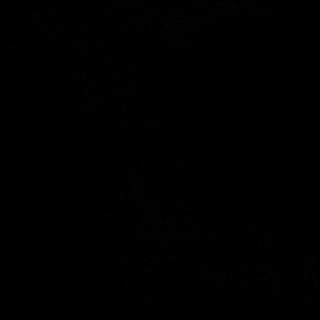
[im 5/35]
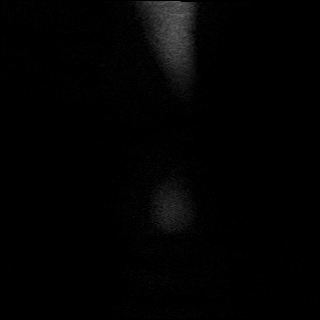
[im 9/35]
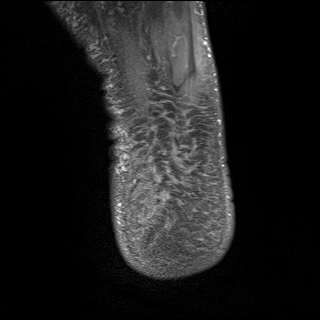
[im 13/35]
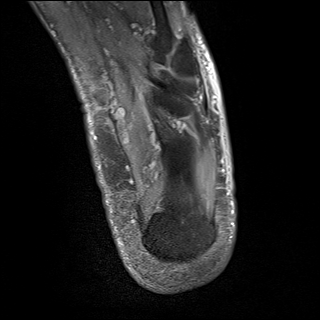
[im 18/35]
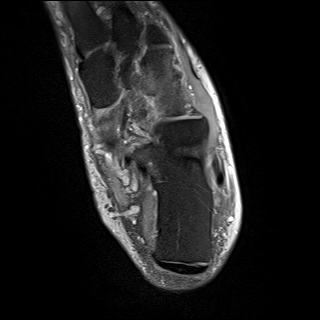
[im 22/35]
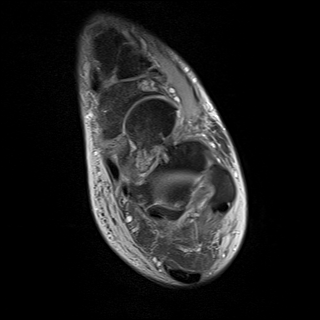
[im 26/35]
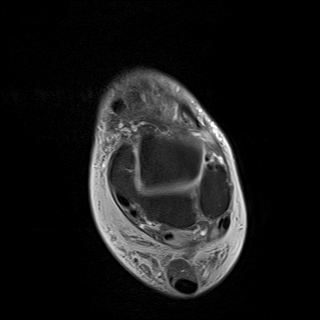
[im 30/35]
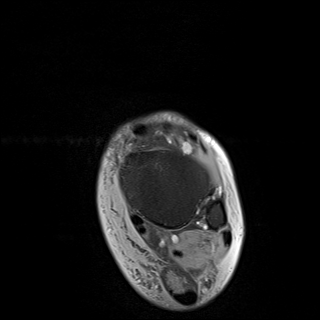
[im 35/35]
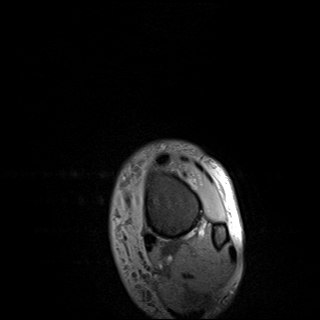

[Series 6: T1 · sagittal · 4.5mm · 0.59mm/px · 6 of 24 slices shown]
[im 1/24]
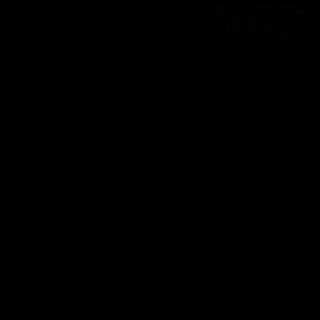
[im 5/24]
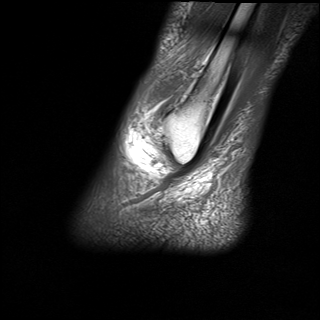
[im 10/24]
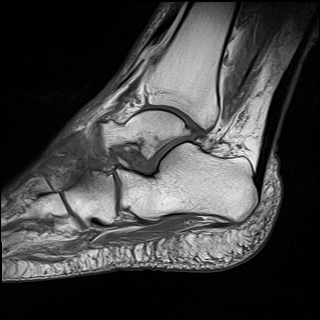
[im 14/24]
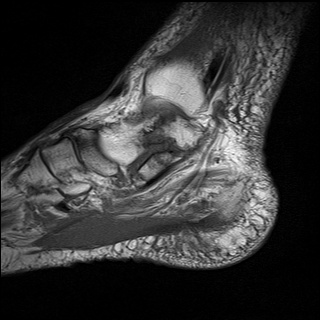
[im 19/24]
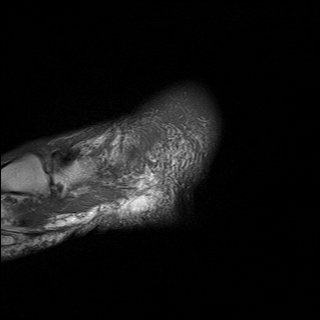
[im 24/24]
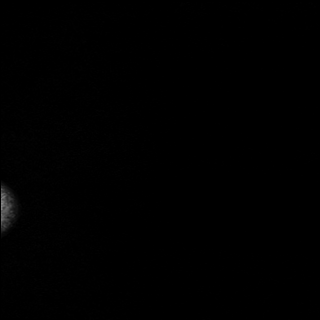

[Series 7: STIR · sagittal · 4.5mm · 0.39mm/px · 5 of 24 slices shown]
[im 1/24]
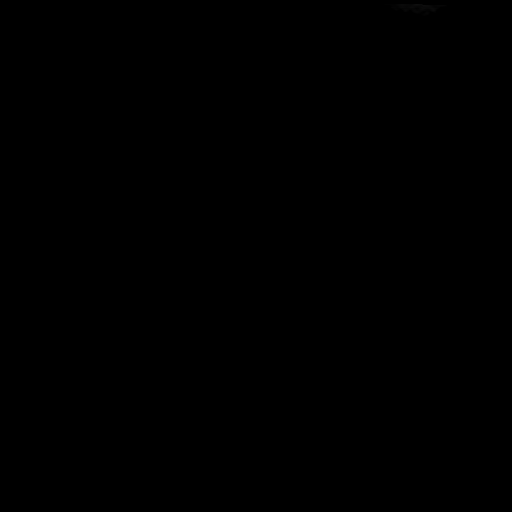
[im 5/24]
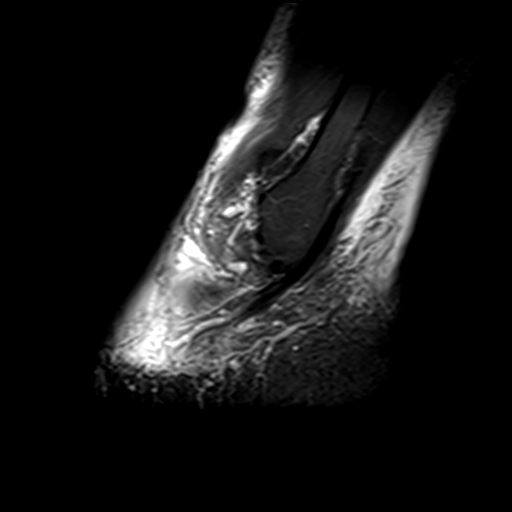
[im 10/24]
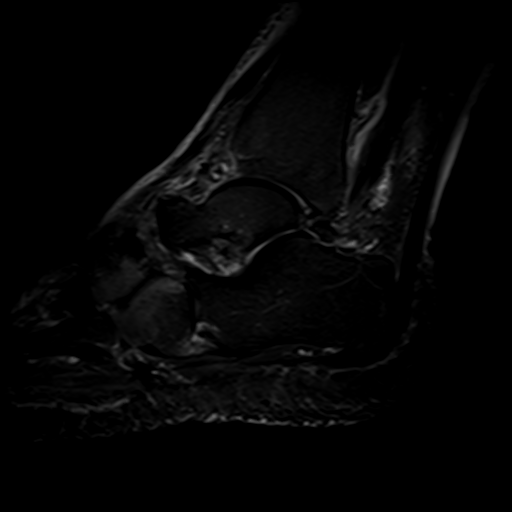
[im 14/24]
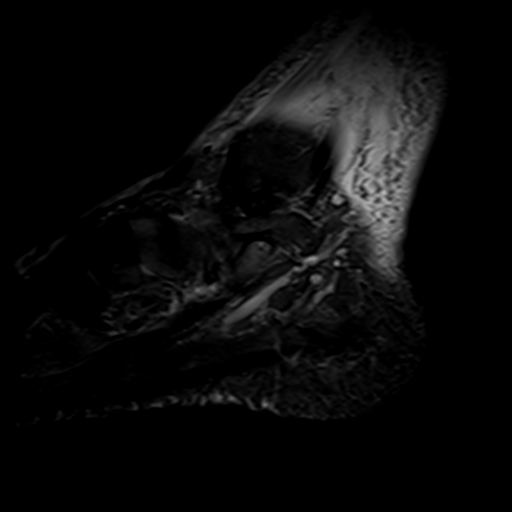
[im 19/24]
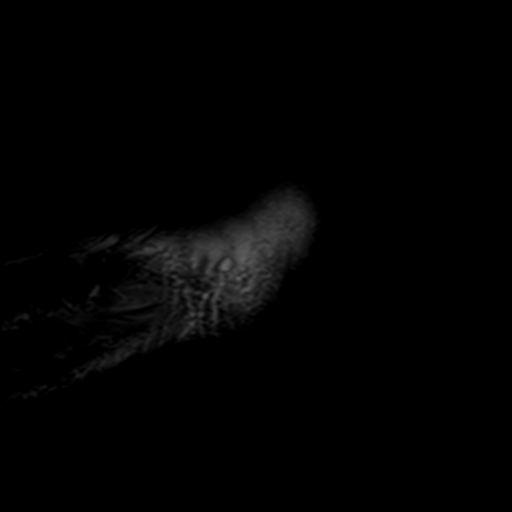

[Series 8: T2 fat-sat · coronal · 3.5mm · 0.56mm/px · 11 of 45 slices shown (2 of 2)]
[im 1/45]
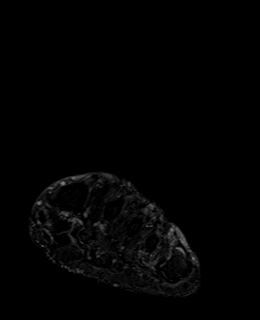
[im 5/45]
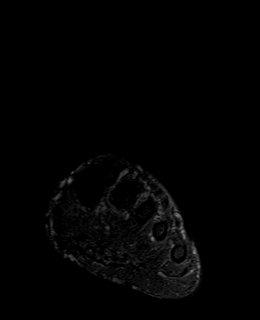
[im 9/45]
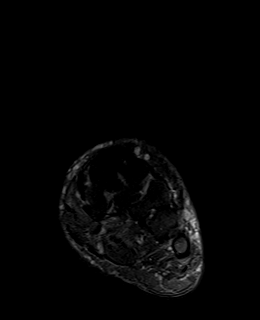
[im 14/45]
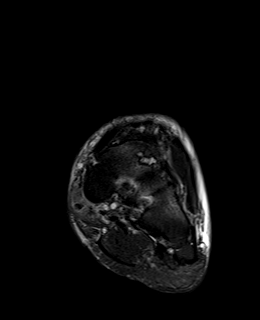
[im 18/45]
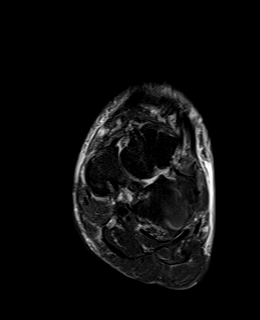
[im 23/45]
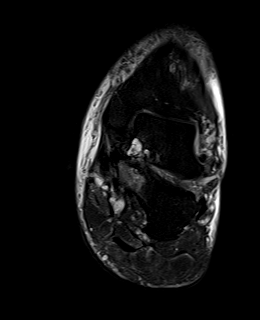
[im 27/45]
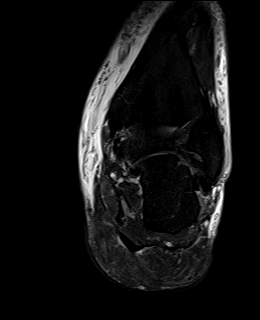
[im 31/45]
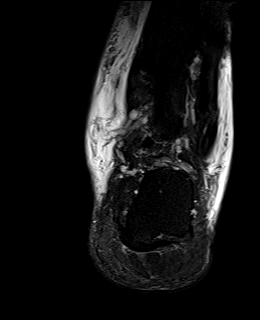
[im 36/45]
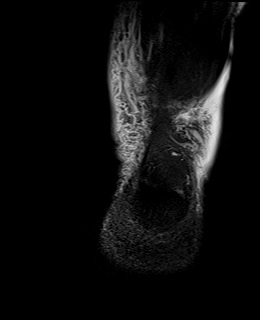
[im 40/45]
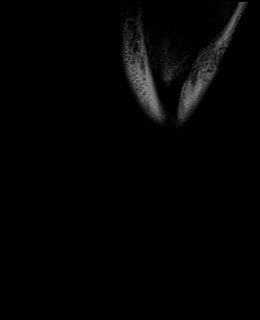
[im 45/45]
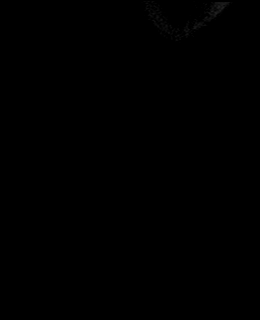

[39 of 40 positions shown; findings below may reference images not displayed]

FINDINGS: TENDONS

Peroneal: Peroneal longus tendon intact. Peroneal brevis intact.

Posteromedial: Posterior tibial tendon intact. Flexor hallucis
longus tendon intact. Flexor digitorum longus tendon intact.

Anterior: Tibialis anterior tendon intact. Extensor hallucis longus
tendon intact Extensor digitorum longus tendon intact.

Achilles:  Intact.

Plantar Fascia: Intact.

LIGAMENTS

Lateral: Anterior talofibular ligament intact. Calcaneofibular
ligament intact. Posterior talofibular ligament intact. Anterior and
posterior tibiofibular ligaments intact.

Medial: Deltoid ligament intact. Spring ligament intact.

CARTILAGE

Ankle Joint: No joint effusion. Normal ankle mortise. No chondral
defect.

Subtalar Joints/Sinus Tarsi: Normal subtalar joints. No subtalar
joint effusion. Normal sinus tarsi.

Bones: Subcortical extra-articular bone marrow edema and cystic
changes involving the lateral talus and lateral calcaneus as can be
seen with posterolateral ankle impingement. Bone marrow edema
throughout the navicular and proximal aspect of the lateral
cuneiform and cuboid which may reflect stress reaction versus early
changes of Charcot arthropathy.

Soft Tissue: No fluid collection or hematoma. Muscles are normal
without edema or atrophy. Tarsal tunnel is normal. Generalized soft
tissue edema circumferentially around the ankle and lower leg likely
reactive.
IMPRESSION: 1. Subcortical extra-articular bone marrow edema and cystic changes
involving the lateral talus and lateral calcaneus as can be seen
with posterolateral ankle impingement.
2. Bone marrow edema throughout the navicular and proximal aspect of
the lateral cuneiform and cuboid which may reflect stress reaction
versus early changes of Charcot arthropathy.

## 2023-10-27 DIAGNOSIS — E785 Hyperlipidemia, unspecified: Secondary | ICD-10-CM | POA: Insufficient documentation

## 2023-12-19 ENCOUNTER — Other Ambulatory Visit: Payer: Self-pay

## 2023-12-19 ENCOUNTER — Emergency Department (HOSPITAL_BASED_OUTPATIENT_CLINIC_OR_DEPARTMENT_OTHER)

## 2023-12-19 ENCOUNTER — Encounter (HOSPITAL_BASED_OUTPATIENT_CLINIC_OR_DEPARTMENT_OTHER): Payer: Self-pay

## 2023-12-19 ENCOUNTER — Emergency Department (HOSPITAL_BASED_OUTPATIENT_CLINIC_OR_DEPARTMENT_OTHER)
Admission: EM | Admit: 2023-12-19 | Discharge: 2023-12-19 | Disposition: A | Discharge status: Home / Self Care | Attending: Emergency Medicine | Admitting: Emergency Medicine

## 2023-12-19 DIAGNOSIS — I10 Essential (primary) hypertension: Secondary | ICD-10-CM | POA: Diagnosis not present

## 2023-12-19 DIAGNOSIS — M25562 Pain in left knee: Secondary | ICD-10-CM | POA: Diagnosis not present

## 2023-12-19 DIAGNOSIS — S20212A Contusion of left front wall of thorax, initial encounter: Secondary | ICD-10-CM | POA: Diagnosis not present

## 2023-12-19 DIAGNOSIS — W19XXXA Unspecified fall, initial encounter: Secondary | ICD-10-CM | POA: Insufficient documentation

## 2023-12-19 DIAGNOSIS — R1012 Left upper quadrant pain: Secondary | ICD-10-CM | POA: Diagnosis not present

## 2023-12-19 DIAGNOSIS — Z7982 Long term (current) use of aspirin: Secondary | ICD-10-CM | POA: Insufficient documentation

## 2023-12-19 DIAGNOSIS — R0789 Other chest pain: Secondary | ICD-10-CM | POA: Diagnosis present

## 2023-12-19 DIAGNOSIS — M25561 Pain in right knee: Secondary | ICD-10-CM | POA: Insufficient documentation

## 2023-12-19 LAB — COMPREHENSIVE METABOLIC PANEL WITH GFR
ALT: 18 U/L (ref 0–44)
AST: 21 U/L (ref 15–41)
Albumin: 4.2 g/dL (ref 3.5–5.0)
Alkaline Phosphatase: 67 U/L (ref 38–126)
Anion gap: 11 (ref 5–15)
BUN: 17 mg/dL (ref 8–23)
CO2: 25 mmol/L (ref 22–32)
Calcium: 9.4 mg/dL (ref 8.9–10.3)
Chloride: 101 mmol/L (ref 98–111)
Creatinine, Ser: 0.98 mg/dL (ref 0.61–1.24)
GFR, Estimated: >60 mL/min (ref >60)
Glucose, Bld: 118 mg/dL — ABNORMAL HIGH (ref 70–99)
Potassium: 3.9 mmol/L (ref 3.5–5.1)
Sodium: 137 mmol/L (ref 135–145)
Total Bilirubin: 0.5 mg/dL (ref 0.0–1.2)
Total Protein: 6.7 g/dL (ref 6.5–8.1)

## 2023-12-19 LAB — CBC WITH DIFFERENTIAL/PLATELET
Abs Immature Granulocytes: 0.04 10*3/uL (ref 0.00–0.07)
Basophils Absolute: 0.1 10*3/uL (ref 0.0–0.1)
Basophils Relative: 1 %
Eosinophils Absolute: 0.2 10*3/uL (ref 0.0–0.5)
Eosinophils Relative: 3 %
HCT: 48.3 % (ref 39.0–52.0)
Hemoglobin: 16.5 g/dL (ref 13.0–17.0)
Immature Granulocytes: 1 %
Lymphocytes Relative: 45 %
Lymphs Abs: 2.6 10*3/uL (ref 0.7–4.0)
MCH: 29.6 pg (ref 26.0–34.0)
MCHC: 34.2 g/dL (ref 30.0–36.0)
MCV: 86.6 fL (ref 80.0–100.0)
Monocytes Absolute: 0.7 10*3/uL (ref 0.1–1.0)
Monocytes Relative: 11 %
Neutro Abs: 2.3 10*3/uL (ref 1.7–7.7)
Neutrophils Relative %: 39 %
Platelets: 209 10*3/uL (ref 150–400)
RBC: 5.58 MIL/uL (ref 4.22–5.81)
RDW: 13.8 % (ref 11.5–15.5)
WBC: 5.9 10*3/uL (ref 4.0–10.5)
nRBC: 0 % (ref 0.0–0.2)

## 2023-12-19 LAB — URINALYSIS, ROUTINE W REFLEX MICROSCOPIC
Bilirubin Urine: NEGATIVE
Glucose, UA: NEGATIVE mg/dL
Hgb urine dipstick: NEGATIVE
Ketones, ur: NEGATIVE mg/dL
Leukocytes,Ua: NEGATIVE
Nitrite: NEGATIVE
Protein, ur: NEGATIVE mg/dL
Specific Gravity, Urine: 1.021 (ref 1.005–1.030)
pH: 5.5 (ref 5.0–8.0)

## 2023-12-19 LAB — LIPASE, BLOOD: Lipase: 23 U/L (ref 11–51)

## 2023-12-19 LAB — TROPONIN T, HIGH SENSITIVITY: Troponin T High Sensitivity: <15 ng/L (ref <19)

## 2023-12-19 MED ORDER — LIDOCAINE 5 % EX PTCH: 1.0000 | MEDICATED_PATCH | CUTANEOUS | 0 refills | Status: DC

## 2023-12-19 MED ORDER — NAPROXEN 500 MG PO TABS: 500.0000 mg | ORAL_TABLET | Freq: Two times a day (BID) | ORAL | 0 refills | Status: DC

## 2023-12-19 MED ORDER — IOHEXOL 300 MG/ML  SOLN
100.0000 mL | Freq: Once | INTRAMUSCULAR | Status: AC | PRN
  Administered 2023-12-19: 100 mL via INTRAVENOUS

## 2023-12-19 MED ORDER — HYDROCODONE-ACETAMINOPHEN 5-325 MG PO TABS
1.0000 | ORAL_TABLET | Freq: Once | ORAL | Status: AC
  Administered 2023-12-19: 1 via ORAL
  Filled 2023-12-19: qty 1

## 2023-12-19 NOTE — ED Provider Notes (Signed)
 [place] [facility] DEPARTMENT AT [facility] Provider Note   CSN: [id number] Arrival date & time: 12/19/23  1729     Patient presents with: Fall and Abdominal Pain   Brandon Clark is a 83 y.o. male.  With past medical history of obstructive sleep apnea, hypertension, hyperlipidemia is presenting to emergency room with complaint of fall.  Patient reports that yesterday evening he had a mechanical fall secondary to knee pain.  He reports that he fell forward landing on his knees stomach and the left side of his chest.  Today he has had left lower chest wall pain that is worse with coughing or touching the area.  Also notes left upper abdominal pain and feels his abdomen is more distended than normal.  Notes bilateral knee pain but is able to ambulate with steady gait.  He is not on blood thinner.  When he fell he did not hit his head or lose consciousness.  He denies headache or neck pain.  Interpreter offered and used for conversation.     Fall Associated symptoms include abdominal pain.  Abdominal Pain      Prior to Admission medications   Medication Sig Start Date End Date Taking? Authorizing Provider  acetaminophen  (TYLENOL ) 500 MG tablet Take 500-1,000 mg by mouth every 6 (six) hours as needed.    [provider]  albuterol  (VENTOLIN  HFA) 108 (90 Base) MCG/ACT inhaler Inhale 1-2 puffs into the lungs every 4 (four) hours as needed for wheezing or shortness of breath. 06/09/21   Ethlyn Herd, MD  aspirin  EC 325 MG tablet Take 1 tablet (325 mg total) by mouth daily. 06/09/17   Dansie, William, PA-C  azithromycin  (ZITHROMAX ) 250 MG tablet Take 1 tablet (250 mg total) by mouth daily. Take first 2 tablets together, then 1 every day until finished. 01/18/22   White, Maybelle Spatz, NP  benzonatate  (TESSALON ) 200 MG capsule Take 1 capsule (200 mg total) by mouth 3 (three) times daily as needed for cough. 01/10/22   Raspet, Erin K, PA-C  cetirizine  (ZYRTEC ) 5 MG  tablet Take 1 tablet (5 mg total) by mouth daily. 06/01/22 07/01/22  Starlene Eaton, FNP  dicyclomine  (BENTYL ) 20 MG tablet Take 1 tablet (20 mg total) by mouth 2 (two) times daily. 01/18/22   White, Maybelle Spatz, NP  donepezil  (ARICEPT ) 10 MG tablet Take 1 tablet (10 mg total) by mouth at bedtime. 06/12/18   Newlin, Enobong, MD  finasteride  (PROSCAR ) 5 MG tablet Take 1 tablet (5 mg total) by mouth daily. 10/16/19   Newlin, Enobong, MD  fluticasone  (FLONASE ) 50 MCG/ACT nasal spray Place 2 sprays into both nostrils daily. 01/10/22   Raspet, Erin K, PA-C  hydrochlorothiazide  (HYDRODIURIL ) 12.5 MG tablet Take 1 tablet (12.5 mg total) by mouth daily. 06/12/18   Newlin, Enobong, MD  meclizine  (ANTIVERT ) 12.5 MG tablet Take 1 tablet (12.5 mg total) by mouth 3 (three) times daily as needed for dizziness. 03/18/22   Gretel Leaven, PA-C  methocarbamol  (ROBAXIN ) 500 MG tablet Take 500 mg by mouth every 8 (eight) hours as needed for muscle spasms. 05/13/16   [provider]  predniSONE  (DELTASONE ) 10 MG tablet Take 1 tablet (10 mg total) by mouth in the morning, at noon, and at bedtime. 03/18/22   Gretel Leaven, PA-C  simvastatin  (ZOCOR ) 10 MG tablet Take 1 tablet (10 mg total) by mouth at bedtime. 10/16/19   Newlin, Enobong, MD  Spacer/Aero-Holding Chambers (AEROCHAMBER PLUS) inhaler Use with inhaler 06/09/21   Ethlyn Herd,  MD  tamsulosin  (FLOMAX ) 0.4 MG CAPS capsule Take 1 capsule (0.4 mg total) by mouth daily. 01/18/22   White, Maybelle Spatz, NP  traZODone  (DESYREL ) 50 MG tablet Take 1 tablet (50 mg total) by mouth at bedtime as needed for sleep. 10/16/19   Newlin, Enobong, MD    Allergies: Ondansetron     Review of Systems  Gastrointestinal:  Positive for abdominal pain.    Updated Vital Signs BP (!) 144/112 (BP Location: Right Arm)   Pulse (!) 102   Temp (!) 96.9 F (36.1 C)   Resp 18   Ht 6' (1.829 m)   Wt 125.6 kg   SpO2 98%   BMI 37.57 kg/m   Physical Exam Vitals and nursing note  reviewed.  Constitutional:      General: He is not in acute distress.    Appearance: He is not toxic-appearing.  HENT:     Head: Normocephalic and atraumatic.   Eyes:     General: No scleral icterus.    Conjunctiva/sclera: Conjunctivae normal.    Cardiovascular:     Rate and Rhythm: Normal rate and regular rhythm.     Pulses: Normal pulses.     Heart sounds: Normal heart sounds.  Pulmonary:     Effort: Pulmonary effort is normal. No respiratory distress.     Breath sounds: Normal breath sounds.     Comments: Reproducible left lower chest wall pain. Chest:     Chest wall: Tenderness present.  Abdominal:     General: Abdomen is flat. Bowel sounds are normal. There is distension.     Palpations: Abdomen is soft.     Tenderness: There is abdominal tenderness.     Comments: Left upper quadrant abdominal pain, tender to palpation.   Musculoskeletal:     Right lower leg: No edema.     Left lower leg: No edema.     Comments: Mild tenderness to palpation over bilateral knees.  No lower extremity swelling or edema.   Skin:    General: Skin is warm and dry.     Findings: No lesion.   Neurological:     General: No focal deficit present.     Mental Status: He is alert and oriented to person, place, and time. Mental status is at baseline.     (all labs ordered are listed, but only abnormal results are displayed) Labs Reviewed  COMPREHENSIVE METABOLIC PANEL WITH GFR - Abnormal; Notable for the following components:      Result Value   Glucose, Bld 118 (*)    All other components within normal limits  LIPASE, BLOOD  CBC WITH DIFFERENTIAL/PLATELET  URINALYSIS, ROUTINE W REFLEX MICROSCOPIC  TROPONIN T, HIGH SENSITIVITY    EKG: None  Radiology: CT CHEST ABDOMEN PELVIS W CONTRAST Result Date: 12/19/2023 CLINICAL DATA:  Polytrauma, blunt EXAM: CT CHEST, ABDOMEN, AND PELVIS WITH CONTRAST TECHNIQUE: Multidetector CT imaging of the chest, abdomen and pelvis was performed following  the standard protocol during bolus administration of intravenous contrast. RADIATION DOSE REDUCTION: This exam was performed according to the departmental dose-optimization program which includes automated exposure control, adjustment of the mA and/or kV according to patient size and/or use of iterative reconstruction technique. CONTRAST:  OMNIPAQUE  IOHEXOL  300 MG/ML  SOLN COMPARISON:  None Available. FINDINGS: CHEST: Cardiovascular: No aortic injury. The thoracic aorta is normal in caliber. The heart is normal in size. No significant pericardial effusion. Left anterior descending coronary artery calcification. Mediastinum/Nodes: No pneumomediastinum. No mediastinal hematoma. The esophagus is unremarkable.  Small hiatal hernia. The thyroid  is unremarkable. The central airways are patent. Nonspecific cm, likely benign, fluid density lesion along the azygous vein region (3:30). No mediastinal, hilar, or axillary lymphadenopathy. Lungs/Pleura: No focal consolidation. No pulmonary nodule. No pulmonary mass. No pulmonary contusion or laceration. No pneumatocele formation. No pleural effusion. No pneumothorax. No hemothorax. Musculoskeletal/Chest wall: No chest wall mass. No acute rib or sternal fracture. T5 densely sclerotic lesion likely represents a bone island. ABDOMEN / PELVIS: Hepatobiliary: Not enlarged. No focal lesion. No laceration or subcapsular hematoma. The gallbladder is otherwise unremarkable with no radio-opaque gallstones. No biliary ductal dilatation. Pancreas: Atrophic. Normal pancreatic contour. No main pancreatic duct dilatation. Spleen: Not enlarged. No focal lesion. No laceration, subcapsular hematoma, or vascular injury. Splenule noted. Adrenals/Urinary Tract: No nodularity bilaterally. Bilateral kidneys enhance symmetrically. No hydronephrosis. No contusion, laceration, or subcapsular hematoma. Fluid density lesion of the left kidney likely represents a simple renal cyst. Simple renal cysts,  in the absence of clinically indicated signs/symptoms, require no independent follow-up. No injury to the vascular structures or collecting systems. No hydroureter. The urinary bladder is unremarkable. On delayed imaging, there is no urothelial wall thickening and there are no filling defects in the opacified portions of the bilateral collecting systems or ureters. Stomach/Bowel: No small or large bowel wall thickening or dilatation. The appendix is unremarkable. Vasculature/Lymphatics: No abdominal aorta or iliac aneurysm. No active contrast extravasation or pseudoaneurysm. Mild atherosclerotic plaque. No abdominal, pelvic, inguinal lymphadenopathy. Reproductive: Prostate is unremarkable. Other: No simple free fluid ascites. No pneumoperitoneum. No hemoperitoneum. No mesenteric hematoma identified. No organized fluid collection. Musculoskeletal: No significant soft tissue hematoma. No acute pelvic fracture. No spinal fracture. Other ports and devices: None. IMPRESSION: 1. No acute intrathoracic, intra-abdominal, intrapelvic traumatic injury. 2. No acute fracture or traumatic malalignment of the thoracic or lumbar spine. 3. Aortic Atherosclerosis (ICD10-I70.0) including coronary artery calcifications. 4. Hiatal hernia. Electronically Signed   By: Morgane  Naveau M.D.   On: 12/19/2023 20:25   CT Head Wo Contrast Result Date: 12/19/2023 CLINICAL DATA:  Head trauma, minor (Age >= 65y) EXAM: CT HEAD WITHOUT CONTRAST TECHNIQUE: Contiguous axial images were obtained from the base of the skull through the vertex without intravenous contrast. RADIATION DOSE REDUCTION: This exam was performed according to the departmental dose-optimization program which includes automated exposure control, adjustment of the mA and/or kV according to patient size and/or use of iterative reconstruction technique. COMPARISON:  CT head 04/24/2023 FINDINGS: Brain: No evidence of large-territorial acute infarction. No parenchymal hemorrhage. No  mass lesion. No extra-axial collection. No mass effect or midline shift. No hydrocephalus. Basilar cisterns are patent. Vascular: No hyperdense vessel. Skull: No acute fracture or focal lesion. Sinuses/Orbits: Paranasal sinuses and mastoid air cells are clear. The orbits are unremarkable. Other: None. IMPRESSION: No acute intracranial abnormality. Electronically Signed   By: Morgane  Naveau M.D.   On: 12/19/2023 20:07   DG Knee Complete 4 Views Right Result Date: 12/19/2023 CLINICAL DATA:  Marvell Slider EXAM: RIGHT KNEE - COMPLETE 4+ VIEW COMPARISON:  04/24/2023 FINDINGS: Frontal, bilateral oblique, lateral views of the right knee are obtained. No fracture, subluxation, or dislocation. 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 No joint effusion. Anterior soft tissue swelling. IMPRESSION: 1. No acute fracture. 2. Mild 3 compartmental osteoarthritis. 3. Anterior soft tissue swelling. Electronically Signed   By: Bobbye Burrow M.D.   On: 12/19/2023 18:57   DG Knee Complete 4 Views Left Result Date: 12/19/2023 CLINICAL DATA:  Marvell Slider EXAM: LEFT KNEE - COMPLETE  4+ VIEW COMPARISON:  08/07/2019 FINDINGS: Frontal, bilateral oblique, and lateral views of the left knee are obtained on 5 images. No fracture, subluxation, or dislocation. Moderate 3 compartmental osteoarthritis most pronounced in the medial and patellofemoral compartments. No joint effusion. Subcutaneous edema within the prepatellar and infrapatellar soft tissues. IMPRESSION: 1. No acute displaced fracture. 2. 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 3. Anterior soft tissue swelling. Electronically Signed   By: Bobbye Burrow M.D.   On: 12/19/2023 18:56     Procedures   Medications Ordered in the ED  HYDROcodone -acetaminophen  (NORCO/VICODIN) 5-325 MG per tablet 1 tablet (has no administration in time range)                                    Medical Decision  Making Amount and/or Complexity of Data Reviewed Labs: ordered. Radiology: ordered.  Risk Prescription drug management.   This patient presents to the ED for concern of fall, this involves an extensive number of treatment options, and is a complaint that carries with it a high risk of complications and morbidity.  The differential diagnosis includes mildly, pneumothorax, ACS, rib fracture, liver failure/ ascites    Co morbidities that complicate the patient evaluation  HTN BPH   Lab Tests:  I personally interpreted labs.  The pertinent results include:   CBC without leukocytosis no anemia.  CMP without electrolyte abnormality.  Normal kidney and liver function.  Lipase 23 troponin <15.  UA negative   Imaging Studies ordered:  I ordered imaging studies including head CT, chest abdomen pelvis with contrast.  X-ray of right and left knee. I independently visualized and interpreted imaging which showed no acute findings I agree with the radiologist interpretation   Cardiac Monitoring: / EKG:  The patient was maintained on a cardiac monitor.     Problem List / ED Course / Critical interventions / Medication management  Patient presents emergency room with complaint of fall that occurred yesterday.  He notes a mechanical fall landing on his knees after an left side.  Since then he has had left-sided chest pain, left upper abdominal pain, bilateral knee pain.  He is able to ambulate with steady gait.  He does not think he hit his head but due to unwitnessed fall and history of dementia will obtain head CT to rule out intracranial abnormality.  Overall has reassuring lab work without any anemia or electrolyte abnormality.  He is alert and oriented x 4.  Moving extremities without difficulty.  Hemodynamically stable and well-appearing.  Patient's imaging was unremarkable here.  Pain is better after Norco.  He is ambulating with steady gait.  He is requesting discharge.  He has follow-up  with primary care.  He is given return precautions. I ordered medication including Norco for pain control. Reevaluation of the patient after these medicines showed that the patient improved I have reviewed the patients home medicines and have made adjustments as needed   Plan F/u w/ PCP in 2-3d to ensure resolution of sx.  Patient was given return precautions. Patient stable for discharge at this time.  Patient educated on sx/dx and verbalized understanding of plan. Return to ER w/ new or worsening sx.       Final diagnoses:  Fall, initial encounter  Contusion of left chest wall, initial encounter  Acute pain of both knees    ED Discharge Orders  Ordered    naproxen (NAPROSYN) 500 MG tablet  2 times daily        12/19/23 2039    lidocaine  (LIDODERM ) 5 %  Every 24 hours        12/19/23 2039               Doha Boling N, PA-C 12/19/23 2039    Tonya Fredrickson, MD 12/20/23 1050

## 2023-12-19 NOTE — ED Triage Notes (Signed)
 Arrives POV with complaints of sustaining a fall at work yesterday. Patient reports falling on his knees, and landing on his face. Patient also reports that now having abdomen/rib pain. Concerned that his stomach is becoming distended. Rates pain an 8/10.

## 2023-12-19 NOTE — ED Notes (Signed)
 Patient transported to CT

## 2023-12-19 NOTE — Discharge Instructions (Addendum)
 Call primary care to schedule follow-up for today's visit.  Turn to ER with new or worsening symptoms.  Take anti-inflammatory like naproxen and Tylenol  you can also use ice and heat over area of pain. You can use lidoderm  patch to left chest/abd.

## 2024-04-16 ENCOUNTER — Other Ambulatory Visit: Payer: Self-pay | Admitting: Orthopaedic Surgery

## 2024-04-17 ENCOUNTER — Other Ambulatory Visit: Payer: Self-pay | Admitting: Orthopaedic Surgery

## 2024-04-22 NOTE — Progress Notes (Signed)
 In person interpreter present  for appointment Patient has a history of dementia. Son did not stay in during appointment. Has hx of dementia. Difficult to tell if patient is confused due to language barrier. He is alert and oriented x4 at PAT.  COVID Vaccine Completed:  Date of COVID positive in last 90 days:  PCP - Erminio Sensing, MD Cardiologist - Marcello Lennox, MD LOV 03/26/24  Cardiac clearance by Dr. Lennox 12/25/23 in Epic  Ct- 12/19/23 Epic Chest x-ray - N/A EKG - 02/05/24 [facility] Stress Test - 03/27/24 [facility] ECHO - 03/19/24 [facility] Cardiac Cath - n/a Pacemaker/ICD device last checked:N/A Spinal Cord Stimulator:N/A  Bowel Prep - N/A  Sleep Study - patient denies CPAP -   Fasting Blood Sugar - boarderline per pt no meds or checks at home Checks Blood Sugar _____ times a day  Last dose of GLP1 agonist-  N/A GLP1 instructions:  Do not take after     Last dose of SGLT-2 inhibitors-  N/A SGLT-2 instructions:  Do not take after     Blood Thinner Instructions: N/A Last dose:   Time: Aspirin  Instructions:  Last Dose:  Activity level: Can perform activities of daily living without stopping and without symptoms of chest pain or shortness of breath. Ambulates with cane, also has wheel chair at home    Anesthesia review: HTN, OSA, vertigo, aortic dilation  Patient denies shortness of breath, fever, cough and chest pain at PAT appointment  Patient verbalized understanding of instructions that were given to them at the PAT appointment. Patient was also instructed that they will need to review over the PAT instructions again at home before surgery.

## 2024-04-23 ENCOUNTER — Other Ambulatory Visit: Payer: Self-pay

## 2024-04-23 ENCOUNTER — Encounter (HOSPITAL_COMMUNITY): Payer: Self-pay

## 2024-04-23 ENCOUNTER — Encounter (HOSPITAL_COMMUNITY)
Admission: RE | Admit: 2024-04-23 | Discharge: 2024-04-23 | Disposition: A | Discharge status: Home / Self Care | Payer: Worker's Compensation | Source: Ambulatory Visit | Attending: Orthopaedic Surgery | Admitting: Orthopaedic Surgery

## 2024-04-23 VITALS — BP 120/101 | HR 100 | Temp 98.2°F | Resp 20 | Ht 72.0 in | Wt 266.0 lb

## 2024-04-23 DIAGNOSIS — E785 Hyperlipidemia, unspecified: Secondary | ICD-10-CM | POA: Insufficient documentation

## 2024-04-23 DIAGNOSIS — I10 Essential (primary) hypertension: Secondary | ICD-10-CM | POA: Insufficient documentation

## 2024-04-23 DIAGNOSIS — Z01812 Encounter for preprocedural laboratory examination: Secondary | ICD-10-CM | POA: Insufficient documentation

## 2024-04-23 DIAGNOSIS — R7303 Prediabetes: Secondary | ICD-10-CM | POA: Diagnosis not present

## 2024-04-23 DIAGNOSIS — F0390 Unspecified dementia without behavioral disturbance: Secondary | ICD-10-CM | POA: Insufficient documentation

## 2024-04-23 DIAGNOSIS — Z01818 Encounter for other preprocedural examination: Secondary | ICD-10-CM | POA: Diagnosis present

## 2024-04-23 DIAGNOSIS — E669 Obesity, unspecified: Secondary | ICD-10-CM | POA: Insufficient documentation

## 2024-04-23 DIAGNOSIS — M1712 Unilateral primary osteoarthritis, left knee: Secondary | ICD-10-CM | POA: Diagnosis not present

## 2024-04-23 DIAGNOSIS — G4730 Sleep apnea, unspecified: Secondary | ICD-10-CM | POA: Insufficient documentation

## 2024-04-23 DIAGNOSIS — K219 Gastro-esophageal reflux disease without esophagitis: Secondary | ICD-10-CM | POA: Diagnosis not present

## 2024-04-23 DIAGNOSIS — R2689 Other abnormalities of gait and mobility: Secondary | ICD-10-CM | POA: Insufficient documentation

## 2024-04-23 DIAGNOSIS — Z6839 Body mass index (BMI) 39.0-39.9, adult: Secondary | ICD-10-CM | POA: Diagnosis not present

## 2024-04-23 HISTORY — DX: Unspecified osteoarthritis, unspecified site: M19.90

## 2024-04-23 HISTORY — DX: Unspecified dementia, unspecified severity, without behavioral disturbance, psychotic disturbance, mood disturbance, and anxiety: F03.90

## 2024-04-23 LAB — BASIC METABOLIC PANEL WITH GFR
Anion gap: 10 (ref 5–15)
BUN: 13 mg/dL (ref 8–23)
CO2: 27 mmol/L (ref 22–32)
Calcium: 9.2 mg/dL (ref 8.9–10.3)
Chloride: 100 mmol/L (ref 98–111)
Creatinine, Ser: 1.01 mg/dL (ref 0.61–1.24)
GFR, Estimated: >60 mL/min (ref >60)
Glucose, Bld: 134 mg/dL — ABNORMAL HIGH (ref 70–99)
Potassium: 4.1 mmol/L (ref 3.5–5.1)
Sodium: 137 mmol/L (ref 135–145)

## 2024-04-23 LAB — CBC
HCT: 51.7 % (ref 39.0–52.0)
Hemoglobin: 16.9 g/dL (ref 13.0–17.0)
MCH: 28.8 pg (ref 26.0–34.0)
MCHC: 32.7 g/dL (ref 30.0–36.0)
MCV: 88.2 fL (ref 80.0–100.0)
Platelets: 220 K/uL (ref 150–400)
RBC: 5.86 MIL/uL — ABNORMAL HIGH (ref 4.22–5.81)
RDW: 13.2 % (ref 11.5–15.5)
WBC: 5.3 K/uL (ref 4.0–10.5)
nRBC: 0 % (ref 0.0–0.2)

## 2024-04-23 LAB — SURGICAL PCR SCREEN
MRSA, PCR: NEGATIVE
Staphylococcus aureus: NEGATIVE

## 2024-04-23 NOTE — Patient Instructions (Signed)
 SURGICAL WAITING ROOM VISITATION  Patients having surgery or a procedure may have no more than 2 support people in the waiting area - these visitors may rotate.    Children under the age of 60 must have an adult with them who is not the patient.  Visitors with respiratory illnesses are discouraged from visiting and should remain at home.  If the patient needs to stay at the hospital during part of their recovery, the visitor guidelines for inpatient rooms apply. Pre-op nurse will coordinate an appropriate time for 1 support person to accompany patient in pre-op.  This support person may not rotate.    Please refer to the [facility] website for the visitor guidelines for Inpatients (after your surgery is over and you are in a regular room).    Your procedure is scheduled on: 04/30/24   Report to [facility] Main Entrance    Report to admitting at 10:00 AM   Call this number if you have problems the morning of surgery [phone]   Do not eat food :After Midnight.   After Midnight you may have the following liquids until 9:30 AM DAY OF SURGERY  Water Non-Citrus Juices (without pulp, NO RED-Apple, White grape, White cranberry) Black Coffee (NO MILK/CREAM OR CREAMERS, sugar ok)  Clear Tea (NO MILK/CREAM OR CREAMERS, sugar ok) regular and decaf                             Plain Jell-O (NO RED)                                           Fruit ices (not with fruit pulp, NO RED)                                     Popsicles (NO RED)                                                               Sports drinks like Gatorade (NO RED)     The day of surgery:  Drink ONE (1) Pre-Surgery G2 at 9:30 AM the morning of surgery. Drink in one sitting. Do not sip.  This drink was given to you during your hospital  pre-op appointment visit. Nothing else to drink after completing the  Pre-Surgery G2.          If you have questions, please contact your surgeon's office.   FOLLOW BOWEL  PREP AND ANY ADDITIONAL PRE OP INSTRUCTIONS YOU RECEIVED FROM YOUR SURGEON'S OFFICE!!!     Oral Hygiene is also important to reduce your risk of infection.                                    Remember - BRUSH YOUR TEETH THE MORNING OF SURGERY WITH YOUR REGULAR TOOTHPASTE  DENTURES WILL BE REMOVED PRIOR TO SURGERY PLEASE DO NOT APPLY Poly grip OR ADHESIVES!!!   Stop all vitamins and herbal supplements 7 days before surgery.  Take these medicines the morning of surgery with A SIP OF WATER: Tylenol , Omeprazole                                You may not have any metal on your body including jewelry, and body piercing             Do not wear lotions, powders, cologne, or deodorant              Men may shave face and neck.   Do not bring valuables to the hospital. [facility] IS NOT             RESPONSIBLE   FOR VALUABLES.   Contacts, glasses, dentures or bridgework may not be worn into surgery.  DO NOT BRING YOUR HOME MEDICATIONS TO THE HOSPITAL. PHARMACY WILL DISPENSE MEDICATIONS LISTED ON YOUR MEDICATION LIST TO YOU DURING YOUR ADMISSION IN THE HOSPITAL!    Patients discharged on the day of surgery will not be allowed to drive home.  Someone NEEDS to stay with you for the first 24 hours after anesthesia.   Special Instructions: Bring a copy of your healthcare power of attorney and living will documents the day of surgery if you haven't scanned them before.              Please read over the following fact sheets you were given: IF YOU HAVE QUESTIONS ABOUT YOUR PRE-OP INSTRUCTIONS PLEASE CALL [phone] [place].   If you received a COVID test during your pre-op visit  it is requested that you wear a mask when out in public, stay away from anyone that may not be feeling well and notify your surgeon if you develop symptoms. If you test positive for Covid or have been in contact with anyone that has tested positive in the last 10 days please notify you surgeon.      Pre-operative 4  CHG Bath Instructions  DYNA-Hex 4 Chlorhexidine  Gluconate 4% Solution Antiseptic 4 fl. oz   You can play a key role in reducing the risk of infection after surgery. Your skin needs to be as free of germs as possible. You can reduce the number of germs on your skin by washing with CHG (chlorhexidine  gluconate) soap before surgery. CHG is an antiseptic soap that kills germs and continues to kill germs even after washing.   DO NOT use if you have an allergy to chlorhexidine /CHG or antibacterial soaps. If your skin becomes reddened or irritated, stop using the CHG and notify one of our RNs at   Please shower with the CHG soap starting 4 days before surgery using the following schedule:     Please keep in mind the following:  DO NOT shave, including legs and underarms, starting the day of your first shower.   You may shave your face at any point before/day of surgery.  Place clean sheets on your bed the day you start using CHG soap. Use a clean washcloth (not used since being washed) for each shower. DO NOT sleep with pets once you start using the CHG.  CHG Shower Instructions:  If you choose to wash your hair and private area, wash first with your normal shampoo/soap.  After you use shampoo/soap, rinse your hair and body thoroughly to remove shampoo/soap residue.  Turn the water OFF and apply about 3 tablespoons (45 ml) of CHG soap to a CLEAN washcloth.  Apply CHG soap ONLY FROM YOUR  NECK DOWN TO YOUR TOES (washing for 3-5 minutes)  DO NOT use CHG soap on face, private areas, open wounds, or sores.  Pay special attention to the area where your surgery is being performed.  If you are having back surgery, having someone wash your back for you may be helpful. Wait 2 minutes after CHG soap is applied, then you may rinse off the CHG soap.  Pat dry with a clean towel  Put on clean clothes/pajamas   If you choose to wear lotion, please use ONLY the CHG-compatible lotions on the back of this paper.      Additional instructions for the day of surgery: DO NOT APPLY any lotions, deodorants, cologne, or perfumes.   Put on clean/comfortable clothes.  Brush your teeth.  Ask your nurse before applying any prescription medications to the skin.   CHG Compatible Lotions   Aveeno Moisturizing lotion  Cetaphil Moisturizing Cream  Cetaphil Moisturizing Lotion  Clairol Herbal Essence Moisturizing Lotion, Dry Skin  Clairol Herbal Essence Moisturizing Lotion, Extra Dry Skin  Clairol Herbal Essence Moisturizing Lotion, Normal Skin  Curel Age Defying Therapeutic Moisturizing Lotion with Alpha Hydroxy  Curel Extreme Care Body Lotion  Curel Soothing Hands Moisturizing Hand Lotion  Curel Therapeutic Moisturizing Cream, Fragrance-Free  Curel Therapeutic Moisturizing Lotion, Fragrance-Free  Curel Therapeutic Moisturizing Lotion, Original Formula  Eucerin Daily Replenishing Lotion  Eucerin Dry Skin Therapy Plus Alpha Hydroxy Crme  Eucerin Dry Skin Therapy Plus Alpha Hydroxy Lotion  Eucerin Original Crme  Eucerin Original Lotion  Eucerin Plus Crme Eucerin Plus Lotion  Eucerin TriLipid Replenishing Lotion  Keri Anti-Bacterial Hand Lotion  Keri Deep Conditioning Original Lotion Dry Skin Formula Softly Scented  Keri Deep Conditioning Original Lotion, Fragrance Free Sensitive Skin Formula  Keri Lotion Fast Absorbing Fragrance Free Sensitive Skin Formula  Keri Lotion Fast Absorbing Softly Scented Dry Skin Formula  Keri Original Lotion  Keri Skin Renewal Lotion Keri Silky Smooth Lotion  Keri Silky Smooth Sensitive Skin Lotion  Nivea Body Creamy Conditioning Oil  Nivea Body Extra Enriched Teacher, adult education Moisturizing Lotion Nivea Crme  Nivea Skin Firming Lotion  NutraDerm 30 Skin Lotion  NutraDerm Skin Lotion  NutraDerm Therapeutic Skin Cream  NutraDerm Therapeutic Skin Lotion  ProShield Protective Hand Cream  Provon moisturizing lotion

## 2024-04-29 NOTE — H&P (Signed)
 TOTAL KNEE ADMISSION H&P  Patient is being admitted for left total knee arthroplasty.  Subjective:  Chief Complaint:left knee pain.  HPI: Brandon Clark, 83 y.o. male, has a history of pain and functional disability in the left knee due to arthritis and has failed non-surgical conservative treatments for greater than 12 weeks to includeNSAID's and/or analgesics, corticosteriod injections, viscosupplementation injections, flexibility and strengthening excercises, supervised PT with diminished ADL's post treatment, use of assistive devices, weight reduction as appropriate, and activity modification.  Onset of symptoms was gradual, starting 5 years ago with gradually worsening course since that time. The patient noted no past surgery on the left knee(s).  Patient currently rates pain in the left knee(s) at 10 out of 10 with activity. Patient has night pain, worsening of pain with activity and weight bearing, pain that interferes with activities of daily living, crepitus, and joint swelling.  Patient has evidence of subchondral cysts, subchondral sclerosis, periarticular osteophytes, and joint space narrowing by imaging studies. There is no active infection.  Patient Active Problem List   Diagnosis Date Noted   Laceration of flexor muscle, fascia and tendon of left thumb at forearm level, sequela 03/16/2021   S/P Nissen fundoplication (without gastrostomy tube) procedure 06/02/2017   Dementia (HCC) 05/12/2017   Prediabetes 01/30/2017   Hiatal hernia 01/27/2017   Acid reflux 02/12/2016   Sleep apnea 02/12/2016   Diastasis recti 03/02/2015   Umbilical hernia 09/16/2014   BPV (benign positional vertigo) 07/01/2014   HTN (hypertension) 07/01/2014   Panic attack    Encephalopathy, hypertensive    Ataxia 06/30/2014   Vertigo 06/30/2014   Dyspepsia 04/03/2014   Encounter for colorectal cancer screening 04/03/2014   Bilateral shoulder pain 09/17/2013   Bilateral knee pain 02/05/2013    Past Medical History:  Diagnosis Date   Acid reflux    Arthritis    BPH (benign prostatic hyperplasia)    Dementia (HCC)    Hiatal hernia    Hyperlipidemia    Hypertension    Pre-diabetes    Sleep apnea    sometimes does not use d/t poor fitting of the mask   Ulcer     Past Surgical History:  Procedure Laterality Date   ESOPHAGEAL MANOMETRY N/A 04/27/2016   Procedure: ESOPHAGEAL MANOMETRY (EM) with PH;  Surgeon: Rogelia Copping, MD;  Location: [facility] ENDOSCOPY;  Service: Endoscopy;  Laterality: N/A;   ESOPHAGEAL MANOMETRY N/A 04/19/2017   Procedure: ESOPHAGEAL MANOMETRY (EM);  Surgeon: Dianna Specking, MD;  Location: [facility] ENDOSCOPY;  Service: Endoscopy;  Laterality: N/A;   FACIAL LACERATIONS REPAIR     INSERTION OF MESH N/A 06/02/2017   Procedure: INSERTION OF MESH;  Surgeon: Rubin Calamity, MD;  Location: [facility];  Service: General;  Laterality: N/A;    No current facility-administered medications for this encounter.   Current Outpatient Medications  Medication Sig Dispense Refill Last Dose/Taking   acetaminophen  (TYLENOL ) 500 MG tablet Take 500-1,000 mg by mouth every 6 (six) hours as needed.      albuterol  (VENTOLIN  HFA) 108 (90 Base) MCG/ACT inhaler Inhale 1-2 puffs into the lungs every 4 (four) hours as needed for wheezing or shortness of breath. (Patient not taking: Reported on 04/23/2024) 1 each 0    amitriptyline (ELAVIL) 25 MG tablet Take 25 mg by mouth at bedtime.      aspirin  EC 325 MG tablet Take 1 tablet (325 mg total) by mouth daily. (Patient not taking: Reported on 04/23/2024) 30 tablet 0    azithromycin  (ZITHROMAX ) 250 MG tablet  Take 1 tablet (250 mg total) by mouth daily. Take first 2 tablets together, then 1 every day until finished. (Patient not taking: Reported on 04/23/2024) 6 tablet 0    benzonatate  (TESSALON ) 200 MG capsule Take 1 capsule (200 mg total) by mouth 3 (three) times daily as needed for cough. (Patient not taking: Reported on 04/23/2024) 30 capsule 0     cetirizine  (ZYRTEC ) 5 MG tablet Take 1 tablet (5 mg total) by mouth daily. (Patient not taking: Reported on 04/23/2024) 30 tablet 0    diclofenac  (VOLTAREN ) 75 MG EC tablet Take 75 mg by mouth 2 (two) times daily.      dicyclomine  (BENTYL ) 20 MG tablet Take 1 tablet (20 mg total) by mouth 2 (two) times daily. (Patient not taking: Reported on 04/23/2024) 20 tablet 0    donepezil  (ARICEPT ) 10 MG tablet Take 1 tablet (10 mg total) by mouth at bedtime. 30 tablet 5    finasteride  (PROSCAR ) 5 MG tablet Take 1 tablet (5 mg total) by mouth daily. 30 tablet 3    fluticasone  (FLONASE ) 50 MCG/ACT nasal spray Place 2 sprays into both nostrils daily. (Patient not taking: Reported on 04/23/2024) 16 g 0    hydrochlorothiazide  (HYDRODIURIL ) 12.5 MG tablet Take 1 tablet (12.5 mg total) by mouth daily. 30 tablet 5    hydrOXYzine (ATARAX) 25 MG tablet Take 25 mg by mouth 3 (three) times daily as needed.      lidocaine  (LIDODERM ) 5 % Place 1 patch onto the skin daily. Remove & Discard patch within 12 hours or as directed by MD 30 patch 0    magnesium  oxide (MAG-OX) 400 (240 Mg) MG tablet Take 400 mg by mouth daily.      meclizine  (ANTIVERT ) 12.5 MG tablet Take 1 tablet (12.5 mg total) by mouth 3 (three) times daily as needed for dizziness. 30 tablet 0    memantine (NAMENDA) 10 MG tablet Take 10 mg by mouth 2 (two) times daily.      methocarbamol  (ROBAXIN ) 500 MG tablet Take 500 mg by mouth every 8 (eight) hours as needed for muscle spasms. (Patient not taking: Reported on 04/23/2024)      naproxen  (NAPROSYN ) 500 MG tablet Take 1 tablet (500 mg total) by mouth 2 (two) times daily. (Patient not taking: Reported on 04/23/2024) 30 tablet 0    predniSONE  (DELTASONE ) 10 MG tablet Take 1 tablet (10 mg total) by mouth in the morning, at noon, and at bedtime. (Patient not taking: Reported on 04/23/2024) 15 tablet 0    pyridOXINE (VITAMIN B6) 25 MG tablet Take 25 mg by mouth daily.      simvastatin  (ZOCOR ) 10 MG tablet Take 1  tablet (10 mg total) by mouth at bedtime. 30 tablet 5    Spacer/Aero-Holding Chambers (AEROCHAMBER PLUS) inhaler Use with inhaler 1 each 2    tamsulosin  (FLOMAX ) 0.4 MG CAPS capsule Take 1 capsule (0.4 mg total) by mouth daily. (Patient not taking: Reported on 04/23/2024) 30 capsule 1    traZODone  (DESYREL ) 50 MG tablet Take 1 tablet (50 mg total) by mouth at bedtime as needed for sleep. 30 tablet 3    Allergies  Allergen Reactions   Ondansetron  Itching    Eye swelling    Social History   Tobacco Use   Smoking status: Never   Smokeless tobacco: Never  Substance Use Topics   Alcohol use: No    Alcohol/week: 0.0 standard drinks of alcohol    Family History  Problem Relation Age of Onset  Diabetes Mother    Diabetes Brother    Heart attack Neg Hx    Hyperlipidemia Neg Hx    Hypertension Neg Hx    Sudden death Neg Hx      Review of Systems  Musculoskeletal:  Positive for arthralgias.       Left knee  All other systems reviewed and are negative.   Objective:  Physical Exam Constitutional:      Appearance: Normal appearance.  HENT:     Head: Normocephalic and atraumatic.     Nose: Nose normal.     Mouth/Throat:     Pharynx: Oropharynx is clear.  Eyes:     Extraocular Movements: Extraocular movements intact.  Pulmonary:     Effort: Pulmonary effort is normal.  Abdominal:     Palpations: Abdomen is soft.  Musculoskeletal:     Cervical back: Normal range of motion.     Comments: Both knees move about 0-120.  On the left he has 1+ effusion.  He has medial greater than lateral joint line pain.  Calf is soft and non-tender.  He has good ligamentous integrity.  Hip motion is full.  Skin:    General: Skin is warm and dry.  Neurological:     General: No focal deficit present.     Mental Status: He is alert and oriented to person, place, and time.  Psychiatric:        Mood and Affect: Mood normal.        Behavior: Behavior normal.        Thought Content: Thought  content normal.        Judgment: Judgment normal.     Vital signs in last 24 hours:    Labs:   Estimated body mass index is 36.08 kg/m as calculated from the following:   Height as of 04/23/24: 6' (1.829 m).   Weight as of 04/23/24: 120.7 kg.   Imaging Review Plain radiographs demonstrate severe degenerative joint disease of the left knee(s). The overall alignment isneutral. The bone quality appears to be good for age and reported activity level.      Assessment/Plan:  End stage primary arthritis, left knee   The patient history, physical examination, clinical judgment of the provider and imaging studies are consistent with end stage degenerative joint disease of the left knee(s) and total knee arthroplasty is deemed medically necessary. The treatment options including medical management, injection therapy arthroscopy and arthroplasty were discussed at length. The risks and benefits of total knee arthroplasty were presented and reviewed. The risks due to aseptic loosening, infection, stiffness, patella tracking problems, thromboembolic complications and other imponderables were discussed. The patient acknowledged the explanation, agreed to proceed with the plan and consent was signed. Patient is being admitted for inpatient treatment for surgery, pain control, PT, OT, prophylactic antibiotics, VTE prophylaxis, progressive ambulation and ADL's and discharge planning. The patient is planning to be discharged home with home health services   Patient's anticipated LOS is less than 2 midnights, meeting these requirements: - Younger than 63 - Lives within 1 hour of care - Has a competent adult at home to recover with post-op recover - NO history of  - Chronic pain requiring opiods  - Diabetes  - Coronary Artery Disease  - Heart failure  - Heart attack  - Stroke  - DVT/VTE  - Cardiac arrhythmia  - Respiratory Failure/COPD  - Renal failure  - Anemia  - Advanced Liver  disease

## 2024-04-29 NOTE — Anesthesia Preprocedure Evaluation (Signed)
 Anesthesia Evaluation  Patient identified by MRN, date of birth, ID band Patient awake    Reviewed: Allergy & Precautions, NPO status , Patient's Chart, lab work & pertinent test results  History of Anesthesia Complications Negative for: history of anesthetic complications  Airway Mallampati: III  TM Distance: >3 FB Neck ROM: Full    Dental  (+) Poor Dentition, Dental Advisory Given   Pulmonary sleep apnea    breath sounds clear to auscultation       Cardiovascular hypertension,  Rhythm:Regular Rate:Normal     Neuro/Psych   Anxiety    Dementia    GI/Hepatic hiatal hernia,GERD  ,,  Endo/Other    Renal/GU      Musculoskeletal   Abdominal   Peds  Hematology   Anesthesia Other Findings   Reproductive/Obstetrics                              Anesthesia Physical Anesthesia Plan  ASA: 3  Anesthesia Plan: General   Post-op Pain Management:    Induction: Intravenous  PONV Risk Score and Plan: 1 and Ondansetron , Dexamethasone  and Treatment may vary due to age or medical condition  Airway Management Planned: Oral ETT  Additional Equipment:   Intra-op Plan:   Post-operative Plan: Extubation in OR  Informed Consent:      Dental advisory given and Interpreter used for interview  Plan Discussed with: CRNA and Surgeon  Anesthesia Plan Comments: (See PAT note 04/23/24)         Anesthesia Quick Evaluation

## 2024-04-29 NOTE — Progress Notes (Signed)
 Anesthesia Chart Review   Case: [id number] Date/Time: 04/30/24 1214   Procedure: ARTHROPLASTY, KNEE, TOTAL (Left: Knee)   Anesthesia type: Spinal   Pre-op diagnosis: LEFT KNEE DEGENERATIVE JOINT DISEASE   Location: [facility] ROOM 06 / [facility]   Surgeons: Sheril Coy, MD       DISCUSSION:83 y.o. never smoker with h/o dementia, HTN, sleep apnea, GERD, left knee djd scheduled for above procedure 04/30/2024 with Dr. Coy Sheril.   Pt last seen by cardiology 03/26/24. Per OV note, He does have poor ambulatory status secondary to bilateral osteoarthritis of knees.  EKG at baseline shows nonspecific ST changes in anterior leads.  Risk factor for coronary artery disease include hypertension, hyperlipidemia, prediabetes, obesity with BMI of 39.19.  I will request pharmacological nuclear stress test for further risk stratification.  Echocardiogram result was reviewed with patient.  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  He is interested in repeat imaging and 1 year rather than 6 months.  We discussed about importance of heart rate and blood pressure control for management of dilated ascending aorta.  If the stress test comes as low risk feature, he can undergo planned knee replacement surgery with low risk of adverse cardiovascular outcome perioperatively.  Stress Test 03/27/24 with no ischemia.   VS: BP (!) 120/101   Pulse 100   Temp 36.8 C (Oral)   Resp 20   Ht 6' (1.829 m)   Wt 120.7 kg   SpO2 99%   BMI 36.08 kg/m   PROVIDERS: Gladystine Erminio CROME, MD is PCP   Cardiologist - Marcello Lennox, MD   LABS: Labs reviewed: Acceptable for surgery. (all labs ordered are listed, but only abnormal results are displayed)  Labs Reviewed  BASIC METABOLIC PANEL WITH GFR - Abnormal; Notable for the following components:      Result Value   Glucose, Bld 134 (*)    All other components within normal limits  CBC - Abnormal; Notable for the following components:    RBC 5.86 (*)    All other components within normal limits  SURGICAL PCR SCREEN     IMAGES:   EKG:   CV: Echo 07/01/2014 - Procedure narrative: Transthoracic echocardiography. Image    quality was adequate. The study was technically difficult.  - Left ventricle: Systolic function was normal. The estimated    ejection fraction was in the range of 50% to 55%. Wall motion was    normal; there were no regional wall motion abnormalities. Doppler    parameters are consistent with abnormal left ventricular    relaxation (grade 1 diastolic dysfunction).  - Right atrium: The atrium was mildly dilated.  - Tricuspid valve: There was moderate regurgitation.  Past Medical History:  Diagnosis Date   Acid reflux    Arthritis    BPH (benign prostatic hyperplasia)    Dementia (HCC)    Hiatal hernia    Hyperlipidemia    Hypertension    Pre-diabetes    Sleep apnea    sometimes does not use d/t poor fitting of the mask   Ulcer     Past Surgical History:  Procedure Laterality Date   ESOPHAGEAL MANOMETRY N/A 04/27/2016   Procedure: ESOPHAGEAL MANOMETRY (EM) with PH;  Surgeon: Rogelia Copping, MD;  Location: [facility] ENDOSCOPY;  Service: Endoscopy;  Laterality: N/A;   ESOPHAGEAL MANOMETRY N/A 04/19/2017   Procedure: ESOPHAGEAL MANOMETRY (EM);  Surgeon: Dianna Specking, MD;  Location: [facility];  Service: Endoscopy;  Laterality: N/A;  FACIAL LACERATIONS REPAIR     INSERTION OF MESH N/A 06/02/2017   Procedure: INSERTION OF MESH;  Surgeon: Rubin Calamity, MD;  Location: [facility];  Service: General;  Laterality: N/A;    MEDICATIONS:  amitriptyline (ELAVIL) 25 MG tablet   diclofenac  (VOLTAREN ) 75 MG EC tablet   hydrOXYzine (ATARAX) 25 MG tablet   magnesium  oxide (MAG-OX) 400 (240 Mg) MG tablet   memantine (NAMENDA) 10 MG tablet   pyridOXINE (VITAMIN B6) 25 MG tablet   acetaminophen  (TYLENOL ) 500 MG tablet   albuterol  (VENTOLIN  HFA) 108 (90 Base) MCG/ACT inhaler   aspirin  EC 325 MG tablet    azithromycin  (ZITHROMAX ) 250 MG tablet   benzonatate  (TESSALON ) 200 MG capsule   cetirizine  (ZYRTEC ) 5 MG tablet   dicyclomine  (BENTYL ) 20 MG tablet   donepezil  (ARICEPT ) 10 MG tablet   finasteride  (PROSCAR ) 5 MG tablet   fluticasone  (FLONASE ) 50 MCG/ACT nasal spray   hydrochlorothiazide  (HYDRODIURIL ) 12.5 MG tablet   lidocaine  (LIDODERM ) 5 %   meclizine  (ANTIVERT ) 12.5 MG tablet   methocarbamol  (ROBAXIN ) 500 MG tablet   naproxen  (NAPROSYN ) 500 MG tablet   predniSONE  (DELTASONE ) 10 MG tablet   simvastatin  (ZOCOR ) 10 MG tablet   Spacer/Aero-Holding Chambers (AEROCHAMBER PLUS) inhaler   tamsulosin  (FLOMAX ) 0.4 MG CAPS capsule   traZODone  (DESYREL ) 50 MG tablet   No current facility-administered medications for this encounter.     Harlene Hoots Ward, PA-C [facility] [phone]

## 2024-04-30 ENCOUNTER — Encounter (HOSPITAL_COMMUNITY): Payer: Self-pay | Admitting: Orthopaedic Surgery

## 2024-04-30 ENCOUNTER — Encounter (HOSPITAL_COMMUNITY)
Admission: RE | Disposition: A | Discharge status: Rehabilitation Facility | Payer: Self-pay | Source: Ambulatory Visit | Attending: Orthopaedic Surgery

## 2024-04-30 ENCOUNTER — Ambulatory Visit (HOSPITAL_COMMUNITY): Payer: Worker's Compensation | Admitting: Physician Assistant

## 2024-04-30 ENCOUNTER — Ambulatory Visit (HOSPITAL_COMMUNITY): Payer: Worker's Compensation | Admitting: Anesthesiology

## 2024-04-30 ENCOUNTER — Other Ambulatory Visit: Payer: Self-pay

## 2024-04-30 ENCOUNTER — Inpatient Hospital Stay (HOSPITAL_COMMUNITY)
Admission: RE | Admit: 2024-04-30 | Discharge: 2024-05-13 | DRG: 981 | Disposition: A | Discharge status: Rehabilitation Facility | Payer: Worker's Compensation | Source: Ambulatory Visit | Attending: Student | Admitting: Student

## 2024-04-30 DIAGNOSIS — I10 Essential (primary) hypertension: Secondary | ICD-10-CM

## 2024-04-30 DIAGNOSIS — Z8711 Personal history of peptic ulcer disease: Secondary | ICD-10-CM

## 2024-04-30 DIAGNOSIS — K219 Gastro-esophageal reflux disease without esophagitis: Secondary | ICD-10-CM | POA: Diagnosis present

## 2024-04-30 DIAGNOSIS — F419 Anxiety disorder, unspecified: Secondary | ICD-10-CM

## 2024-04-30 DIAGNOSIS — Z603 Acculturation difficulty: Secondary | ICD-10-CM | POA: Diagnosis present

## 2024-04-30 DIAGNOSIS — J9601 Acute respiratory failure with hypoxia: Secondary | ICD-10-CM | POA: Diagnosis present

## 2024-04-30 DIAGNOSIS — E66812 Obesity, class 2: Secondary | ICD-10-CM | POA: Diagnosis present

## 2024-04-30 DIAGNOSIS — F0394 Unspecified dementia, unspecified severity, with anxiety: Secondary | ICD-10-CM | POA: Diagnosis present

## 2024-04-30 DIAGNOSIS — R4586 Emotional lability: Secondary | ICD-10-CM | POA: Diagnosis not present

## 2024-04-30 DIAGNOSIS — M79662 Pain in left lower leg: Secondary | ICD-10-CM | POA: Diagnosis not present

## 2024-04-30 DIAGNOSIS — F03918 Unspecified dementia, unspecified severity, with other behavioral disturbance: Secondary | ICD-10-CM | POA: Diagnosis present

## 2024-04-30 DIAGNOSIS — Z888 Allergy status to other drugs, medicaments and biological substances status: Secondary | ICD-10-CM

## 2024-04-30 DIAGNOSIS — M1712 Unilateral primary osteoarthritis, left knee: Secondary | ICD-10-CM

## 2024-04-30 DIAGNOSIS — K449 Diaphragmatic hernia without obstruction or gangrene: Secondary | ICD-10-CM | POA: Diagnosis present

## 2024-04-30 DIAGNOSIS — Z7901 Long term (current) use of anticoagulants: Secondary | ICD-10-CM

## 2024-04-30 DIAGNOSIS — Z86718 Personal history of other venous thrombosis and embolism: Secondary | ICD-10-CM

## 2024-04-30 DIAGNOSIS — E785 Hyperlipidemia, unspecified: Secondary | ICD-10-CM | POA: Diagnosis present

## 2024-04-30 DIAGNOSIS — I110 Hypertensive heart disease with heart failure: Secondary | ICD-10-CM | POA: Diagnosis present

## 2024-04-30 DIAGNOSIS — Z96652 Presence of left artificial knee joint: Secondary | ICD-10-CM

## 2024-04-30 DIAGNOSIS — J449 Chronic obstructive pulmonary disease, unspecified: Secondary | ICD-10-CM | POA: Diagnosis present

## 2024-04-30 DIAGNOSIS — G4733 Obstructive sleep apnea (adult) (pediatric): Secondary | ICD-10-CM | POA: Diagnosis present

## 2024-04-30 DIAGNOSIS — K5900 Constipation, unspecified: Secondary | ICD-10-CM | POA: Diagnosis not present

## 2024-04-30 DIAGNOSIS — T81718A Complication of other artery following a procedure, not elsewhere classified, initial encounter: Principal | ICD-10-CM | POA: Diagnosis present

## 2024-04-30 DIAGNOSIS — G8918 Other acute postprocedural pain: Secondary | ICD-10-CM | POA: Diagnosis present

## 2024-04-30 DIAGNOSIS — Z79899 Other long term (current) drug therapy: Secondary | ICD-10-CM

## 2024-04-30 DIAGNOSIS — Z7982 Long term (current) use of aspirin: Secondary | ICD-10-CM

## 2024-04-30 DIAGNOSIS — G4700 Insomnia, unspecified: Secondary | ICD-10-CM | POA: Diagnosis present

## 2024-04-30 DIAGNOSIS — E119 Type 2 diabetes mellitus without complications: Secondary | ICD-10-CM | POA: Diagnosis present

## 2024-04-30 DIAGNOSIS — Z833 Family history of diabetes mellitus: Secondary | ICD-10-CM

## 2024-04-30 DIAGNOSIS — I82452 Acute embolism and thrombosis of left peroneal vein: Secondary | ICD-10-CM | POA: Diagnosis present

## 2024-04-30 DIAGNOSIS — G4730 Sleep apnea, unspecified: Secondary | ICD-10-CM | POA: Diagnosis present

## 2024-04-30 DIAGNOSIS — I82402 Acute embolism and thrombosis of unspecified deep veins of left lower extremity: Secondary | ICD-10-CM | POA: Diagnosis present

## 2024-04-30 DIAGNOSIS — F0390 Unspecified dementia without behavioral disturbance: Secondary | ICD-10-CM | POA: Diagnosis present

## 2024-04-30 DIAGNOSIS — I2699 Other pulmonary embolism without acute cor pulmonale: Secondary | ICD-10-CM | POA: Diagnosis present

## 2024-04-30 DIAGNOSIS — N400 Enlarged prostate without lower urinary tract symptoms: Secondary | ICD-10-CM | POA: Diagnosis present

## 2024-04-30 DIAGNOSIS — Z6836 Body mass index (BMI) 36.0-36.9, adult: Secondary | ICD-10-CM

## 2024-04-30 HISTORY — PX: TOTAL KNEE ARTHROPLASTY: SHX125

## 2024-04-30 HISTORY — DX: Benign paroxysmal vertigo, unspecified ear: H81.10

## 2024-04-30 SURGERY — ARTHROPLASTY, KNEE, TOTAL
Anesthesia: General | Site: Knee | Laterality: Left

## 2024-04-30 MED ORDER — MENTHOL 3 MG MT LOZG: 1.0000 | LOZENGE | OROMUCOSAL | Status: DC | PRN

## 2024-04-30 MED ORDER — SODIUM CHLORIDE 0.9 % IR SOLN
Status: DC | PRN
  Administered 2024-04-30 (×2): 1000 mL

## 2024-04-30 MED ORDER — OXYCODONE HCL 5 MG/5ML PO SOLN: 5.0000 mg | Freq: Once | ORAL | Status: DC | PRN

## 2024-04-30 MED ORDER — POVIDONE-IODINE 10 % EX SWAB
2.0000 | Freq: Once | CUTANEOUS | Status: AC
  Administered 2024-04-30: 2 via TOPICAL

## 2024-04-30 MED ORDER — FENTANYL CITRATE (PF) 50 MCG/ML IJ SOSY
PREFILLED_SYRINGE | INTRAMUSCULAR | Status: AC
  Filled 2024-04-30: qty 1

## 2024-04-30 MED ORDER — OXYCODONE HCL 5 MG PO TABS: 5.0000 mg | ORAL_TABLET | Freq: Once | ORAL | Status: DC | PRN

## 2024-04-30 MED ORDER — FENTANYL CITRATE (PF) 50 MCG/ML IJ SOSY
25.0000 ug | PREFILLED_SYRINGE | INTRAMUSCULAR | Status: DC | PRN
  Administered 2024-04-30 (×3): 50 ug via INTRAVENOUS

## 2024-04-30 MED ORDER — FENTANYL CITRATE (PF) 50 MCG/ML IJ SOSY
50.0000 ug | PREFILLED_SYRINGE | Freq: Once | INTRAMUSCULAR | Status: AC
Start: 2024-04-30 — End: 2024-04-30
  Administered 2024-04-30: 50 ug via INTRAVENOUS
  Filled 2024-04-30: qty 2

## 2024-04-30 MED ORDER — BUPIVACAINE-EPINEPHRINE (PF) 0.5% -1:200000 IJ SOLN
INTRAMUSCULAR | Status: AC
Start: 2024-04-30 — End: 2024-04-30
  Filled 2024-04-30: qty 30

## 2024-04-30 MED ORDER — ACETAMINOPHEN 325 MG PO TABS
325.0000 mg | ORAL_TABLET | Freq: Four times a day (QID) | ORAL | Status: DC | PRN
  Administered 2024-05-13: 650 mg via ORAL
  Filled 2024-04-30: qty 2

## 2024-04-30 MED ORDER — DROPERIDOL 2.5 MG/ML IJ SOLN: 0.6250 mg | Freq: Once | INTRAMUSCULAR | Status: DC | PRN

## 2024-04-30 MED ORDER — 0.9 % SODIUM CHLORIDE (POUR BTL) OPTIME
TOPICAL | Status: DC | PRN
  Administered 2024-04-30: 1000 mL

## 2024-04-30 MED ORDER — SODIUM CHLORIDE (PF) 0.9 % IJ SOLN
INTRAMUSCULAR | Status: DC | PRN
  Administered 2024-04-30: 80 mL

## 2024-04-30 MED ORDER — FENTANYL CITRATE (PF) 100 MCG/2ML IJ SOLN
INTRAMUSCULAR | Status: AC
  Filled 2024-04-30: qty 2

## 2024-04-30 MED ORDER — TRANEXAMIC ACID 1000 MG/10ML IV SOLN
INTRAVENOUS | Status: DC | PRN
  Administered 2024-04-30: 2000 mg via TOPICAL

## 2024-04-30 MED ORDER — TRAZODONE HCL 50 MG PO TABS
50.0000 mg | ORAL_TABLET | Freq: Every evening | ORAL | Status: DC | PRN
  Administered 2024-04-30 – 2024-05-11 (×6): 50 mg via ORAL
  Filled 2024-04-30 (×6): qty 1

## 2024-04-30 MED ORDER — HYDROMORPHONE HCL 1 MG/ML IJ SOLN
0.2500 mg | INTRAMUSCULAR | Status: DC | PRN
  Administered 2024-04-30 (×4): 0.5 mg via INTRAVENOUS

## 2024-04-30 MED ORDER — LACTATED RINGERS IV SOLN: INTRAVENOUS | Status: DC

## 2024-04-30 MED ORDER — ROPIVACAINE HCL 5 MG/ML IJ SOLN
INTRAMUSCULAR | Status: DC | PRN
  Administered 2024-04-30: 20 mL via PERINEURAL

## 2024-04-30 MED ORDER — LACTATED RINGERS IV BOLUS: 500.0000 mL | Freq: Once | INTRAVENOUS | Status: DC

## 2024-04-30 MED ORDER — BUPIVACAINE LIPOSOME 1.3 % IJ SUSP: 20.0000 mL | Freq: Once | INTRAMUSCULAR | Status: DC

## 2024-04-30 MED ORDER — ACETAMINOPHEN 10 MG/ML IV SOLN
1000.0000 mg | Freq: Once | INTRAVENOUS | Status: DC | PRN
  Administered 2024-04-30: 1000 mg via INTRAVENOUS

## 2024-04-30 MED ORDER — SODIUM CHLORIDE (PF) 0.9 % IJ SOLN
INTRAMUSCULAR | Status: AC
Start: 2024-04-30 — End: 2024-04-30
  Filled 2024-04-30: qty 30

## 2024-04-30 MED ORDER — LIDOCAINE HCL (PF) 2 % IJ SOLN
INTRAMUSCULAR | Status: AC
  Filled 2024-04-30: qty 5

## 2024-04-30 MED ORDER — ALUM & MAG HYDROXIDE-SIMETH 200-200-20 MG/5ML PO SUSP
30.0000 mL | ORAL | Status: DC | PRN
  Administered 2024-05-02: 30 mL via ORAL
  Filled 2024-04-30: qty 30

## 2024-04-30 MED ORDER — ONDANSETRON HCL 4 MG/2ML IJ SOLN
INTRAMUSCULAR | Status: AC
  Filled 2024-04-30: qty 2

## 2024-04-30 MED ORDER — HYDROCODONE-ACETAMINOPHEN 5-325 MG PO TABS
1.0000 | ORAL_TABLET | Freq: Four times a day (QID) | ORAL | 0 refills | Status: DC | PRN
Start: 2024-04-30 — End: 2024-05-09

## 2024-04-30 MED ORDER — DIPHENHYDRAMINE HCL 12.5 MG/5ML PO ELIX: 12.5000 mg | ORAL_SOLUTION | ORAL | Status: DC | PRN

## 2024-04-30 MED ORDER — HYDROCHLOROTHIAZIDE 12.5 MG PO TABS
12.5000 mg | ORAL_TABLET | Freq: Every day | ORAL | Status: DC
  Administered 2024-05-01 – 2024-05-13 (×13): 12.5 mg via ORAL
  Filled 2024-04-30 (×13): qty 1

## 2024-04-30 MED ORDER — MEMANTINE HCL 10 MG PO TABS
10.0000 mg | ORAL_TABLET | Freq: Two times a day (BID) | ORAL | Status: DC
  Administered 2024-04-30 – 2024-05-13 (×25): 10 mg via ORAL
  Filled 2024-04-30: qty 2
  Filled 2024-04-30 (×3): qty 1
  Filled 2024-04-30: qty 2
  Filled 2024-04-30 (×9): qty 1
  Filled 2024-04-30 (×2): qty 2
  Filled 2024-04-30: qty 1
  Filled 2024-04-30: qty 2
  Filled 2024-04-30 (×5): qty 1
  Filled 2024-04-30 (×2): qty 2

## 2024-04-30 MED ORDER — CHLORHEXIDINE GLUCONATE 0.12 % MT SOLN
15.0000 mL | Freq: Once | OROMUCOSAL | Status: AC
  Administered 2024-04-30: 15 mL via OROMUCOSAL

## 2024-04-30 MED ORDER — SUGAMMADEX SODIUM 200 MG/2ML IV SOLN
INTRAVENOUS | Status: AC
  Filled 2024-04-30: qty 2

## 2024-04-30 MED ORDER — DONEPEZIL HCL 10 MG PO TABS
10.0000 mg | ORAL_TABLET | Freq: Every day | ORAL | Status: DC
  Administered 2024-04-30 – 2024-05-12 (×13): 10 mg via ORAL
  Filled 2024-04-30 (×13): qty 1

## 2024-04-30 MED ORDER — TRANEXAMIC ACID-NACL 1000-0.7 MG/100ML-% IV SOLN
1000.0000 mg | INTRAVENOUS | Status: AC
  Administered 2024-04-30: 1000 mg via INTRAVENOUS
  Filled 2024-04-30: qty 100

## 2024-04-30 MED ORDER — METOCLOPRAMIDE HCL 5 MG PO TABS: 5.0000 mg | ORAL_TABLET | Freq: Three times a day (TID) | ORAL | Status: DC | PRN

## 2024-04-30 MED ORDER — HYDROMORPHONE HCL 1 MG/ML IJ SOLN
INTRAMUSCULAR | Status: AC
  Filled 2024-04-30: qty 2

## 2024-04-30 MED ORDER — LIDOCAINE HCL (CARDIAC) PF 100 MG/5ML IV SOSY
PREFILLED_SYRINGE | INTRAVENOUS | Status: DC | PRN
  Administered 2024-04-30: 100 mg via INTRAVENOUS

## 2024-04-30 MED ORDER — LACTATED RINGERS IV BOLUS: 250.0000 mL | Freq: Once | INTRAVENOUS | Status: DC

## 2024-04-30 MED ORDER — BUPIVACAINE LIPOSOME 1.3 % IJ SUSP
INTRAMUSCULAR | Status: AC
  Filled 2024-04-30: qty 20

## 2024-04-30 MED ORDER — ACETAMINOPHEN 500 MG PO TABS
500.0000 mg | ORAL_TABLET | Freq: Four times a day (QID) | ORAL | Status: AC
  Administered 2024-05-01 (×2): 500 mg via ORAL
  Filled 2024-04-30 (×4): qty 1

## 2024-04-30 MED ORDER — METHOCARBAMOL 1000 MG/10ML IJ SOLN
INTRAMUSCULAR | Status: AC
  Filled 2024-04-30: qty 10

## 2024-04-30 MED ORDER — TRANEXAMIC ACID 1000 MG/10ML IV SOLN
2000.0000 mg | INTRAVENOUS | Status: DC
  Filled 2024-04-30: qty 20

## 2024-04-30 MED ORDER — TRANEXAMIC ACID-NACL 1000-0.7 MG/100ML-% IV SOLN
1000.0000 mg | Freq: Once | INTRAVENOUS | Status: AC
  Administered 2024-04-30: 1000 mg via INTRAVENOUS
  Filled 2024-04-30: qty 100

## 2024-04-30 MED ORDER — AMITRIPTYLINE HCL 25 MG PO TABS
25.0000 mg | ORAL_TABLET | Freq: Every day | ORAL | Status: DC
  Administered 2024-04-30 – 2024-05-12 (×13): 25 mg via ORAL
  Filled 2024-04-30 (×13): qty 1

## 2024-04-30 MED ORDER — METHOCARBAMOL 500 MG PO TABS
500.0000 mg | ORAL_TABLET | Freq: Four times a day (QID) | ORAL | Status: DC | PRN
  Administered 2024-05-01 – 2024-05-02 (×4): 500 mg via ORAL
  Filled 2024-04-30 (×4): qty 1

## 2024-04-30 MED ORDER — ROCURONIUM BROMIDE 100 MG/10ML IV SOLN
INTRAVENOUS | Status: DC | PRN
  Administered 2024-04-30: 100 mg via INTRAVENOUS

## 2024-04-30 MED ORDER — SIMVASTATIN 20 MG PO TABS
10.0000 mg | ORAL_TABLET | Freq: Every day | ORAL | Status: DC
  Administered 2024-04-30 – 2024-05-12 (×13): 10 mg via ORAL
  Filled 2024-04-30 (×14): qty 1

## 2024-04-30 MED ORDER — ORAL CARE MOUTH RINSE: 15.0000 mL | Freq: Once | OROMUCOSAL | Status: AC

## 2024-04-30 MED ORDER — KETOROLAC TROMETHAMINE 15 MG/ML IJ SOLN
7.5000 mg | Freq: Four times a day (QID) | INTRAMUSCULAR | Status: AC
  Administered 2024-04-30 – 2024-05-01 (×4): 7.5 mg via INTRAVENOUS
  Filled 2024-04-30 (×4): qty 1

## 2024-04-30 MED ORDER — PROPOFOL 1000 MG/100ML IV EMUL
INTRAVENOUS | Status: AC
  Filled 2024-04-30: qty 100

## 2024-04-30 MED ORDER — SODIUM CHLORIDE (PF) 0.9 % IJ SOLN
INTRAMUSCULAR | Status: AC
Start: 2024-04-30 — End: 2024-04-30
  Filled 2024-04-30: qty 50

## 2024-04-30 MED ORDER — ACETAMINOPHEN 10 MG/ML IV SOLN
INTRAVENOUS | Status: AC
  Filled 2024-04-30: qty 100

## 2024-04-30 MED ORDER — ASPIRIN 81 MG PO CHEW
81.0000 mg | CHEWABLE_TABLET | Freq: Two times a day (BID) | ORAL | Status: DC
  Administered 2024-05-01 – 2024-05-02 (×3): 81 mg via ORAL
  Filled 2024-04-30 (×3): qty 1

## 2024-04-30 MED ORDER — LACTATED RINGERS IV SOLN
INTRAVENOUS | Status: DC | PRN
Start: 2024-04-30 — End: 2024-04-30

## 2024-04-30 MED ORDER — PROPOFOL 10 MG/ML IV BOLUS
INTRAVENOUS | Status: DC | PRN
Start: 2024-04-30 — End: 2024-04-30
  Administered 2024-04-30: 150 mg via INTRAVENOUS

## 2024-04-30 MED ORDER — MORPHINE SULFATE (PF) 2 MG/ML IV SOLN
0.5000 mg | INTRAVENOUS | Status: DC | PRN
  Administered 2024-04-30: 0.5 mg via INTRAVENOUS
  Administered 2024-05-01 – 2024-05-13 (×21): 1 mg via INTRAVENOUS
  Filled 2024-04-30 (×23): qty 1

## 2024-04-30 MED ORDER — CEFAZOLIN SODIUM-DEXTROSE 2-4 GM/100ML-% IV SOLN
2.0000 g | Freq: Four times a day (QID) | INTRAVENOUS | Status: AC
  Administered 2024-04-30 – 2024-05-01 (×2): 2 g via INTRAVENOUS
  Filled 2024-04-30 (×2): qty 100

## 2024-04-30 MED ORDER — FENTANYL CITRATE (PF) 50 MCG/ML IJ SOSY
PREFILLED_SYRINGE | INTRAMUSCULAR | Status: AC
  Filled 2024-04-30: qty 2

## 2024-04-30 MED ORDER — BISACODYL 5 MG PO TBEC
5.0000 mg | DELAYED_RELEASE_TABLET | Freq: Every day | ORAL | Status: DC | PRN
  Administered 2024-05-05 – 2024-05-07 (×2): 5 mg via ORAL
  Filled 2024-04-30 (×2): qty 1

## 2024-04-30 MED ORDER — DEXAMETHASONE SOD PHOSPHATE PF 10 MG/ML IJ SOLN
INTRAMUSCULAR | Status: DC | PRN
  Administered 2024-04-30: 10 mg via INTRAVENOUS

## 2024-04-30 MED ORDER — METOCLOPRAMIDE HCL 5 MG/ML IJ SOLN
5.0000 mg | Freq: Three times a day (TID) | INTRAMUSCULAR | Status: DC | PRN
  Administered 2024-05-02 – 2024-05-06 (×2): 10 mg via INTRAVENOUS
  Filled 2024-04-30 (×2): qty 2

## 2024-04-30 MED ORDER — HYDROCODONE-ACETAMINOPHEN 5-325 MG PO TABS
1.0000 | ORAL_TABLET | ORAL | Status: DC | PRN
  Administered 2024-04-30: 1 via ORAL
  Administered 2024-05-01 – 2024-05-04 (×6): 2 via ORAL
  Administered 2024-05-05: 1 via ORAL
  Administered 2024-05-07 – 2024-05-08 (×3): 2 via ORAL
  Administered 2024-05-08: 1 via ORAL
  Administered 2024-05-08: 2 via ORAL
  Administered 2024-05-08: 1 via ORAL
  Administered 2024-05-09: 2 via ORAL
  Filled 2024-04-30 (×7): qty 2
  Filled 2024-04-30: qty 1
  Filled 2024-04-30 (×4): qty 2
  Filled 2024-04-30: qty 1
  Filled 2024-04-30: qty 2
  Filled 2024-04-30: qty 1

## 2024-04-30 MED ORDER — STERILE WATER FOR IRRIGATION IR SOLN
Status: DC | PRN
  Administered 2024-04-30: 2000 mL

## 2024-04-30 MED ORDER — METHOCARBAMOL 1000 MG/10ML IJ SOLN
500.0000 mg | Freq: Four times a day (QID) | INTRAMUSCULAR | Status: DC | PRN
  Administered 2024-04-30: 500 mg via INTRAVENOUS

## 2024-04-30 MED ORDER — CEFAZOLIN SODIUM-DEXTROSE 3-4 GM/150ML-% IV SOLN
3.0000 g | INTRAVENOUS | Status: AC
  Administered 2024-04-30: 3 g via INTRAVENOUS
  Filled 2024-04-30: qty 150

## 2024-04-30 MED ORDER — FENTANYL CITRATE (PF) 100 MCG/2ML IJ SOLN
INTRAMUSCULAR | Status: DC | PRN
  Administered 2024-04-30 (×4): 50 ug via INTRAVENOUS

## 2024-04-30 MED ORDER — HYDROCODONE-ACETAMINOPHEN 7.5-325 MG PO TABS
1.0000 | ORAL_TABLET | ORAL | Status: DC | PRN
  Administered 2024-05-01 – 2024-05-05 (×9): 2 via ORAL
  Administered 2024-05-05: 1 via ORAL
  Administered 2024-05-05 – 2024-05-09 (×6): 2 via ORAL
  Filled 2024-04-30 (×13): qty 2
  Filled 2024-04-30: qty 1
  Filled 2024-04-30 (×3): qty 2

## 2024-04-30 MED ORDER — PHENOL 1.4 % MT LIQD: 1.0000 | OROMUCOSAL | Status: DC | PRN

## 2024-04-30 MED ORDER — SUGAMMADEX SODIUM 200 MG/2ML IV SOLN
INTRAVENOUS | Status: DC | PRN
  Administered 2024-04-30: 200 mg via INTRAVENOUS

## 2024-04-30 MED ORDER — METHOCARBAMOL 500 MG PO TABS: 500.0000 mg | ORAL_TABLET | Freq: Four times a day (QID) | ORAL | 0 refills | Status: DC | PRN

## 2024-04-30 MED ORDER — FINASTERIDE 5 MG PO TABS
5.0000 mg | ORAL_TABLET | Freq: Every day | ORAL | Status: DC
  Administered 2024-04-30 – 2024-05-13 (×14): 5 mg via ORAL
  Filled 2024-04-30 (×14): qty 1

## 2024-04-30 MED ORDER — DOCUSATE SODIUM 100 MG PO CAPS
100.0000 mg | ORAL_CAPSULE | Freq: Two times a day (BID) | ORAL | Status: DC
  Administered 2024-04-30 – 2024-05-13 (×26): 100 mg via ORAL
  Filled 2024-04-30 (×26): qty 1

## 2024-04-30 SURGICAL SUPPLY — 51 items
ATTUNE MED DOME PAT 41 KNEE (Knees) IMPLANT
ATTUNE PS FEM LT SZ 8 CEM KNEE (Femur) IMPLANT
ATTUNE PSRP INSR SZ8 6 KNEE (Insert) IMPLANT
BAG COUNTER SPONGE SURGICOUNT (BAG) ×1 IMPLANT
BAG DECANTER FOR FLEXI CONT (MISCELLANEOUS) ×1 IMPLANT
BAG ZIPLOCK 12X15 (MISCELLANEOUS) ×1 IMPLANT
BASE TIBIAL ROT PLAT SZ 10 KNE (Miscellaneous) IMPLANT
BLADE SAGITTAL 25.0X1.19X90 (BLADE) ×1 IMPLANT
BLADE SAW SGTL 11.0X1.19X90.0M (BLADE) ×1 IMPLANT
BLADE SURG SZ10 CARB STEEL (BLADE) ×1 IMPLANT
BNDG ELASTIC 6INX 5YD STR LF (GAUZE/BANDAGES/DRESSINGS) ×1 IMPLANT
BNDG ELASTIC 6X10 VLCR STRL LF (GAUZE/BANDAGES/DRESSINGS) IMPLANT
BOOTIES KNEE HIGH SLOAN (MISCELLANEOUS) ×1 IMPLANT
BOWL SMART MIX CTS (DISPOSABLE) ×1 IMPLANT
CEMENT HV SMART SET (Cement) ×2 IMPLANT
COOLER ICEMAN CLASSIC (MISCELLANEOUS) IMPLANT
COVER SURGICAL LIGHT HANDLE (MISCELLANEOUS) ×1 IMPLANT
CUFF TRNQT CYL 34X4.125X (TOURNIQUET CUFF) ×1 IMPLANT
DRAPE TOP 10253 STERILE (DRAPES) ×1 IMPLANT
DRAPE U-SHAPE 47X51 STRL (DRAPES) ×1 IMPLANT
DRSG AQUACEL AG ADV 3.5X10 (GAUZE/BANDAGES/DRESSINGS) ×1 IMPLANT
DURAPREP 26ML APPLICATOR (WOUND CARE) ×2 IMPLANT
ELECT PENCIL ROCKER SW 15FT (MISCELLANEOUS) ×1 IMPLANT
ELECT REM PT RETURN 15FT ADLT (MISCELLANEOUS) ×1 IMPLANT
GLOVE BIO SURGEON STRL SZ8 (GLOVE) ×2 IMPLANT
GLOVE BIOGEL PI IND STRL 7.0 (GLOVE) ×1 IMPLANT
GLOVE BIOGEL PI IND STRL 8 (GLOVE) ×2 IMPLANT
GLOVE SURG SYN 7.0 PF PI (GLOVE) ×1 IMPLANT
GOWN SRG XL LVL 4 BRTHBL STRL (GOWNS) ×1 IMPLANT
GOWN STRL REUS W/ TWL XL LVL3 (GOWN DISPOSABLE) ×2 IMPLANT
HOLDER FOLEY CATH W/STRAP (MISCELLANEOUS) IMPLANT
HOOD PEEL AWAY T7 (MISCELLANEOUS) ×3 IMPLANT
KIT TURNOVER KIT A (KITS) ×1 IMPLANT
MANIFOLD NEPTUNE II (INSTRUMENTS) ×1 IMPLANT
NS IRRIG 1000ML POUR BTL (IV SOLUTION) ×1 IMPLANT
PACK TOTAL KNEE CUSTOM (KITS) ×1 IMPLANT
PAD ARMBOARD POSITIONER FOAM (MISCELLANEOUS) ×1 IMPLANT
PAD COLD SHLDR WRAP-ON (PAD) IMPLANT
PIN STEINMAN FIXATION KNEE (PIN) IMPLANT
PROTECTOR NERVE ULNAR (MISCELLANEOUS) ×1 IMPLANT
SET HNDPC FAN SPRY TIP SCT (DISPOSABLE) ×1 IMPLANT
SPIKE FLUID TRANSFER (MISCELLANEOUS) ×2 IMPLANT
SUT ETHIBOND NAB CT1 #1 30IN (SUTURE) ×1 IMPLANT
SUT VIC AB 0 CT1 36 (SUTURE) ×2 IMPLANT
SUT VIC AB 2-0 CT1 TAPERPNT 27 (SUTURE) ×1 IMPLANT
SUT VICRYL AB 3-0 FS1 BRD 27IN (SUTURE) ×1 IMPLANT
SUTURE STRATFX 0 PDS 27 VIOLET (SUTURE) ×1 IMPLANT
TRAY FOLEY MTR SLVR 16FR STAT (SET/KITS/TRAYS/PACK) IMPLANT
WATER STERILE IRR 1000ML POUR (IV SOLUTION) ×2 IMPLANT
WRAP KNEE MAXI GEL POST OP (GAUZE/BANDAGES/DRESSINGS) ×1 IMPLANT
YANKAUER SUCT BULB TIP NO VENT (SUCTIONS) ×1 IMPLANT

## 2024-04-30 NOTE — Interval H&P Note (Signed)
 History and Physical Interval Note:  04/30/2024 11:25 AM  Maico [name] Kauth  has presented today for surgery, with the diagnosis of LEFT KNEE DEGENERATIVE JOINT DISEASE.  The various methods of treatment have been discussed with the patient and family. After consideration of risks, benefits and other options for treatment, the patient has consented to  Procedure(s): ARTHROPLASTY, KNEE, TOTAL (Left) as a surgical intervention.  The patient's history has been reviewed, patient examined, no change in status, stable for surgery.  I have reviewed the patient's chart and labs.  Questions were answered to the patient's satisfaction.     Jamael Hoffmann G Alayiah Fontes

## 2024-04-30 NOTE — Transfer of Care (Signed)
 Immediate Anesthesia Transfer of Care Note  Patient: Brandon Clark  Procedure(s) Performed: ARTHROPLASTY, KNEE, TOTAL (Left: Knee)  Patient Location: PACU  Anesthesia Type:General and Regional  Level of Consciousness: awake and pateint uncooperative  Airway & Oxygen Therapy: Patient Spontanous Breathing and Patient connected to face mask oxygen  Post-op Assessment: Report given to RN and Post -op Vital signs reviewed and stable  Post vital signs: Reviewed and stable  Last Vitals:  Vitals Value Taken Time  BP 161/99 04/30/24 14:42  Temp    Pulse 99 04/30/24 14:47  Resp 24 04/30/24 14:47  SpO2 95 % 04/30/24 14:47  Vitals shown include unfiled device data.  Last Pain:  Vitals:   04/30/24 1056  TempSrc:   PainSc: 5          Complications: No notable events documented.

## 2024-04-30 NOTE — Anesthesia Procedure Notes (Signed)
 Procedure Name: Intubation Date/Time: 04/30/2024 12:37 PM  Performed by: Deeann Eva BROCKS, CRNAPre-anesthesia Checklist: Patient identified, Emergency Drugs available, Suction available and Patient being monitored Patient Re-evaluated:Patient Re-evaluated prior to induction Oxygen Delivery Method: Circle System Utilized Preoxygenation: Pre-oxygenation with 100% oxygen Induction Type: IV induction Ventilation: Mask ventilation without difficulty Laryngoscope Size: Mac and 3 Grade View: Grade II Tube type: Oral Tube size: 8.0 mm Number of attempts: 1 Airway Equipment and Method: Stylet Placement Confirmation: ETT inserted through vocal cords under direct vision, positive ETCO2 and breath sounds checked- equal and bilateral Secured at: 23 cm Tube secured with: Tape Dental Injury: Teeth and Oropharynx as per pre-operative assessment

## 2024-04-30 NOTE — Progress Notes (Signed)
 PT Cancellation Note  Patient Details Name: Brandon Clark MRN: [id number] DOB: [birth year]   Cancelled Treatment:    Reason Eval/Treat Not Completed: Pain limiting ability to participate. [id number] communication with nursing staff whom reports pt writhing, crying and screaming in pain. Pt is not appropriate for PT eval at this time. PT to continue to follow acutely.   [place], PT Acute Rehab   [place] VEAR Drone 04/30/2024, 6:37 PM

## 2024-04-30 NOTE — Op Note (Signed)
 PREOP DIAGNOSIS: DJD LEFT KNEE POSTOP DIAGNOSIS:  same PROCEDURE: LEFT TKR ANESTHESIA: General ATTENDING SURGEON: Maude KANDICE Herald ASSISTANT: Prentice Earl PA  INDICATIONS FOR PROCEDURE: Brandon Clark is a 83 y.o. male who has struggled for a long time with pain due to degenerative arthritis of the left knee.  The patient has failed many conservative non-operative measures and at this point has pain which limits the ability to sleep and walk.  The patient is offered total knee replacement.  Informed operative consent was obtained after discussion of possible risks of anesthesia, infection, neurovascular injury, DVT, and death.  The importance of the post-operative rehabilitation protocol to optimize result was stressed extensively with the patient.  SUMMARY OF FINDINGS AND PROCEDURE:  Brandon Clark was taken to the operative suite where under the above anesthesia a left knee replacement was performed.  There were advanced degenerative changes and the bone quality was excellent.  We used the DePuyAttune system and placed size 8 femur, 10 tibia, 41 mm all polyethylene patella, and a size 6 mm spacer.  Prentice Earl PA-C assisted throughout and was invaluable to the completion of the case in that he helped retract and maintain exposure while I placed the components.  He also helped close thereby minimizing OR time.  The patient was admitted for appropriate post-op care to include perioperative antibiotics and mechanical and pharmacologic measures for DVT prophylaxis.  DESCRIPTION OF PROCEDURE:  Brandon Clark was taken to the operative suite where the above anesthesia was applied.  The patient was positioned supine and prepped and draped in normal sterile fashion.  An appropriate time out was performed.  After the administration of kefzol  pre-op antibiotic the leg was elevated and exsanguinated and a tourniquet inflated.  A standard longitudinal incision was made on the  anterior knee.  Dissection was carried down to the extensor mechanism.  All appropriate anti-infective measures were used including the pre-operative antibiotic, betadine impregnated drape, and closed hooded exhaust systems for each member of the surgical team.  A medial parapatellar incision was made in the extensor mechanism and the knee cap flipped and the knee flexed.  Some residual meniscal tissues were removed along with any remaining ACL/PCL tissue.  A guide was placed on the tibia and a flat cut was made on it's superior surface.  An intramedullary guide was placed in the femur and was utilized to make anterior and posterior cuts creating an appropriate flexion gap.  A second intramedullary guide was placed in the femur to make a distal cut properly balancing the knee with an extension gap equal to the flexion gap.  The three bones sized to the above mentioned sizes and the appropriate guides were placed and utilized.  A trial reduction was done and the knee easily came to full extension and the patella tracked well on flexion.  The trial components were removed and all bones were cleaned with pulsatile lavage and then dried thoroughly.  Cement was mixed and was pressurized onto the bones followed by placement of the aforementioned components.  Excess cement was trimmed and pressure was held on the components until the cement had hardened.  The tourniquet was deflated and a small amount of bleeding was controlled with cautery and pressure.  The knee was irrigated thoroughly.  The extensor mechanism was re-approximated with #1 ethibond in interrupted fashion.  The knee was flexed and the repair was solid.  The subcutaneous tissues were re-approximated with #0 and #2-0 vicryl and the skin closed with  a subcuticular stitch and steristrips.  A sterile dressing was applied.  Intraoperative fluids, EBL, and tourniquet time can be obtained from anesthesia records.  DISPOSITION:  The patient was taken to recovery  room in stable condition and scheduled to potentially go home same day depending on ability to walk and tolerate liquids.  Maude MATSU Carnel Stegman 04/30/2024, 2:10 PM

## 2024-04-30 NOTE — Anesthesia Procedure Notes (Addendum)
 Anesthesia Regional Block: Adductor canal block   Pre-Anesthetic Checklist: , timeout performed,  Correct Patient, Correct Site, Correct Laterality,  Correct Procedure, Correct Position, site marked,  Risks and benefits discussed,  Surgical consent,  Pre-op evaluation,  At surgeon's request and post-op pain management  Laterality: Lower and Left  Prep: chloraprep       Needles:  Injection technique: Single-shot  Needle Type: Echogenic Stimulator Needle     Needle Length: 10cm  Needle Gauge: 20     Additional Needles:   Procedures:,,,, ultrasound used (permanent image in chart),, #20gu IV placed    Narrative:  Start time: 04/30/2024 11:39 AM End time: 04/30/2024 11:39 AM Injection made incrementally with aspirations every 5 mL.  Performed by: Personally  Anesthesiologist: Waddell Lauraine NOVAK, MD  Additional Notes: Timeout performed with bedside RN - Name, DOB, allergies and laterality confirmed by the patient and RN. Surgical marking performed/confirmed. Anticoagulation status and most recent platelet count reviewed. Patient placed in a frog-leg position and sedation (as documented by RN) given via PIV. Peripheral nerve block performed as documented above. VSS throughout (see Flowchart).

## 2024-04-30 NOTE — Anesthesia Postprocedure Evaluation (Signed)
 Anesthesia Post Note  Patient: Brandon Clark  Procedure(s) Performed: ARTHROPLASTY, KNEE, TOTAL (Left: Knee)     Patient location during evaluation: PACU Anesthesia Type: General Level of consciousness: awake Pain management: pain level controlled Vital Signs Assessment: post-procedure vital signs reviewed and stable Respiratory status: spontaneous breathing Cardiovascular status: blood pressure returned to baseline Postop Assessment: no apparent nausea or vomiting Anesthetic complications: no   No notable events documented.                Lauraine KATHEE [place]

## 2024-05-01 ENCOUNTER — Encounter (HOSPITAL_COMMUNITY): Payer: Self-pay | Admitting: Orthopaedic Surgery

## 2024-05-01 LAB — COMPREHENSIVE METABOLIC PANEL WITH GFR
ALT: 23 U/L (ref 0–44)
AST: 38 U/L (ref 15–41)
Albumin: 3.8 g/dL (ref 3.5–5.0)
Alkaline Phosphatase: 61 U/L (ref 38–126)
Anion gap: 9 (ref 5–15)
BUN: 21 mg/dL (ref 8–23)
CO2: 28 mmol/L (ref 22–32)
Calcium: 8.8 mg/dL — ABNORMAL LOW (ref 8.9–10.3)
Chloride: 101 mmol/L (ref 98–111)
Creatinine, Ser: 1.03 mg/dL (ref 0.61–1.24)
GFR, Estimated: >60 mL/min (ref >60)
Glucose, Bld: 129 mg/dL — ABNORMAL HIGH (ref 70–99)
Potassium: 4.2 mmol/L (ref 3.5–5.1)
Sodium: 138 mmol/L (ref 135–145)
Total Bilirubin: 0.7 mg/dL (ref 0.0–1.2)
Total Protein: 6.1 g/dL — ABNORMAL LOW (ref 6.5–8.1)

## 2024-05-01 LAB — CBC
HCT: 46.2 % (ref 39.0–52.0)
Hemoglobin: 14.5 g/dL (ref 13.0–17.0)
MCH: 28.3 pg (ref 26.0–34.0)
MCHC: 31.4 g/dL (ref 30.0–36.0)
MCV: 90.2 fL (ref 80.0–100.0)
Platelets: 174 K/uL (ref 150–400)
RBC: 5.12 MIL/uL (ref 4.22–5.81)
RDW: 13.4 % (ref 11.5–15.5)
WBC: 10 K/uL (ref 4.0–10.5)
nRBC: 0 % (ref 0.0–0.2)

## 2024-05-01 MED ORDER — ASPIRIN 81 MG PO TBEC: 81.0000 mg | DELAYED_RELEASE_TABLET | Freq: Two times a day (BID) | ORAL | 0 refills | Status: DC

## 2024-05-01 MED ORDER — SODIUM CHLORIDE 0.9 % IV BOLUS
500.0000 mL | Freq: Once | INTRAVENOUS | Status: AC
  Administered 2024-05-01: 500 mL via INTRAVENOUS

## 2024-05-01 NOTE — Evaluation (Signed)
 Physical Therapy Evaluation Patient Details Name: Brandon Clark MRN: [id number] DOB: February 04, 1941 Today's Date: 05/01/2024  History of Present Illness  Pt is 83 yo male presents to therapy s/p L TKA on 04/30/2024 due to failure of conservative measures. Pt PMH includes but is not limited to: dementia, s/p Nissen fundoplication procedure, hernia, OSA, BPPV, HTN, ataxia, DOE, arthritis, and B UE and LE pain.  Clinical Impression  Pt is s/p TKA resulting in the deficits listed below (see PT Problem List). At baseline, pt resides with son and granddaughter.  He is typically independent and prefers ambulation without AD.  Today, evaluation limited due to severe calf pain, dizziness, and shortness of breath - see comments below. Pt did sit EOB and took some side steps for repositioning with mod A.  Pt may need increased length of hospital stay to progress mobility to home level.  Pt will benefit from acute skilled PT to increase their independence and safety with mobility to allow discharge.      General Comments: In supine pt's major complaint was calf pain and cramping on his post op L LE.  With testing pt does have edema -as expected post op, but his calf pain is severe.  With light touch pt screaming and withdrawing in pain. Additionally, screaming and grimacing with ankle DF and PF.  Pt additionally with shortness of breath that he reports is not baseline, possibly due to pain but sats are 90% on RA.  Pt was on 2 L at arrival and left on 2 L with sats 93%.  No c/o chest pain.  With PT only sat EOB and moved toward [facility] due to c/o severe calf pain and SOB - wanted to notify nursing and medical team of concerns to see if concerned about DVT or PE; prior to progressing   Additionally, pt with significant dizziness (spinning) with transfers, standing, and laying flat.  Pt denied dizziness prior to sx but later did note hx of vertigo, taking meclizine , and of BPPV.  Did not note any nystagmus during  session -but did not formally assess vestibular system at this time (limited by pain and other symptoms).  Did monitor for orthostatic hypotension -which was negative with BP's consistently 120's-130's/90's-100      Notified RN of severe calf pain, SOB, dizziness, all above comments and she notified PA.      If plan is discharge home, recommend the following: A lot of help with walking and/or transfers;A lot of help with bathing/dressing/bathroom;Assistance with cooking/housework;Help with stairs or ramp for entrance   Can travel by private vehicle        Equipment Recommendations None recommended by PT  Recommendations for Other Services       Functional Status Assessment Patient has had a recent decline in their functional status and demonstrates the ability to make significant improvements in function in a reasonable and predictable amount of time.     Precautions / Restrictions Precautions Precautions: Fall;Knee Restrictions Weight Bearing Restrictions Per Provider Order: Yes LLE Weight Bearing Per Provider Order: Weight bearing as tolerated      Mobility  Bed Mobility Overal bed mobility: Needs Assistance Bed Mobility: Supine to Sit, Sit to Supine     Supine to sit: Mod assist Sit to supine: Mod assist   General bed mobility comments: assist for legs and trunk    Transfers Overall transfer level: Needs assistance Equipment used: Rolling walker (2 wheels) Transfers: Sit to/from Stand Sit to Stand: Mod assist  General transfer comment: Initially attempting lateral scoot to [facility] but pt unable and preferred to stand and take a couple steps; heavy use of UE and R LE to stand    Ambulation/Gait Ambulation/Gait assistance: Min assist, +2 safety/equipment Gait Distance (Feet): 2 Feet Assistive device: Rolling walker (2 wheels) Gait Pattern/deviations: Step-to pattern, Decreased stride length, Decreased weight shift to left Gait velocity: decreased      General Gait Details: very little weight on L; only taking side steps to move toward [facility]  Stairs            Wheelchair Mobility     Tilt Bed    Modified Rankin (Stroke Patients Only)       Balance Overall balance assessment: Needs assistance Sitting-balance support: Bilateral upper extremity supported Sitting balance-Leahy Scale: Fair Sitting balance - Comments: Needing UE support and min A at times when dizzy   Standing balance support: Bilateral upper extremity supported, Reliant on assistive device for balance Standing balance-Leahy Scale: Poor                               Pertinent Vitals/Pain Pain Assessment Pain Assessment: 0-10 Pain Score: 9  Pain Location: L calf , L knee Pain Descriptors / Indicators: Discomfort, Throbbing, Sharp, Grimacing, Moaning, Guarding Pain Intervention(s): Limited activity within patient's tolerance, Monitored during session, Premedicated before session, Repositioned, Relaxation, Ice applied    Home Living Family/patient expects to be discharged to:: Private residence Living Arrangements: Children Available Help at Discharge: Family;Available 24 hours/day Type of Home: House Home Access: Stairs to enter   [employer] of Steps: 1 threshold Alternate Level Stairs-Number of Steps: Pt typically stays upstairs but has full bath downstairs and can convert office to bedroom if needed.  Full flight of steps Home Layout: Multi-level Home Equipment: Rolling Walker (2 wheels);Cane - single point;Shower seat      Prior Function Prior Level of Function : Independent/Modified Independent             Mobility Comments: Prefers to not use cane but benefits from it per family; could ambulate in community (if holding grocery cart) ADLs Comments: independent with adls and light iadls     Extremity/Trunk Assessment   Upper Extremity Assessment Upper Extremity Assessment: Overall WFL for tasks assessed    Lower  Extremity Assessment Lower Extremity Assessment: LLE deficits/detail;RLE deficits/detail RLE Deficits / Details: ROM WFL; MMT 5/5 LLE Deficits / Details: Post op leg.  Significantly limited by pain.  Pt with more c/o severe pain/cramping in L calf.  He is moaning and guarding.  Pt yelling and withdrawing with even light touch to calf and with ankle DF/PF.  He does have pain in knee with attempts to weight bear.  ROM limited by pain ~10 to 30 degrees but not further testing other than with transfers.  MMT: at least 1/5 throughout but not further tested due to pain    Cervical / Trunk Assessment Cervical / Trunk Assessment: Normal;Other exceptions Cervical / Trunk Exceptions: abdomen appears tight -pt with c/o tightness/gas  Communication   Communication Communication: Other (comment) (Speaks arabic - Family interpreting per their preference)    Cognition Arousal: Alert Behavior During Therapy: Restless   PT - Cognitive impairments: No apparent impairments                       PT - Cognition Comments: did note hx of dementia Following commands: Intact  Cueing       General Comments   In supine pt's major complaint was calf pain and cramping on his post op L LE.  With testing pt does have edema -as expected post op, but his calf pain is severe.  With light touch pt screaming and withdrawing in pain. Additionally, screaming and grimacing with ankle DF and PF.  Pt additionally with shortness of breath that he reports is not baseline, possibly due to pain but sats are 90% on RA.  Pt was on 2 L at arrival and left on 2 L with sats 93%.  No c/o chest pain.     Additionally, pt with significant dizziness (spinning) with transfers, standing, and laying flat.  Pt denied dizziness prior to sx but later did note hx of vertigo, taking meclizine , and of BPPV.  Did not note any nystagmus during session -but did not formally assess vestibular system at this time (limited by pain and other  symptoms).  Did monitor for orthostatic hypotension -which was negative with BP's consistently 120's-130's/90's-100      Notified RN of severe calf pain, SOB, dizziness, all above comments and she notified PA.    Exercises     Assessment/Plan    PT Assessment Patient needs continued PT services  PT Problem List Decreased strength;Decreased mobility;Decreased range of motion;Decreased activity tolerance;Decreased balance;Decreased knowledge of use of DME;Pain;Cardiopulmonary status limiting activity       PT Treatment Interventions DME instruction;Therapeutic exercise;Gait training;Stair training;Functional mobility training;Therapeutic activities;Patient/family education;Modalities;Balance training    PT Goals (Current goals can be found in the Care Plan section)  Acute Rehab PT Goals Patient Stated Goal: decrease pain PT Goal Formulation: With patient/family Time For Goal Achievement: 05/15/24 Potential to Achieve Goals: Good    Frequency 7X/week     Co-evaluation               AM-PAC PT 6 Clicks Mobility  Outcome Measure Help needed turning from your back to your side while in a flat bed without using bedrails?: A Lot Help needed moving from lying on your back to sitting on the side of a flat bed without using bedrails?: A Lot Help needed moving to and from a bed to a chair (including a wheelchair)?: A Lot Help needed standing up from a chair using your arms (e.g., wheelchair or bedside chair)?: A Lot Help needed to walk in hospital room?: Total Help needed climbing 3-5 steps with a railing? : Total 6 Click Score: 10    End of Session Equipment Utilized During Treatment: Gait belt Activity Tolerance: Patient limited by pain;Other (comment) (Limited by pain, calf pain, and SOB, and dizzy) Patient left: in bed;with call bell/phone within reach;with bed alarm set;with family/visitor present;Other (comment) (did not apply SCD to L LE due to severe calf pain) Nurse  Communication: Mobility status;Other (comment) (concerns during therapy, see comments) PT Visit Diagnosis: Other abnormalities of gait and mobility (R26.89);Muscle weakness (generalized) (M62.81);Pain Pain - Right/Left: Left Pain - part of body: Knee    Time: 8952-8873 PT Time Calculation (min) (ACUTE ONLY): 39 min   Charges:   PT Evaluation $PT Eval Moderate Complexity: 1 Mod PT Treatments $Therapeutic Activity: 23-37 mins PT General Charges $$ ACUTE PT VISIT: 1 Visit         [place], PT Acute Rehab [employer] [facility] [phone]   [name] 05/01/2024, 12:59 PM

## 2024-05-01 NOTE — Discharge Summary (Deleted)
 Patient ID: Brandon Clark MRN: [id number] DOB/AGE: December 09, 1940 83 y.o.  Admit date: 04/30/2024 Discharge date: 05/01/2024  Admission Diagnoses:  Principal Problem:   Primary localized osteoarthritis of left knee Active Problems:   Total knee replacement status, left   Discharge Diagnoses:  Same  Past Medical History:  Diagnosis Date   Acid reflux    Arthritis    BPH (benign prostatic hyperplasia)    Dementia (HCC)    Hiatal hernia    Hyperlipidemia    Hypertension    Pre-diabetes    Sleep apnea    sometimes does not use d/t poor fitting of the mask   Ulcer     Surgeries: Procedure(s): ARTHROPLASTY, KNEE, TOTAL on 04/30/2024   Consultants:   Discharged Condition: Improved  Hospital Course: Brandon Clark is an 83 y.o. male who was admitted 04/30/2024 for operative treatment ofPrimary localized osteoarthritis of left knee. Patient has severe unremitting pain that affects sleep, daily activities, and work/hobbies. After pre-op clearance the patient was taken to the operating room on 04/30/2024 and underwent  Procedure(s): ARTHROPLASTY, KNEE, TOTAL.    Patient was given perioperative antibiotics:  Anti-infectives (From admission, onward)    Start     Dose/Rate Route Frequency Ordered Stop   04/30/24 1830  ceFAZolin  (ANCEF ) IVPB 2g/100 mL premix        2 g 200 mL/hr over 30 Minutes Intravenous Every 6 hours 04/30/24 1454 05/01/24 0330   04/30/24 1030  ceFAZolin  (ANCEF ) IVPB 3g/150 mL premix        3 g 300 mL/hr over 30 Minutes Intravenous On call to O.R. 04/30/24 1017 04/30/24 1230        Patient was given sequential compression devices, early ambulation, and chemoprophylaxis to prevent DVT.  Patient benefited maximally from hospital stay and there were no complications.    Recent vital signs: Patient Vitals for the past 24 hrs:  BP Temp Temp src Pulse Resp SpO2 Height Weight  05/01/24 0605 (!) 135/92 97.6 F (36.4 C) -- 92 16 94 % -- --   05/01/24 0128 (!) 124/90 97.9 F (36.6 C) -- 96 15 97 % -- --  04/30/24 2151 (!) 147/103 97.8 F (36.6 C) -- (!) 107 15 96 % -- --  04/30/24 1940 (!) 149/99 97.8 F (36.6 C) Oral 87 17 96 % -- --  04/30/24 1733 -- -- -- -- -- -- 6' (1.829 m) --  04/30/24 1711 (!) 135/90 97.8 F (36.6 C) Axillary 89 17 99 % -- --  04/30/24 1700 133/84 -- -- 84 17 91 % -- --  04/30/24 1645 (!) 112/97 -- -- 86 16 95 % -- --  04/30/24 1630 (!) 130/98 -- -- 89 19 92 % -- --  04/30/24 1615 (!) 141/99 -- -- 90 15 93 % -- --  04/30/24 1600 (!) 133/95 -- -- 93 17 93 % -- --  04/30/24 1552 -- -- -- 92 11 94 % -- --  04/30/24 1545 (!) 134/92 -- -- 90 19 94 % -- --  04/30/24 1530 (!) 141/101 -- -- 93 16 93 % -- --  04/30/24 1515 (!) 142/109 -- -- 94 17 93 % -- --  04/30/24 1500 (!) 146/97 -- -- 97 (!) 21 91 % -- --  04/30/24 1448 (!) 139/96 -- -- 99 (!) 24 93 % -- --  04/30/24 1443 (!) 161/99 98.2 F (36.8 C) -- 75 19 93 % -- --  04/30/24 1140 (!) 130/94 -- -- 86 15 98 % -- --  04/30/24 1135 (!) 150/100 -- -- 85 16 95 % -- --  04/30/24 1056 -- -- -- -- -- -- -- 120.7 kg  04/30/24 1019 (!) 149/100 98.5 F (36.9 C) Oral 93 18 96 % -- --     Recent laboratory studies: No results for input(s): WBC, HGB, HCT, PLT, NA, K, CL, CO2, BUN, CREATININE, GLUCOSE, INR, CALCIUM in the last 72 hours.  Invalid input(s): PT, 2   Discharge Medications:   Allergies as of 05/01/2024       Reactions   Ondansetron  Itching, Swelling, Other (See Comments)   Swelling of the eyes, but this is questioned in 2025.   Bovine (beef) Protein-containing Drug Products Other (See Comments)   Chicken Protein Other (See Comments)   Fish Protein-containing Drug Products Other (See Comments)   Porcine (pork) Protein-containing Drug Products Other (See Comments)        Medication List     STOP taking these medications    diclofenac  75 MG EC tablet Commonly known as: VOLTAREN    naproxen  500 MG  tablet Commonly known as: NAPROSYN    predniSONE  10 MG tablet Commonly known as: DELTASONE        TAKE these medications    acetaminophen  500 MG tablet Commonly known as: TYLENOL  Take 500-1,000 mg by mouth every 6 (six) hours as needed.   AeroChamber Plus inhaler Use with inhaler   albuterol  108 (90 Base) MCG/ACT inhaler Commonly known as: VENTOLIN  HFA Inhale 1-2 puffs into the lungs every 4 (four) hours as needed for wheezing or shortness of breath.   amitriptyline 25 MG tablet Commonly known as: ELAVIL Take 25 mg by mouth at bedtime.   aspirin  EC 81 MG tablet Take 1 tablet (81 mg total) by mouth 2 (two) times daily after a meal. For 2 weeks then once a day for 2 weeks for DVT prevention. What changed:  medication strength how much to take when to take this additional instructions   azithromycin  250 MG tablet Commonly known as: ZITHROMAX  Take 1 tablet (250 mg total) by mouth daily. Take first 2 tablets together, then 1 every day until finished.   benzonatate  200 MG capsule Commonly known as: TESSALON  Take 1 capsule (200 mg total) by mouth 3 (three) times daily as needed for cough.   cetirizine  5 MG tablet Commonly known as: ZYRTEC  Take 1 tablet (5 mg total) by mouth daily.   dicyclomine  20 MG tablet Commonly known as: BENTYL  Take 1 tablet (20 mg total) by mouth 2 (two) times daily.   donepezil  10 MG tablet Commonly known as: Aricept  Take 1 tablet (10 mg total) by mouth at bedtime.   finasteride  5 MG tablet Commonly known as: Proscar  Take 1 tablet (5 mg total) by mouth daily.   fluticasone  50 MCG/ACT nasal spray Commonly known as: FLONASE  Place 2 sprays into both nostrils daily.   hydrochlorothiazide  12.5 MG tablet Commonly known as: HYDRODIURIL  Take 1 tablet (12.5 mg total) by mouth daily.   HYDROcodone -acetaminophen  5-325 MG tablet Commonly known as: NORCO/VICODIN Take 1-2 tablets by mouth every 6 (six) hours as needed for moderate pain (pain score  4-6) or severe pain (pain score 7-10) (post op pain. Max 6 pills per day).   hydrOXYzine 25 MG tablet Commonly known as: ATARAX Take 25 mg by mouth 3 (three) times daily as needed.   lidocaine  5 % Commonly known as: Lidoderm  Place 1 patch onto the skin daily. Remove & Discard patch within 12 hours or as directed by MD  magnesium  oxide 400 (240 Mg) MG tablet Commonly known as: MAG-OX Take 400 mg by mouth daily.   meclizine  12.5 MG tablet Commonly known as: ANTIVERT  Take 1 tablet (12.5 mg total) by mouth 3 (three) times daily as needed for dizziness.   memantine 10 MG tablet Commonly known as: NAMENDA Take 10 mg by mouth 2 (two) times daily.   methocarbamol  500 MG tablet Commonly known as: ROBAXIN  Take 1 tablet (500 mg total) by mouth every 6 (six) hours as needed for muscle spasms. What changed: when to take this   pyridOXINE 25 MG tablet Commonly known as: VITAMIN B6 Take 25 mg by mouth daily.   simvastatin  10 MG tablet Commonly known as: ZOCOR  Take 1 tablet (10 mg total) by mouth at bedtime.   tamsulosin  0.4 MG Caps capsule Commonly known as: FLOMAX  Take 1 capsule (0.4 mg total) by mouth daily.   traZODone  50 MG tablet Commonly known as: DESYREL  Take 1 tablet (50 mg total) by mouth at bedtime as needed for sleep.               Durable Medical Equipment  (From admission, onward)           Start     Ordered   04/30/24 1719  DME Walker rolling  Once       Question:  Patient needs a walker to treat with the following condition  Answer:  Primary osteoarthritis of left knee   04/30/24 1718   04/30/24 1719  DME 3 n 1  Once        04/30/24 1718   04/30/24 1719  DME Bedside commode  Once       Question:  Patient needs a bedside commode to treat with the following condition  Answer:  Primary osteoarthritis of left knee   04/30/24 1718            Diagnostic Studies: No results found.  Disposition: Discharge disposition: 01-Home or Self  Care       Discharge Instructions     Call MD / Call 911   Complete by: As directed    If you experience chest pain or shortness of breath, CALL 911 and be transported to the hospital emergency room.  If you develope a fever above 101 F, pus (white drainage) or increased drainage or redness at the wound, or calf pain, call your surgeon's office.   Call MD / Call 911   Complete by: As directed    If you experience chest pain or shortness of breath, CALL 911 and be transported to the hospital emergency room.  If you develope a fever above 101 F, pus (white drainage) or increased drainage or redness at the wound, or calf pain, call your surgeon's office.   Constipation Prevention   Complete by: As directed    Drink plenty of fluids.  Prune juice may be helpful.  You may use a stool softener, such as Colace (over the counter) 100 mg twice a day.  Use MiraLax (over the counter) for constipation as needed.   Constipation Prevention   Complete by: As directed    Drink plenty of fluids.  Prune juice may be helpful.  You may use a stool softener, such as Colace (over the counter) 100 mg twice a day.  Use MiraLax (over the counter) for constipation as needed.   Diet - low sodium heart healthy   Complete by: As directed    Diet - low sodium heart healthy  Complete by: As directed    Discharge instructions   Complete by: As directed    INSTRUCTIONS AFTER JOINT REPLACEMENT   Remove items at home which could result in a fall. This includes throw rugs or furniture in walking pathways ICE to the affected joint every three hours while awake for 30 minutes at a time, for at least the first 3-5 days, and then as needed for pain and swelling.  Continue to use ice for pain and swelling. You may notice swelling that will progress down to the foot and ankle.  This is normal after surgery.  Elevate your leg when you are not up walking on it.   Continue to use the breathing machine you got in the hospital  (incentive spirometer) which will help keep your temperature down.  It is common for your temperature to cycle up and down following surgery, especially at night when you are not up moving around and exerting yourself.  The breathing machine keeps your lungs expanded and your temperature down.   DIET:  As you were doing prior to hospitalization, we recommend a well-balanced diet.  DRESSING / WOUND CARE / SHOWERING  You may shower 3 days after surgery, but keep the wounds dry during showering.  You may use an occlusive plastic wrap (Press'n Seal for example), NO SOAKING/SUBMERGING IN THE BATHTUB.  If the bandage gets wet, change with a clean dry gauze.  If the incision gets wet, pat the wound dry with a clean towel.  ACTIVITY  Increase activity slowly as tolerated, but follow the weight bearing instructions below.   No driving for 6 weeks or until further direction given by your physician.  You cannot drive while taking narcotics.  No lifting or carrying greater than 10 lbs. until further directed by your surgeon. Avoid periods of inactivity such as sitting longer than an hour when not asleep. This helps prevent blood clots.  You may return to work once you are authorized by your doctor.     WEIGHT BEARING   Weight bearing as tolerated with assist device (walker, cane, etc) as directed, use it as long as suggested by your surgeon or therapist, typically at least 4-6 weeks.   EXERCISES  Results after joint replacement surgery are often greatly improved when you follow the exercise, range of motion and muscle strengthening exercises prescribed by your doctor. Safety measures are also important to protect the joint from further injury. Any time any of these exercises cause you to have increased pain or swelling, decrease what you are doing until you are comfortable again and then slowly increase them. If you have problems or questions, call your caregiver or physical therapist for advice.    Rehabilitation is important following a joint replacement. After just a few days of immobilization, the muscles of the leg can become weakened and shrink (atrophy).  These exercises are designed to build up the tone and strength of the thigh and leg muscles and to improve motion. Often times heat used for twenty to thirty minutes before working out will loosen up your tissues and help with improving the range of motion but do not use heat for the first two weeks following surgery (sometimes heat can increase post-operative swelling).   These exercises can be done on a training (exercise) mat, on the floor, on a table or on a bed. Use whatever works the best and is most comfortable for you.    Use music or television while you are exercising so that  the exercises are a pleasant break in your day. This will make your life better with the exercises acting as a break in your routine that you can look forward to.   Perform all exercises about fifteen times, three times per day or as directed.  You should exercise both the operative leg and the other leg as well.  Exercises include:   Quad Sets - Tighten up the muscle on the front of the thigh (Quad) and hold for 5-10 seconds.   Straight Leg Raises - With your knee straight (if you were given a brace, keep it on), lift the leg to 60 degrees, hold for 3 seconds, and slowly lower the leg.  Perform this exercise against resistance later as your leg gets stronger.  Leg Slides: Lying on your back, slowly slide your foot toward your buttocks, bending your knee up off the floor (only go as far as is comfortable). Then slowly slide your foot back down until your leg is flat on the floor again.  Angel Wings: Lying on your back spread your legs to the side as far apart as you can without causing discomfort.  Hamstring Strength:  Lying on your back, push your heel against the floor with your leg straight by tightening up the muscles of your buttocks.  Repeat, but this  time bend your knee to a comfortable angle, and push your heel against the floor.  You may put a pillow under the heel to make it more comfortable if necessary.   A rehabilitation program following joint replacement surgery can speed recovery and prevent re-injury in the future due to weakened muscles. Contact your doctor or a physical therapist for more information on knee rehabilitation.    CONSTIPATION  Constipation is defined medically as fewer than three stools per week and severe constipation as less than one stool per week.  Even if you have a regular bowel pattern at home, your normal regimen is likely to be disrupted due to multiple reasons following surgery.  Combination of anesthesia, postoperative narcotics, change in appetite and fluid intake all can affect your bowels.   YOU MUST use at least one of the following options; they are listed in order of increasing strength to get the job done.  They are all available over the counter, and you may need to use some, POSSIBLY even all of these options:    Drink plenty of fluids (prune juice may be helpful) and high fiber foods Colace 100 mg by mouth twice a day  Senokot for constipation as directed and as needed Dulcolax (bisacodyl ), take with full glass of water  Miralax (polyethylene glycol) once or twice a day as needed.  If you have tried all these things and are unable to have a bowel movement in the first 3-4 days after surgery call either your surgeon or your primary doctor.    If you experience loose stools or diarrhea, hold the medications until you stool forms back up.  If your symptoms do not get better within 1 week or if they get worse, check with your doctor.  If you experience the worst abdominal pain ever or develop nausea or vomiting, please contact the office immediately for further recommendations for treatment.   ITCHING:  If you experience itching with your medications, try taking only a single pain pill, or even  half a pain pill at a time.  You can also use Benadryl  over the counter for itching or also to help with sleep.  TED HOSE STOCKINGS:  Use stockings on both legs until for at least 2 weeks or as directed by physician office. They may be removed at night for sleeping.  MEDICATIONS:  See your medication summary on the After Visit Summary that nursing will review with you.  You may have some home medications which will be placed on hold until you complete the course of blood thinner medication.  It is important for you to complete the blood thinner medication as prescribed.  PRECAUTIONS:  If you experience chest pain or shortness of breath - call 911 immediately for transfer to the hospital emergency department.   If you develop a fever greater that 101 F, purulent drainage from wound, increased redness or drainage from wound, foul odor from the wound/dressing, or calf pain - CONTACT YOUR SURGEON.                                                   FOLLOW-UP APPOINTMENTS:  If you do not already have a post-op appointment, please call the office for an appointment to be seen by your surgeon.  Guidelines for how soon to be seen are listed in your After Visit Summary, but are typically between 1-4 weeks after surgery.  OTHER INSTRUCTIONS:   Knee Replacement:  Do not place pillow under knee, focus on keeping the knee straight while resting. CPM instructions: 0-90 degrees, 2 hours in the morning, 2 hours in the afternoon, and 2 hours in the evening. Place foam block, curve side up under heel at all times except when in CPM or when walking.  DO NOT modify, tear, cut, or change the foam block in any way.  POST-OPERATIVE OPIOID TAPER INSTRUCTIONS: It is important to wean off of your opioid medication as soon as possible. If you do not need pain medication after your surgery it is ok to stop day one. Opioids include: Codeine , Hydrocodone (Norco, Vicodin), Oxycodone (Percocet, oxycontin ) and hydromorphone   amongst others.  Long term and even short term use of opiods can cause: Increased pain response Dependence Constipation Depression Respiratory depression And more.  Withdrawal symptoms can include Flu like symptoms Nausea, vomiting And more Techniques to manage these symptoms Hydrate well Eat regular healthy meals Stay active Use relaxation techniques(deep breathing, meditating, yoga) Do Not substitute Alcohol to help with tapering If you have been on opioids for less than two weeks and do not have pain than it is ok to stop all together.  Plan to wean off of opioids This plan should start within one week post op of your joint replacement. Maintain the same interval or time between taking each dose and first decrease the dose.  Cut the total daily intake of opioids by one tablet each day Next start to increase the time between doses. The last dose that should be eliminated is the evening dose.     MAKE SURE YOU:  Understand these instructions.  Get help right away if you are not doing well or get worse.    Thank you for letting us  be a part of your medical care team.  It is a privilege we respect greatly.  We hope these instructions will help you stay on track for a fast and full recovery!   Discharge instructions   Complete by: As directed    INSTRUCTIONS AFTER JOINT REPLACEMENT   Remove items at home  which could result in a fall. This includes throw rugs or furniture in walking pathways ICE to the affected joint every three hours while awake for 30 minutes at a time, for at least the first 3-5 days, and then as needed for pain and swelling.  Continue to use ice for pain and swelling. You may notice swelling that will progress down to the foot and ankle.  This is normal after surgery.  Elevate your leg when you are not up walking on it.   Continue to use the breathing machine you got in the hospital (incentive spirometer) which will help keep your temperature down.  It is  common for your temperature to cycle up and down following surgery, especially at night when you are not up moving around and exerting yourself.  The breathing machine keeps your lungs expanded and your temperature down.   DIET:  As you were doing prior to hospitalization, we recommend a well-balanced diet.  DRESSING / WOUND CARE / SHOWERING  You may shower 3 days after surgery, but keep the wounds dry during showering.  You may use an occlusive plastic wrap (Press'n Seal for example), NO SOAKING/SUBMERGING IN THE BATHTUB.  If the bandage gets wet, change with a clean dry gauze.  If the incision gets wet, pat the wound dry with a clean towel.  ACTIVITY  Increase activity slowly as tolerated, but follow the weight bearing instructions below.   No driving for 6 weeks or until further direction given by your physician.  You cannot drive while taking narcotics.  No lifting or carrying greater than 10 lbs. until further directed by your surgeon. Avoid periods of inactivity such as sitting longer than an hour when not asleep. This helps prevent blood clots.  You may return to work once you are authorized by your doctor.     WEIGHT BEARING   Weight bearing as tolerated with assist device (walker, cane, etc) as directed, use it as long as suggested by your surgeon or therapist, typically at least 4-6 weeks.   EXERCISES  Results after joint replacement surgery are often greatly improved when you follow the exercise, range of motion and muscle strengthening exercises prescribed by your doctor. Safety measures are also important to protect the joint from further injury. Any time any of these exercises cause you to have increased pain or swelling, decrease what you are doing until you are comfortable again and then slowly increase them. If you have problems or questions, call your caregiver or physical therapist for advice.   Rehabilitation is important following a joint replacement. After just a few  days of immobilization, the muscles of the leg can become weakened and shrink (atrophy).  These exercises are designed to build up the tone and strength of the thigh and leg muscles and to improve motion. Often times heat used for twenty to thirty minutes before working out will loosen up your tissues and help with improving the range of motion but do not use heat for the first two weeks following surgery (sometimes heat can increase post-operative swelling).   These exercises can be done on a training (exercise) mat, on the floor, on a table or on a bed. Use whatever works the best and is most comfortable for you.    Use music or television while you are exercising so that the exercises are a pleasant break in your day. This will make your life better with the exercises acting as a break in your routine that you can  look forward to.   Perform all exercises about fifteen times, three times per day or as directed.  You should exercise both the operative leg and the other leg as well.  Exercises include:   Quad Sets - Tighten up the muscle on the front of the thigh (Quad) and hold for 5-10 seconds.   Straight Leg Raises - With your knee straight (if you were given a brace, keep it on), lift the leg to 60 degrees, hold for 3 seconds, and slowly lower the leg.  Perform this exercise against resistance later as your leg gets stronger.  Leg Slides: Lying on your back, slowly slide your foot toward your buttocks, bending your knee up off the floor (only go as far as is comfortable). Then slowly slide your foot back down until your leg is flat on the floor again.  Angel Wings: Lying on your back spread your legs to the side as far apart as you can without causing discomfort.  Hamstring Strength:  Lying on your back, push your heel against the floor with your leg straight by tightening up the muscles of your buttocks.  Repeat, but this time bend your knee to a comfortable angle, and push your heel against the  floor.  You may put a pillow under the heel to make it more comfortable if necessary.   A rehabilitation program following joint replacement surgery can speed recovery and prevent re-injury in the future due to weakened muscles. Contact your doctor or a physical therapist for more information on knee rehabilitation.    CONSTIPATION  Constipation is defined medically as fewer than three stools per week and severe constipation as less than one stool per week.  Even if you have a regular bowel pattern at home, your normal regimen is likely to be disrupted due to multiple reasons following surgery.  Combination of anesthesia, postoperative narcotics, change in appetite and fluid intake all can affect your bowels.   YOU MUST use at least one of the following options; they are listed in order of increasing strength to get the job done.  They are all available over the counter, and you may need to use some, POSSIBLY even all of these options:    Drink plenty of fluids (prune juice may be helpful) and high fiber foods Colace 100 mg by mouth twice a day  Senokot for constipation as directed and as needed Dulcolax (bisacodyl ), take with full glass of water  Miralax (polyethylene glycol) once or twice a day as needed.  If you have tried all these things and are unable to have a bowel movement in the first 3-4 days after surgery call either your surgeon or your primary doctor.    If you experience loose stools or diarrhea, hold the medications until you stool forms back up.  If your symptoms do not get better within 1 week or if they get worse, check with your doctor.  If you experience the worst abdominal pain ever or develop nausea or vomiting, please contact the office immediately for further recommendations for treatment.   ITCHING:  If you experience itching with your medications, try taking only a single pain pill, or even half a pain pill at a time.  You can also use Benadryl  over the counter for  itching or also to help with sleep.   TED HOSE STOCKINGS:  Use stockings on both legs until for at least 2 weeks or as directed by physician office. They may be removed at night for  sleeping.  MEDICATIONS:  See your medication summary on the After Visit Summary that nursing will review with you.  You may have some home medications which will be placed on hold until you complete the course of blood thinner medication.  It is important for you to complete the blood thinner medication as prescribed.  PRECAUTIONS:  If you experience chest pain or shortness of breath - call 911 immediately for transfer to the hospital emergency department.   If you develop a fever greater that 101 F, purulent drainage from wound, increased redness or drainage from wound, foul odor from the wound/dressing, or calf pain - CONTACT YOUR SURGEON.                                                   FOLLOW-UP APPOINTMENTS:  If you do not already have a post-op appointment, please call the office for an appointment to be seen by your surgeon.  Guidelines for how soon to be seen are listed in your After Visit Summary, but are typically between 1-4 weeks after surgery.  OTHER INSTRUCTIONS:   Knee Replacement:  Do not place pillow under knee, focus on keeping the knee straight while resting. CPM instructions: 0-90 degrees, 2 hours in the morning, 2 hours in the afternoon, and 2 hours in the evening. Place foam block, curve side up under heel at all times except when in CPM or when walking.  DO NOT modify, tear, cut, or change the foam block in any way.  POST-OPERATIVE OPIOID TAPER INSTRUCTIONS: It is important to wean off of your opioid medication as soon as possible. If you do not need pain medication after your surgery it is ok to stop day one. Opioids include: Codeine , Hydrocodone (Norco, Vicodin), Oxycodone (Percocet, oxycontin ) and hydromorphone  amongst others.  Long term and even short term use of opiods can  cause: Increased pain response Dependence Constipation Depression Respiratory depression And more.  Withdrawal symptoms can include Flu like symptoms Nausea, vomiting And more Techniques to manage these symptoms Hydrate well Eat regular healthy meals Stay active Use relaxation techniques(deep breathing, meditating, yoga) Do Not substitute Alcohol to help with tapering If you have been on opioids for less than two weeks and do not have pain than it is ok to stop all together.  Plan to wean off of opioids This plan should start within one week post op of your joint replacement. Maintain the same interval or time between taking each dose and first decrease the dose.  Cut the total daily intake of opioids by one tablet each day Next start to increase the time between doses. The last dose that should be eliminated is the evening dose.     MAKE SURE YOU:  Understand these instructions.  Get help right away if you are not doing well or get worse.    Thank you for letting us  be a part of your medical care team.  It is a privilege we respect greatly.  We hope these instructions will help you stay on track for a fast and full recovery!   Increase activity slowly as tolerated   Complete by: As directed    Increase activity slowly as tolerated   Complete by: As directed    Post-operative opioid taper instructions:   Complete by: As directed    POST-OPERATIVE OPIOID TAPER INSTRUCTIONS: It is important to  wean off of your opioid medication as soon as possible. If you do not need pain medication after your surgery it is ok to stop day one. Opioids include: Codeine , Hydrocodone (Norco, Vicodin), Oxycodone (Percocet, oxycontin ) and hydromorphone  amongst others.  Long term and even short term use of opiods can cause: Increased pain response Dependence Constipation Depression Respiratory depression And more.  Withdrawal symptoms can include Flu like symptoms Nausea, vomiting And  more Techniques to manage these symptoms Hydrate well Eat regular healthy meals Stay active Use relaxation techniques(deep breathing, meditating, yoga) Do Not substitute Alcohol to help with tapering If you have been on opioids for less than two weeks and do not have pain than it is ok to stop all together.  Plan to wean off of opioids This plan should start within one week post op of your joint replacement. Maintain the same interval or time between taking each dose and first decrease the dose.  Cut the total daily intake of opioids by one tablet each day Next start to increase the time between doses. The last dose that should be eliminated is the evening dose.      Post-operative opioid taper instructions:   Complete by: As directed    POST-OPERATIVE OPIOID TAPER INSTRUCTIONS: It is important to wean off of your opioid medication as soon as possible. If you do not need pain medication after your surgery it is ok to stop day one. Opioids include: Codeine , Hydrocodone (Norco, Vicodin), Oxycodone (Percocet, oxycontin ) and hydromorphone  amongst others.  Long term and even short term use of opiods can cause: Increased pain response Dependence Constipation Depression Respiratory depression And more.  Withdrawal symptoms can include Flu like symptoms Nausea, vomiting And more Techniques to manage these symptoms Hydrate well Eat regular healthy meals Stay active Use relaxation techniques(deep breathing, meditating, yoga) Do Not substitute Alcohol to help with tapering If you have been on opioids for less than two weeks and do not have pain than it is ok to stop all together.  Plan to wean off of opioids This plan should start within one week post op of your joint replacement. Maintain the same interval or time between taking each dose and first decrease the dose.  Cut the total daily intake of opioids by one tablet each day Next start to increase the time between doses. The last  dose that should be eliminated is the evening dose.           Follow-up Information     Sheril Coy, MD. Schedule an appointment as soon as possible for a visit in 2 week(s).   Specialty: Orthopedic Surgery Contact information: [address] [place] [address] [phone]                  Signed: Prentice Mt Kaylon Laroche 05/01/2024, 8:12 AM

## 2024-05-01 NOTE — Progress Notes (Signed)
 Physical Therapy Treatment Patient Details Name: Brandon Clark MRN: [id number] DOB: 19-Aug-1940 Today's Date: 05/01/2024   History of Present Illness Pt is 83 yo male presents to therapy s/p L TKA on 04/30/2024 due to failure of conservative measures. Pt PMH includes but is not limited to: dementia, s/p Nissen fundoplication procedure, hernia, OSA, BPPV, HTN, ataxia, DOE, arthritis, and B UE and LE pain.    PT Comments  Pt limited by severe dizziness and pain, with dizziness being main limiting factor this afternoon. He was only able to sit EOB with mod A before having to return to supine.  Pt did provide varied answers throughout treatment - noted hx of dementia.  Assessment difficult at times due to language barrier (used interpretor), difficulty focusing on task at hand, and varied answers.    Calf Pain - pt still w/ c/o calf pain. Severe with light palpation and reports increased with dorsiflexion.  Described as tight.  Notified PA.    Shortness of breath: similar to am, denied any chest pain, on 2 L with sats 92% - notified PA  C/o Abdominal distention - pt with lots of belching with movement, notified RN and PA.   Dizziness: Pt mainly limited by his dizziness.  Noted hx of BPPV/vertigo but pt initially denied.  He later reports he is unable to lay flat baseline due to dizziness.  Upon sitting, pt with severe dizziness, spinning, and swaying.  Cues for relaxation, breathing, and focus points.  BP was 150/80.   Pt did stop swaying but then reports can't see at all.  So returned to supine.  Again a while to get pt to relax. Pt with c/o headache in front and back.  Pt report vision improved in supine and when asked to further describe how it was in sitting said very blurry, like looking into sun.  Attempted extra-ocular exam and vestibular screen.  Pt had difficulty tolerating extra-ocular exam due to dizziness. Began to test [facility] but pt unable to tolerate at all with c/o  dizziness and anxiety. Potential of positioning during surgery aggravating pt's BPPV vs other cause of dizziness.  Does not appear to be orthostatic.  Notified PA of findings and pt's c/o blurry vision in sitting with stable BP.    If plan is discharge home, recommend the following: A lot of help with walking and/or transfers;A lot of help with bathing/dressing/bathroom;Assistance with cooking/housework;Help with stairs or ramp for entrance   Can travel by private vehicle        Equipment Recommendations  None recommended by PT    Recommendations for Other Services       Precautions / Restrictions Precautions Precautions: Fall;Knee Restrictions Weight Bearing Restrictions Per Provider Order: Yes LLE Weight Bearing Per Provider Order: Weight bearing as tolerated     Mobility  Bed Mobility Overal bed mobility: Needs Assistance Bed Mobility: Supine to Sit, Sit to Supine     Supine to sit: Mod assist Sit to supine: Mod assist   General bed mobility comments: assist for legs and trunk - pt with severe dizziness upon sitting and not able to continue (see comments)     Stairs             Wheelchair Mobility     Tilt Bed    Modified Rankin (Stroke Patients Only)       Balance Overall balance assessment: Needs assistance Sitting-balance support: Bilateral upper extremity supported Sitting balance-Leahy Scale: Poor Sitting balance - Comments: UE support, swaying,  dizzy, mod A , posterior bias                              Communication Communication Communication: Other (comment) (Speaks arabic - Family interpreting per their preference) Factors Affecting Communication: Other (comment) (Utilized AMN interpretor : Doyal [phone])  Cognition Arousal: Alert Behavior During Therapy: Anxious   PT - Cognitive impairments: History of cognitive impairments                       PT - Cognition Comments: did note hx of dementia and pt providing  varied answers during exam (ex: denied hx vertigo later reports can't lay flat b/c vertigo) Following commands: Intact      Cueing    Exercises      General Comments  Vestibular Screen: Pt with hx of BPPV and taking meclazine per chart (he initially denied but later reports unable to lay flat due to dizziness).  However, reports never anything like this with sitting /standing.   Resting nystagmus: negative Gaze Induced nystagmus: pt reports dizzy looking to side (L worse than R) and not tolerating for long, did not note nystagmus but pt blinking Smooth pursuit: saccadic like movement, low tolerance for testing due to dizzy, but eyes were coordinated Head Thrust: able to follow commands for test Gaze Stabilization: not able to follow commands or tolerate Test of Skew : Negative HINTS: Not able to fully test (can't tolerate head thrust, and limited assessment for gaze induced nystagmus)  Explained that symptoms potentially from BPPV and would need to attempt [facility] or modified positioning but would still need to be flat.  Attempting but just with sitting pt belching, anxious and dizzy and not able to complete testing.       Pertinent Vitals/Pain Pain Assessment Pain Assessment: 0-10 Pain Score: 9  Pain Location: L calf , L knee Pain Descriptors / Indicators: Discomfort, Throbbing, Sharp, Grimacing, Moaning, Guarding Pain Intervention(s): Limited activity within patient's tolerance, Monitored during session, Premedicated before session, Repositioned, Ice applied    Home Living               Alternate Level Stairs-Number of Steps: Pt typically stays upstairs but has full bath downstairs and can convert office to bedroom if needed.  Full flight of steps          Prior Function            PT Goals (current goals can now be found in the care plan section) Acute Rehab PT Goals Patient Stated Goal: decrease pain PT Goal Formulation: With patient/family Time For Goal  Achievement: 05/15/24 Potential to Achieve Goals: Good Progress towards PT goals: Not progressing toward goals - comment (limited by pain/dizzy)    Frequency    7X/week      PT Plan      Co-evaluation              AM-PAC PT 6 Clicks Mobility   Outcome Measure  Help needed turning from your back to your side while in a flat bed without using bedrails?: A Lot Help needed moving from lying on your back to sitting on the side of a flat bed without using bedrails?: A Lot Help needed moving to and from a bed to a chair (including a wheelchair)?: Total Help needed standing up from a chair using your arms (e.g., wheelchair or bedside chair)?: Total Help needed to walk in  hospital room?: Total Help needed climbing 3-5 steps with a railing? : Total 6 Click Score: 8    End of Session Equipment Utilized During Treatment: Gait belt Activity Tolerance: Other (comment) (limited by pain and dizziness) Patient left: in bed;with call bell/phone within reach;with bed alarm set;Other (comment) Nurse Communication: Mobility status;Other (comment) (dizzy, bloated, pain) PT Visit Diagnosis: Other abnormalities of gait and mobility (R26.89);Muscle weakness (generalized) (M62.81);Pain Pain - Right/Left: Left Pain - part of body: Knee     Time: 8557-8467 PT Time Calculation (min) (ACUTE ONLY): 50 min  Charges:    $Therapeutic Activity: 23-37 mins $Physical Performance Test: 8-22 mins PT General Charges $$ ACUTE PT VISIT: 1 Visit                     Benjiman, PT Acute Rehab [facility] Rehab [phone]    [name] 05/01/2024, 3:48 PM

## 2024-05-01 NOTE — TOC Transition Note (Signed)
 Transition of Care [facility]) - Discharge Note   Patient Details  Name: Brandon Clark MRN: [id number] Date of Birth: 12-Mar-1941  [facility] of [facility]) CM/SW Contact:  NORMAN ASPEN, LCSW Phone Number: 05/01/2024, 12:55 PM   Clinical Narrative:     Met with patient and son who confirm pt has all needed DME in the home. Pt/family uncertain about follow up therapy plan.  Contacted pt's Worker's Comp CM, Channing Coats [phone]) who confirms pt was set up with OPPT at [facility] - appointment and location information provided to pt and son.  No further IP CM needs.  Final next level of care: OP Rehab Barriers to Discharge: No Barriers Identified   Patient Goals and CMS Choice Patient states their goals for this hospitalization and ongoing recovery are:: return home          Discharge Placement                       Discharge Plan and Services Additional resources added to the After Visit Summary for                  DME Arranged: N/A DME Agency: NA                  Social Drivers of Health (SDOH) Interventions SDOH Screenings   Food Insecurity: Unknown (04/30/2024)  Housing: Unknown (04/30/2024)  Transportation Needs: Unknown (04/30/2024)  Utilities: Patient Declined (04/30/2024)  Depression (PHQ2-9): Low Risk  (10/16/2019)  Financial Resource Strain: Low Risk  (10/27/2023)   Received from [facility]  Physical Activity: Unknown (10/27/2023)   Received from [facility]  Social Connections: Patient Declined (04/30/2024)  Stress: No Stress Concern Present (10/27/2023)   Received from [facility]  Tobacco Use: Low Risk  (04/30/2024)     Readmission Risk Interventions     No data to display

## 2024-05-01 NOTE — Progress Notes (Signed)
 Subjective: 1 Day Post-Op Procedure(s) (LRB): ARTHROPLASTY, KNEE, TOTAL (Left) Patient is doing well this morning. No family at bedside. Pain is better controlled today.  Activity level:  wbat Diet tolerance:  ok Voiding:  ok Patient reports pain as mild and moderate.    Objective: Vital signs in last 24 hours: Temp:  [97.6 F (36.4 C)-98.5 F (36.9 C)] 97.6 F (36.4 C) (10/29 0605) Pulse Rate:  [75-107] 92 (10/29 0605) Resp:  [11-24] 16 (10/29 0605) BP: (112-161)/(84-109) 135/92 (10/29 0605) SpO2:  [91 %-99 %] 94 % (10/29 0605) Weight:  [120.7 kg] 120.7 kg (10/28 1056)  Labs: No results for input(s): HGB in the last 72 hours. No results for input(s): WBC, RBC, HCT, PLT in the last 72 hours. No results for input(s): NA, K, CL, CO2, BUN, CREATININE, GLUCOSE, CALCIUM in the last 72 hours. No results for input(s): LABPT, INR in the last 72 hours.  Physical Exam:  Neurologically intact ABD soft Neurovascular intact Sensation intact distally Intact pulses distally Dorsiflexion/Plantar flexion intact Incision: dressing C/D/I and no drainage No cellulitis present Compartment soft  Assessment/Plan:  1 Day Post-Op Procedure(s) (LRB): ARTHROPLASTY, KNEE, TOTAL (Left) Advance diet Up with therapy D/C IV fluids Discharge home with home health today if cleared by PT and doing well.  Continue on 81 mg asa bid for dvt prevention.  Follow up in office 2 weeks post op.    Brandon Clark 05/01/2024, 8:06 AM

## 2024-05-02 ENCOUNTER — Ambulatory Visit (HOSPITAL_COMMUNITY): Payer: Self-pay | Attending: Orthopedic Surgery

## 2024-05-02 DIAGNOSIS — M79662 Pain in left lower leg: Secondary | ICD-10-CM | POA: Insufficient documentation

## 2024-05-02 MED ORDER — KETOROLAC TROMETHAMINE 15 MG/ML IJ SOLN
15.0000 mg | Freq: Four times a day (QID) | INTRAMUSCULAR | Status: AC
  Administered 2024-05-02 – 2024-05-03 (×3): 15 mg via INTRAVENOUS
  Filled 2024-05-02 (×3): qty 1

## 2024-05-02 MED ORDER — METHOCARBAMOL 1000 MG/10ML IJ SOLN
500.0000 mg | Freq: Four times a day (QID) | INTRAMUSCULAR | Status: DC | PRN
  Administered 2024-05-06: 500 mg via INTRAVENOUS
  Filled 2024-05-02: qty 10

## 2024-05-02 MED ORDER — METHOCARBAMOL 500 MG PO TABS
750.0000 mg | ORAL_TABLET | Freq: Four times a day (QID) | ORAL | Status: DC | PRN
  Administered 2024-05-02 – 2024-05-13 (×18): 750 mg via ORAL
  Filled 2024-05-02 (×19): qty 2

## 2024-05-02 MED ORDER — APIXABAN 5 MG PO TABS
10.0000 mg | ORAL_TABLET | Freq: Two times a day (BID) | ORAL | Status: DC
Start: 2024-05-02 — End: 2024-05-03
  Administered 2024-05-02 – 2024-05-03 (×3): 10 mg via ORAL
  Filled 2024-05-02 (×3): qty 2

## 2024-05-02 MED ORDER — ENSURE SURGERY PO LIQD
237.0000 mL | Freq: Two times a day (BID) | ORAL | Status: DC
  Administered 2024-05-03 – 2024-05-10 (×8): 237 mL via ORAL
  Filled 2024-05-02 (×8): qty 237

## 2024-05-02 MED ORDER — APIXABAN 5 MG PO TABS: 5.0000 mg | ORAL_TABLET | Freq: Two times a day (BID) | ORAL | Status: DC

## 2024-05-02 NOTE — Progress Notes (Signed)
 PT Cancellation Note  Patient Details Name: Brandon Clark MRN: [id number] DOB: Jun 20, 1941   Cancelled Treatment:    Reason Eval/Treat Not Completed: Other (comment) Pt to have doppler to r/o DVT.  Will f/u for afternoon PT session. Zaliyah Meikle, PT Acute Rehab [facility] Rehab [phone]   Brandon Clark Mulberry 05/02/2024, 10:45 AM

## 2024-05-02 NOTE — Progress Notes (Signed)
 Physical Therapy Treatment Patient Details Name: Brandon Clark MRN: [id number] DOB: [birth year] Today's Date: 05/02/2024   History of Present Illness Pt is 82 yo male presents to therapy s/p L TKA on 04/30/2024 due to failure of conservative measures. U/S on 05/02/24 did reveal DVT L LE and pt started on Eliquis.  Pt PMH includes but is not limited to: dementia, s/p Nissen fundoplication procedure, hernia, OSA, BPPV, HTN, ataxia, DOE, arthritis, and B UE and LE pain.    PT Comments  Noted pt found to have DVT in L LE and to start on Eliquis.  Will hold PT mobility until tomorrow per therapy guidelines for new DVT.  Did note that pt remains short of breath on on 2 L O2 sats 92%.  Pt get SOB with talking, he is in pain that may be contributing, but with DVT did notify nurse of SOB who reports will discuss with MD.  Did perform ocular-motor assessment and HINTS screen  due to pt's dizziness yesterday.  Pt reports dizziness better today (at least when in bed).  He was able to participate with ocular-motor assessment today and everything was normal and HINTS test negative for central cause of dizziness.    Pt with ongoing c/o bloating and nausea.  Provided with ginger ale and notified RN.    If plan is discharge home, recommend the following: A lot of help with walking and/or transfers;A lot of help with bathing/dressing/bathroom;Assistance with cooking/housework;Help with stairs or ramp for entrance   Can travel by private vehicle        Equipment Recommendations  None recommended by PT    Recommendations for Other Services       Precautions / Restrictions Precautions Precautions: Fall;Knee Restrictions LLE Weight Bearing Per Provider Order: Weight bearing as tolerated     Mobility  Bed Mobility                    Transfers                        Ambulation/Gait                   Stairs             Wheelchair Mobility     Tilt  Bed    Modified Rankin (Stroke Patients Only)       Balance                                            Communication    Cognition                                        Cueing    Exercises      General Comments  Vestibular Screen:  Followed up on vestibular/ocular motor assessment today particularly after + clot in leg.   Ocular motor exam was normal and no c/o dizzienss with assessment today.  HINTS screen negative for central cause.  Did not perform Shona Leotha [place] due new DVT and no complaints of dizziness rest.       Pertinent Vitals/Pain      Home Living  Prior Function            PT Goals (current goals can now be found in the care plan section) Progress towards PT goals: Not progressing toward goals - comment (+DVT)    Frequency    7X/week      PT Plan      Co-evaluation              AM-PAC PT 6 Clicks Mobility   Outcome Measure  Help needed turning from your back to your side while in a flat bed without using bedrails?: Total Help needed moving from lying on your back to sitting on the side of a flat bed without using bedrails?: Total Help needed moving to and from a bed to a chair (including a wheelchair)?: Total Help needed standing up from a chair using your arms (e.g., wheelchair or bedside chair)?: Total Help needed to walk in hospital room?: Total Help needed climbing 3-5 steps with a railing? : Total 6 Click Score: 6    End of Session Equipment Utilized During Treatment: Gait belt Activity Tolerance: Other (comment) (only oculomotor exam and eduation) Patient left: in bed;with call bell/phone within reach;with bed alarm set Nurse Communication: Mobility status;Other (comment) (holding therapy due to pain/new DVT starting Eliquis; concern about SOB and O2 need; HINTS exam negative) PT Visit Diagnosis: Other abnormalities of gait and mobility (R26.89);Muscle  weakness (generalized) (M62.81);Pain Pain - Right/Left: Left Pain - part of body: Knee     Time: 8587-8566 PT Time Calculation (min) (ACUTE ONLY): 21 min  Charges:    $Physical Performance Test: 8-22 mins PT General Charges $$ ACUTE PT VISIT: 1 Visit                     Benjiman, PT Acute Rehab Services [facility] Re[facility] [phone]    [name] 05/02/2024, 3:03 PM

## 2024-05-02 NOTE — Progress Notes (Signed)
 Subjective: 2 Days Post-Op Procedure(s) (LRB): ARTHROPLASTY, KNEE, TOTAL (Left)  Patient continues with terrible pain in his left calf. I ordered labs overnight due to his dizziness yesterday despite normal vitals. His granddaughter was available via telephone today for translation.   Activity level:  wbat Diet tolerance:  ok Voiding:  ok Patient reports pain as moderate and severe.    Objective: Vital signs in last 24 hours: Temp:  [97.7 F (36.5 C)-98.6 F (37 C)] 98.6 F (37 C) (10/30 0520) Pulse Rate:  [82-92] 92 (10/30 0520) Resp:  [17-20] 17 (10/30 0520) BP: (120-140)/(81-89) 123/81 (10/30 0520) SpO2:  [93 %-96 %] 93 % (10/30 0520)  Labs: Recent Labs    05/01/24 1911  HGB 14.5   Recent Labs    05/01/24 1911  WBC 10.0  RBC 5.12  HCT 46.2  PLT 174   Recent Labs    05/01/24 1911  NA 138  K 4.2  CL 101  CO2 28  BUN 21  CREATININE 1.03  GLUCOSE 129*  CALCIUM 8.8*   No results for input(s): LABPT, INR in the last 72 hours.  Physical Exam:  Neurologically intact ABD soft Neurovascular intact Sensation intact distally Intact pulses distally Dorsiflexion/Plantar flexion intact Incision: dressing C/D/I and no drainage No cellulitis present Compartment soft  Assessment/Plan:  2 Days Post-Op Procedure(s) (LRB): ARTHROPLASTY, KNEE, TOTAL (Left) Advance diet Up with therapy Plan for discharge tomorrow if doing better and cleared by PT. We will order a doppler ultrasound of his left leg to rule out DVT.  I have increased his robaxin  to 750mg . I am trying to be cautious with over medicating him due to his history of dementia and also dizziness. Continue on 81mg  asa for DVT prevention.  I will see him again tomorrow morning and hopefully his pain will have decreased and that he can go home tomorrow.     Prentice Mt Georjean Toya 05/02/2024, 7:06 AM

## 2024-05-02 NOTE — Progress Notes (Signed)
 Pt was offered to have his tray sat up so he can eat he refused and stated he is dizzy he can not eat will let doctor know

## 2024-05-02 NOTE — Progress Notes (Signed)
 Got blood work results back for pt, contacted Dr and spoke with PA Prentice and explained results and he stated he will begin to start him on elquis

## 2024-05-03 ENCOUNTER — Inpatient Hospital Stay (HOSPITAL_COMMUNITY): Payer: Self-pay

## 2024-05-03 ENCOUNTER — Other Ambulatory Visit (HOSPITAL_COMMUNITY): Payer: Self-pay

## 2024-05-03 DIAGNOSIS — I82402 Acute embolism and thrombosis of unspecified deep veins of left lower extremity: Secondary | ICD-10-CM | POA: Diagnosis present

## 2024-05-03 DIAGNOSIS — E119 Type 2 diabetes mellitus without complications: Secondary | ICD-10-CM | POA: Diagnosis present

## 2024-05-03 DIAGNOSIS — F03918 Unspecified dementia, unspecified severity, with other behavioral disturbance: Secondary | ICD-10-CM | POA: Diagnosis present

## 2024-05-03 DIAGNOSIS — Z888 Allergy status to other drugs, medicaments and biological substances status: Secondary | ICD-10-CM | POA: Diagnosis not present

## 2024-05-03 DIAGNOSIS — Z7982 Long term (current) use of aspirin: Secondary | ICD-10-CM | POA: Diagnosis not present

## 2024-05-03 DIAGNOSIS — J9601 Acute respiratory failure with hypoxia: Secondary | ICD-10-CM | POA: Diagnosis present

## 2024-05-03 DIAGNOSIS — I10 Essential (primary) hypertension: Secondary | ICD-10-CM

## 2024-05-03 DIAGNOSIS — E785 Hyperlipidemia, unspecified: Secondary | ICD-10-CM | POA: Diagnosis present

## 2024-05-03 DIAGNOSIS — G4730 Sleep apnea, unspecified: Secondary | ICD-10-CM | POA: Diagnosis not present

## 2024-05-03 DIAGNOSIS — Z7901 Long term (current) use of anticoagulants: Secondary | ICD-10-CM | POA: Diagnosis not present

## 2024-05-03 DIAGNOSIS — F0390 Unspecified dementia without behavioral disturbance: Secondary | ICD-10-CM

## 2024-05-03 DIAGNOSIS — N400 Enlarged prostate without lower urinary tract symptoms: Secondary | ICD-10-CM | POA: Diagnosis present

## 2024-05-03 DIAGNOSIS — E66812 Obesity, class 2: Secondary | ICD-10-CM

## 2024-05-03 DIAGNOSIS — F0394 Unspecified dementia, unspecified severity, with anxiety: Secondary | ICD-10-CM | POA: Diagnosis present

## 2024-05-03 DIAGNOSIS — I110 Hypertensive heart disease with heart failure: Secondary | ICD-10-CM | POA: Diagnosis present

## 2024-05-03 DIAGNOSIS — Z86718 Personal history of other venous thrombosis and embolism: Secondary | ICD-10-CM | POA: Diagnosis not present

## 2024-05-03 DIAGNOSIS — Z79899 Other long term (current) drug therapy: Secondary | ICD-10-CM | POA: Diagnosis not present

## 2024-05-03 DIAGNOSIS — R0609 Other forms of dyspnea: Secondary | ICD-10-CM

## 2024-05-03 DIAGNOSIS — T81718A Complication of other artery following a procedure, not elsewhere classified, initial encounter: Secondary | ICD-10-CM | POA: Diagnosis present

## 2024-05-03 DIAGNOSIS — R5381 Other malaise: Secondary | ICD-10-CM | POA: Diagnosis not present

## 2024-05-03 DIAGNOSIS — K219 Gastro-esophageal reflux disease without esophagitis: Secondary | ICD-10-CM | POA: Diagnosis present

## 2024-05-03 DIAGNOSIS — G4733 Obstructive sleep apnea (adult) (pediatric): Secondary | ICD-10-CM | POA: Diagnosis present

## 2024-05-03 DIAGNOSIS — M1712 Unilateral primary osteoarthritis, left knee: Secondary | ICD-10-CM | POA: Diagnosis present

## 2024-05-03 DIAGNOSIS — Z96652 Presence of left artificial knee joint: Secondary | ICD-10-CM | POA: Diagnosis not present

## 2024-05-03 DIAGNOSIS — G4700 Insomnia, unspecified: Secondary | ICD-10-CM | POA: Diagnosis present

## 2024-05-03 DIAGNOSIS — I2699 Other pulmonary embolism without acute cor pulmonale: Secondary | ICD-10-CM

## 2024-05-03 DIAGNOSIS — J449 Chronic obstructive pulmonary disease, unspecified: Secondary | ICD-10-CM | POA: Diagnosis present

## 2024-05-03 DIAGNOSIS — Z6836 Body mass index (BMI) 36.0-36.9, adult: Secondary | ICD-10-CM | POA: Diagnosis not present

## 2024-05-03 DIAGNOSIS — I82452 Acute embolism and thrombosis of left peroneal vein: Secondary | ICD-10-CM | POA: Diagnosis present

## 2024-05-03 DIAGNOSIS — Z603 Acculturation difficulty: Secondary | ICD-10-CM | POA: Diagnosis present

## 2024-05-03 DIAGNOSIS — Z833 Family history of diabetes mellitus: Secondary | ICD-10-CM | POA: Diagnosis not present

## 2024-05-03 LAB — CBC
HCT: 42.9 % (ref 39.0–52.0)
Hemoglobin: 13.9 g/dL (ref 13.0–17.0)
MCH: 29.3 pg (ref 26.0–34.0)
MCHC: 32.4 g/dL (ref 30.0–36.0)
MCV: 90.3 fL (ref 80.0–100.0)
Platelets: 158 K/uL (ref 150–400)
RBC: 4.75 MIL/uL (ref 4.22–5.81)
RDW: 13.3 % (ref 11.5–15.5)
WBC: 7.6 K/uL (ref 4.0–10.5)
nRBC: 0 % (ref 0.0–0.2)

## 2024-05-03 LAB — COMPREHENSIVE METABOLIC PANEL WITH GFR
ALT: 21 U/L (ref 0–44)
AST: 34 U/L (ref 15–41)
Albumin: 3.7 g/dL (ref 3.5–5.0)
Alkaline Phosphatase: 73 U/L (ref 38–126)
Anion gap: 10 (ref 5–15)
BUN: 22 mg/dL (ref 8–23)
CO2: 29 mmol/L (ref 22–32)
Calcium: 8.9 mg/dL (ref 8.9–10.3)
Chloride: 99 mmol/L (ref 98–111)
Creatinine, Ser: 0.93 mg/dL (ref 0.61–1.24)
GFR, Estimated: >60 mL/min (ref >60)
Glucose, Bld: 127 mg/dL — ABNORMAL HIGH (ref 70–99)
Potassium: 4.6 mmol/L (ref 3.5–5.1)
Sodium: 138 mmol/L (ref 135–145)
Total Bilirubin: 0.9 mg/dL (ref 0.0–1.2)
Total Protein: 6.2 g/dL — ABNORMAL LOW (ref 6.5–8.1)

## 2024-05-03 LAB — PHOSPHORUS: Phosphorus: 2.9 mg/dL (ref 2.5–4.6)

## 2024-05-03 LAB — ECHOCARDIOGRAM COMPLETE
Area-P 1/2: 6.27 cm2
Height: 72 in
S' Lateral: 3.9 cm
Weight: 4256.01 [oz_av]

## 2024-05-03 LAB — TROPONIN T, HIGH SENSITIVITY
Troponin T High Sensitivity: <15 ng/L (ref 0–19)
Troponin T High Sensitivity: <15 ng/L (ref 0–19)

## 2024-05-03 LAB — MAGNESIUM: Magnesium: 2.2 mg/dL (ref 1.7–2.4)

## 2024-05-03 MED ORDER — CHLORHEXIDINE GLUCONATE CLOTH 2 % EX PADS
6.0000 | MEDICATED_PAD | Freq: Every day | CUTANEOUS | Status: DC
  Administered 2024-05-05 – 2024-05-13 (×10): 6 via TOPICAL

## 2024-05-03 MED ORDER — APIXABAN 5 MG PO TABS
10.0000 mg | ORAL_TABLET | Freq: Two times a day (BID) | ORAL | Status: AC
  Administered 2024-05-03 – 2024-05-08 (×11): 10 mg via ORAL
  Filled 2024-05-03 (×11): qty 2

## 2024-05-03 MED ORDER — INSULIN ASPART 100 UNIT/ML IJ SOLN
0.0000 [IU] | Freq: Three times a day (TID) | INTRAMUSCULAR | Status: DC
  Administered 2024-05-04 – 2024-05-05 (×4): 2 [IU] via SUBCUTANEOUS

## 2024-05-03 MED ORDER — APIXABAN 5 MG PO TABS
5.0000 mg | ORAL_TABLET | Freq: Two times a day (BID) | ORAL | Status: DC
  Administered 2024-05-09 – 2024-05-13 (×9): 5 mg via ORAL
  Filled 2024-05-03 (×9): qty 1

## 2024-05-03 MED ORDER — BOOST / RESOURCE BREEZE PO LIQD CUSTOM
1.0000 | Freq: Three times a day (TID) | ORAL | Status: DC
  Administered 2024-05-03 – 2024-05-10 (×10): 1 via ORAL
  Administered 2024-05-13: 237 mL via ORAL

## 2024-05-03 MED ORDER — IOHEXOL 350 MG/ML SOLN
80.0000 mL | Freq: Once | INTRAVENOUS | Status: AC | PRN
  Administered 2024-05-03: 80 mL via INTRAVENOUS

## 2024-05-03 MED ORDER — SODIUM CHLORIDE (PF) 0.9 % IJ SOLN
INTRAMUSCULAR | Status: AC
  Filled 2024-05-03: qty 50

## 2024-05-03 NOTE — Progress Notes (Signed)
  Echocardiogram 2D Echocardiogram has been performed.  Tinnie FORBES Gosling RDCS 05/03/2024, 1:43 PM

## 2024-05-03 NOTE — Progress Notes (Addendum)
     Patient Name: Brandon Clark           DOB: 20-May-1941  MRN: [id number]      Admission Date: 04/30/2024  Attending Provider: Sheril Coy, MD  Primary Diagnosis: Primary localized osteoarthritis of left knee   Level of care: Stepdown   OVERNIGHT EVENT   HPI/ Events of Note  Brandon Clark, 83 y.o. male, was admitted on 04/30/2024 for Primary localized osteoarthritis of left knee.   10/28- TKA surgery done by Ortho 10/30- Acute deep vein thrombosis involving the left peroneal veins.  Treating with Eliquis. 10/31- Hospitalist consulted due to shortness of breath and dizziness. CTA chest completed today revealed acute PE with right heart strain.  Heparin  drip was ordered.  I was prompted by pharmacy to seek clarification on pt's Porcine (pork) Protein-containing Drug Products  allergy/ contraindication. I used the acupuncturist to communicate with the patient. I informed the patient that heparin  contains pork-derived products.  Due to his religion, the patient is refusing this medication, and has asked that we provide alternative treatment.   He is currently hemodynamically stable on 2 L [place].  Endorses mild SOB and leg pain, 5/10. Denies any chest discomfort, palpitations, chest pain. No bleeding reported by patient. Breath sounds clear and even.  No increased work of breathing.   S1 and S2.  Regular rate and rhythm.   Plan: Remains stable on 2 L [place], will continue with Eliquis for now.   Monitor closely   Lavanda Horns, DNP, ACNPC- AG [facility] [place]

## 2024-05-03 NOTE — Progress Notes (Signed)
 Subjective: 3 Days Post-Op Procedure(s) (LRB): ARTHROPLASTY, KNEE, TOTAL (Left)  Patient is having a little bit less calf pain today but is still having dizziness and is a little short of breath. He states that the shortness of breath when he winces in pain. He has not gotten up and out of bed since surgery on Tuesday. His granddaughter was translating today.   Activity level:  wbat Diet tolerance:  minimal Voiding:  ok Patient reports pain as moderate.    Objective: Vital signs in last 24 hours: Temp:  [97.1 F (36.2 C)-98.7 F (37.1 C)] 98.7 F (37.1 C) (10/31 0459) Pulse Rate:  [84-104] 102 (10/31 0459) Resp:  [17-19] 19 (10/31 0459) BP: (127-141)/(84-90) 141/90 (10/31 0459) SpO2:  [91 %-94 %] 91 % (10/31 0459)  Labs: Recent Labs    05/01/24 1911  HGB 14.5   Recent Labs    05/01/24 1911  WBC 10.0  RBC 5.12  HCT 46.2  PLT 174   Recent Labs    05/01/24 1911  NA 138  K 4.2  CL 101  CO2 28  BUN 21  CREATININE 1.03  GLUCOSE 129*  CALCIUM 8.8*   No results for input(s): LABPT, INR in the last 72 hours.  Physical Exam:  Neurologically intact ABD soft Neurovascular intact Sensation intact distally Intact pulses distally Dorsiflexion/Plantar flexion intact Incision: dressing C/D/I and no drainage No cellulitis present Compartment soft  Assessment/Plan:  3 Days Post-Op Procedure(s) (LRB): ARTHROPLASTY, KNEE, TOTAL (Left) Advance diet Up with therapy We will get a medicine consult due to patients shortness of breath and dizziness. He may have a PE based on his positive doppler for a calf DVT yesterday but he has been started on eliquis so I am not sure management would change. I will leave further testing up to the medical team based on their consult.   Prentice [place] Lagace 05/03/2024, 8:32 AM

## 2024-05-03 NOTE — Progress Notes (Addendum)
 PHARMACY - ANTICOAGULATION CONSULT NOTE  Pharmacy Consult for Eliquis >> heparin   Indication: pulmonary embolus and DVT  Allergies  Allergen Reactions   Ondansetron  Itching, Swelling and Other (See Comments)    Swelling of the eyes, but this is questioned in 2025.   Porcine (Pork) Protein-Containing Drug Products Other (See Comments)    Patient Measurements: Height: 6' (182.9 cm) Weight: 120.7 kg (266 lb) IBW/kg (Calculated) : 77.6 HEPARIN  DW (KG): 104.1  Vital Signs: Temp: 98.2 F (36.8 C) (10/31 1359) Temp Source: Oral (10/31 1359) BP: 130/86 (10/31 1359) Pulse Rate: 109 (10/31 1359)  Labs: Recent Labs    05/01/24 1911 05/03/24 1027  HGB 14.5 13.9  HCT 46.2 42.9  PLT 174 158  CREATININE 1.03 0.93    Estimated Creatinine Clearance: 83.5 mL/min (by C-G formula based on SCr of 0.93 mg/dL).   Medical History: Past Medical History:  Diagnosis Date   Acid reflux    Arthritis    BPH (benign prostatic hyperplasia)    BPPV (benign paroxysmal positional vertigo)    Dementia (HCC)    Hiatal hernia    Hyperlipidemia    Hypertension    Pre-diabetes    Sleep apnea    sometimes does not use d/t poor fitting of the mask   Ulcer     Medications:  Medications Prior to Admission  Medication Sig Dispense Refill Last Dose/Taking   acetaminophen  (TYLENOL ) 500 MG tablet Take 500-1,000 mg by mouth every 6 (six) hours as needed.      albuterol  (VENTOLIN  HFA) 108 (90 Base) MCG/ACT inhaler Inhale 1-2 puffs into the lungs every 4 (four) hours as needed for wheezing or shortness of breath. 1 each 0    amitriptyline (ELAVIL) 25 MG tablet Take 25 mg by mouth at bedtime.      cetirizine  (ZYRTEC ) 5 MG tablet Take 1 tablet (5 mg total) by mouth daily. 30 tablet 0    diclofenac  (VOLTAREN ) 75 MG EC tablet Take 75 mg by mouth 2 (two) times daily.      donepezil  (ARICEPT ) 10 MG tablet Take 1 tablet (10 mg total) by mouth at bedtime. 30 tablet 5    finasteride  (PROSCAR ) 5 MG tablet Take 1  tablet (5 mg total) by mouth daily. 30 tablet 3    hydrochlorothiazide  (HYDRODIURIL ) 12.5 MG tablet Take 1 tablet (12.5 mg total) by mouth daily. 30 tablet 5    hydrOXYzine (ATARAX) 25 MG tablet Take 25 mg by mouth 3 (three) times daily as needed.      meclizine  (ANTIVERT ) 12.5 MG tablet Take 1 tablet (12.5 mg total) by mouth 3 (three) times daily as needed for dizziness. 30 tablet 0    memantine (NAMENDA) 10 MG tablet Take 10 mg by mouth 2 (two) times daily.      simvastatin  (ZOCOR ) 10 MG tablet Take 1 tablet (10 mg total) by mouth at bedtime. 30 tablet 5    Spacer/Aero-Holding Chambers (AEROCHAMBER PLUS) inhaler Use with inhaler 1 each 2    [DISCONTINUED] aspirin  EC 325 MG tablet Take 1 tablet (325 mg total) by mouth daily. (Patient not taking: Reported on 04/30/2024) 30 tablet 0 Not Taking   [DISCONTINUED] methocarbamol  (ROBAXIN ) 500 MG tablet Take 500 mg by mouth every 8 (eight) hours as needed for muscle spasms. (Patient not taking: Reported on 04/23/2024)      [DISCONTINUED] naproxen  (NAPROSYN ) 500 MG tablet Take 1 tablet (500 mg total) by mouth 2 (two) times daily. 30 tablet 0    [DISCONTINUED] predniSONE  (DELTASONE )  10 MG tablet Take 1 tablet (10 mg total) by mouth in the morning, at noon, and at bedtime. 15 tablet 0    Scheduled:   amitriptyline  25 mg Oral QHS   docusate sodium  100 mg Oral BID   donepezil   10 mg Oral QHS   feeding supplement  1 Container Oral TID BM   feeding supplement  237 mL Oral BID BM   finasteride   5 mg Oral Daily   hydrochlorothiazide   12.5 mg Oral Daily   [START ON 05/04/2024] insulin aspart  0-15 Units Subcutaneous TID WC   memantine  10 mg Oral BID   simvastatin   10 mg Oral QHS    Assessment: 83 YO male presented 10/28 for left knee arthroplasty. Post-op, patient found to have a left DVT and was started on Eliquis VTE dosing on 10/30. Last dose of Eliquis 10mg  given 10/31 ~noon. CTA PE done 10/31 now positive for PE with RHS. Pharmacy consulted for  heparin  dosing in setting of PE with RHS.   Today, 05/03/24: Hgb 13.9--stable Plts 158--stable Scr 0.93--stable Eliquis 10mg  last given this morning at 1155  Goal of Therapy:  Heparin  level 0.3-0.7 units/ml aPTT 66-102 seconds Monitor platelets by anticoagulation protocol: Yes   Plan:  Stop Eliquis Start heparin  infusion @1600  units/hr this evening at midnight (NO bolus given recent Eliquis administration) Check heparin  level and aPTT 8hrs after heparin  infusion starts Can monitor with heparin  level only once both heparin  level and aPTT are correlating. Heparin  level will likely be falsely elevated initially given recent Eliquis use.  Monitor heparin  level, aPTT, CBC daily  Continue to monitor for s/sx of bleeding daily   Lacinda Moats, PharmD Clinical Pharmacist  10/31/20257:24 PM   ADDENDUM: As I went to order heparin , an alert popped up that pork products are contraindicated for the patient. Overnight NP investigated further and found patient would like to avoid all pork products for religious purposes. Patient remains stable with no s/sx of bleeding, on room air. After discussion with NP, will continue with Eliquis for now and continue to monitor the patient's clinical progression.

## 2024-05-03 NOTE — Consult Note (Addendum)
 Initial Consultation Note   Patient: Brandon Clark FMW:[id number] DOB: 01-13-1941 PCP: Gladystine Erminio CROME, MD DOA: 04/30/2024 DOS: the patient was seen and examined on 05/03/2024 Primary service: Sheril Coy, MD  Referring physician:  Reason for consult:  Assessment and Plan: Principal Problem:   Primary localized osteoarthritis of left knee   Total knee replacement status, left   Primary osteoarthritis of left knee Continue postop care per primary team. Hold physical therapy for now.  Active Problems:   Acute deep vein thrombosis (DVT) of left lower extremity (HCC)  Complicated by:   Acute pulmonary embolism (HCC)  The patient was already on apixaban. Will switch to heparin . Will transfer to the stepdown unit.    HTN (hypertension) Currently not on antihypertensives. Low-dose antihypertensives as needed.    Sleep apnea Not on CPAP.    Dementia (HCC) Continue lisinopril 10 mg p.o. daily. Continue memantine 10 mg p.o. twice daily.    Type 2 diabetes mellitus without complication,  without long-term current use of insulin (HCC) Carbohydrate modified diet. CBG monitoring with RI SS. Check hemoglobin A1c.    Class 2 obesity  Current BMI 36.08 kg/m. Would benefit from lifestyle modifications. Follow-up closely with PCP.    TR[facility] will continue to follow the patient.  HPI: Brandon Clark is a 83 y.o. male with past medical history of GERD, hiatal hernia, osteoarthritis, BPH, dementia, hyperlipidemia, hypertension, prediabetes, sleep apnea not on CPAP, history of PUD who underwent a left total knee replacement 3 days ago who we have been consulted to evaluate due to dyspnea, dizziness, new oxygen requirement.  Translation services were used.  No personal or family history of thromboembolism.  He denied chest pain, palpitations, dizziness or diaphoresis.  He complains that he is constipated and he is not eating much. He denied fever, chills,  rhinorrhea, sore throat, wheezing or hemoptysis.  No abdominal pain, nausea, emesis, diarrhea, constipation, melena or hematochezia.  No flank pain, dysuria, frequency or hematuria.  No polyuria, polydipsia, polyphagia or blurred vision.   Lab work: CBC was normal.  CMP showed a glucose of 129 mg/dL and a total protein of 6.1 g/dL, but the rest of the CMP measurements were normal.  Imaging: DVT Doppler showed findings consistent with acute DVT involving the left peroneal veins.  Review of Systems: As mentioned in the history of present illness. All other systems reviewed and are negative. Past Medical History:  Diagnosis Date   Acid reflux    Arthritis    BPH (benign prostatic hyperplasia)    BPPV (benign paroxysmal positional vertigo)    Dementia (HCC)    Hiatal hernia    Hyperlipidemia    Hypertension    Pre-diabetes    Sleep apnea    sometimes does not use d/t poor fitting of the mask   Ulcer    Past Surgical History:  Procedure Laterality Date   ESOPHAGEAL MANOMETRY N/A 04/27/2016   Procedure: ESOPHAGEAL MANOMETRY (EM) with PH;  Surgeon: Rogelia Copping, MD;  Location: [facility] ENDOSCOPY;  Service: Endoscopy;  Laterality: N/A;   ESOPHAGEAL MANOMETRY N/A 04/19/2017   Procedure: ESOPHAGEAL MANOMETRY (EM);  Surgeon: Dianna Specking, MD;  Location: [facility] ENDOSCOPY;  Service: Endoscopy;  Laterality: N/A;   FACIAL LACERATIONS REPAIR     INSERTION OF MESH N/A 06/02/2017   Procedure: INSERTION OF MESH;  Surgeon: Rubin Calamity, MD;  Location: [facility] ORS;  Service: General;  Laterality: N/A;   TOTAL KNEE ARTHROPLASTY Left 04/30/2024   Procedure: ARTHROPLASTY, KNEE, TOTAL;  Surgeon: Sheril,  Maude, MD;  Location: [facility] ORS;  Service: Orthopedics;  Laterality: Left;   Social History:  reports that he has never smoked. He has never used smokeless tobacco. He reports that he does not drink alcohol and does not use drugs.  Allergies  Allergen Reactions   Ondansetron  Itching, Swelling and Other (See  Comments)    Swelling of the eyes, but this is questioned in 2025.   Porcine (Pork) Protein-Containing Drug Products Other (See Comments)    Family History  Problem Relation Age of Onset   Diabetes Mother    Diabetes Brother    Heart attack Neg Hx    Hyperlipidemia Neg Hx    Hypertension Neg Hx    Sudden death Neg Hx     Prior to Admission medications   Medication Sig Start Date End Date Taking? Authorizing Provider  aspirin  EC 81 MG tablet Take 1 tablet (81 mg total) by mouth 2 (two) times daily after a meal. For 2 weeks then once a day for 2 weeks for DVT prevention. 05/01/24 05/01/25 Yes Lenis Barter, PA-C  HYDROcodone -acetaminophen  (NORCO/VICODIN) 5-325 MG tablet Take 1-2 tablets by mouth every 6 (six) hours as needed for moderate pain (pain score 4-6) or severe pain (pain score 7-10) (post op pain. Max 6 pills per day). 04/30/24 04/30/25 Yes Lenis Barter, PA-C  acetaminophen  (TYLENOL ) 500 MG tablet Take 500-1,000 mg by mouth every 6 (six) hours as needed.    [provider]  albuterol  (VENTOLIN  HFA) 108 (90 Base) MCG/ACT inhaler Inhale 1-2 puffs into the lungs every 4 (four) hours as needed for wheezing or shortness of breath. 06/09/21   Mortenson, Ashley, MD  amitriptyline (ELAVIL) 25 MG tablet Take 25 mg by mouth at bedtime.    [provider]  aspirin  EC 325 MG tablet Take 1 tablet (325 mg total) by mouth daily. Patient not taking: Reported on 04/30/2024 06/09/17   Dansie, William, PA-C  azithromycin  (ZITHROMAX ) 250 MG tablet Take 1 tablet (250 mg total) by mouth daily. Take first 2 tablets together, then 1 every day until finished. 01/18/22   White, Shelba SAUNDERS, NP  benzonatate  (TESSALON ) 200 MG capsule Take 1 capsule (200 mg total) by mouth 3 (three) times daily as needed for cough. 01/10/22   Raspet, Erin K, PA-C  cetirizine  (ZYRTEC ) 5 MG tablet Take 1 tablet (5 mg total) by mouth daily. 06/01/22 04/30/24  Enedelia Dorna HERO, FNP  diclofenac  (VOLTAREN ) 75 MG EC  tablet Take 75 mg by mouth 2 (two) times daily.    [provider]  dicyclomine  (BENTYL ) 20 MG tablet Take 1 tablet (20 mg total) by mouth 2 (two) times daily. 01/18/22   White, Shelba SAUNDERS, NP  donepezil  (ARICEPT ) 10 MG tablet Take 1 tablet (10 mg total) by mouth at bedtime. 06/12/18   Newlin, Enobong, MD  finasteride  (PROSCAR ) 5 MG tablet Take 1 tablet (5 mg total) by mouth daily. 10/16/19   Newlin, Enobong, MD  fluticasone  (FLONASE ) 50 MCG/ACT nasal spray Place 2 sprays into both nostrils daily. 01/10/22   Raspet, Erin K, PA-C  hydrochlorothiazide  (HYDRODIURIL ) 12.5 MG tablet Take 1 tablet (12.5 mg total) by mouth daily. 06/12/18   Newlin, Enobong, MD  hydrOXYzine (ATARAX) 25 MG tablet Take 25 mg by mouth 3 (three) times daily as needed.    [provider]  lidocaine  (LIDODERM ) 5 % Place 1 patch onto the skin daily. Remove & Discard patch within 12 hours or as directed by MD 12/19/23   Barrett, Warren SAILOR,  PA-C  magnesium  oxide (MAG-OX) 400 (240 Mg) MG tablet Take 400 mg by mouth daily.    [provider]  meclizine  (ANTIVERT ) 12.5 MG tablet Take 1 tablet (12.5 mg total) by mouth 3 (three) times daily as needed for dizziness. 03/18/22   Lynwood Lenis, PA-C  memantine (NAMENDA) 10 MG tablet Take 10 mg by mouth 2 (two) times daily.    [provider]  methocarbamol  (ROBAXIN ) 500 MG tablet Take 1 tablet (500 mg total) by mouth every 6 (six) hours as needed for muscle spasms. 04/30/24   Lenis Barter, PA-C  naproxen  (NAPROSYN ) 500 MG tablet Take 1 tablet (500 mg total) by mouth 2 (two) times daily. 12/19/23   Barrett, Warren SAILOR, PA-C  predniSONE  (DELTASONE ) 10 MG tablet Take 1 tablet (10 mg total) by mouth in the morning, at noon, and at bedtime. 03/18/22   Lynwood Lenis, PA-C  pyridOXINE (VITAMIN B6) 25 MG tablet Take 25 mg by mouth daily.    [provider]  simvastatin  (ZOCOR ) 10 MG tablet Take 1 tablet (10 mg total) by mouth at bedtime. 10/16/19   Newlin, Enobong, MD   Spacer/Aero-Holding Chambers (AEROCHAMBER PLUS) inhaler Use with inhaler 06/09/21   Van Knee, MD  tamsulosin  (FLOMAX ) 0.4 MG CAPS capsule Take 1 capsule (0.4 mg total) by mouth daily. 01/18/22   White, Shelba SAUNDERS, NP  traZODone  (DESYREL ) 50 MG tablet Take 1 tablet (50 mg total) by mouth at bedtime as needed for sleep. 10/16/19   Delbert Clam, MD    Physical Exam: Vitals:   05/02/24 0520 05/02/24 1218 05/02/24 2200 05/03/24 0459  BP: 123/81 134/85 127/84 (!) 141/90  Pulse: 92 (!) 104 84 (!) 102  Resp: 17 17 17 19   Temp: 98.6 F (37 C) 97.6 F (36.4 C) (!) 97.1 F (36.2 C) 98.7 F (37.1 C)  TempSrc:  Oral Oral Oral  SpO2: 93% 92% 94% 91%  Weight:      Height:        Physical Exam Vitals reviewed.  Constitutional:      Appearance: He is obese. He is ill-appearing.  HENT:     Head: Normocephalic.     Nose: No rhinorrhea.     Mouth/Throat:     Mouth: Mucous membranes are moist.  Eyes:     General: No scleral icterus.    Pupils: Pupils are equal, round, and reactive to light.  Abdominal:     General: Bowel sounds are normal. There is no distension.     Palpations: Abdomen is soft.     Tenderness: There is no abdominal tenderness. There is no right CVA tenderness or left CVA tenderness.  Musculoskeletal:     Right lower leg: No edema.     Left lower leg: No edema.  Skin:    General: Skin is warm and dry.  Neurological:     General: No focal deficit present.     Mental Status: He is alert and oriented to person, place, and time.  Psychiatric:        Mood and Affect: Mood normal.        Behavior: Behavior normal.      Data Reviewed:   Results are pending, will review when available.   Family Communication:  Primary team communication:  Thank you very much for involving us  in the care of your patient.  Author: Lenis Dorn Castor, MD 05/03/2024 9:00 AM  For on call review [email or web address].

## 2024-05-03 NOTE — Discharge Instructions (Addendum)
 Information on my medicine - ELIQUIS (apixaban)  This medication education was reviewed with me or my healthcare representative as part of my discharge preparation.  The pharmacist that spoke with me during my hospital stay was:  Ronal CHRISTELLA Rav, [facility]  Why was Eliquis prescribed for you? Eliquis was prescribed to treat blood clots that may have been found in the veins of your legs (deep vein thrombosis) or in your lungs (pulmonary embolism) and to reduce the risk of them occurring again.  What do You need to know about Eliquis ? The starting dose is 10 mg (two 5 mg tablets) taken TWICE daily for the FIRST SEVEN (7) DAYS, then on 05/09/24  the dose is reduced to ONE 5 mg tablet taken TWICE daily.  Eliquis may be taken with or without food.   Try to take the dose about the same time in the morning and in the evening. If you have difficulty swallowing the tablet whole please discuss with your pharmacist how to take the medication safely.  Take Eliquis exactly as prescribed and DO NOT stop taking Eliquis without talking to the doctor who prescribed the medication.  Stopping may increase your risk of developing a new blood clot.  Refill your prescription before you run out.  After discharge, you should have regular check-up appointments with your healthcare provider that is prescribing your Eliquis.    What do you do if you miss a dose? If a dose of ELIQUIS is not taken at the scheduled time, take it as soon as possible on the same day and twice-daily administration should be resumed. The dose should not be doubled to make up for a missed dose.  Important Safety Information A possible side effect of Eliquis is bleeding. You should call your healthcare provider right away if you experience any of the following: Bleeding from an injury or your nose that does not stop. Unusual colored urine (red or dark brown) or unusual colored stools (red or black). Unusual bruising for unknown reasons. A  serious fall or if you hit your head (even if there is no bleeding).  Some medicines may interact with Eliquis and might increase your risk of bleeding or clotting while on Eliquis. To help avoid this, consult your healthcare provider or pharmacist prior to using any new prescription or non-prescription medications, including herbals, vitamins, non-steroidal anti-inflammatory drugs (NSAIDs) and supplements.  This website has more information on Eliquis (apixaban): http://www.eliquis.com/eliquis/home

## 2024-05-03 NOTE — Progress Notes (Signed)
 PT Cancellation Note  Patient Details Name: Brandon Clark MRN: [id number] DOB: [birth year]   Cancelled Treatment:    Reason Eval/Treat Not Completed: Medical issues which prohibited therapy  Pt with +DVT and now with order for Chest CT to assess for PE (on O2 with sats 91%), pt with medical consult.  Spoke with Dr. Celinda after he saw pt and recommended holding therapy today due to DVT and possible PE.  He recommended checking with medical team tomorrow before proceeding with PT.   Brandon Clark, PT Acute Rehab [facility] [phone]  Brandon Clark Brandon Clark [place] 05/03/2024, 9:27 AM

## 2024-05-04 DIAGNOSIS — J9601 Acute respiratory failure with hypoxia: Secondary | ICD-10-CM

## 2024-05-04 DIAGNOSIS — E785 Hyperlipidemia, unspecified: Secondary | ICD-10-CM

## 2024-05-04 LAB — GLUCOSE, CAPILLARY
Glucose-Capillary: 106 mg/dL — ABNORMAL HIGH (ref 70–99)
Glucose-Capillary: 137 mg/dL — ABNORMAL HIGH (ref 70–99)
Glucose-Capillary: 138 mg/dL — ABNORMAL HIGH (ref 70–99)
Glucose-Capillary: 146 mg/dL — ABNORMAL HIGH (ref 70–99)
Glucose-Capillary: 174 mg/dL — ABNORMAL HIGH (ref 70–99)

## 2024-05-04 LAB — MRSA NEXT GEN BY PCR, NASAL: MRSA by PCR Next Gen: NOT DETECTED

## 2024-05-04 LAB — HEMOGLOBIN A1C
Hgb A1c MFr Bld: 5.9 % — ABNORMAL HIGH (ref 4.8–5.6)
Mean Plasma Glucose: 122.63 mg/dL

## 2024-05-04 NOTE — Progress Notes (Signed)
.  Subjective: 4 Days Post-Op Procedure(s) (LRB): ARTHROPLASTY, KNEE, TOTAL (Left)  Patient seen and examined. CTA showed PE and right heart strain. Remains on Eliquis  Activity level:  WBAT Diet tolerance:  minimal Voiding:  ok Patient reports pain as severe.    Objective: Vital signs in last 24 hours: Temp:  [97.5 F (36.4 C)-98.5 F (36.9 C)] 98.5 F (36.9 C) (11/01 0800) Pulse Rate:  [84-109] 101 (11/01 0900) Resp:  [18-27] 22 (11/01 0900) BP: (102-148)/(72-91) 123/78 (11/01 0900) SpO2:  [91 %-99 %] 95 % (11/01 0900) Weight:  [120.7 kg-122 kg] 122 kg (10/31 2327)  Labs: Recent Labs    05/01/24 1911 05/03/24 1027  HGB 14.5 13.9   Recent Labs    05/01/24 1911 05/03/24 1027  WBC 10.0 7.6  RBC 5.12 4.75  HCT 46.2 42.9  PLT 174 158   Recent Labs    05/01/24 1911 05/03/24 1027  NA 138 138  K 4.2 4.6  CL 101 99  CO2 28 29  BUN 21 22  CREATININE 1.03 0.93  GLUCOSE 129* 127*  CALCIUM 8.8* 8.9   No results for input(s): LABPT, INR in the last 72 hours.  Physical Exam:  Neurologically intact ABD soft Neurovascular intact Sensation intact distally Intact pulses distally Dorsiflexion/Plantar flexion intact Incision: dressing C/D/I with scan drainage on aquacell No cellulitis present Discomfort with calf squeeze Compartment soft  Assessment/Plan:  4 Days Post-Op Procedure(s) (LRB): ARTHROPLASTY, KNEE, TOTAL (Left)  WBAT Up with PT Continue Eliquis for DVT/PE. Appreciate medicine recs Pain control as needed    Adine JAYSON Mon 05/04/2024, 11:54 AM

## 2024-05-04 NOTE — Progress Notes (Signed)
 PROGRESS NOTE    Brandon Clark  FMW:[id number] DOB: 07/18/1940 DOA: 04/30/2024 PCP: Gladystine Erminio CROME, MD   Brief Narrative:  83 year old male with history of GERD/hiatal hernia/PUD, osteoarthritis, BPH, dementia, hyperlipidemia, hypertension, prediabetes, sleep apnea not on CPAP underwent left total knee replacement on 04/30/2024 by orthopedics.  TRH consulted on 05/03/2024 for dyspnea, dizziness and new oxygen requirement.  She was found to have left acute peroneal vein DVT on 05/02/2024 and was already started on Eliquis.  Because of dyspnea and oxygen requirement, he was supposed to be switched to heparin  drip but because of patient's faith/religion, she would not want to take pork to right products including heparin .  Eliquis was continued.  CTA chest showed acute PE with right heart strain.  Assessment & Plan:   Acute PE with right heart strain Acute DVT of left peroneal veins Acute respiratory failure with hypoxia - Patient remains intermittently tachypneic requiring supplemental oxygen via nasal cannula at 2 L/min.  Wean off as able.  Imaging as above.  Will continue Eliquis as discussed above. 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  Continue hydrochlorothiazide  - Continue statin  Sleep apnea - Continue CPAP  Probable dementia -Will continue memantine and donepezil   Diabetes mellitus type 2 -A1c 5.9.  Continue CBGs with SSI  BPH -continue finasteride   Obesity class II - Outpatient follow-up   Subjective: Patient seen and examined at bedside.  Still slightly short of breath with exertion.  No fever, vomiting, agitation reported.  Objective: Vitals:   05/04/24 0600 05/04/24 0700 05/04/24 0800 05/04/24 0900  BP: (!) 143/84  102/84 113/83 123/78  Pulse: 96 (!) 107 95 (!) 101  Resp: (!) 22 (!) 23 (!) 22 (!) 22  Temp:   98.5 F (36.9 C)   TempSrc:   Oral   SpO2: 96% 95% 94% 95%  Weight:      Height:        Intake/Output Summary (Last 24 hours) at 05/04/2024 1028 Last data filed at 05/04/2024 0430 Gross per 24 hour  Intake --  Output 600 ml  Net -600 ml   Filed Weights   04/30/24 1056 05/03/24 1900 05/03/24 2327  Weight: 120.7 kg 120.7 kg 122 kg    Examination:  General exam: Appears calm and comfortable.  On 2 L oxygen via nasal cannula. Respiratory system: Bilateral decreased breath sounds at bases with scattered crackles and tachypnea Cardiovascular system: S1 & S2 heard, Rate controlled Gastrointestinal system: Abdomen is nondistended, soft and nontender. Normal bowel sounds heard. Extremities: No cyanosis, clubbing; trace lower extremity edema Central nervous system: Awake, slow to respond.  No focal neurological deficits. Moving extremities Skin: No rashes, lesions or ulcers Psychiatry: Affect is mostly flat.  Not agitated.    Data Reviewed: I have personally reviewed following labs and imaging studies  CBC: Recent Labs  Lab 05/01/24 1911 05/03/24 1027  WBC 10.0 7.6  HGB 14.5 13.9  HCT 46.2 42.9  MCV 90.2 90.3  PLT 174 158   Basic Metabolic Panel: Recent Labs  Lab 05/01/24 1911 05/03/24 1027  NA 138 138  K 4.2 4.6  CL 101 99  CO2 28 29  GLUCOSE 129* 127*  BUN 21 22  CREATININE 1.03 0.93  CALCIUM 8.8* 8.9  MG  --  2.2  PHOS  --  2.9   GFR: Estimated Creatinine Clearance: 84.1 mL/min (by C-G formula based on SCr of 0.93 mg/dL). Liver Function Tests: Recent Labs  Lab 05/01/24 1911 05/03/24 1027  AST 38 34  ALT 23 21  ALKPHOS 61 73  BILITOT 0.7 0.9  PROT 6.1* 6.2*  ALBUMIN 3.8 3.7   No results for input(s): LIPASE, AMYLASE in the last 168 hours. No results for input(s): AMMONIA in the last 168 hours. Coagulation Profile: No results for input(s): INR,  PROTIME in the last 168 hours. Cardiac Enzymes: No results for input(s): CKTOTAL, CKMB, CKMBINDEX, TROPONINI in the last 168 hours. BNP (last 3 results) No results for input(s): PROBNP in the last 8760 hours. HbA1C: Recent Labs    05/04/24 0240  HGBA1C 5.9*   CBG: Recent Labs  Lab 05/04/24 0026 05/04/24 0727  GLUCAP 146* 138*   Lipid Profile: No results for input(s): CHOL, HDL, LDLCALC, TRIG, CHOLHDL, LDLDIRECT in the last 72 hours. Thyroid  Function Tests: No results for input(s): TSH, T4TOTAL, FREET4, T3FREE, THYROIDAB in the last 72 hours. Anemia Panel: No results for input(s): VITAMINB12, FOLATE, FERRITIN, TIBC, IRON, RETICCTPCT in the last 72 hours. Sepsis Labs: No results for input(s): PROCALCITON, LATICACIDVEN in the last 168 hours.  Recent Results (from the past 240 hours)  MRSA Next Gen by PCR, Nasal     Status: None   Collection Time: 05/03/24 11:43 PM   Specimen: Nasal Mucosa; Nasal Swab  Result Value Ref Range Status   MRSA by PCR Next Gen NOT DETECTED NOT DETECTED Final    Comment: (NOTE) The GeneXpert MRSA Assay (FDA approved for NASAL specimens only), is one component of a comprehensive MRSA colonization surveillance program. It is not intended to diagnose MRSA infection nor to guide or monitor treatment for MRSA infections. Test performance is not FDA approved in patients less than 75 years old. Performed at [facility], [address] [place], [place] [address]          Radiology Studies: ECHOCARDIOGRAM COMPLETE Result Date: 05/03/2024    ECHOCARDIOGRAM REPORT   Patient Name:   [name] [facility] Date of Exam: 05/03/2024 Medical Rec #:  [id number]                 Height:       72.0 in Accession #:    [id number]                Weight:       266.0 lb Date of Birth:  1940-12-21                 BSA:          2.405 m Patient Age:    83 years                  BP:           152/102  mmHg Patient Gender: M                         HR:           113 bpm. Exam Location:  Inpatient Procedure: 2D Echo, Cardiac Doppler and Color Doppler (Both Spectral and Color            Flow Doppler were utilized during procedure). Indications:    Dyspnea R06.00  History:        Patient has no prior history of Echocardiogram examinations.  Risk Factors:Hypertension and Dyslipidemia.  Sonographer:    Tinnie Gosling RDCS Referring Phys: [phone] DAVID MANUEL ORTIZ IMPRESSIONS  1. Patient tachycardic throughout the exam. Left ventricular ejection fraction, by estimation, is 60 to 65%. The left ventricle has hyperdynamic function. The left ventricle has no regional wall motion abnormalities. There is mild concentric left ventricular hypertrophy. Left ventricular diastolic parameters were normal.  2. Right ventricular systolic function is normal. The right ventricular size is moderately enlarged. Tricuspid regurgitation signal is inadequate for assessing PA pressure.  3. The mitral valve is normal in structure. No evidence of mitral valve regurgitation. No evidence of mitral stenosis.  4. The aortic valve is tricuspid. Aortic valve regurgitation is not visualized. No aortic stenosis is present.  5. Aortic dilatation noted. There is mild dilatation of the ascending aorta, measuring 42 mm.  6. The inferior vena cava is dilated in size with >50% respiratory variability, suggesting right atrial pressure of 8 mmHg. Conclusion(s)/Recommendation(s): Hyperdynamic LV systolic function. Moderate RV enlargement with preserved function. Mild dilation of ascending thoracic aorta measuring 4.2 cm. Recommend repeat thoracic imaging via echo or CT in 1 year to ensure stability in size. FINDINGS  Left Ventricle: Patient tachycardic throughout the exam. Left ventricular ejection fraction, by estimation, is 60 to 65%. The left ventricle has hyperdynamic function. The left ventricle has no regional wall motion abnormalities.  The left ventricular internal cavity size was normal in size. There is mild concentric left ventricular hypertrophy. Left ventricular diastolic parameters were normal. Right Ventricle: The right ventricular size is moderately enlarged. No increase in right ventricular wall thickness. Right ventricular systolic function is normal. Tricuspid regurgitation signal is inadequate for assessing PA pressure. Left Atrium: Left atrial size was normal in size. Right Atrium: Right atrial size was normal in size. Pericardium: There is no evidence of pericardial effusion. Mitral Valve: The mitral valve is normal in structure. No evidence of mitral valve regurgitation. No evidence of mitral valve stenosis. Tricuspid Valve: The tricuspid valve is normal in structure. Tricuspid valve regurgitation is trivial. No evidence of tricuspid stenosis. Aortic Valve: The aortic valve is tricuspid. Aortic valve regurgitation is not visualized. No aortic stenosis is present. Pulmonic Valve: The pulmonic valve was normal in structure. Pulmonic valve regurgitation is not visualized. No evidence of pulmonic stenosis. Aorta: Aortic dilatation noted. There is mild dilatation of the ascending aorta, measuring 42 mm. Venous: The inferior vena cava is dilated in size with greater than 50% respiratory variability, suggesting right atrial pressure of 8 mmHg. IAS/Shunts: The interatrial septum was not well visualized.  LEFT VENTRICLE PLAX 2D LVIDd:         5.00 cm   Diastology LVIDs:         3.90 cm   LV e' lateral:   10.20 cm/s LV PW:         1.10 cm   LV E/e' lateral: 7.1 LV IVS:        1.10 cm LVOT diam:     2.10 cm LV SV:         53 LV SV Index:   22 LVOT Area:     3.46 cm LV IVRT:       74 msec  RIGHT VENTRICLE             IVC RV S prime:     18.30 cm/s  IVC diam: 2.30 cm TAPSE (M-mode): 1.9 cm LEFT ATRIUM           Index LA diam:  3.30 cm 1.37 cm/m LA Vol (A2C): 16.2 ml 6.74 ml/m  AORTIC VALVE LVOT Vmax:   123.00 cm/s LVOT Vmean:  90.900 cm/s  LVOT VTI:    0.152 m  AORTA Ao Root diam: 3.90 cm Ao Asc diam:  4.20 cm MITRAL VALVE MV Area (PHT): 6.27 cm     SHUNTS MV Decel Time: 121 msec     Systemic VTI:  0.15 m MV E velocity: 72.80 cm/s   Systemic Diam: 2.10 cm MV A velocity: 102.00 cm/s MV E/A ratio:  0.71 Georganna Archer Electronically signed by Georganna Archer Signature Date/Time: 05/03/2024/2:07:52 PM    Final    CT Angio Chest Pulmonary Embolism (PE) W or WO Contrast Result Date: 05/03/2024 EXAM: CTA of the Chest with contrast for PE 05/03/2024 01:07:38 PM TECHNIQUE: CTA of the chest was performed without and with the administration of 80 mL of iohexol  (OMNIPAQUE ) 350 MG/ML injection. Multiplanar reformatted images are provided for review. MIP images are provided for review. Automated exposure control, iterative reconstruction, and/or weight based adjustment of the mA/kV was utilized to reduce the radiation dose to as low as reasonably achievable. COMPARISON: None available. CLINICAL HISTORY: Pulmonary embolism (PE) suspected, high prob; Just diagnosed with DVT. FINDINGS: PULMONARY ARTERIES: Evaluation of the pulmonary arteries is limited secondary to respiratory motion artifact. Suspected nonocclusive pulmonary emboli in the segmental branches of the right upper lobe (series 5, image 155). Additional occlusive pulmonary embolism in a segmental branch of a lingular artery (series 5, image 170). Main pulmonary artery is normal in caliber. MEDIASTINUM: The heart and pericardium demonstrate no acute abnormality. There is evidence of right heart strain with RV/LV ratio of 46/35 mm. Ascending aorta measuring 43 mm. Thyroidectomy. LYMPH NODES: No mediastinal, hilar or axillary lymphadenopathy. LUNGS AND PLEURA: Atelectasis in the right greater than left lower lobes. No focal consolidation or pulmonary edema. No pleural effusion or pneumothorax. UPPER ABDOMEN: Limited images of the upper abdomen demonstrate hepatic steatosis. Small hiatal hernia . SOFT  TISSUES AND BONES: No acute bone or soft tissue abnormality. IMPRESSION: 1. Suspected nonocclusive pulmonary emboli in the segmental branches of the right upper lobe and additional occlusive pulmonary embolism in a segmental branch of a lingular artery. 2. Evidence of right heart strain with RV/LV ratio of 1.3. 3. 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 Recommend annual imaging followup by CTA or MRA. This recommendation follows 2010 ACCF/AHA/AATS/ACR/ASA/SCA/SCAI/SIR/STS/SVM Guidelines for the Diagnosis and Management of Patients with Thoracic Aortic Disease. Circulation. 2010; 121: Z733-z630. Aortic aneurysm NOS (ICD10-I71.9) 4. Results will be called by the radiology assistant and documented within [facility]. Electronically signed by: Norman Gatlin MD 05/03/2024 01:58 PM EDT RP Workstation: [id number]   VAS US  LOWER EXTREMITY VENOUS (DVT) Result Date: 05/02/2024  Lower Venous DVT Study Patient Name:  PEREZ DIRICO [facility]  Date of Exam:   05/02/2024 Medical Rec #: [id number]                  Accession #:    [id number] Date of Birth: [birth year]                  Patient Gender: M Patient Age:   47 years Exam Location:  [facility] Procedure:      VAS US  LOWER EXTREMITY VENOUS (DVT) Referring Phys: PRENTICE NIDA --------------------------------------------------------------------------------  Indications: Pain, Edema, and Post-op.  Limitations: Poor ultrasound/tissue interface and body habitus. Comparison Study: Previous study 12.7.2018 Performing Technologist: Edilia Elden Appl  Examination Guidelines: A complete evaluation includes B-mode imaging, spectral Doppler,  color Doppler, and power Doppler as needed of all accessible portions of each vessel. Bilateral testing is considered an integral part of a complete examination. Limited examinations for reoccurring indications may be performed as noted. The reflux portion of the exam is performed with the patient in reverse Trendelenburg.   +-----+---------------+---------+-----------+----------+--------------+ RIGHTCompressibilityPhasicitySpontaneityPropertiesThrombus Aging +-----+---------------+---------+-----------+----------+--------------+ CFV  Full           Yes      Yes                                 +-----+---------------+---------+-----------+----------+--------------+ SFJ  Full           Yes      Yes                                 +-----+---------------+---------+-----------+----------+--------------+   +---------+---------------+---------+-----------+----------+--------------+ LEFT     CompressibilityPhasicitySpontaneityPropertiesThrombus Aging +---------+---------------+---------+-----------+----------+--------------+ CFV      Full           Yes      Yes                                 +---------+---------------+---------+-----------+----------+--------------+ SFJ      Full           Yes      Yes                                 +---------+---------------+---------+-----------+----------+--------------+ FV Prox  Full                                                        +---------+---------------+---------+-----------+----------+--------------+ FV Mid   Full                                                        +---------+---------------+---------+-----------+----------+--------------+ FV DistalFull                                                        +---------+---------------+---------+-----------+----------+--------------+ PFV      Full                                                        +---------+---------------+---------+-----------+----------+--------------+ POP      Full           Yes      Yes                                 +---------+---------------+---------+-----------+----------+--------------+ PTV      Full                                                        +---------+---------------+---------+-----------+----------+--------------+  PERO     None           No       No                                  +---------+---------------+---------+-----------+----------+--------------+ Deep vein thrombosis noted in the peroneal vein at the middle segment.   Summary: RIGHT: - No evidence of common femoral vein obstruction.   LEFT: - Findings consistent with acute deep vein thrombosis involving the left peroneal veins.  - No cystic structure found in the popliteal fossa.  *See table(s) above for measurements and observations. Electronically signed by Debby Robertson on 05/02/2024 at 8:08:34 PM.    Final         Scheduled Meds:  amitriptyline  25 mg Oral QHS   apixaban  10 mg Oral BID   Followed by   NOREEN ON 05/09/2024] apixaban  5 mg Oral BID   Chlorhexidine  Gluconate Cloth  6 each Topical Daily   docusate sodium  100 mg Oral BID   donepezil   10 mg Oral QHS   feeding supplement  1 Container Oral TID BM   feeding supplement  237 mL Oral BID BM   finasteride   5 mg Oral Daily   hydrochlorothiazide   12.5 mg Oral Daily   insulin aspart  0-15 Units Subcutaneous TID WC   memantine  10 mg Oral BID   simvastatin   10 mg Oral QHS   Continuous Infusions:  lactated ringers      lactated ringers      lactated ringers      lactated ringers  Stopped (05/01/24 1226)          Sophie Mao, MD [facility] 05/04/2024, 10:28 AM

## 2024-05-04 NOTE — Progress Notes (Signed)
 Physical Therapy Treatment Patient Details Name: Brandon Clark MRN: [id number] DOB: Oct 08, 1940 Today's Date: 05/04/2024   History of Present Illness 83 year old male with history of GERD/hiatal hernia/PUD, osteoarthritis, BPH, dementia, hyperlipidemia, hypertension, BPPV,prediabetes, sleep apnea not on CPAP underwent left total knee replacement on 04/30/2024 by orthopedics.  TRH consulted on 05/03/2024 for dyspnea, dizziness and new oxygen requirement.  He was found to have left acute peroneal vein DVT on 05/02/2024 and was already started on Eliquis.  Because of dyspnea and oxygen requirement, he was supposed to be switched to heparin  drip but because of patient's faith/religion, he would not want to take pork to right products including heparin .  Eliquis was continued.  05/03/24 CTA chest showed acute PE with right heart strain.   Acute PE with right heart strain  Acute DVT of left peroneal veins  Acute respiratory failure with hypoxia  - Patient remains intermittently tachypneic requiring supplemental oxygen via nasal cannula at 2 L/min.  -2D echo showed EF of 60 to 65% with moderate RV enlargement with preserved function.    PT Comments  04/30/24  LTKR 05/02/24  L Acute Peroneal Vein DVT 05/03/24  Acute PE with right heart strain  POD # 4 pm session Pt seen in ICU RM# [id number]  Pt in bed on 2 lts sats avg 96%.  HR 92 AxO x 3 this session following all commands.  Family present and assisted with translation.  Assisted OOB to amb was difficult/limited and required + 2 assist.  See details below.  Due to post op medical complications, Pt may need ST Rehab at SNF.  Will consult LPT and determine discharge disposition closer to medical stability.       If plan is discharge home, recommend the following: A lot of help with walking and/or transfers;A lot of help with bathing/dressing/bathroom;Assistance with cooking/housework;Help with stairs or ramp for entrance   Can travel by  private vehicle        Equipment Recommendations  None recommended by PT    Recommendations for Other Services       Precautions / Restrictions Precautions Precautions: Fall;Knee Precaution/Restrictions Comments: no pillow under knee Restrictions Weight Bearing Restrictions Per Provider Order: No LLE Weight Bearing Per Provider Order: Weight bearing as tolerated     Mobility  Bed Mobility Overal bed mobility: Needs Assistance Bed Mobility: Supine to Sit     Supine to sit: Max assist, Total assist, +2 for physical assistance, +2 for safety/equipment     General bed mobility comments: Required Total/Max Assist to slowly transition to EOB with full support L LE and full assist with upper body. Pt unable to lift or move L LE.  Present with MAX EDEMA from hip to foot.   Utilized bed pad to complete scooting.  Once upright and centered, Pt was able to static sit at [facility].  Did c/o dizziness initally which decreased with increased sitting time.  EOB x 7 min while waiting for + 2 assist.    Transfers Overall transfer level: Needs assistance Equipment used: Rolling walker (2 wheels) Transfers: Sit to/from Stand Sit to Stand: Mod assist, Max assist, +2 physical assistance, +2 safety/equipment           General transfer comment: from elevated bed, Pt was able to stand with + 2 side by side Mod/Max Assist.  Again, initial c/o dizziness also present with Nystagmus.  VC's to limit head motion and focus eyes foward.  Symptoms decreased with time as Pt was able to  static stand x 2 min to complete voiding.  (PremaFit External with suction).    Ambulation/Gait Ambulation/Gait assistance: Mod assist, Max assist, +2 safety/equipment Gait Distance (Feet): 2 Feet Assistive device: Rolling walker (2 wheels) Gait Pattern/deviations: Step-to pattern, Decreased stride length, Decreased weight shift to left Gait velocity: decreased     General Gait Details: Very limited amb  distance with poor weight bearing tolerance to L LE and excessive lean/support on walker.  Recliner brought to Pt.  Increased L calf, knee and thigh pain with actvity.  Heavyness L LE with difficulty advancing.  Amb distance limited by MAX c/o fatigue from effort.   Stairs             Wheelchair Mobility     Tilt Bed    Modified Rankin (Stroke Patients Only)       Balance                                            Communication Communication Communication: No apparent difficulties Factors Affecting Communication: Non - English speaking, interpreter not available  Cognition Arousal: Alert Behavior During Therapy: WFL for tasks assessed/performed   PT - Cognitive impairments: History of cognitive impairments                       PT - Cognition Comments: AxO x 3 this session following all commands.  Family present and assisted with translation. Following commands: Intact      Cueing Cueing Techniques: Verbal cues  Exercises  Unable to tolerate any TE's other than AP    General Comments        Pertinent Vitals/Pain Pain Assessment Pain Assessment: Faces Faces Pain Scale: Hurts whole lot Pain Location: L calf , L knee, L thigh Pain Descriptors / Indicators: Discomfort, Throbbing, Sharp, Grimacing, Moaning, Guarding, Operative site guarding Pain Intervention(s): Monitored during session, Premedicated before session, Repositioned, Patient requesting pain meds-RN notified, Ice applied, Heat applied    Home Living                          Prior Function            PT Goals (current goals can now be found in the care plan section) Progress towards PT goals: Progressing toward goals    Frequency    7X/week      PT Plan      Co-evaluation              AM-PAC PT 6 Clicks Mobility   Outcome Measure  Help needed turning from your back to your side while in a flat bed without using bedrails?: Total Help  needed moving from lying on your back to sitting on the side of a flat bed without using bedrails?: Total Help needed moving to and from a bed to a chair (including a wheelchair)?: Total Help needed standing up from a chair using your arms (e.g., wheelchair or bedside chair)?: Total Help needed to walk in hospital room?: Total Help needed climbing 3-5 steps with a railing? : Total 6 Click Score: 6    End of Session Equipment Utilized During Treatment: Gait belt Activity Tolerance: Patient limited by fatigue;Patient limited by pain Patient left: in chair;with call bell/phone within reach;with family/visitor present Nurse Communication: Mobility status PT Visit Diagnosis: Other abnormalities of gait and mobility (  R26.89);Muscle weakness (generalized) (M62.81);Pain Pain - Right/Left: Left Pain - part of body: Knee     Time: 8472-8445 PT Time Calculation (min) (ACUTE ONLY): 27 min  Charges:    $Gait Training: 8-22 mins $Therapeutic Activity: 8-22 mins PT General Charges $$ ACUTE PT VISIT: 1 Visit                     Katheryn Leap  PTA Acute  Rehabilitation Services Office M-F          [phone]

## 2024-05-04 NOTE — Plan of Care (Signed)
  Problem: Clinical Measurements: Goal: Will remain free from infection Outcome: Progressing Goal: Diagnostic test results will improve Outcome: Progressing   Problem: Nutrition: Goal: Adequate nutrition will be maintained Outcome: Progressing   Problem: Coping: Goal: Level of anxiety will decrease Outcome: Progressing   Problem: Pain Managment: Goal: General experience of comfort will improve and/or be controlled Outcome: Progressing   Problem: Safety: Goal: Ability to remain free from injury will improve Outcome: Progressing

## 2024-05-05 LAB — GLUCOSE, CAPILLARY
Glucose-Capillary: 148 mg/dL — ABNORMAL HIGH (ref 70–99)
Glucose-Capillary: 111 mg/dL — ABNORMAL HIGH (ref 70–99)
Glucose-Capillary: 128 mg/dL — ABNORMAL HIGH (ref 70–99)
Glucose-Capillary: 137 mg/dL — ABNORMAL HIGH (ref 70–99)

## 2024-05-05 NOTE — Progress Notes (Signed)
 After standing with assistance next to the bed, increased drainage was noted from knee incision. Drainage appears as active bleeding. Edges marked and dated and photo uploaded to patient's chart. If drainage worsens, Ortho MD will be made aware. Bleeding rechecked at 0000 and has not worsened.

## 2024-05-05 NOTE — Progress Notes (Signed)
 Physical Therapy Treatment Patient Details Name: Brandon Clark MRN: [id number] DOB: 1940-07-20 Today's Date: 05/05/2024   History of Present Illness Pt is 83 yo male presents to therapy s/p L TKA on 04/30/2024 due to failure of conservative measures. Pt hospitalization complicated by DVT on 10/30 and PE with R heart strain requiring O2 on 10/31 with pt transferred to stepdown care. Pt PMH includes but is not limited to: dementia, s/p Nissen fundoplication procedure, hernia, OSA, BPV, HTN, ataxia, DOE, arthritis, and B UE and LE pain.    PT Comments  Pt with some progress today - ambulating 4' with RW and min A x 2 , but mod A x 2 for transfers.  Continues to be limited by calf/knee pain, edema, and some dizziness.  Pt was on 2 L O2 but sats now up to 97% during PT.  Dizziness occurs with transfers and eases 1-2 mins no nystagmus noted today - question if related to medical issues, meds, or BPPV but unable to tolerate testing. Will continue to benefit from therapy. With current mobility may benefit from further rehab at d/c prior to return home -will defer to ortho.    If plan is discharge home, recommend the following: Assistance with cooking/housework;Help with stairs or ramp for entrance;Two people to help with walking and/or transfers;Two people to help with bathing/dressing/bathroom   Can travel by private vehicle        Equipment Recommendations  None recommended by PT    Recommendations for Other Services       Precautions / Restrictions Precautions Precautions: Fall;Knee Restrictions LLE Weight Bearing Per Provider Order: Weight bearing as tolerated     Mobility  Bed Mobility Overal bed mobility: Needs Assistance Bed Mobility: Supine to Sit     Supine to sit: Mod assist, +2 for physical assistance     General bed mobility comments: Needing cues, encouragement, and assist for L LE and trunk.  Some initial dizziness upon sitting, no nystagmus noted, BP stable,  cues for focus point, improved in 1-2 mins    Transfers Overall transfer level: Needs assistance Equipment used: Rolling walker (2 wheels) Transfers: Sit to/from Stand Sit to Stand: Mod assist, +2 physical assistance, From elevated surface           General transfer comment: Light mod x 2 for bed with cues for hand placement; some dizziness but again improved in about a min    Ambulation/Gait Ambulation/Gait assistance: Min assist, +2 safety/equipment Gait Distance (Feet): 4 Feet Assistive device: Rolling walker (2 wheels) Gait Pattern/deviations: Step-to pattern, Decreased stride length, Decreased weight shift to left, Shuffle Gait velocity: decreased     General Gait Details: Pt was able to bear some weight on L LE and take a few steps to the chair; min A of 2 for safety ; pt self advancing L LE today   Stairs             Wheelchair Mobility     Tilt Bed    Modified Rankin (Stroke Patients Only)       Balance Overall balance assessment: Needs assistance Sitting-balance support: No upper extremity supported Sitting balance-Leahy Scale: Good     Standing balance support: Bilateral upper extremity supported, Reliant on assistive device for balance Standing balance-Leahy Scale: Poor Standing balance comment: RW and min A                            Communication Communication Factors Affecting  Communication: Other (comment) (Utilized [employer] [phone])  Cognition Arousal: Alert Behavior During Therapy: WFL for tasks assessed/performed   PT - Cognitive impairments: History of cognitive impairments                       PT - Cognition Comments: A and O , following commands        Cueing    Exercises Total Joint Exercises Ankle Circles/Pumps: AROM, Both, 10 reps, Supine Short Arc Quad: AROM, Right, AAROM, Left, 10 reps, Supine Goniometric ROM: Significant edema throughout L LE; ROM ~15 to 45 degrees    General  Comments General comments (skin integrity, edema, etc.): Pt continues to have some c/o dizziness with transfers that causes spinning for 1-2 mins and then eases off.  No nystagmus noted today.  Pt able to track and no gaze induced nystagmus noted. Unable to tolerate BPPV testing due to knee pain, SOB/resp status, and difficulty with mobility.      Pertinent Vitals/Pain Pain Assessment Pain Assessment: Faces Faces Pain Scale: Hurts whole lot Pain Location: L calf and knee -with transfers Pain Descriptors / Indicators: Sharp, Throbbing, Grimacing, Moaning Pain Intervention(s): Limited activity within patient's tolerance, Monitored during session, Premedicated before session, Repositioned, Ice applied (RN gave morphine prior to PT; pt reports pain improves with meds but then comes back; reports maybe slight improvement today; educated that with TKA and DVT would have pain but will still need to try to move -pt agrees)    Home Living                          Prior Function            PT Goals (current goals can now be found in the care plan section) Progress towards PT goals: Progressing toward goals    Frequency    7X/week      PT Plan      Co-evaluation              AM-PAC PT 6 Clicks Mobility   Outcome Measure  Help needed turning from your back to your side while in a flat bed without using bedrails?: A Lot Help needed moving from lying on your back to sitting on the side of a flat bed without using bedrails?: Total Help needed moving to and from a bed to a chair (including a wheelchair)?: Total Help needed standing up from a chair using your arms (e.g., wheelchair or bedside chair)?: Total Help needed to walk in hospital room?: Total Help needed climbing 3-5 steps with a railing? : Total 6 Click Score: 7    End of Session Equipment Utilized During Treatment: Gait belt Activity Tolerance: Patient limited by pain Patient left: in chair;with call  bell/phone within reach Nurse Communication: Mobility status PT Visit Diagnosis: Other abnormalities of gait and mobility (R26.89);Muscle weakness (generalized) (M62.81);Pain Pain - Right/Left: Left Pain - part of body: Knee     Time: 1050-1120 PT Time Calculation (min) (ACUTE ONLY): 30 min  Charges:    $Therapeutic Exercise: 8-22 mins $Therapeutic Activity: 8-22 mins PT General Charges $$ ACUTE PT VISIT: 1 Visit                     Benjiman, PT Acute Rehab [facility] [phone]    [name] 05/05/2024, 12:17 PM

## 2024-05-05 NOTE — Plan of Care (Signed)
   Problem: Clinical Measurements: Goal: Ability to maintain clinical measurements within normal limits will improve Outcome: Progressing Goal: Will remain free from infection Outcome: Progressing Goal: Diagnostic test results will improve Outcome: Progressing Goal: Respiratory complications will improve Outcome: Progressing   Problem: Nutrition: Goal: Adequate nutrition will be maintained Outcome: Progressing

## 2024-05-05 NOTE — Plan of Care (Signed)
  Problem: Clinical Measurements: Goal: Will remain free from infection Outcome: Progressing Goal: Diagnostic test results will improve Outcome: Progressing Goal: Respiratory complications will improve Outcome: Progressing   Problem: Activity: Goal: Risk for activity intolerance will decrease Outcome: Progressing   Problem: Coping: Goal: Level of anxiety will decrease Outcome: Progressing   Problem: Elimination: Goal: Will not experience complications related to bowel motility Outcome: Not Progressing   Problem: Activity: Goal: Ability to avoid complications of mobility impairment will improve Outcome: Progressing Goal: Range of joint motion will improve Outcome: Progressing   Problem: Clinical Measurements: Goal: Postoperative complications will be avoided or minimized Outcome: Progressing   Problem: Pain Management: Goal: Pain level will decrease with appropriate interventions Outcome: Progressing   Problem: Fluid Volume: Goal: Ability to maintain a balanced intake and output will improve Outcome: Progressing   Problem: Metabolic: Goal: Ability to maintain appropriate glucose levels will improve Outcome: Progressing

## 2024-05-05 NOTE — Progress Notes (Signed)
.  Subjective: 5 Days Post-Op Procedure(s) (LRB): ARTHROPLASTY, KNEE, TOTAL (Left)   Patient seen and examined. Pain mildly improved today. Remains on Eliquis   Activity level:  WBAT Diet tolerance:  minimal Voiding:  ok Patient reports pain as moderate .   Objective: Vital signs in last 24 hours: Temp:  [97.6 F (36.4 C)-98.2 F (36.8 C)] 97.9 F (36.6 C) (11/02 0859) Pulse Rate:  [84-114] 103 (11/02 1121) Resp:  [20-31] 20 (11/02 0400) BP: (117-151)/(76-121) 134/88 (11/02 0900) SpO2:  [87 %-99 %] 93 % (11/02 1121)  Labs: Recent Labs    05/03/24 1027  HGB 13.9   Recent Labs    05/03/24 1027  WBC 7.6  RBC 4.75  HCT 42.9  PLT 158   Recent Labs    05/03/24 1027  NA 138  K 4.6  CL 99  CO2 29  BUN 22  CREATININE 0.93  GLUCOSE 127*  CALCIUM 8.9   No results for input(s): LABPT, INR in the last 72 hours.  Physical Exam:  Neurologically intact ABD soft Neurovascular intact Sensation intact distally Intact pulses distally Dorsiflexion/Plantar flexion intact Incision: dressing C/D/I with scan drainage on aquacell No cellulitis present Discomfort with calf squ  Assessment/Plan:  5 Days Post-Op Procedure(s) (LRB): ARTHROPLASTY, KNEE, TOTAL (Left)  WBAT Up with PT Advance diet Continue Eliquis for DVT/PE. Appreciate medicine recs Pain control as needed    Brandon Clark Mon 05/05/2024, 11:46 AM

## 2024-05-05 NOTE — Progress Notes (Signed)
 PROGRESS NOTE    Brandon Clark  FMW:[id number] DOB: 26-Oct-1940 DOA: 04/30/2024 PCP: Gladystine Erminio CROME, MD   Brief Narrative:  83 year old male with history of GERD/hiatal hernia/PUD, osteoarthritis, BPH, dementia, hyperlipidemia, hypertension, prediabetes, sleep apnea not on CPAP underwent left total knee replacement on 04/30/2024 by orthopedics.  TRH consulted on 05/03/2024 for dyspnea, dizziness and new oxygen requirement.  She was found to have left acute peroneal vein DVT on 05/02/2024 and was already started on Eliquis.  Because of dyspnea and oxygen requirement, he was supposed to be switched to heparin  drip but because of patient's faith/religion, she would not want to take pork to right products including heparin .  Eliquis was continued.  CTA chest showed acute PE with right heart strain.  Assessment & Plan:   Acute PE with right heart strain Acute DVT of left peroneal veins Acute respiratory failure with hypoxia - Patient still in requiring supplemental oxygen via nasal cannula at 2 L/min.  Wean off as able.  Imaging as above.  Will continue Eliquis as discussed above. 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  Continue hydrochlorothiazide  - Continue statin  Sleep apnea - Continue CPAP  Probable dementia -continue memantine and donepezil   Diabetes mellitus type 2 -A1c 5.9.  Continue CBGs with SSI  BPH -continue finasteride   Obesity class II - Outpatient follow-up   Subjective: Patient seen and examined at bedside.  No vomiting, fever, worsening shortness of breath reported.  Objective: Vitals:   05/05/24 0300 05/05/24 0400 05/05/24 0529 05/05/24 0600  BP: (!) 127/93 (!) 143/98 138/76 (!) 148/77  Pulse: (!) 105 (!) 102 85  88  Resp: (!) 22 20    Temp:      TempSrc:      SpO2: 93% 97% 96% (!) 87%  Weight:      Height:        Intake/Output Summary (Last 24 hours) at 05/05/2024 0724 Last data filed at 05/05/2024 0330 Gross per 24 hour  Intake --  Output 750 ml  Net -750 ml   Filed Weights   04/30/24 1056 05/03/24 1900 05/03/24 2327  Weight: 120.7 kg 120.7 kg 122 kg    Examination:  General exam: No distress.  Still on 2 L oxygen by nasal cannula  respiratory system: Decreased breath sounds at bases bilaterally with some crackles Cardiovascular system: Rate mostly controlled; S1 and S2 are heard Gastrointestinal system: Abdomen is slightly distended, soft and nontender.  Bowel sounds are heard Extremities: Mild lower extremity edema; no cyanosis   Data Reviewed: I have personally reviewed following labs and imaging studies  CBC: Recent Labs  Lab 05/01/24 1911 05/03/24 1027  WBC 10.0 7.6  HGB 14.5 13.9  HCT 46.2 42.9  MCV 90.2 90.3  PLT 174 158   Basic Metabolic Panel: Recent Labs  Lab 05/01/24 1911 05/03/24 1027  NA 138 138  K 4.2 4.6  CL 101 99  CO2 28 29  GLUCOSE 129* 127*  BUN 21 22  CREATININE 1.03 0.93  CALCIUM 8.8* 8.9  MG  --  2.2  PHOS  --  2.9   GFR: Estimated Creatinine Clearance: 84.1 mL/min (by C-G formula based on SCr of 0.93 mg/dL). Liver Function Tests: Recent Labs  Lab 05/01/24 1911 05/03/24 1027  AST 38 34  ALT  23 21  ALKPHOS 61 73  BILITOT 0.7 0.9  PROT 6.1* 6.2*  ALBUMIN 3.8 3.7   No results for input(s): LIPASE, AMYLASE in the last 168 hours. No results for input(s): AMMONIA in the last 168 hours. Coagulation Profile: No results for input(s): INR, PROTIME in the last 168 hours. Cardiac Enzymes: No results for input(s): CKTOTAL, CKMB, CKMBINDEX, TROPONINI in the last 168 hours. BNP (last 3 results) No results for input(s): PROBNP in the last 8760 hours. HbA1C: Recent Labs    05/04/24 0240  HGBA1C 5.9*   CBG: Recent  Labs  Lab 05/04/24 0026 05/04/24 0727 05/04/24 1236 05/04/24 1722 05/04/24 2248  GLUCAP 146* 138* 106* 137* 174*   Lipid Profile: No results for input(s): CHOL, HDL, LDLCALC, TRIG, CHOLHDL, LDLDIRECT in the last 72 hours. Thyroid  Function Tests: No results for input(s): TSH, T4TOTAL, FREET4, T3FREE, THYROIDAB in the last 72 hours. Anemia Panel: No results for input(s): VITAMINB12, FOLATE, FERRITIN, TIBC, IRON, RETICCTPCT in the last 72 hours. Sepsis Labs: No results for input(s): PROCALCITON, LATICACIDVEN in the last 168 hours.  Recent Results (from the past 240 hours)  MRSA Next Gen by PCR, Nasal     Status: None   Collection Time: 05/03/24 11:43 PM   Specimen: Nasal Mucosa; Nasal Swab  Result Value Ref Range Status   MRSA by PCR Next Gen NOT DETECTED NOT DETECTED Final    Comment: (NOTE) The GeneXpert MRSA Assay (FDA approved for NASAL specimens only), is one component of a comprehensive MRSA colonization surveillance program. It is not intended to diagnose MRSA infection nor to guide or monitor treatment for MRSA infections. Test performance is not FDA approved in patients less than 13 years old. Performed at [facility], [address], [place], [place] [address]          Radiology Studies: ECHOCARDIOGRAM COMPLETE Result Date: 05/03/2024    ECHOCARDIOGRAM REPORT   Patient Name:   [name] [facility] Date of Exam: 05/03/2024 Medical Rec #:  [id number]                 Height:       72.0 in Accession #:    [id number]                Weight:       266.0 lb Date of Birth:  [birth year]                 BSA:          2.405 m Patient Age:    83 years                  BP:           152/102 mmHg Patient Gender: M                         HR:           113 bpm. Exam Location:  Inpatient Procedure: 2D Echo, Cardiac Doppler and Color Doppler (Both Spectral and Color            Flow Doppler were utilized during procedure).  Indications:    Dyspnea R06.00  History:        Patient has no prior history of Echocardiogram examinations.                 Risk Factors:Hypertension and Dyslipidemia.  Sonographer:    Tinnie Gosling RDCS Referring Phys: [phone] DAVID DORN  ORTIZ IMPRESSIONS  1. Patient tachycardic throughout the exam. Left ventricular ejection fraction, by estimation, is 60 to 65%. The left ventricle has hyperdynamic function. The left ventricle has no regional wall motion abnormalities. There is mild concentric left ventricular hypertrophy. Left ventricular diastolic parameters were normal.  2. Right ventricular systolic function is normal. The right ventricular size is moderately enlarged. Tricuspid regurgitation signal is inadequate for assessing PA pressure.  3. The mitral valve is normal in structure. No evidence of mitral valve regurgitation. No evidence of mitral stenosis.  4. The aortic valve is tricuspid. Aortic valve regurgitation is not visualized. No aortic stenosis is present.  5. Aortic dilatation noted. There is mild dilatation of the ascending aorta, measuring 42 mm.  6. The inferior vena cava is dilated in size with >50% respiratory variability, suggesting right atrial pressure of 8 mmHg. Conclusion(s)/Recommendation(s): Hyperdynamic LV systolic function. Moderate RV enlargement with preserved function. Mild dilation of ascending thoracic aorta measuring 4.2 cm. Recommend repeat thoracic imaging via echo or CT in 1 year to ensure stability in size. FINDINGS  Left Ventricle: Patient tachycardic throughout the exam. Left ventricular ejection fraction, by estimation, is 60 to 65%. The left ventricle has hyperdynamic function. The left ventricle has no regional wall motion abnormalities. The left ventricular internal cavity size was normal in size. There is mild concentric left ventricular hypertrophy. Left ventricular diastolic parameters were normal. Right Ventricle: The right ventricular size is moderately  enlarged. No increase in right ventricular wall thickness. Right ventricular systolic function is normal. Tricuspid regurgitation signal is inadequate for assessing PA pressure. Left Atrium: Left atrial size was normal in size. Right Atrium: Right atrial size was normal in size. Pericardium: There is no evidence of pericardial effusion. Mitral Valve: The mitral valve is normal in structure. No evidence of mitral valve regurgitation. No evidence of mitral valve stenosis. Tricuspid Valve: The tricuspid valve is normal in structure. Tricuspid valve regurgitation is trivial. No evidence of tricuspid stenosis. Aortic Valve: The aortic valve is tricuspid. Aortic valve regurgitation is not visualized. No aortic stenosis is present. Pulmonic Valve: The pulmonic valve was normal in structure. Pulmonic valve regurgitation is not visualized. No evidence of pulmonic stenosis. Aorta: Aortic dilatation noted. There is mild dilatation of the ascending aorta, measuring 42 mm. Venous: The inferior vena cava is dilated in size with greater than 50% respiratory variability, suggesting right atrial pressure of 8 mmHg. IAS/Shunts: The interatrial septum was not well visualized.  LEFT VENTRICLE PLAX 2D LVIDd:         5.00 cm   Diastology LVIDs:         3.90 cm   LV e' lateral:   10.20 cm/s LV PW:         1.10 cm   LV E/e' lateral: 7.1 LV IVS:        1.10 cm LVOT diam:     2.10 cm LV SV:         53 LV SV Index:   22 LVOT Area:     3.46 cm LV IVRT:       74 msec  RIGHT VENTRICLE             IVC RV S prime:     18.30 cm/s  IVC diam: 2.30 cm TAPSE (M-mode): 1.9 cm LEFT ATRIUM           Index LA diam:      3.30 cm 1.37 cm/m LA Vol (A2C): 16.2 ml 6.74 ml/m  AORTIC VALVE LVOT  Vmax:   123.00 cm/s LVOT Vmean:  90.900 cm/s LVOT VTI:    0.152 m  AORTA Ao Root diam: 3.90 cm Ao Asc diam:  4.20 cm MITRAL VALVE MV Area (PHT): 6.27 cm     SHUNTS MV Decel Time: 121 msec     Systemic VTI:  0.15 m MV E velocity: 72.80 cm/s   Systemic Diam: 2.10 cm MV A  velocity: 102.00 cm/s MV E/A ratio:  0.71 Georganna Archer Electronically signed by Georganna Archer Signature Date/Time: 05/03/2024/2:07:52 PM    Final    CT Angio Chest Pulmonary Embolism (PE) W or WO Contrast Result Date: 05/03/2024 EXAM: CTA of the Chest with contrast for PE 05/03/2024 01:07:38 PM TECHNIQUE: CTA of the chest was performed without and with the administration of 80 mL of iohexol  (OMNIPAQUE ) 350 MG/ML injection. Multiplanar reformatted images are provided for review. MIP images are provided for review. Automated exposure control, iterative reconstruction, and/or weight based adjustment of the mA/kV was utilized to reduce the radiation dose to as low as reasonably achievable. COMPARISON: None available. CLINICAL HISTORY: Pulmonary embolism (PE) suspected, high prob; Just diagnosed with DVT. FINDINGS: PULMONARY ARTERIES: Evaluation of the pulmonary arteries is limited secondary to respiratory motion artifact. Suspected nonocclusive pulmonary emboli in the segmental branches of the right upper lobe (series 5, image 155). Additional occlusive pulmonary embolism in a segmental branch of a lingular artery (series 5, image 170). Main pulmonary artery is normal in caliber. MEDIASTINUM: The heart and pericardium demonstrate no acute abnormality. There is evidence of right heart strain with RV/LV ratio of 46/35 mm. Ascending aorta measuring 43 mm. Thyroidectomy. LYMPH NODES: No mediastinal, hilar or axillary lymphadenopathy. LUNGS AND PLEURA: Atelectasis in the right greater than left lower lobes. No focal consolidation or pulmonary edema. No pleural effusion or pneumothorax. UPPER ABDOMEN: Limited images of the upper abdomen demonstrate hepatic steatosis. Small hiatal hernia . SOFT TISSUES AND BONES: No acute bone or soft tissue abnormality. IMPRESSION: 1. Suspected nonocclusive pulmonary emboli in the segmental branches of the right upper lobe and additional occlusive pulmonary embolism in a segmental  branch of a lingular artery. 2. Evidence of right heart strain with RV/LV ratio of 1.3. 3. 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 Recommend annual imaging followup by CTA or MRA. This recommendation follows 2010 ACCF/AHA/AATS/ACR/ASA/SCA/SCAI/SIR/STS/SVM Guidelines for the Diagnosis and Management of Patients with Thoracic Aortic Disease. Circulation. 2010; 121: Z733-z630. Aortic aneurysm NOS (ICD10-I71.9) 4. Results will be called by the radiology assistant and documented within [facility]. Electronically signed by: Norman Gatlin MD 05/03/2024 01:58 PM EDT RP Workstation: [id number]        Scheduled Meds:  amitriptyline  25 mg Oral QHS   apixaban  10 mg Oral BID   Followed by   NOREEN ON 05/09/2024] apixaban  5 mg Oral BID   Chlorhexidine  Gluconate Cloth  6 each Topical Daily   docusate sodium  100 mg Oral BID   donepezil   10 mg Oral QHS   feeding supplement  1 Container Oral TID BM   feeding supplement  237 mL Oral BID BM   finasteride   5 mg Oral Daily   hydrochlorothiazide   12.5 mg Oral Daily   insulin aspart  0-15 Units Subcutaneous TID WC   memantine  10 mg Oral BID   simvastatin   10 mg Oral QHS   Continuous Infusions:  lactated ringers      lactated ringers      lactated ringers      lactated ringers  Stopped (05/01/24 1226)  Sophie Mao, MD [facility] 05/05/2024, 7:24 AM

## 2024-05-05 NOTE — Progress Notes (Signed)
 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 Pt hospitalization complicated by DVT on 10/30 and PE with R heart strain requiring O2 on 10/31 with pt transferred to stepdown care. Pt PMH includes but is not limited to: dementia, s/p Nissen fundoplication procedure, hernia, OSA, BPV, HTN, ataxia, DOE, arthritis, and B UE and LE pain.    PT Comments  Pt ready to return to bed.  Similar presentation to morning session - needing 2 people to assist, walking~4' limited by pain.  Dizziness occurs with transfers and eases after 1-2 mins.  VSS on 2 L O2.  Continue POC.     If plan is discharge home, recommend the following: Assistance with cooking/housework;Help with stairs or ramp for entrance;Two people to help with walking and/or transfers;Two people to help with bathing/dressing/bathroom   Can travel by private vehicle        Equipment Recommendations  None recommended by PT    Recommendations for Other Services       Precautions / Restrictions Precautions Precautions: Fall;Knee Restrictions LLE Weight Bearing Per Provider Order: Weight bearing as tolerated     Mobility  Bed Mobility Overal bed mobility: Needs Assistance Bed Mobility: Sit to Supine     Supine to sit: Mod assist, +2 for physical assistance Sit to supine: Mod assist, +2 for physical assistance   General bed mobility comments: Assist for leg and trunk to return to bed.  Did get pt to lay flat to try to assess dizziness.  He did get dizzy upon return to supine,  no nystagmus noted, pt not tolerating flat and improved when elevated.    Transfers Overall transfer level: Needs assistance Equipment used: Rolling walker (2 wheels) Transfers: Sit to/from Stand, Bed to chair/wheelchair/BSC Sit to Stand: Mod  assist, +2 physical assistance, From elevated surface   Step pivot transfers: Min assist, +2 physical assistance       General transfer comment: STS x 2 from recliner with cues for hand placement and use of momentum.  Improved control with sitting but does need assist to advance L LE for pain control    Ambulation/Gait Ambulation/Gait assistance: Min assist, +2 safety/equipment Gait Distance (Feet): 4 Feet Assistive device: Rolling walker (2 wheels) Gait Pattern/deviations: Step-to pattern, Decreased stride length, Decreased weight shift to left, Shuffle Gait velocity: decreased     General Gait Details: Pt was able to bear some weight on L LE and take a few steps with chair follow; min A of 2 for safety ; pt self advancing L LE today   Stairs             Wheelchair Mobility     Tilt Bed    Modified Rankin (Stroke Patients Only)       Balance Overall balance assessment: Needs assistance Sitting-balance support: No upper extremity supported Sitting balance-Leahy Scale: Good     Standing balance support: Bilateral upper extremity supported, Reliant on assistive device for balance Standing balance-Leahy Scale: Poor Standing balance comment: RW and min A                            Communication Communication Factors Affecting Communication: Other (comment) ([employer] interpretor [id number])  Cognition Arousal: Alert Behavior During Therapy: WFL for tasks assessed/performed   PT -  Cognitive impairments: History of cognitive impairments                       PT - Cognition Comments: A and O , following commands        Cueing    Exercises Total Joint Exercises Ankle Circles/Pumps: AROM, Both, 10 reps, Supine Short Arc Quad: AROM, Right, AAROM, Left, 10 reps, Supine Knee Flexion: AAROM, Left, 10 reps, Seated Goniometric ROM: Significant edema throughout L LE; ROM ~15 to 45 degrees    General Comments General comments (skin integrity, edema,  etc.): Pt continues to have some c/o dizziness with transfers that causes spinning for 1-2 mins and then eases off.  No nystagmus noted today.  Pt able to track and no gaze induced nystagmus noted. Unable to tolerate BPPV testing due to knee pain, SOB/resp status, and difficulty with mobility.      Pertinent Vitals/Pain Pain Assessment Pain Assessment: Faces Faces Pain Scale: Hurts even more Pain Location: L calf and knee -with transfers Pain Descriptors / Indicators: Sharp, Throbbing, Grimacing, Moaning Pain Intervention(s): Limited activity within patient's tolerance, Monitored during session, Premedicated before session, Repositioned, Ice applied    Home Living                          Prior Function            PT Goals (current goals can now be found in the care plan section) Progress towards PT goals: Progressing toward goals    Frequency    7X/week      PT Plan      Co-evaluation              AM-PAC PT 6 Clicks Mobility   Outcome Measure  Help needed turning from your back to your side while in a flat bed without using bedrails?: A Lot Help needed moving from lying on your back to sitting on the side of a flat bed without using bedrails?: Total Help needed moving to and from a bed to a chair (including a wheelchair)?: Total Help needed standing up from a chair using your arms (e.g., wheelchair or bedside chair)?: Total Help needed to walk in hospital room?: Total Help needed climbing 3-5 steps with a railing? : Total 6 Click Score: 7    End of Session Equipment Utilized During Treatment: Gait belt Activity Tolerance: Patient limited by pain Patient left: in chair;with call bell/phone within reach Nurse Communication: Mobility status PT Visit Diagnosis: Other abnormalities of gait and mobility (R26.89);Muscle weakness (generalized) (M62.81);Pain Pain - Right/Left: Left Pain - part of body: Knee     Time: 1221-1247 PT Time Calculation  (min) (ACUTE ONLY): 26 min  Charges:    $Therapeutic Exercise: 8-22 mins $Therapeutic Activity: 8-22 mins PT General Charges $$ ACUTE PT VISIT: 1 Visit                     Brandon, PT Acute Rehab [facility] [phone]    Brandon Clark 05/05/2024, 1:29 PM

## 2024-05-06 LAB — GLUCOSE, CAPILLARY
Glucose-Capillary: 113 mg/dL — ABNORMAL HIGH (ref 70–99)
Glucose-Capillary: 114 mg/dL — ABNORMAL HIGH (ref 70–99)
Glucose-Capillary: 128 mg/dL — ABNORMAL HIGH (ref 70–99)
Glucose-Capillary: 151 mg/dL — ABNORMAL HIGH (ref 70–99)
Glucose-Capillary: 174 mg/dL — ABNORMAL HIGH (ref 70–99)

## 2024-05-06 MED ORDER — POLYETHYLENE GLYCOL 3350 17 G PO PACK
17.0000 g | PACK | Freq: Every day | ORAL | Status: DC | PRN
  Administered 2024-05-06: 17 g via ORAL
  Filled 2024-05-06: qty 1

## 2024-05-06 MED ORDER — INSULIN ASPART 100 UNIT/ML IJ SOLN: 0.0000 [IU] | Freq: Every day | INTRAMUSCULAR | Status: DC

## 2024-05-06 MED ORDER — INSULIN ASPART 100 UNIT/ML IJ SOLN
0.0000 [IU] | Freq: Three times a day (TID) | INTRAMUSCULAR | Status: DC
  Administered 2024-05-07 (×2): 3 [IU] via SUBCUTANEOUS
  Administered 2024-05-07 – 2024-05-09 (×4): 2 [IU] via SUBCUTANEOUS
  Administered 2024-05-09: 3 [IU] via SUBCUTANEOUS
  Administered 2024-05-09 – 2024-05-13 (×11): 2 [IU] via SUBCUTANEOUS
  Filled 2024-05-06 (×12): qty 2
  Filled 2024-05-06: qty 3
  Filled 2024-05-06: qty 2

## 2024-05-06 NOTE — Plan of Care (Signed)
  Problem: Clinical Measurements: Goal: Ability to maintain clinical measurements within normal limits will improve Outcome: Progressing Goal: Will remain free from infection Outcome: Progressing Goal: Diagnostic test results will improve Outcome: Progressing Goal: Respiratory complications will improve Outcome: Progressing   Problem: Activity: Goal: Risk for activity intolerance will decrease Outcome: Progressing   Problem: Nutrition: Goal: Adequate nutrition will be maintained Outcome: Progressing   Problem: Elimination: Goal: Will not experience complications related to bowel motility Outcome: Not Progressing

## 2024-05-06 NOTE — Progress Notes (Signed)
 PT Cancellation Note  Patient Details Name: Brandon Clark MRN: [id number] DOB: 1941/04/13   Cancelled Treatment:    Reason Eval/Treat Not Completed: Patient declined, no reason specified Patient stating that he  wants to  rest, declined  therapy. He stated  that therapy can return later .  [name] PT Acute Rehabilitation Services Office [phone]   Potters Darice Norris 05/06/2024, 10:05 AM

## 2024-05-06 NOTE — Progress Notes (Signed)
 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 Pt hospitalization complicated by DVT on 10/30 and PE with R heart strain requiring O2 on 10/31 with pt transferred to stepdown care. Pt PMH includes but is not limited to: dementia, s/p Nissen fundoplication procedure, hernia, OSA, BPV, HTN, ataxia, DOE, arthritis, and B UE and LE pain.    PT Comments  Patient  agreeable to mobility this PM.Son present and interpreted for  patient.  Patient required mod support to sitting, +2 max assistance to stand and pivot BSc using RW  then max support o2 to stand and have  recliner brought up. Patient making effort to stand, LLE quite edematous and painful with WB.  Patient  HR into 120' . Continue PT. Patient will benefit from intensive inpatient follow-up therapy, >3 hours/day    If plan is discharge home, recommend the following: Assistance with cooking/housework;Help with stairs or ramp for entrance;Two people to help with walking and/or transfers;Two people to help with bathing/dressing/bathroom   Can travel by private vehicle        Equipment Recommendations  None recommended by PT    Recommendations for Other Services Rehab consult     Precautions / Restrictions Precautions Precautions: Fall;Knee Precaution/Restrictions Comments: no pillow under knee Restrictions Weight Bearing Restrictions Per Provider Order: Yes LLE Weight Bearing Per Provider Order: Weight bearing as tolerated     Mobility  Bed Mobility Overal bed mobility: Needs Assistance Bed Mobility: Supine to Sit     Supine to sit: Mod assist, +2 for physical assistance     General bed mobility comments: extra time, gentle  assist to move the  LLE to bed edge, assist trunk to sitting    Transfers Overall  transfer level: Needs assistance Equipment used: Rolling walker (2 wheels) Transfers: Sit to/from Stand, Bed to chair/wheelchair/BSC Sit to Stand: Max assist, +2 physical assistance, +2 safety/equipment, From elevated surface   Step pivot transfers: Max assist, +2 physical assistance, +2 safety/equipment       General transfer comment: decreased WB on LLE tolerqated, trnasfer to BSc, Then max assist to rise from [facility], not standing erect, recliner brought up.    Ambulation/Gait                   Stairs             Wheelchair Mobility     Tilt Bed    Modified Rankin (Stroke Patients Only)       Balance Overall balance assessment: Needs assistance Sitting-balance support: No upper extremity supported Sitting balance-Leahy Scale: Fair Sitting balance - Comments: UE support,, mod A , posterior bias, reports some disZziness   Standing balance support: Bilateral upper extremity supported, Reliant on assistive device for balance Standing balance-Leahy Scale: Poor Standing balance comment: RW and max, not straightening right knee                            Communication Communication Communication: No apparent difficulties Factors Affecting Communication: Non - English speaking, interpreter not available (Pt agrees for son to translate during therapy session)  Cognition Arousal: Alert Behavior During Therapy: WFL for tasks assessed/performed   PT - Cognitive impairments: History of cognitive impairments  PT - Cognition Comments: A and O , following commands Following commands: Intact      Cueing Cueing Techniques: Verbal cues, Gestural cues  Exercises Total Joint Exercises Long Arc Quad: AAROM, 5 reps, Left    General Comments        Pertinent Vitals/Pain Pain Assessment Faces Pain Scale: Hurts whole lot Pain Location: L calf and knee -with transfers and any attempts to move the leg Pain Descriptors /  Indicators: Sharp, Throbbing, Grimacing, Moaning Pain Intervention(s): Monitored during session, Premedicated before session, Limited activity within patient's tolerance, Ice applied, Repositioned    Home Living Family/patient expects to be discharged to:: Private residence Living Arrangements: Children Available Help at Discharge: Family;Available 24 hours/day Type of Home: House Home Access: Stairs to enter   [employer] of Steps: 1 threshold Alternate Level Stairs-Number of Steps: Pt typically stays upstairs but has full bath downstairs and can convert office to bedroom if needed.  Full flight of steps Home Layout: Multi-level Home Equipment: Rolling Walker (2 wheels);Cane - single point;Shower seat      Prior Function            PT Goals (current goals can now be found in the care plan section) Progress towards PT goals: Progressing toward goals    Frequency    7X/week      PT Plan      Co-evaluation PT/OT/SLP Co-Evaluation/Treatment: Yes Reason for Co-Treatment: Complexity of the patient's impairments (multi-system involvement) PT goals addressed during session: Mobility/safety with mobility OT goals addressed during session: ADL's and self-care      AM-PAC PT 6 Clicks Mobility   Outcome Measure  Help needed turning from your back to your side while in a flat bed without using bedrails?: A Lot Help needed moving from lying on your back to sitting on the side of a flat bed without using bedrails?: Total Help needed moving to and from a bed to a chair (including a wheelchair)?: Total Help needed standing up from a chair using your arms (e.g., wheelchair or bedside chair)?: Total Help needed to walk in hospital room?: Total Help needed climbing 3-5 steps with a railing? : Total 6 Click Score: 7    End of Session Equipment Utilized During Treatment: Gait belt Activity Tolerance: Patient limited by pain Patient left: in chair;with call bell/phone  within reach;with chair alarm set;with family/visitor present Nurse Communication: Mobility status;Need for lift equipment PT Visit Diagnosis: Other abnormalities of gait and mobility (R26.89);Muscle weakness (generalized) (M62.81);Pain Pain - Right/Left: Left Pain - part of body: Knee     Time: 1323-1409 PT Time Calculation (min) (ACUTE ONLY): 46 min  Charges:    $Therapeutic Activity: 8-22 mins PT General Charges $$ ACUTE PT VISIT: 1 Visit                     [name] PT Acute Rehabilitation Services Office [phone]    Potters Darice Norris 05/06/2024, 3:00 PM

## 2024-05-06 NOTE — Evaluation (Signed)
 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 Pt hospitalization complicated by DVT on 10/30 and PE with R heart strain requiring O2 on 10/31 with pt transferred to stepdown care. Pt PMH includes but is not limited to: dementia, s/p Nissen fundoplication procedure, hernia, OSA, BPV, HTN, ataxia, DOE, arthritis, and B UE and LE pain.     Clinical Impressions Pt was seen for OT evaluation this date. Son at bed side, pt agrees for son to translate for him. Prior to hospital admission, pt was mostly MODI, ambulating with use of SPC. Pt lives with his children in multi level home, able to stay on the first floor if need be. Pt presents with deficits in decreased Ind in self care, balance, functional mobility/transfers, activity tolerance, and safety awareness affecting safe and optimal ADL completion. Pt currently requires MODA+2 for bed mobility with use of bed rails, pt STS from elevated bed height with MAXA+2 to step pivot into BSC with MAXA+2 throughout with heavy cues for sequencing and hand placement. Pt would benefit from skilled OT services to address noted impairments and functional limitations (see below for any additional details) in order to maximize safety and independence while minimizing future risk of falls, injury, and readmission. Patient will benefit from intensive inpatient follow-up therapy, >3 hours/day.       If plan is discharge home, recommend the following:   A lot of help with walking and/or transfers;A lot of help with bathing/dressing/bathroom;Assistance with cooking/housework;Assist for transportation;Help with stairs or ramp for entrance     Functional Status Assessment   Patient has had a recent decline in their functional status and  demonstrates the ability to make significant improvements in function in a reasonable and predictable amount of time.     Equipment Recommendations   Other (comment) (Defer to next venue of care)     Recommendations for Other Services   Rehab consult     Precautions/Restrictions   Precautions Precautions: Fall;Knee Precaution Booklet Issued: No Recall of Precautions/Restrictions: Intact Precaution/Restrictions Comments: no pillow under knee Restrictions Weight Bearing Restrictions Per Provider Order: Yes LLE Weight Bearing Per Provider Order: Weight bearing as tolerated     Mobility Bed Mobility Overal bed mobility: Needs Assistance Bed Mobility: Supine to Sit     Supine to sit: Mod assist, +2 for physical assistance Sit to supine: Mod assist, +2 for physical assistance   General bed mobility comments: Assist for gentle movement for LLE    Transfers Overall transfer level: Needs assistance Equipment used: Rolling walker (2 wheels) Transfers: Sit to/from Stand, Bed to chair/wheelchair/BSC Sit to Stand: Max assist, +2 physical assistance, +2 safety/equipment, From elevated surface     Step pivot transfers: Max assist, +2 physical assistance, +2 safety/equipment     General transfer comment: Required MAXA+2 for STS from [facility], tolerated standing pericare      Balance Overall balance assessment: Needs assistance Sitting-balance support: No upper extremity supported Sitting balance-Leahy Scale: Fair Sitting balance - Comments: UE support,, mod A , posterior bias, reports some disZziness   Standing balance support: Bilateral upper extremity supported, Reliant on assistive device for balance Standing balance-Leahy Scale: Poor Standing balance comment: RW and max, not straightening right knee  ADL either performed or assessed with clinical judgement   ADL Overall ADL's : Needs assistance/impaired Eating/Feeding: Set  up;Sitting   Grooming: Wash/dry face;Set up;Sitting   Upper Body Bathing: Set up;Sitting   Lower Body Bathing: Maximal assistance;Sitting/lateral leans;+2 for safety/equipment   Upper Body Dressing : Minimal assistance;Sitting   Lower Body Dressing: Maximal assistance;Sitting/lateral leans;+2 for safety/equipment   Toilet Transfer: Stand-pivot;Rolling walker (2 wheels);BSC/3in1;Maximal assistance;+2 for safety/equipment   Toileting- Clothing Manipulation and Hygiene: Maximal assistance;+2 for safety/equipment;Sit to/from stand       Functional mobility during ADLs: Maximal assistance;+2 for safety/equipment;Cueing for sequencing;Cueing for safety;Rolling walker (2 wheels) General ADL Comments: Son in room to assist therapy, pt is able to completed UB seated grooming with setupA     Vision Baseline Vision/History: 0 No visual deficits                         Pertinent Vitals/Pain Pain Assessment Pain Assessment: 0-10 Pain Score: 7  Pain Descriptors / Indicators: Sharp, Throbbing, Grimacing, Moaning Pain Intervention(s): Limited activity within patient's tolerance, Monitored during session, Premedicated before session     Extremity/Trunk Assessment Upper Extremity Assessment Upper Extremity Assessment: Generalized weakness   Lower Extremity Assessment Lower Extremity Assessment: Defer to PT evaluation   Cervical / Trunk Assessment Cervical / Trunk Assessment: Normal   Communication Communication Communication: No apparent difficulties Factors Affecting Communication: Non - English speaking, interpreter not available (Pt agrees for son to translate during therapy session)   Cognition Arousal: Alert Behavior During Therapy: WFL for tasks assessed/performed Cognition: Difficult to assess Difficult to assess due to: Non-English speaking                             Following commands: Intact       Cueing  General Comments   Cueing Techniques:  Verbal cues;Gestural cues  VSS HR 126bpm during transfers   Exercises Exercises: Other exercises Other Exercises Other Exercises: Edu: Role of OT session, safe ADL completion, transfer training         Home Living Family/patient expects to be discharged to:: Private residence Living Arrangements: Children Available Help at Discharge: Family;Available 24 hours/day Type of Home: House Home Access: Stairs to enter [employer] of Steps: 1 threshold   Home Layout: Multi-level Alternate Level Stairs-Number of Steps: Pt typically stays upstairs but has full bath downstairs and can convert office to bedroom if needed.  Full flight of steps Alternate Level Stairs-Rails: Right Bathroom Shower/Tub: Tub/shower unit   Bathroom Toilet: Standard     Home Equipment: Agricultural Consultant (2 wheels);Cane - single point;Shower seat          Prior Functioning/Environment Prior Level of Function : Independent/Modified Independent             Mobility Comments: Prefers to not use cane but benefits from it per family; could ambulate in community (if holding grocery cart) ADLs Comments: independent with adls and light iadls    OT Problem List: Decreased strength;Decreased range of motion;Decreased activity tolerance;Impaired balance (sitting and/or standing);Decreased coordination;Decreased safety awareness;Decreased knowledge of use of DME or AE;Pain   OT Treatment/Interventions: Self-care/ADL training;Therapeutic exercise;Energy conservation;DME and/or AE instruction;Therapeutic activities;Patient/family education;Balance training      OT Goals(Current goals can be found in the care plan section)   Acute Rehab OT Goals Patient Stated Goal: Heal his knee then get the other knee done OT Goal Formulation: With patient/family Time For Goal Achievement: 05/20/24  Potential to Achieve Goals: Good ADL Goals Pt Will Perform Grooming: with set-up;sitting Pt Will Perform Lower Body  Dressing: sitting/lateral leans;with mod assist Pt Will Transfer to Toilet: ambulating;with max assist Pt Will Perform Toileting - Clothing Manipulation and hygiene: sit to/from stand;with mod assist   OT Frequency:  Min 3X/week    Co-evaluation PT/OT/SLP Co-Evaluation/Treatment: Yes Reason for Co-Treatment: Complexity of the patient's impairments (multi-system involvement) PT goals addressed during session: Mobility/safety with mobility OT goals addressed during session: ADL's and self-care      AM-PAC OT 6 Clicks Daily Activity     Outcome Measure Help from another person eating meals?: None Help from another person taking care of personal grooming?: A Little Help from another person toileting, which includes using toliet, bedpan, or urinal?: A Lot Help from another person bathing (including washing, rinsing, drying)?: A Lot Help from another person to put on and taking off regular upper body clothing?: A Little Help from another person to put on and taking off regular lower body clothing?: A Lot 6 Click Score: 16   End of Session Equipment Utilized During Treatment: Gait belt;Rolling walker (2 wheels) Nurse Communication: Mobility status  Activity Tolerance: Patient tolerated treatment well Patient left: in chair;with call bell/phone within reach;with chair alarm set;with nursing/sitter in room;with family/visitor present  OT Visit Diagnosis: Unsteadiness on feet (R26.81);Other abnormalities of gait and mobility (R26.89);Muscle weakness (generalized) (M62.81)                Time: 8676-8590 OT Time Calculation (min): 46 min Charges:  OT General Charges $OT Visit: 1 Visit OT Evaluation $OT Eval High Complexity: 1 High OT Treatments $Self Care/Home Management : 8-22 mins  Larraine Colas M.S. OTR/L  05/06/24, 3:11 PM

## 2024-05-06 NOTE — Progress Notes (Signed)
 Inpatient Rehab Admissions Coordinator:   Per therapy recommendations pt was screened for CIR by Reche Lowers, PT, DPT.  At this time pt may be a candidate for CIR and I will place a consult order per our protocol.  Note pt listed as having worker's comp as primary payor but unclear whether this was an elective admission or related to a workplace injury.  If he is identified as having a different payor we may not be able to get authorization for CIR.  An AC will f/u.   Reche Lowers, PT, DPT Admissions Coordinator [phone] 05/06/24 4:01 PM

## 2024-05-06 NOTE — Progress Notes (Signed)
 Subjective: 6 Days Post-Op Procedure(s) (LRB): ARTHROPLASTY, KNEE, TOTAL (Left)  Patient is resting comfortably in chair bedside. Family is here with him. He is starting to eat again and he is now off of O2 and is breathing well on room air. He still complains of pain in his left leg but it is slightly improving compared to last week.  Activity level:  wbat Diet tolerance:  ok Voiding:  ok Patient reports pain as moderate and severe.    Objective: Vital signs in last 24 hours: Temp:  [97.4 F (36.3 C)-98.6 F (37 C)] 98.2 F (36.8 C) (11/03 1200) Pulse Rate:  [88-104] 92 (11/03 1200) Resp:  [16-28] 16 (11/03 1200) BP: (100-146)/(56-104) 136/77 (11/03 1200) SpO2:  [90 %-100 %] 95 % (11/03 1200)  Labs: No results for input(s): HGB in the last 72 hours. No results for input(s): WBC, RBC, HCT, PLT in the last 72 hours. No results for input(s): NA, K, CL, CO2, BUN, CREATININE, GLUCOSE, CALCIUM in the last 72 hours. No results for input(s): LABPT, INR in the last 72 hours.  Physical Exam:  Neurologically intact ABD soft Neurovascular intact Sensation intact distally Intact pulses distally Dorsiflexion/Plantar flexion intact Incision: dressing C/D/I and scant drainage No cellulitis present Compartment soft  Assessment/Plan:  6 Days Post-Op Procedure(s) (LRB): ARTHROPLASTY, KNEE, TOTAL (Left) Advance diet Up with therapy We greatly appreciate medical management.  He will try to get up and walk with PT. If he continues to bleed through his bandage then I will change it tomorrow. We will leave it up to the medical team to decide when he can be transferred back to the ortho floor. Once he is cleared medically then we can decide on wether he can be discharged home or to SNF depending on how he does with PT.    Brandon Clark 05/06/2024, 2:30 PM

## 2024-05-06 NOTE — Progress Notes (Signed)
 PROGRESS NOTE    Brandon Clark  FMW:[id number] DOB: 05/16/1941 DOA: 04/30/2024 PCP: Gladystine Erminio CROME, MD   Brief Narrative:  83 year old male with history of GERD/hiatal hernia/PUD, osteoarthritis, BPH, dementia, hyperlipidemia, hypertension, prediabetes, sleep apnea not on CPAP underwent left total knee replacement on 04/30/2024 by orthopedics.  TRH consulted on 05/03/2024 for dyspnea, dizziness and new oxygen requirement.  She was found to have left acute peroneal vein DVT on 05/02/2024 and was already started on Eliquis.  Because of dyspnea and oxygen requirement, he was supposed to be switched to heparin  drip but because of patient's faith/religion, she would not want to take pork to right products including heparin .  Eliquis was continued.  CTA chest showed acute PE with right heart strain.  Assessment & Plan:   Acute PE with right heart strain Acute DVT of left peroneal veins Acute respiratory failure with hypoxia - Respiratory status improving: Currently on room air.  Imaging as above.  Continue Eliquis. 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  Continue hydrochlorothiazide  - Continue statin  Sleep apnea - Continue CPAP  Probable dementia -continue memantine and donepezil   Diabetes mellitus type 2 -A1c 5.9.  Continue CBGs with SSI  BPH -continue finasteride   Obesity class II - Outpatient follow-up   Subjective: Patient seen and examined at bedside.  No fever, vomiting, worsening abdominal reported.  Has intermittent cough. Objective: Vitals:   05/06/24 0300 05/06/24 0330 05/06/24 0400 05/06/24 0500  BP: 135/63  (!) 134/96 119/85  Pulse: 89  (!) 104 92  Resp: 19  (!) 23 (!) 22  Temp:  98.1 F (36.7 C)    TempSrc:  Oral     SpO2: 94%  100% 94%  Weight:      Height:        Intake/Output Summary (Last 24 hours) at 05/06/2024 0726 Last data filed at 05/06/2024 0600 Gross per 24 hour  Intake --  Output 750 ml  Net -750 ml   Filed Weights   04/30/24 1056 05/03/24 1900 05/03/24 2327  Weight: 120.7 kg 120.7 kg 122 kg    Examination:  General: On room air.  No acute distress.  respiratory: Bilateral decreased breath sounds at bases with scattered crackles and intermittent tachypnea  CVS: Currently rate controlled; S1-S2 heard  abdominal: Soft, obese, nontender, distended mildly, no organomegaly; normal bowel sounds are heard  extremities: Trace lower extremity edema; no clubbing.      Data Reviewed: I have personally reviewed following labs and imaging studies  CBC: Recent Labs  Lab 05/01/24 1911 05/03/24 1027  WBC 10.0 7.6  HGB 14.5 13.9  HCT 46.2 42.9  MCV 90.2 90.3  PLT 174 158   Basic Metabolic Panel: Recent Labs  Lab 05/01/24 1911 05/03/24 1027  NA 138 138  K 4.2 4.6  CL 101 99  CO2 28 29  GLUCOSE 129* 127*  BUN 21 22  CREATININE 1.03 0.93  CALCIUM 8.8* 8.9  MG  --  2.2  PHOS  --  2.9   GFR: Estimated Creatinine Clearance: 84.1 mL/min (by C-G formula based on SCr of 0.93 mg/dL). Liver Function Tests: Recent Labs  Lab 05/01/24 1911 05/03/24 1027  AST 38 34  ALT 23 21  ALKPHOS 61 73  BILITOT 0.7 0.9  PROT 6.1* 6.2*  ALBUMIN 3.8 3.7   No results for input(s): LIPASE, AMYLASE in the last 168 hours. No results for input(s): AMMONIA in the last 168 hours. Coagulation Profile: No results for input(s): INR, PROTIME in the last 168 hours. Cardiac Enzymes: No results for input(s): CKTOTAL, CKMB, CKMBINDEX, TROPONINI in the last 168 hours. BNP (last 3 results) No results for input(s): PROBNP in the last 8760 hours. HbA1C: Recent Labs    05/04/24 0240  HGBA1C 5.9*   CBG: Recent Labs  Lab 05/04/24 2248 05/05/24 0746 05/05/24 1128  05/05/24 1601 05/05/24 2129  GLUCAP 174* 148* 111* 128* 137*   Lipid Profile: No results for input(s): CHOL, HDL, LDLCALC, TRIG, CHOLHDL, LDLDIRECT in the last 72 hours. Thyroid  Function Tests: No results for input(s): TSH, T4TOTAL, FREET4, T3FREE, THYROIDAB in the last 72 hours. Anemia Panel: No results for input(s): VITAMINB12, FOLATE, FERRITIN, TIBC, IRON, RETICCTPCT in the last 72 hours. Sepsis Labs: No results for input(s): PROCALCITON, LATICACIDVEN in the last 168 hours.  Recent Results (from the past 240 hours)  MRSA Next Gen by PCR, Nasal     Status: None   Collection Time: 05/03/24 11:43 PM   Specimen: Nasal Mucosa; Nasal Swab  Result Value Ref Range Status   MRSA by PCR Next Gen NOT DETECTED NOT DETECTED Final    Comment: (NOTE) The GeneXpert MRSA Assay (FDA approved for NASAL specimens only), is one component of a comprehensive MRSA colonization surveillance program. It is not intended to diagnose MRSA infection nor to guide or monitor treatment for MRSA infections. Test performance is not FDA approved in patients less than 43 years old. Performed at [facility], [address] [place], [place] [address]          Radiology Studies: No results found.       Scheduled Meds:  amitriptyline  25 mg Oral QHS   apixaban  10 mg Oral BID   Followed by   NOREEN ON 05/09/2024] apixaban  5 mg Oral BID   Chlorhexidine  Gluconate Cloth  6 each Topical Daily   docusate sodium  100 mg Oral BID   donepezil   10 mg Oral QHS   feeding supplement  1 Container Oral TID BM   feeding supplement  237 mL Oral BID BM   finasteride   5 mg Oral Daily   hydrochlorothiazide   12.5 mg Oral Daily   insulin aspart  0-15 Units Subcutaneous TID WC   memantine  10 mg Oral BID   simvastatin   10 mg Oral QHS   Continuous Infusions:  lactated ringers      lactated ringers      lactated ringers      lactated ringers  Stopped (05/01/24  1226)          Sophie Mao, MD [facility] 05/06/2024, 7:26 AM

## 2024-05-07 ENCOUNTER — Encounter (HOSPITAL_COMMUNITY): Payer: Self-pay | Admitting: Orthopaedic Surgery

## 2024-05-07 DIAGNOSIS — I82452 Acute embolism and thrombosis of left peroneal vein: Secondary | ICD-10-CM

## 2024-05-07 LAB — GLUCOSE, CAPILLARY
Glucose-Capillary: 123 mg/dL — ABNORMAL HIGH (ref 70–99)
Glucose-Capillary: 173 mg/dL — ABNORMAL HIGH (ref 70–99)
Glucose-Capillary: 176 mg/dL — ABNORMAL HIGH (ref 70–99)
Glucose-Capillary: 110 mg/dL — ABNORMAL HIGH (ref 70–99)

## 2024-05-07 NOTE — Progress Notes (Addendum)
 Verbal order from Nida, [place] for the Iceman to be ordered/applied for patient comfort. Attempted to call OR for machine. Per OR team to call back in 5 min for further instructions on machine. @1430  received ICEMAN machine from OR and applied to patients left lower leg.

## 2024-05-07 NOTE — Progress Notes (Signed)
 Subjective: 7 Days Post-Op Procedure(s) (LRB): ARTHROPLASTY, KNEE, TOTAL (Left)  Patient resting comfortably this morning. CIR has been consulted. Medicine team has signed off.   Activity level:  wbat Diet tolerance:  ok Voiding:  ok Patient reports pain as moderate and severe.    Objective: Vital signs in last 24 hours: Temp:  [97.8 F (36.6 C)-98.8 F (37.1 C)] 98.5 F (36.9 C) (11/04 0300) Pulse Rate:  [86-107] 103 (11/04 0800) Resp:  [16-31] 31 (11/04 0800) BP: (117-160)/(56-84) 160/70 (11/04 0700) SpO2:  [86 %-99 %] 89 % (11/04 0800)  Labs: No results for input(s): HGB in the last 72 hours. No results for input(s): WBC, RBC, HCT, PLT in the last 72 hours. No results for input(s): NA, K, CL, CO2, BUN, CREATININE, GLUCOSE, CALCIUM in the last 72 hours. No results for input(s): LABPT, INR in the last 72 hours.  Physical Exam:  Neurologically intact ABD soft Neurovascular intact Sensation intact distally Intact pulses distally Dorsiflexion/Plantar flexion intact Incision: dressing C/D/I and scant drainage No cellulitis present Compartment soft  Assessment/Plan:  7 Days Post-Op Procedure(s) (LRB): ARTHROPLASTY, KNEE, TOTAL (Left) Advance diet Up with therapy Hopefully he will discharge to CIR or possibly SNF as he has not gotten up and been able to walk with PT yet. Continue on eliquis for his DVT/PE Follow up in office 2 weeks post op.    Brandon Clark [place] Darlynn Ricco 05/07/2024, 9:47 AM

## 2024-05-07 NOTE — Plan of Care (Signed)
  Problem: Clinical Measurements: Goal: Postoperative complications will be avoided or minimized Outcome: Progressing   Problem: Pain Management: Goal: Pain level will decrease with appropriate interventions Outcome: Progressing   Problem: Skin Integrity: Goal: Will show signs of wound healing Outcome: Progressing   Problem: Coping: Goal: Ability to adjust to condition or change in health will improve Outcome: Progressing

## 2024-05-07 NOTE — Progress Notes (Signed)
 Physical Therapy Treatment Patient Details Name: Brandon Clark MRN: [id number] DOB: 08-Jul-1940 Today's Date: 05/07/2024   History of Present Illness Pt is 83 yo male presents to therapy s/p L TKA on 04/30/2024 due to failure of conservative measures. Pt hospitalization complicated by DVT on 10/30 and PE with R heart strain requiring O2 on 10/31 with pt transferred to stepdown care. Pt PMH includes but is not limited to: dementia, s/p Nissen fundoplication procedure, hernia, OSA, BPV, HTN, ataxia, DOE, arthritis, and B UE and LE pain.    PT Comments  Patient quite cheerful, tolerated mobility of lift back to bed, Assisted with positioning LLE on 2 pillows and ICE Man placed. Patient very pleased with  being able to  assist moving the LLE with the belt. Patient did  report dizziness when lying flat after return to be via lift.  Dizziness appeared to resolve when sat upright in bed.   Continue PT/mobility, trnasfers. Patient will benefit from intensive inpatient follow-up therapy, >3 hours/day    If plan is discharge home, recommend the following: Assistance with cooking/housework;Help with stairs or ramp for entrance;Two people to help with walking and/or transfers;Two people to help with bathing/dressing/bathroom   Can travel by private vehicle        Equipment Recommendations  None recommended by PT    Recommendations for Other Services Rehab consult     Precautions / Restrictions Precautions Precautions: Fall;Knee Precaution Booklet Issued: No Recall of Precautions/Restrictions: Intact Precaution/Restrictions Comments: no pillow under knee Restrictions LLE Weight Bearing Per Provider Order: Weight bearing as tolerated     Mobility  Bed Mobility Overal bed mobility: Needs Assistance Bed Mobility:   Supine to sit:      General bed mobility comments: patient able to sit upright with  pulling on the horizontal bar of maxisky to remove lift pad after returned to bed     Transfers     General transfer comment: chair to bed.+2 max. LLE supported Transfer via Materials Engineer Bed    Modified Rankin (Stroke Patients Only)       Balance Overall balance assessment: Needs assistance Sitting-balance support: No upper extremity supported Sitting balance-Leahy Scale: Fair Sitting balance - Comments: UE support,, mod A , reports some disZziness   Standing balance support: Bilateral upper extremity supported, Reliant on assistive device for balance Standing balance-Leahy Scale: Poor                              Communication Communication Communication: No apparent difficulties Factors Affecting Communication: Non - English speaking, interpreter not available (interpreter used(no Number recorded)  Cognition Arousal: Alert Behavior During Therapy: WFL for tasks assessed/performed, Anxious                           PT - Cognition Comments: patient cheerful joking, is expressing concern that he sees blood under dressing. RN explained to pt via interprester. Following commands: Intact      Cueing Cueing Techniques: Verbal cues, Gestural cues  Exercises   General Comments        Pertinent Vitals/Pain Pain Assessment Pain Assessment: Faces Faces Pain Scale: Hurts even more Pain Location: leftm knee Pain  Descriptors / Indicators: Grimacing, Guarding Pain Intervention(s): Monitored during session, Repositioned, Ice applied, RN gave pain meds during session    Home Living                          Prior Function            PT Goals (current goals can now be found in the care plan section) Progress towards PT goals: Progressing toward goals    Frequency    7X/week      PT Plan      Co-evaluation              AM-PAC PT 6 Clicks Mobility   Outcome Measure  Help needed turning  from your back to your side while in a flat bed without using bedrails?: A Lot Help needed moving from lying on your back to sitting on the side of a flat bed without using bedrails?: A Lot Help needed moving to and from a bed to a chair (including a wheelchair)?: Total Help needed standing up from a chair using your arms (e.g., wheelchair or bedside chair)?: Total Help needed to walk in hospital room?: Total Help needed climbing 3-5 steps with a railing? : Total 6 Click Score: 8    End of Session Equipment Utilized During Treatment: Gait belt Activity Tolerance: Patient tolerated treatment well Patient left: in bed;with call bell/phone within reach;with bed alarm set Nurse Communication: Mobility status;Need for lift equipment PT Visit Diagnosis: Other abnormalities of gait and mobility (R26.89);Muscle weakness (generalized) (M62.81);Pain Pain - Right/Left: Left Pain - part of body: Knee     Time: 1400-1451 PT Time Calculation (min) (ACUTE ONLY): 51 min  Charges:    $Therapeutic Activity: 38-52 mins PT General Charges $$ ACUTE PT VISIT: 1 Visit                     Brandon Clark PT Acute Rehabilitation Services Office [phone]    Brandon Clark 05/07/2024, 4:02 PM

## 2024-05-07 NOTE — Progress Notes (Signed)
 Physical Therapy Treatment Patient Details Name: Brandon Clark MRN: [id number] DOB: 1941-03-18 Today's Date: 05/07/2024   History of Present Illness Pt is 83 yo male presents to therapy s/p L TKA on 04/30/2024 due to failure of conservative measures. Pt hospitalization complicated by DVT on 10/30 and PE with R heart strain requiring O2 on 10/31 with pt transferred to stepdown care. Pt PMH includes but is not limited to: dementia, s/p Nissen fundoplication procedure, hernia, OSA, BPV, HTN, ataxia, DOE, arthritis, and B UE and LE pain.    PT Comments  The  Patient is ready  to get up. Interpreter [phone] utilized. Patient assisted self  and used belt to move LLE to bed edge and push to sit upright. Patient assisted to stand from raised bed at platform RW, Patient took small steps to turn to recliner, very antalgic on the LLE.   Patient did tolerate LLE AAROM exercises, assisting himself using the belt. Patient in good spirits when no is pain.  Patient tolerated and stood much better today with platform RW.   Patient does report dizziness upon sitting and after turning and sitting to recliner which appears to subside. Patient will benefit from intensive inpatient follow-up therapy, >3 hours/day     If plan is discharge home, recommend the following: Assistance with cooking/housework;Help with stairs or ramp for entrance;Two people to help with walking and/or transfers;Two people to help with bathing/dressing/bathroom   Can travel by private vehicle        Equipment Recommendations  None recommended by PT    Recommendations for Other Services Rehab consult     Precautions / Restrictions Precautions Precautions: Fall;Knee Precaution Booklet Issued: No Recall of Precautions/Restrictions: Intact Precaution/Restrictions Comments: no pillow under knee Restrictions LLE Weight Bearing Per Provider Order: Weight bearing as tolerated     Mobility  Bed Mobility   Bed Mobility:  Supine to Sit     Supine to sit: Mod assist, +2 for physical assistance     General bed mobility comments: Assist for gentle movement for LLE, use of belt and pillow to slide leg, pt able to push to sitting upright, PT and belt tosupport of LLE to floor    Transfers Overall transfer level: Needs assistance Equipment used: Bilateral platform walker Transfers: Sit to/from Stand, Bed to chair/wheelchair/BSC Sit to Stand: Max assist, +2 physical assistance, +2 safety/equipment, From elevated surface   Step pivot transfers: Max assist, +2 physical assistance, +2 safety/equipment       General transfer comment: patient crying out with WB on LLE. Patient was able to TDWB on LLE  to turn and recliner brought up. patient able to reach back and lower self to sitting. Much rearrangement of recliner  for comfort of the left leg    Ambulation/Gait                   Stairs             Wheelchair Mobility     Tilt Bed    Modified Rankin (Stroke Patients Only)       Balance Overall balance assessment: Needs assistance Sitting-balance support: No upper extremity supported Sitting balance-Leahy Scale: Fair Sitting balance - Comments: UE support,, mod A , reports some disZziness   Standing balance support: Bilateral upper extremity supported, Reliant on assistive device for balance Standing balance-Leahy Scale: Poor  Communication Communication Communication: No apparent difficulties Factors Affecting Communication: Non - English speaking, interpreter not available  Cognition Arousal: Alert Behavior During Therapy: WFL for tasks assessed/performed, Anxious, Lability                           PT - Cognition Comments: A and O , following commands with interpreter Malissa [id number] Following commands: Intact      Cueing Cueing Techniques: Verbal cues, Gestural cues  Exercises Total Joint Exercises Ankle  Circles/Pumps: AROM, Both, 10 reps, Supine Quad Sets: AROM, Both, 5 reps Heel Slides: AAROM, Left, 5 reps Straight Leg Raises: AAROM, Left, 15 reps Long Arc Quad: AAROM, Left, 10 reps Goniometric ROM: significant edema, noted demarcated blod/fluid under  dressing. ROM left kne 15 to ~ 45 when seated    General Comments        Pertinent Vitals/Pain Pain Assessment Pain Assessment: Faces Faces Pain Scale: Hurts worst Pain Location: L calf and knee -with transfers and any attempts to move the leg Pain Descriptors / Indicators: Sharp, Grimacing, Moaning, Crying Pain Intervention(s): Premedicated before session, Ice applied, Repositioned    Home Living                          Prior Function            PT Goals (current goals can now be found in the care plan section) Progress towards PT goals: Progressing toward goals    Frequency    7X/week      PT Plan      Co-evaluation              AM-PAC PT 6 Clicks Mobility   Outcome Measure  Help needed turning from your back to your side while in a flat bed without using bedrails?: A Lot Help needed moving from lying on your back to sitting on the side of a flat bed without using bedrails?: A Lot Help needed moving to and from a bed to a chair (including a wheelchair)?: Total Help needed standing up from a chair using your arms (e.g., wheelchair or bedside chair)?: Total Help needed to walk in hospital room?: Total Help needed climbing 3-5 steps with a railing? : Total 6 Click Score: 8    End of Session Equipment Utilized During Treatment: Gait belt Activity Tolerance: Patient limited by pain Patient left: in chair;with call bell/phone within reach;with chair alarm set Nurse Communication: Mobility status;Need for lift equipment PT Visit Diagnosis: Other abnormalities of gait and mobility (R26.89);Muscle weakness (generalized) (M62.81);Pain Pain - Right/Left: Left Pain - part of body: Knee     Time:  8856-8784 PT Time Calculation (min) (ACUTE ONLY): 32 min  Charges:    $Therapeutic Activity: 23-37 mins PT General Charges $$ ACUTE PT VISIT: 1 Visit                     [name] PT Acute Rehabilitation Services Office [phone]    Potters Darice Norris 05/07/2024, 3:53 PM

## 2024-05-07 NOTE — Progress Notes (Signed)
 Inpatient Rehab Admissions Coordinator:   Left a message for pt's son to discuss rehab recommendations and goals/expectations of rehab stay.   Reche Lowers, PT, DPT Admissions Coordinator [phone] 05/07/24 11:56 AM

## 2024-05-07 NOTE — Progress Notes (Signed)
 With interpreter, patient informed this nurse by yelling, I am not your husband, I am your patient. You do not boss me around. Patient was educated that all instruction is for his benefit to help him continue to get better with his leg and yelling/screaming at staff and the interpreter will delay communication. Patient then informed this nurse, I am an American citizen, you do what I say. This nurse then informed the patient that we are all here to help him, we understand he is in pain, but we will all respect one another by not screaming, using the resources we have to communicate well, and just talk to us  as opposed to yell. Patient refused acknowledgement of this information.

## 2024-05-07 NOTE — Progress Notes (Signed)
 Interpreter was used to complete patient's assessment and to explain what will occur through the night. Per interpreter patient states he is no pain at the moment and the he would prefer to use urinal to void. Patient stated he doesn't have any questions at the moment.

## 2024-05-07 NOTE — Progress Notes (Signed)
 Patient is currently on Eliquis and currently tolerating it.  This can be continued on discharge.  He is intermittently requiring 2 L oxygen via nasal cannula.  Chart review indicates that he has remained hemodynamically stable.  Rest of the management is up as per primary orthopedics team.  [facility] service will sign off.  Please recall us  if needed.

## 2024-05-08 ENCOUNTER — Inpatient Hospital Stay (HOSPITAL_COMMUNITY): Payer: Self-pay

## 2024-05-08 DIAGNOSIS — N400 Enlarged prostate without lower urinary tract symptoms: Secondary | ICD-10-CM

## 2024-05-08 DIAGNOSIS — I10 Essential (primary) hypertension: Secondary | ICD-10-CM

## 2024-05-08 LAB — CBC WITH DIFFERENTIAL/PLATELET
Abs Immature Granulocytes: 0.25 K/uL — ABNORMAL HIGH (ref 0.00–0.07)
Basophils Absolute: 0.1 K/uL (ref 0.0–0.1)
Basophils Relative: 1 %
Eosinophils Absolute: 0.3 K/uL (ref 0.0–0.5)
Eosinophils Relative: 4 %
HCT: 33.3 % — ABNORMAL LOW (ref 39.0–52.0)
Hemoglobin: 10.8 g/dL — ABNORMAL LOW (ref 13.0–17.0)
Immature Granulocytes: 3 %
Lymphocytes Relative: 22 %
Lymphs Abs: 1.8 K/uL (ref 0.7–4.0)
MCH: 29.3 pg (ref 26.0–34.0)
MCHC: 32.4 g/dL (ref 30.0–36.0)
MCV: 90.2 fL (ref 80.0–100.0)
Monocytes Absolute: 1.4 K/uL — ABNORMAL HIGH (ref 0.1–1.0)
Monocytes Relative: 17 %
Neutro Abs: 4.5 K/uL (ref 1.7–7.7)
Neutrophils Relative %: 53 %
Platelets: 281 K/uL (ref 150–400)
RBC: 3.69 MIL/uL — ABNORMAL LOW (ref 4.22–5.81)
RDW: 12.9 % (ref 11.5–15.5)
WBC: 8.4 K/uL (ref 4.0–10.5)
nRBC: 0 % (ref 0.0–0.2)

## 2024-05-08 LAB — COMPREHENSIVE METABOLIC PANEL WITH GFR
ALT: 62 U/L — ABNORMAL HIGH (ref 0–44)
AST: 57 U/L — ABNORMAL HIGH (ref 15–41)
Albumin: 3.2 g/dL — ABNORMAL LOW (ref 3.5–5.0)
Alkaline Phosphatase: 177 U/L — ABNORMAL HIGH (ref 38–126)
Anion gap: 10 (ref 5–15)
BUN: 17 mg/dL (ref 8–23)
CO2: 29 mmol/L (ref 22–32)
Calcium: 9.1 mg/dL (ref 8.9–10.3)
Chloride: 96 mmol/L — ABNORMAL LOW (ref 98–111)
Creatinine, Ser: 0.83 mg/dL (ref 0.61–1.24)
GFR, Estimated: >60 mL/min (ref >60)
Glucose, Bld: 131 mg/dL — ABNORMAL HIGH (ref 70–99)
Potassium: 4.2 mmol/L (ref 3.5–5.1)
Sodium: 134 mmol/L — ABNORMAL LOW (ref 135–145)
Total Bilirubin: 1.3 mg/dL — ABNORMAL HIGH (ref 0.0–1.2)
Total Protein: 6.3 g/dL — ABNORMAL LOW (ref 6.5–8.1)

## 2024-05-08 LAB — GLUCOSE, CAPILLARY
Glucose-Capillary: 136 mg/dL — ABNORMAL HIGH (ref 70–99)
Glucose-Capillary: 124 mg/dL — ABNORMAL HIGH (ref 70–99)
Glucose-Capillary: 123 mg/dL — ABNORMAL HIGH (ref 70–99)
Glucose-Capillary: 115 mg/dL — ABNORMAL HIGH (ref 70–99)
Glucose-Capillary: 169 mg/dL — ABNORMAL HIGH (ref 70–99)

## 2024-05-08 NOTE — Plan of Care (Signed)
   Problem: Health Behavior/Discharge Planning: Goal: Ability to manage health-related needs will improve Outcome: Progressing   Problem: Clinical Measurements: Goal: Ability to maintain clinical measurements within normal limits will improve Outcome: Progressing Goal: Will remain free from infection Outcome: Progressing Goal: Diagnostic test results will improve Outcome: Progressing Goal: Respiratory complications will improve Outcome: Progressing   Problem: Activity: Goal: Risk for activity intolerance will decrease Outcome: Progressing

## 2024-05-08 NOTE — Progress Notes (Signed)
 Occupational Therapy Treatment Patient Details Name: Brandon Clark MRN: [id number] DOB: 02/28/1941 Today's Date: 05/08/2024   History of present illness Pt is 83 yo male presents to therapy s/p L TKA on 04/30/2024 due to failure of conservative measures. Pt hospitalization complicated by DVT on 10/30 and PE with R heart strain requiring O2 on 10/31 with pt transferred to stepdown care. Pt PMH includes but is not limited to: dementia, s/p Nissen fundoplication procedure, hernia, OSA, BPV, HTN, ataxia, DOE, arthritis, and B UE and LE pain.   OT comments  Pt seen for OT/PT co-treatment on this date. Upon arrival to room pt semi supine in bed, agreeable to tx. Pt requires MODA+2 for bed mobility, minimal LLE support to slide leg to the edge of bed in prep for transfers. Pt STS from sightly less elevated bed height than previous session with bilateral platform walker and MODA+2 for safety and physical assistance. Pt required MAX-TOTALA for LB dressing and continues to benefit from education for polar care and positioning. Pt making good progress toward goals, will continue to follow POC. Discharge recommendation remains appropriate.        If plan is discharge home, recommend the following:  A lot of help with walking and/or transfers;A lot of help with bathing/dressing/bathroom;Assistance with cooking/housework;Assist for transportation;Help with stairs or ramp for entrance   Equipment Recommendations  Other (comment)    Recommendations for Other Services Rehab consult    Precautions / Restrictions Precautions Precautions: Fall;Knee Precaution Booklet Issued: No Recall of Precautions/Restrictions: Intact Precaution/Restrictions Comments: no pillow under knee Restrictions Weight Bearing Restrictions Per Provider Order: Yes LLE Weight Bearing Per Provider Order: Weight bearing as tolerated       Mobility Bed Mobility Overal bed mobility: Needs Assistance Bed Mobility: Supine  to Sit     Supine to sit: Mod assist, +2 for physical assistance     General bed mobility comments: Verbal cues for hand placement on bed rails to assist    Transfers Overall transfer level: Needs assistance Equipment used: Bilateral platform walker Transfers: Sit to/from Stand, Bed to chair/wheelchair/BSC Sit to Stand: Mod assist, +2 safety/equipment, +2 physical assistance     Step pivot transfers: Mod assist, +2 physical assistance, +2 safety/equipment, From elevated surface     General transfer comment: Bil platform RW, MODA+2 from elevated bed height     Balance Overall balance assessment: Needs assistance Sitting-balance support: No upper extremity supported Sitting balance-Leahy Scale: Fair     Standing balance support: Bilateral upper extremity supported, Reliant on assistive device for balance Standing balance-Leahy Scale: Poor                             ADL either performed or assessed with clinical judgement   ADL Overall ADL's : Needs assistance/impaired Eating/Feeding: Set up;Sitting   Grooming: Wash/dry face;Set up;Sitting               Lower Body Dressing: Total assistance;Sitting/lateral leans   Toilet Transfer: Cueing for safety;Cueing for sequencing;Moderate assistance;+2 for safety/equipment (platform walker) Toilet Transfer Details (indicate cue type and reason): Simulated toilet t/f into the recliner         Functional mobility during ADLs: Moderate assistance;+2 for safety/equipment;+2 for physical assistance (platform walker) General ADL Comments: TOTAL don/doff LB dressing items, MODA+2 simulated toilet transfer into recliner    Communication Communication Communication: No apparent difficulties Factors Affecting Communication: Non - English speaking, interpreter not available;Other (comment) (Reem [id number])  Cognition Arousal: Alert Behavior During Therapy: WFL for tasks assessed/performed, Anxious Cognition:  Difficult to assess Difficult to assess due to: Non-English speaking                             Following commands: Intact        Cueing   Cueing Techniques: Verbal cues, Gestural cues  Exercises Exercises: Other exercises Other Exercises Other Exercises: Edu: Role of OT, transfer training with use of platform walker       General Comments Post operative dressing D/C/I pre/post session    Pertinent Vitals/ Pain       Pain Assessment Pain Assessment: 0-10 Pain Score: 6  Pain Location: L Knee Pain Descriptors / Indicators: Grimacing, Guarding, Crying Pain Intervention(s): Limited activity within patient's tolerance, Monitored during session, Premedicated before session                                                          Frequency  Min 3X/week        Progress Toward Goals  OT Goals(current goals can now be found in the care plan section)  Progress towards OT goals: Progressing toward goals  Acute Rehab OT Goals OT Goal Formulation: With patient/family Time For Goal Achievement: 05/20/24 Potential to Achieve Goals: Good ADL Goals Pt Will Perform Grooming: with set-up;sitting Pt Will Perform Lower Body Dressing: sitting/lateral leans;with mod assist Pt Will Transfer to Toilet: ambulating;with max assist Pt Will Perform Toileting - Clothing Manipulation and hygiene: sit to/from stand;with mod assist  Plan      Co-evaluation    PT/OT/SLP Co-Evaluation/Treatment: Yes Reason for Co-Treatment: Complexity of the patient's impairments (multi-system involvement) PT goals addressed during session: Mobility/safety with mobility OT goals addressed during session: ADL's and self-care      AM-PAC OT 6 Clicks Daily Activity     Outcome Measure   Help from another person eating meals?: None Help from another person taking care of personal grooming?: A Little Help from another person toileting, which includes using toliet,  bedpan, or urinal?: A Lot Help from another person bathing (including washing, rinsing, drying)?: A Lot Help from another person to put on and taking off regular upper body clothing?: A Little Help from another person to put on and taking off regular lower body clothing?: A Lot 6 Click Score: 16    End of Session Equipment Utilized During Treatment: Gait belt;Other (comment) (platform walker)  OT Visit Diagnosis: Unsteadiness on feet (R26.81);Other abnormalities of gait and mobility (R26.89);Muscle weakness (generalized) (M62.81)   Activity Tolerance Patient tolerated treatment well   Patient Left in chair;with call bell/phone within reach;with chair alarm set;with nursing/sitter in room   Nurse Communication Mobility status        Time: 9080-9047 OT Time Calculation (min): 33 min  Charges: OT General Charges $OT Visit: 1 Visit OT Treatments $Therapeutic Activity: 8-22 mins  Larraine Colas M.S. OTR/L  05/08/24, 11:04 AM

## 2024-05-08 NOTE — Progress Notes (Signed)
 Inpatient Rehab Admissions Coordinator:   Met with pt at bedside to explain recommendations for rehab and goals/expectations of rehab stay.  Pt is open to rehab, reports he lives with his son, but that he lives on the second floor and doesn't feel like he can stay on the first floor with his son.  I've not been able to reach the son on the phone today or tomorrow, but left my number at his bedside for son to call me and discuss caregiver support.  During our visit pt c/o sudden onset chest pain which he describes as 6/10 rating and pressure in sensation.  RN notified and communicated with MD.  I will continue to follow.  Will need to discuss plans with pt's son for after rehab support and need to send for approval with pt's worker's comp.    Reche Lowers, PT, DPT Admissions Coordinator [phone] 05/08/24 4:35 PM

## 2024-05-08 NOTE — Progress Notes (Signed)
 Subjective: 8 Days Post-Op Procedure(s) (LRB): ARTHROPLASTY, KNEE, TOTAL (Left)  Patient resting comfortably in bed this morning. He is awaiting on possible CIR placement. Pain seems better today.  Activity level:  wabt Diet tolerance:  ok Voiding:  ok Patient reports pain as mild and moderate.    Objective: Vital signs in last 24 hours: Temp:  [97.8 F (36.6 C)-98.6 F (37 C)] 98.3 F (36.8 C) (11/05 0553) Pulse Rate:  [83-104] 83 (11/05 0553) Resp:  [15-29] 18 (11/05 0553) BP: (108-144)/(72-91) 108/72 (11/05 0553) SpO2:  [92 %-100 %] 95 % (11/05 0553)  Labs: No results for input(s): HGB in the last 72 hours. No results for input(s): WBC, RBC, HCT, PLT in the last 72 hours. No results for input(s): NA, K, CL, CO2, BUN, CREATININE, GLUCOSE, CALCIUM in the last 72 hours. No results for input(s): LABPT, INR in the last 72 hours.  Physical Exam:  Neurologically intact ABD soft Neurovascular intact Sensation intact distally Intact pulses distally Dorsiflexion/Plantar flexion intact Incision: dressing C/D/I and moderate drainage No cellulitis present Compartment soft  Assessment/Plan:  8 Days Post-Op Procedure(s) (LRB): ARTHROPLASTY, KNEE, TOTAL (Left) Advance diet Up with therapy Bandage was changed today. Continue on eliquis for dvt/pe Hopefully he will be accepted to CIR today. If not we will have to check on SNF placement.    Brandon Clark 05/08/2024, 8:22 AM

## 2024-05-08 NOTE — Progress Notes (Signed)
 Pt and family provided verbal consent for heparin  if needed. MD aware.

## 2024-05-08 NOTE — Progress Notes (Signed)
 Physical Therapy Treatment Patient Details Name: Brandon Clark MRN: [id number] DOB: 1941-06-06 Today's Date: 05/08/2024   History of Present Illness Pt is 83 yo male presents to therapy s/p L TKA on 04/30/2024 due to failure of conservative measures. Pt hospitalization complicated by DVT on 10/30 and PE with R heart strain requiring O2 on 10/31 with pt transferred to stepdown care. Pt PMH includes but is not limited to: dementia, s/p Nissen fundoplication procedure, hernia, OSA, BPV, HTN, ataxia, DOE, arthritis, and B UE and LE pain.    PT Comments   The patient  really puts forth effort for mobility, able to  stand from recliner with max support, difficulty getting  left knee flexed  and WB on LLE. Once standing  at platform RW, able to step around to [facility].  Stood again at [employer], then bed brought up behind as patient becoming fatigued after sitting on BSC. Patient will benefit from intensive inpatient follow-up therapy, >3 hours/day    If plan is discharge home, recommend the following: Assistance with cooking/housework;Help with stairs or ramp for entrance;Two people to help with walking and/or transfers;Two people to help with bathing/dressing/bathroom   Can travel by private vehicle        Equipment Recommendations  None recommended by PT    Recommendations for Other Services Rehab consult     Precautions / Restrictions Precautions Precautions: Fall;Knee Precaution Booklet Issued: No Recall of Precautions/Restrictions: Intact Restrictions LLE Weight Bearing Per Provider Order: Weight bearing as tolerated     Mobility  Bed Mobility Overal bed mobility: Needs Assistance Bed Mobility:      Supine to sit: Mod assist Sit to supine: Mod assist   General bed mobility comments: Patient  able to assist LLE onto bed with mod support and belt.    Transfers Overall transfer level: Needs assistance Equipment used: Bilateral platform walker Transfers: Sit to/from  Stand, Bed to chair/wheelchair/BSC Sit to Stand: Max assist, +2 physical assistance, +2 safety/equipment   Step pivot transfers: Mod assist, +2 physical assistance, +2 safety/equipment       General transfer comment: max support to stand from  bed with  pillows elevating seat. max support of 2 for  3  trials to power up to get to standing  at platform RW , then able to step to  [facility]. Mod support of 2 to stand from [facility]. while standing, BSC removed and bed pulled up to patient.    Ambulation/Gait                   Stairs             Wheelchair Mobility     Tilt Bed    Modified Rankin (Stroke Patients Only)       Balance   Sitting-balance support: No upper extremity supported Sitting balance-Leahy Scale: Fair Sitting balance - Comments: UE support,, mod A , reports some disZziness when returns to supine   Standing balance support: Bilateral upper extremity supported, Reliant on assistive device for balance, During functional activity Standing balance-Leahy Scale: Poor Standing balance comment: RW and max, not straightening right knee                            Communication Communication Communication: No apparent difficulties Factors Affecting Communication: Non - English speaking, interpreter not available;Other (comment)  Cognition Arousal: Alert Behavior During Therapy: WFL for tasks assessed/performed, Anxious   PT - Cognitive impairments: No apparent  impairments                       PT - Cognition Comments: patient cheerful joking, is expressing concern that he sees blood in urine.RN explained to pt Following commands: Intact      Cueing Cueing Techniques: Verbal cues, Gestural cues  Exercises   General Comments        Pertinent Vitals/Pain Pain Assessment Faces Pain Scale: Hurts whole lot Pain Location: L Knee with standing Pain Descriptors / Indicators: Grimacing, Guarding, Crying Pain Intervention(s): Limited  activity within patient's tolerance, Monitored during session, Premedicated before session    Home Living                          Prior Function            PT Goals (current goals can now be found in the care plan section) Progress towards PT goals: Progressing toward goals    Frequency    7X/week      PT Plan      Co-evaluation              AM-PAC PT 6 Clicks Mobility   Outcome Measure  Help needed turning from your back to your side while in a flat bed without using bedrails?: A Little Help needed moving from lying on your back to sitting on the side of a flat bed without using bedrails?: A Lot Help needed moving to and from a bed to a chair (including a wheelchair)?: A Lot Help needed standing up from a chair using your arms (e.g., wheelchair or bedside chair)?: A Lot Help needed to walk in hospital room?: Total Help needed climbing 3-5 steps with a railing? : Total 6 Click Score: 11    End of Session Equipment Utilized During Treatment: Gait belt Activity Tolerance: Patient tolerated treatment well Patient left: in bed;with call bell/phone within reach;with bed alarm set Nurse Communication: Mobility status;Need for lift equipment PT Visit Diagnosis: Other abnormalities of gait and mobility (R26.89);Muscle weakness (generalized) (M62.81);Pain Pain - Right/Left: Left Pain - part of body: Knee     Time: 8884-8841 PT Time Calculation (min) (ACUTE ONLY): 43 min  Charges:    $Therapeutic Activity: 38-52 mins PT General Charges $$ ACUTE PT VISIT: 1 Visit                     [name] PT Acute Rehabilitation Services Office [phone]    Potters Darice Norris 05/08/2024, 2:37 PM

## 2024-05-08 NOTE — Progress Notes (Signed)
 Physical Therapy Treatment Patient Details Name: Brandon Clark MRN: [id number] DOB: Aug 24, 1940 Today's Date: 05/08/2024   History of Present Illness Pt is 83 yo male presents to therapy s/p L TKA on 04/30/2024 due to failure of conservative measures. Pt hospitalization complicated by DVT on 10/30 and PE with R heart strain requiring O2 on 10/31 with pt transferred to stepdown care. Pt PMH includes but is not limited to: dementia, s/p Nissen fundoplication procedure, hernia, OSA, BPV, HTN, ataxia, DOE, arthritis, and B UE and LE pain.    PT Comments   The patient is cheerful, interpreter [phone] utilized .  Patient able to self assist LLE to bed edge with less support from PT  and move to sitting. Mod support of 2 to stand at bilateral platform RW, from raised bed, slow to step to  recliner, very antalgic on the LLE.  The  patient able to perform  small AAROM to LLE when sitting in recliner. Patient  placed in recliner with LLE elevated and ice .  Continue  PT with progressive mobility as tolerated.  Patient will benefit from intensive inpatient follow-up therapy, >3 hours/day    If plan is discharge home, recommend the following: Assistance with cooking/housework;Help with stairs or ramp for entrance;Two people to help with walking and/or transfers;Two people to help with bathing/dressing/bathroom   Can travel by private vehicle        Equipment Recommendations  None recommended by PT    Recommendations for Other Services Rehab consult     Precautions / Restrictions Precautions Precautions: Fall;Knee Restrictions LLE Weight Bearing Per Provider Order: Weight bearing as tolerated     Mobility  Bed Mobility Overal bed mobility: Needs Assistance Bed Mobility: Supine to Sit     Supine to sit: Mod assist Sit to supine: Mod assist   General bed mobility comments: Patient able to use belt to  self assist the L leg toward  left bed edge, mod support to raise trunk,  patient able to scoot self to bed edge and place L foot to floor.    Transfers Overall transfer level: Needs assistance Equipment used: Bilateral platform walker Transfers: Sit to/from Stand, Bed to chair/wheelchair/BSC Sit to Stand: Mod assist, +2 safety/equipment, +2 physical assistance, From elevated surface   Step pivot transfers: Mod assist, +2 physical assistance, +2 safety/equipment       General transfer comment: Bil platform RW, MODA+2 from elevated bed height, Patient places max support  of UE's on the platform, slow to step  to recliner, antalgic on  LLE    Ambulation/Gait                   Stairs             Wheelchair Mobility     Tilt Bed    Modified Rankin (Stroke Patients Only)       Balance   Sitting-balance support: No upper extremity supported Sitting balance-Leahy Scale: Fair Sitting balance - Comments: UE support,, mod A , reports some disZziness   Standing balance support: Bilateral upper extremity supported, Reliant on assistive device for balance, During functional activity Standing balance-Leahy Scale: Poor Standing balance comment: RW and max, not straightening right knee                            Communication Communication Communication: No apparent difficulties Factors Affecting Communication: Non - English speaking, interpreter not available;Other (comment)  Cognition Arousal: Alert Behavior  During Therapy: WFL for tasks assessed/performed, Anxious   PT - Cognitive impairments: No apparent impairments                       PT - Cognition Comments: patient cheerful joking, is expressing concern that he sees blood in urine.RN explained to pt Following commands: Intact      Cueing Cueing Techniques: Verbal cues, Gestural cues  Exercises Total Joint Exercises Ankle Circles/Pumps: AROM, Both, 10 reps Quad Sets: AROM, Both, 5 reps Straight Leg Raises: AAROM, Left, 15 reps Long Arc Quad: AAROM,  Left, 10 reps Goniometric ROM: Lknee flex15-45    General Comments        Pertinent Vitals/Pain Pain Assessment Faces Pain Scale: Hurts whole lot Pain Location: L Knee with standing Pain Descriptors / Indicators: Grimacing, Guarding, Crying Pain Intervention(s): Limited activity within patient's tolerance, Monitored during session, Premedicated before session, Ice applied    Home Living                          Prior Function            PT Goals (current goals can now be found in the care plan section) Progress towards PT goals: Progressing toward goals    Frequency    7X/week      PT Plan      Co-evaluation              AM-PAC PT 6 Clicks Mobility   Outcome Measure  Help needed turning from your back to your side while in a flat bed without using bedrails?: A Little Help needed moving from lying on your back to sitting on the side of a flat bed without using bedrails?: A Lot Help needed moving to and from a bed to a chair (including a wheelchair)?: A Lot Help needed standing up from a chair using your arms (e.g., wheelchair or bedside chair)?: A Lot Help needed to walk in hospital room?: Total Help needed climbing 3-5 steps with a railing? : Total 6 Click Score: 11    End of Session Equipment Utilized During Treatment: Gait belt Activity Tolerance: Patient tolerated treatment well Patient left: with call bell/phone within reach;in chair Nurse Communication: Mobility status;Need for lift equipment PT Visit Diagnosis: Other abnormalities of gait and mobility (R26.89);Muscle weakness (generalized) (M62.81);Pain Pain - Right/Left: Left Pain - part of body: Knee     Time: 0921-0956 PT Time Calculation (min) (ACUTE ONLY): 35 min  Charges:    $Therapeutic Activity: 23-37 mins PT General Charges $$ ACUTE PT VISIT: 1 Visit                    [name] PT Acute Rehabilitation Services Office [phone]   Potters Darice Norris 05/08/2024, 2:20 PM

## 2024-05-08 NOTE — Progress Notes (Signed)
 Called in by [facility] RN to assess pt.   Pt complaining of 6/10 chest pain described by pt as a constant pressure and SOB. Vitals below. EKG obtained.   Nida, PA alerted. Morphine PRN administered. TRH consulted. Pt placed on continuous cardiac monitoring.     05/08/24 1639  Vitals  Temp 98.9 F (37.2 C)  Temp Source Oral  BP 121/88  MAP (mmHg) 99  BP Location Left Arm  BP Method Automatic  Patient Position (if appropriate) Lying  Pulse Rate 86  Pulse Rate Source Monitor  Resp 20  Level of Consciousness  Level of Consciousness Alert  MEWS COLOR  MEWS Score Color Green  Oxygen Therapy  SpO2 96 %  O2 Device Room Air  MEWS Score  MEWS Temp 0  MEWS Systolic 0  MEWS Pulse 0  MEWS RR 0  MEWS LOC 0  MEWS Score 0

## 2024-05-08 NOTE — Plan of Care (Signed)
   Problem: Health Behavior/Discharge Planning: Goal: Ability to manage health-related needs will improve Outcome: Progressing   Problem: Clinical Measurements: Goal: Ability to maintain clinical measurements within normal limits will improve Outcome: Progressing Goal: Will remain free from infection Outcome: Progressing

## 2024-05-08 NOTE — Plan of Care (Signed)
  Problem: Pain Managment: Goal: General experience of comfort will improve and/or be controlled Outcome: Progressing   Problem: Education: Goal: Knowledge of the prescribed therapeutic regimen will improve Outcome: Progressing

## 2024-05-08 NOTE — Progress Notes (Signed)
 PROGRESS NOTE    Brandon Clark  FMW:[id number] DOB: [birth year] DOA: 04/30/2024 PCP: Gladystine Erminio CROME, MD   Brief Narrative:   Re consulted for recurrent chest pain .   83 year old male with history of GERD/hiatal hernia/PUD, osteoarthritis, BPH, dementia, hyperlipidemia, hypertension, prediabetes, sleep apnea not on CPAP underwent left total knee replacement on 04/30/2024 by orthopedics.  TRH consulted on 05/03/2024 for dyspnea, dizziness and new oxygen requirement.  he was found to have left acute peroneal vein DVT on 05/02/2024 and was already started on Eliquis.  Because of dyspnea and oxygen requirement, he was supposed to be switched to heparin  drip but because of patient's faith/religion, he would not want to take pork products including heparin .  Eliquis was continued.  CTA chest showed acute PE with right heart strain.  Echocardiogram subsequently did not show any significant right heart strain. Patient was maintained on loading dose Eliquis and is still on it. TRH signed off 11/4.  Waiting to go to CIR. 11/5, patient started complaining of episodic severe left precordial chest pain.  Reconsulted.  Subjective: I was covering third floor, was paged to see the patient with complaining of chest pain.  Presented to the bedside.  Arabic interpreter was used through video interpretation. Patient was hemodynamically stable.  Patient describes left upper chest wall pain, stabbing sensation, worse with coughing and deep breathing ongoing for the last few days and worse today.  He also has ongoing cough with some mucoid sputum.  Patient otherwise without any events.  Left leg is greatly swollen and he is using some oral pain medications. Stat EKG done, reviewed without any ischemic changes. Chest x-ray ordered, will review.  Will recheck CBC and CMP. This is likely pleuritic chest pain.  Case discussed with surgery.  Will continue to follow-up.  Pain relieved with 1 mg of  morphine.  Assessment & plan of care:   Pleuritic chest pain secondary to acute PE with right heart strain Acute DVT of left peroneal veins Acute respiratory failure with hypoxia, resolved.  On room air now.  Suggested symptomatic management.  Patient is already on loading dose of Eliquis.  This was triggered pulmonary embolism after procedure.  Patient declined to be on heparin  products.  States this is likely pleuritic pain in a patient with known diagnosis of DVT PE and already therapeutic on treatment will treat symptomatically. - Respiratory status improving: Currently on room air.  Repeat chest x-ray today.  Continue to use incentive spirometry.  IV and oral opiates for pain relief today.  Chest physiotherapy and mobility to continue. -2D echo showed EF of 60 to 65% with moderate RV enlargement with preserved function.  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  Waiting for rehab.  Hypertension Hyperlipidemia - Blood pressure mostly stable.  Continue hydrochlorothiazide  - Continue statin  Sleep apnea - Continue CPAP  Probable dementia -continue memantine and donepezil   Diabetes mellitus type 2 -A1c 5.9.  Continue CBGs with SSI  BPH -continue finasteride   Obesity class II - Outpatient follow-up  Thank you for involving us  in this patient's care.  We will continue to see patient while he is admitted to the hospital given severity of medical illness.    Objective: Vitals:   05/08/24 0553 05/08/24 0957 05/08/24 1418 05/08/24 1639  BP: 108/72 124/82 117/72 121/88  Pulse: 83 91 82 86  Resp: 18 18 18 20   Temp: 98.3 F (36.8 C) 98.3 F (  36.8 C) 98.4 F (36.9 C) 98.9 F (37.2 C)  TempSrc: Oral Oral Oral Oral  SpO2: 95% 92% 95% 96%  Weight:      Height:        Intake/Output Summary (Last 24 hours) at 05/08/2024 1734 Last data filed at 05/08/2024 1400 Gross per 24 hour  Intake 930 ml   Output 700 ml  Net 230 ml   Filed Weights   04/30/24 1056 05/03/24 1900 05/03/24 2327  Weight: 120.7 kg 120.7 kg 122 kg    Examination:  General: Patient is in mild distress and slight anxiety and apprehension due to pain.  Once video interpreter was used, patient was very pleasant and interactive.  He is alert awake and oriented. respiratory: Patient has some conducted upper airway sounds.  He has reproducible pleuritic pain on deep breathing. CVS: Currently rate controlled; S1-S2 heard  abdominal: Soft, obese, nontender, distended mildly, no organomegaly; normal bowel sounds are heard  extremities: Edematous left leg, swelling of the left knee.    Data Reviewed: I have personally reviewed following labs and imaging studies  CBC: Recent Labs  Lab 05/01/24 1911 05/03/24 1027  WBC 10.0 7.6  HGB 14.5 13.9  HCT 46.2 42.9  MCV 90.2 90.3  PLT 174 158   Basic Metabolic Panel: Recent Labs  Lab 05/01/24 1911 05/03/24 1027  NA 138 138  K 4.2 4.6  CL 101 99  CO2 28 29  GLUCOSE 129* 127*  BUN 21 22  CREATININE 1.03 0.93  CALCIUM 8.8* 8.9  MG  --  2.2  PHOS  --  2.9   GFR: Estimated Creatinine Clearance: 84.1 mL/min (by C-G formula based on SCr of 0.93 mg/dL). Liver Function Tests: Recent Labs  Lab 05/01/24 1911 05/03/24 1027  AST 38 34  ALT 23 21  ALKPHOS 61 73  BILITOT 0.7 0.9  PROT 6.1* 6.2*  ALBUMIN 3.8 3.7   No results for input(s): LIPASE, AMYLASE in the last 168 hours. No results for input(s): AMMONIA in the last 168 hours. Coagulation Profile: No results for input(s): INR, PROTIME in the last 168 hours. Cardiac Enzymes: No results for input(s): CKTOTAL, CKMB, CKMBINDEX, TROPONINI in the last 168 hours. BNP (last 3 results) No results for input(s): PROBNP in the last 8760 hours. HbA1C: No results for input(s): HGBA1C in the last 72 hours.  CBG: Recent Labs  Lab 05/07/24 1935 05/07/24 2144 05/08/24 0727 05/08/24 1145  05/08/24 1628  GLUCAP 110* 136* 124* 123* 115*   Lipid Profile: No results for input(s): CHOL, HDL, LDLCALC, TRIG, CHOLHDL, LDLDIRECT in the last 72 hours. Thyroid  Function Tests: No results for input(s): TSH, T4TOTAL, FREET4, T3FREE, THYROIDAB in the last 72 hours. Anemia Panel: No results for input(s): VITAMINB12, FOLATE, FERRITIN, TIBC, IRON, RETICCTPCT in the last 72 hours. Sepsis Labs: No results for input(s): PROCALCITON, LATICACIDVEN in the last 168 hours.  Recent Results (from the past 240 hours)  MRSA Next Gen by PCR, Nasal     Status: None   Collection Time: 05/03/24 11:43 PM   Specimen: Nasal Mucosa; Nasal Swab  Result Value Ref Range Status   MRSA by PCR Next Gen NOT DETECTED NOT DETECTED Final    Comment: (NOTE) The GeneXpert MRSA Assay (FDA approved for NASAL specimens only), is one component of a comprehensive MRSA colonization surveillance program. It is not intended to diagnose MRSA infection nor to guide or monitor treatment for MRSA infections. Test performance is not FDA approved in patients less than 80 years old.  Performed at [facility], [address] [place], [place] [address]          Radiology Studies: No results found. Reviewed his echocardiogram, duplexes and CT angiogram from 10/31.      Scheduled Meds:  amitriptyline  25 mg Oral QHS   apixaban  10 mg Oral BID   Followed by   NOREEN ON 05/09/2024] apixaban  5 mg Oral BID   Chlorhexidine  Gluconate Cloth  6 each Topical Daily   docusate sodium  100 mg Oral BID   donepezil   10 mg Oral QHS   feeding supplement  1 Container Oral TID BM   feeding supplement  237 mL Oral BID BM   finasteride   5 mg Oral Daily   hydrochlorothiazide   12.5 mg Oral Daily   insulin aspart  0-15 Units Subcutaneous TID WC   insulin aspart  0-5 Units Subcutaneous QHS   memantine  10 mg Oral BID   simvastatin   10 mg Oral QHS   Continuous Infusions:   lactated ringers      lactated ringers      lactated ringers      lactated ringers  Stopped (05/01/24 1226)          Renato Applebaum, MD [facility] 05/08/2024, 5:34 PM

## 2024-05-09 LAB — GLUCOSE, CAPILLARY
Glucose-Capillary: 148 mg/dL — ABNORMAL HIGH (ref 70–99)
Glucose-Capillary: 152 mg/dL — ABNORMAL HIGH (ref 70–99)
Glucose-Capillary: 148 mg/dL — ABNORMAL HIGH (ref 70–99)
Glucose-Capillary: 168 mg/dL — ABNORMAL HIGH (ref 70–99)

## 2024-05-09 MED ORDER — OXYCODONE-ACETAMINOPHEN 5-325 MG PO TABS
1.0000 | ORAL_TABLET | Freq: Four times a day (QID) | ORAL | Status: DC | PRN
  Administered 2024-05-09 – 2024-05-12 (×9): 2 via ORAL
  Administered 2024-05-13: 1 via ORAL
  Filled 2024-05-09 (×11): qty 2

## 2024-05-09 MED ORDER — OXYCODONE-ACETAMINOPHEN 5-325 MG PO TABS: 1.0000 | ORAL_TABLET | Freq: Four times a day (QID) | ORAL | 0 refills | Status: DC | PRN

## 2024-05-09 MED ORDER — POLYETHYLENE GLYCOL 3350 17 G PO PACK
17.0000 g | PACK | Freq: Two times a day (BID) | ORAL | Status: DC
  Administered 2024-05-09 – 2024-05-13 (×9): 17 g via ORAL
  Filled 2024-05-09 (×9): qty 1

## 2024-05-09 NOTE — Progress Notes (Signed)
 Physical Therapy Treatment Patient Details Name: Brandon Clark MRN: [id number] DOB: 1941/01/18 Today's Date: 05/09/2024   History of Present Illness Pt is 83 yo male presents to therapy s/p L TKA on 04/30/2024 due to failure of conservative measures. Pt hospitalization complicated by DVT on 10/30 and PE with R heart strain requiring O2 on 10/31 with pt transferred to stepdown care. Pt PMH includes but is not limited to: dementia, s/p Nissen fundoplication procedure, hernia, OSA, BPV, HTN, ataxia, DOE, arthritis, and B UE and LE pain.    PT Comments   Assisted patient to stand fromBSC with +2 mod support on RW. Patient  attempted stepping backwards with decreased R knee support and decreased L  knee support .SABRA Assisted  patient to sit down to bed that was brought up closer. Patient continues to put forth maximum efforts, the  LLE pain and increased WB on LLE slowly improving. Left note to son to contact  AIR liaison. Patient stated that he would be sure son gets message.   If plan is discharge home, recommend the following: Assistance with cooking/housework;Help with stairs or ramp for entrance;Two people to help with walking and/or transfers;Two people to help with bathing/dressing/bathroom   Can travel by private vehicle        Equipment Recommendations  None recommended by PT    Recommendations for Other Services Rehab consult     Precautions / Restrictions Precautions Precautions: Knee;Fall Recall of Precautions/Restrictions: Intact Precaution/Restrictions Comments: very painful Left leg with WB attempts Restrictions Weight Bearing Restrictions Per Provider Order: Yes LLE Weight Bearing Per Provider Order: Weight bearing as tolerated     Mobility  Bed Mobility Overal bed mobility: Needs Assistance Bed Mobility: Sit to Supine     Supine to sit: Mod assist     General bed mobility comments: mod assist to place LLE onto bed, tolerated  like a SLR to lft the leg     Transfers Overall transfer level: Needs assistance Equipment used: Rolling walker (2 wheels) Transfers: Sit to/from Stand, Bed to chair/wheelchair/BSC Sit to Stand: +2 physical assistance, +2 safety/equipment, From elevated surface, Mod assist   Step pivot transfers: Mod assist, +2 physical assistance, +2 safety/equipment       General transfer comment: mod assist of 2 to stand at RW from [place], LLE tended to be too far ahead, Assisted to stand more erect and slide LLE back to provide some support. Right knee tending to  flex. patient took 1 hop step backwards but felt patient was unsteady, bed was brought up and patient  sat down safely.    Ambulation/Gait                   Stairs             Wheelchair Mobility     Tilt Bed    Modified Rankin (Stroke Patients Only)       Balance Overall balance assessment: Needs assistance Sitting-balance support: No upper extremity supported Sitting balance-Leahy Scale: Fair     Standing balance support: Bilateral upper extremity supported, Reliant on assistive device for balance, During functional activity Standing balance-Leahy Scale: Poor Standing balance comment: RW and mod, not straightening right knee, decreased WB                            Communication Communication Communication: No apparent difficulties Factors Affecting Communication: Non - English speaking, interpreter not available (interpreter used [id number])  Cognition Arousal:  Alert Behavior During Therapy: WFL for tasks assessed/performed   PT - Cognitive impairments: No apparent impairments                       PT - Cognition Comments: patient cheerful joking, is expressing concern that he has not had a BM, Interpreter [phone] used Following commands: Intact      Cueing Cueing Techniques: Verbal cues, Gestural cues  Exercises   General Comments        Pertinent Vitals/Pain Pain Assessment Faces Pain Scale: Hurts even  more Pain Location: L Knee with standing/WB Pain Descriptors / Indicators: Grimacing, Guarding, Crying Pain Intervention(s): Premedicated before session, Ice applied, Repositioned    Home Living                          Prior Function            PT Goals (current goals can now be found in the care plan section) Progress towards PT goals: Progressing toward goals    Frequency    7X/week      PT Plan      Co-evaluation              AM-PAC PT 6 Clicks Mobility   Outcome Measure  Help needed turning from your back to your side while in a flat bed without using bedrails?: A Little Help needed moving from lying on your back to sitting on the side of a flat bed without using bedrails?: A Little Help needed moving to and from a bed to a chair (including a wheelchair)?: A Lot Help needed standing up from a chair using your arms (e.g., wheelchair or bedside chair)?: A Lot Help needed to walk in hospital room?: Total Help needed climbing 3-5 steps with a railing? : Total 6 Click Score: 12    End of Session Equipment Utilized During Treatment: Gait belt Activity Tolerance: Patient tolerated treatment well Patient left: in bed;with call bell/phone within reach;with bed alarm set Nurse Communication: Mobility status PT Visit Diagnosis: Other abnormalities of gait and mobility (R26.89);Muscle weakness (generalized) (M62.81);Pain Pain - Right/Left: Left Pain - part of body: Knee     Time: 1415-1500 PT Time Calculation (min) (ACUTE ONLY): 45 min  Charges:    $Therapeutic Activity: 23-37 mins $Self Care/Home Management: 8-22 PT General Charges $$ ACUTE PT VISIT: 1 Visit                     [name] PT Acute Rehabilitation Services Office [phone]    Potters Darice Norris 05/09/2024, 3:49 PM

## 2024-05-09 NOTE — Progress Notes (Signed)
 Subjective: 9 Days Post-Op Procedure(s) (LRB): ARTHROPLASTY, KNEE, TOTAL (Left) Patients  chest pain is now gone and he is feeling better. He is wondering about a different pain medication to help with his knee.   Activity level:  wbat Diet tolerance:  ok Voiding:  ok Patient reports pain as moderate.    Objective: Vital signs in last 24 hours: Temp:  [98.4 F (36.9 C)-99.1 F (37.3 C)] 99.1 F (37.3 C) (11/06 0508) Pulse Rate:  [82-94] 88 (11/06 0508) Resp:  [18-20] 18 (11/06 0508) BP: (114-121)/(72-92) 121/92 (11/06 0508) SpO2:  [95 %-96 %] 96 % (11/06 0508)  Labs: Recent Labs    05/08/24 1805  HGB 10.8*   Recent Labs    05/08/24 1805  WBC 8.4  RBC 3.69*  HCT 33.3*  PLT 281   Recent Labs    05/08/24 1805  NA 134*  K 4.2  CL 96*  CO2 29  BUN 17  CREATININE 0.83  GLUCOSE 131*  CALCIUM 9.1   No results for input(s): LABPT, INR in the last 72 hours.  Physical Exam:  Neurologically intact ABD soft Neurovascular intact Sensation intact distally Intact pulses distally Dorsiflexion/Plantar flexion intact Incision: dressing C/D/I and no drainage No cellulitis present Compartment soft  Assessment/Plan:  9 Days Post-Op Procedure(s) (LRB): ARTHROPLASTY, KNEE, TOTAL (Left) Advance diet Up with therapy Discharge to CIR of SNF once approved. I greatly appreciate medical input and management.   I will switch him to oxycodone  to see if that helps better with his pain.    Prentice Mt Stefannie Defeo 05/09/2024, 12:30 PM

## 2024-05-09 NOTE — Progress Notes (Signed)
 Inpatient Rehab Admissions Coordinator:   Still no return call from son.  I've left another message.    Reche Lowers, PT, DPT Admissions Coordinator [phone] 05/09/24 12:59 PM

## 2024-05-09 NOTE — TOC Progression Note (Signed)
 Transition of Care [facility]) - Progression Note    Patient Details  Name: Brandon Clark MRN: [id number] Date of Birth: 1941/03/29  [facility] of [facility] CM/SW Contact  NORMAN ASPEN, LCSW Phone Number: 05/09/2024, 11:07 AM  Clinical Narrative:     Noted pt being considered for CIR and Workers Comp CM aware.  IP CM can follow along in case any change in original dc plans.   Initial plan was for pt to have OPPT (was already arranged), however, developed medical issues and hospitalization prolonged with pt actually declining in function overall.  If not accepted by CIR then may need to consider SNF.      Barriers to Discharge: No Barriers Identified               Expected Discharge Plan and Services         Expected Discharge Date: 05/01/24               DME Arranged: N/A DME Agency: NA                   Social Drivers of Health (SDOH) Interventions SDOH Screenings   Food Insecurity: Unknown (04/30/2024)  Housing: Unknown (04/30/2024)  Transportation Needs: Unknown (04/30/2024)  Utilities: Patient Declined (04/30/2024)  Depression (PHQ2-9): Low Risk  (10/16/2019)  Financial Resource Strain: Low Risk  (10/27/2023)   Received from [facility]  Physical Activity: Unknown (10/27/2023)   Received from [facility]  Social Connections: Patient Declined (04/30/2024)  Stress: No Stress Concern Present (10/27/2023)   Received from [facility]  Tobacco Use: Low Risk  (04/30/2024)    Readmission Risk Interventions     No data to display

## 2024-05-09 NOTE — Progress Notes (Addendum)
 Physical Therapy Treatment Patient Details Name: Brandon Clark MRN: [id number] DOB: 10-16-1940 Today's Date: 05/09/2024   History of Present Illness Pt is 83 yo male presents to therapy s/p L TKA on 04/30/2024 due to failure of conservative measures. Pt hospitalization complicated by DVT on 10/30 and PE with R heart strain requiring O2 on 10/31 with pt transferred to stepdown care. Pt PMH includes but is not limited to: dementia, s/p Nissen fundoplication procedure, hernia, OSA, BPV, HTN, ataxia, DOE, arthritis, and B UE and LE pain.    PT Comments   The patient is eager to get up  and stand, using Platform RW and Mod support of 2.  Patient continues to be antalgic on the LLE. Patient   does put forth much effort to progress with increasing WB on  the LLE. Patient will benefit from intensive inpatient follow-up therapy, >3 hours/day      If plan is discharge home, recommend the following: Assistance with cooking/housework;Help with stairs or ramp for entrance;Two people to help with walking and/or transfers;Two people to help with bathing/dressing/bathroom   Can travel by private vehicle        Equipment Recommendations  None recommended by PT    Recommendations for Other Services Rehab consult     Precautions / Restrictions Precautions Precautions: fall, knee Recall of Precautions/Restrictions: Intact Precaution/Restrictions Comments: very painful Left leg Restrictions LLE Weight Bearing Per Provider Order: Weight bearing as tolerated     Mobility  Bed Mobility Overal bed mobility: Needs Assistance Bed Mobility: Supine to Sit     Supine to sit: Mod assist     General bed mobility comments: Patient  able to assist LLE with mod assist to support the LLE,.    Transfers Overall transfer level: Needs assistance Equipment used: Bilateral platform walker Transfers: Sit to/from Stand, Bed to chair/wheelchair/BSC Sit to Stand: +2 physical assistance, +2  safety/equipment, Mod assist, From elevated surface   Step pivot transfers: Mod assist, +2 physical assistance, +2 safety/equipment       General transfer comment: patient able to power to stand with bed raised , stands at platform RW. Cues to  try to get    the LLE back under before  stepping. Patient slowly steps to recliner with  TDWB on the left.    Ambulation/Gait                   Stairs             Wheelchair Mobility     Tilt Bed    Modified Rankin (Stroke Patients Only)       Balance Overall balance assessment: Needs assistance Sitting-balance support: No upper extremity supported Sitting balance-Leahy Scale: Fair     Standing balance support: Bilateral upper extremity supported, Reliant on assistive device for balance, During functional activity Standing balance-Leahy Scale: Poor Standing balance comment: RW and mod, not straightening right knee, decreased WB                            Communication Communication Communication: No apparent difficulties Factors Affecting Communication: Non - English speaking, interpreter not available  Cognition Arousal: Alert Behavior During Therapy: WFL for tasks assessed/performed   PT - Cognitive impairments: No apparent impairments                       PT - Cognition Comments: patient cheerful joking, is expressing concern that he has not  had a BM, Interpreter [phone] used Following commands: Intact      Cueing Cueing Techniques: Verbal cues, Gestural cues  Exercises Total Joint Exercises Ankle Circles/Pumps: AROM, Both, 10 reps Quad Sets: AROM, Both, 10 reps Straight Leg Raises: AAROM, Left, 10 reps Long Arc Quad: AAROM, Left, 15 reps Other Exercises Other Exercises: Internal/external of both legs x 10    General Comments        Pertinent Vitals/Pain Pain Assessment Faces Pain Scale: Hurts even more Pain Location: L Knee with standing Pain Descriptors / Indicators:  Grimacing, Guarding, Crying Pain Intervention(s): Premedicated before session, Monitored during session, Ice applied    Home Living                          Prior Function            PT Goals (current goals can now be found in the care plan section) Progress towards PT goals: Progressing toward goals    Frequency           PT Plan      Co-evaluation              AM-PAC PT 6 Clicks Mobility   Outcome Measure  Help needed turning from your back to your side while in a flat bed without using bedrails?: A Little Help needed moving from lying on your back to sitting on the side of a flat bed without using bedrails?: A Lot Help needed moving to and from a bed to a chair (including a wheelchair)?: A Lot Help needed standing up from a chair using your arms (e.g., wheelchair or bedside chair)?: A Lot Help needed to walk in hospital room?: Total Help needed climbing 3-5 steps with a railing? : Total 6 Click Score: 11    End of Session Equipment Utilized During Treatment: Gait belt Activity Tolerance: Patient tolerated treatment well Patient left: in chair;with call bell/phone within reach Nurse Communication: Mobility status PT Visit Diagnosis: Other abnormalities of gait and mobility (R26.89);Muscle weakness (generalized) (M62.81);Pain Pain - Right/Left: Left Pain - part of body: Knee     Time: 8945-8876 PT Time Calculation (min) (ACUTE ONLY): 29 min  Charges:    $Therapeutic Activity: 23-37 mins PT General Charges $$ ACUTE PT VISIT: 1 Visit                    [name] PT Acute Rehabilitation Services Office [phone]  Potters Darice Norris 05/09/2024, 3:22 PM

## 2024-05-09 NOTE — Progress Notes (Signed)
 PROGRESS NOTE    Brandon Clark  FMW:[id number] DOB: 27-Dec-1940 DOA: 04/30/2024 PCP: Gladystine Erminio CROME, MD   Brief Narrative:   83 year old male with history of GERD/hiatal hernia/PUD, osteoarthritis, BPH, dementia, hyperlipidemia, hypertension, prediabetes, sleep apnea not on CPAP underwent left total knee replacement on 04/30/2024 by orthopedics.  TRH consulted on 05/03/2024 for dyspnea, dizziness and new oxygen requirement.  he was found to have left acute peroneal vein DVT on 05/02/2024 and was already started on Eliquis.  Because of dyspnea and oxygen requirement, he was supposed to be switched to heparin  drip but because of patient's faith/religion, he would not want to take pork products including heparin .  Eliquis was continued.  CTA chest showed acute PE with right heart strain.  Echocardiogram subsequently did not show any significant right heart strain. Patient was maintained on loading dose Eliquis and is still on it. TRH signed off 11/4.  Waiting to go to CIR. 11/5, patient started complaining of episodic severe left precordial chest pain.  Reconsulted.  11/6, adequately stabilized.  Medically stable for discharge to rehab.  Subjective: Patient seen and examined.  Denies any recurrence of chest pain.  Patient tells me that he cannot walk on his left leg.  He was very appreciative of x-rays done and checked for his lungs.  No other overnight events.  Telemetry monitor with sinus rhythm.  Assessment & plan of care:   Pleuritic chest pain secondary to acute PE with right heart strain Acute DVT of left peroneal veins Acute respiratory failure with hypoxia, resolved.  On room air now.  Clinically stabilizing.  Symptomatic management.  Adequate pain medications.  Adequately anticoagulated with Eliquis.   DVT PE precipitated due to chronic knee pain, will need 3 to 6 months of anticoagulation therapy.   Respiratory status improving: Currently on room air.  Repeat chest  x-ray without any acute findings.  Continue to use incentive spirometry.  Adequate pain relief.  Increase mobility and out of bed.  2D echo showed EF of 60 to 65% with moderate RV enlargement with preserved function.  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  Waiting for rehab.  Hypertension Hyperlipidemia - Blood pressure mostly stable.  Continue hydrochlorothiazide  - Continue statin  Sleep apnea - Continue CPAP  Probable dementia -continue memantine and donepezil   Diabetes mellitus type 2 -A1c 5.9.  Continue CBGs with SSI  BPH -continue finasteride   Obesity class II - Outpatient follow-up  Thank you for involving us  in this patient's care.  We will continue to see patient while he is admitted to the hospital given severity of medical illness.    Objective: Vitals:   05/08/24 1418 05/08/24 1639 05/08/24 2017 05/09/24 0508  BP: 117/72 121/88 114/75 (!) 121/92  Pulse: 82 86 94 88  Resp: 18 20 20 18   Temp: 98.4 F (36.9 C) 98.9 F (37.2 C) 99.1 F (37.3 C) 99.1 F (37.3 C)  TempSrc: Oral Oral Oral Oral  SpO2: 95% 96% 95% 96%  Weight:      Height:        Intake/Output Summary (Last 24 hours) at 05/09/2024 0940 Last data filed at 05/08/2024 1800 Gross per 24 hour  Intake 480 ml  Output 500 ml  Net -20 ml   Filed Weights   04/30/24 1056 05/03/24 1900 05/03/24 2327  Weight: 120.7 kg 120.7 kg 122 kg    Examination:  General: Pleasant and interactive.  On room air.  respiratory: Patient has some conducted upper airway sounds.  No chest pain today. CVS: Currently rate controlled; S1-S2 heard  abdominal: Soft, obese, nontender, distended mildly, no organomegaly; normal bowel sounds are heard  extremities: Significantly edematous left leg, swelling and ecchymosis of the left knee.    Data Reviewed: I have personally reviewed following labs and imaging studies  CBC: Recent  Labs  Lab 05/03/24 1027 05/08/24 1805  WBC 7.6 8.4  NEUTROABS  --  4.5  HGB 13.9 10.8*  HCT 42.9 33.3*  MCV 90.3 90.2  PLT 158 281   Basic Metabolic Panel: Recent Labs  Lab 05/03/24 1027 05/08/24 1805  NA 138 134*  K 4.6 4.2  CL 99 96*  CO2 29 29  GLUCOSE 127* 131*  BUN 22 17  CREATININE 0.93 0.83  CALCIUM 8.9 9.1  MG 2.2  --   PHOS 2.9  --    GFR: Estimated Creatinine Clearance: 94.2 mL/min (by C-G formula based on SCr of 0.83 mg/dL). Liver Function Tests: Recent Labs  Lab 05/03/24 1027 05/08/24 1805  AST 34 57*  ALT 21 62*  ALKPHOS 73 177*  BILITOT 0.9 1.3*  PROT 6.2* 6.3*  ALBUMIN 3.7 3.2*   No results for input(s): LIPASE, AMYLASE in the last 168 hours. No results for input(s): AMMONIA in the last 168 hours. Coagulation Profile: No results for input(s): INR, PROTIME in the last 168 hours. Cardiac Enzymes: No results for input(s): CKTOTAL, CKMB, CKMBINDEX, TROPONINI in the last 168 hours. BNP (last 3 results) No results for input(s): PROBNP in the last 8760 hours. HbA1C: No results for input(s): HGBA1C in the last 72 hours.  CBG: Recent Labs  Lab 05/08/24 0727 05/08/24 1145 05/08/24 1628 05/08/24 2054 05/09/24 0734  GLUCAP 124* 123* 115* 169* 148*   Lipid Profile: No results for input(s): CHOL, HDL, LDLCALC, TRIG, CHOLHDL, LDLDIRECT in the last 72 hours. Thyroid  Function Tests: No results for input(s): TSH, T4TOTAL, FREET4, T3FREE, THYROIDAB in the last 72 hours. Anemia Panel: No results for input(s): VITAMINB12, FOLATE, FERRITIN, TIBC, IRON, RETICCTPCT in the last 72 hours. Sepsis Labs: No results for input(s): PROCALCITON, LATICACIDVEN in the last 168 hours.  Recent Results (from the past 240 hours)  MRSA Next Gen by PCR, Nasal     Status: None   Collection Time: 05/03/24 11:43 PM   Specimen: Nasal Mucosa; Nasal Swab  Result Value Ref Range Status   MRSA by PCR Next Gen NOT  DETECTED NOT DETECTED Final    Comment: (NOTE) The GeneXpert MRSA Assay (FDA approved for NASAL specimens only), is one component of a comprehensive MRSA colonization surveillance program. It is not intended to diagnose MRSA infection nor to guide or monitor treatment for MRSA infections. Test performance is not FDA approved in patients less than 55 years old. Performed at [facility], [address] [place], [place] [address]          Radiology Studies: DG CHEST PORT 1 VIEW Result Date: 05/08/2024 CLINICAL DATA:  Shortness of breath. EXAM: PORTABLE CHEST 1 VIEW COMPARISON:  Chest radiograph dated 06/09/2021. FINDINGS: No focal consolidation, pleural effusion or pneumothorax. Stable cardiac silhouette. No acute osseous pathology. IMPRESSION: No active disease. Electronically Signed   By: Vanetta Chou M.D.   On: 05/08/2024 20:56   Reviewed his echocardiogram, duplexes and CT angiogram from 10/31.      Scheduled Meds:  amitriptyline  25 mg Oral QHS   apixaban  5 mg Oral BID   Chlorhexidine  Gluconate Cloth  6 each Topical Daily   docusate sodium  100 mg Oral BID   donepezil   10 mg Oral QHS   feeding supplement  1 Container Oral TID BM   feeding supplement  237 mL Oral BID BM   finasteride   5 mg Oral Daily   hydrochlorothiazide   12.5 mg Oral Daily   insulin aspart  0-15 Units Subcutaneous TID WC   insulin aspart  0-5 Units Subcutaneous QHS   memantine  10 mg Oral BID   simvastatin   10 mg Oral QHS   Continuous Infusions:  lactated ringers      lactated ringers      lactated ringers      lactated ringers  Stopped (05/01/24 1226)          Renato Applebaum, MD [facility] 05/09/2024, 9:40 AM

## 2024-05-09 NOTE — Progress Notes (Signed)
 Physical Therapy Treatment Patient Details Name: Brandon Clark MRN: [id number] DOB: 04/07/1941 Today's Date: 05/09/2024   History of Present Illness Pt is 83 yo male presents to therapy s/p L TKA on 04/30/2024 due to failure of conservative measures. Pt hospitalization complicated by DVT on 10/30 and PE with R heart strain requiring O2 on 10/31 with pt transferred to stepdown care. Pt PMH includes but is not limited to: dementia, s/p Nissen fundoplication procedure, hernia, OSA, BPV, HTN, ataxia, DOE, arthritis, and B UE and LE pain.    PT Comments   +2 mod support to stand from recliner and step to [facility] using the RW, patient able to lower self to  [facility] seat, tolerated increased L knee flexion. Patient left on [facility]  with call bell. Patient did  well with UE support on RW for step pivot. Will assist back to bed after toileting.    If plan is discharge home, recommend the following: Assistance with cooking/housework;Help with stairs or ramp for entrance;Two people to help with walking and/or transfers;Two people to help with bathing/dressing/bathroom   Can travel by private vehicle        Equipment Recommendations  None recommended by PT    Recommendations for Other Services Rehab consult     Precautions / Restrictions Precautions Precautions: None;Knee;Fall Recall of Precautions/Restrictions: Intact Precaution/Restrictions Comments: very painful Left leg with WB attempts Restrictions Weight Bearing Restrictions Per Provider Order: Yes LLE Weight Bearing Per Provider Order: Weight bearing as tolerated     Mobility  Bed Mobility   General bed mobility comments: in recliner    Transfers Overall transfer level: Needs assistance Equipment used: Rolling walker (2 wheels) Transfers: Sit to/from Stand, Bed to chair/wheelchair/BSC Sit to Stand: +2 physical assistance, +2 safety/equipment, From elevated surface, Mod assist   Step pivot transfers: Mod assist, +2 physical  assistance, +2 safety/equipment       General transfer comment: cues to push from recliner on 1 Arm, mod assist + 2 to stand from elevated recliner seat, patient able to stand at [facility] and step to [facility],  cues to reach back to   sit to [facility].    Ambulation/Gait                   Stairs             Wheelchair Mobility     Tilt Bed    Modified Rankin (Stroke Patients Only)       Balance Overall balance assessment: Needs assistance Sitting-balance support: No upper extremity supported Sitting balance-Leahy Scale: Fair     Standing balance support: Bilateral upper extremity supported, Reliant on assistive device for balance, During functional activity Standing balance-Leahy Scale: Poor Standing balance comment: RW and mod, not straightening right knee, decreased WB                            Communication Communication Communication: No apparent difficulties Factors Affecting Communication: Non - English speaking, interpreter not available  Cognition Arousal: Alert Behavior During Therapy: WFL for tasks assessed/performed   PT - Cognitive impairments: No apparent impairments                       PT - Cognition Comments: patient cheerful joking, is expressing concern that he has not had a BM, Interpreter [id number] used Following commands: Intact      Cueing Cueing Techniques: Verbal cues, Gestural cues  Exercises   General  Comments        Pertinent Vitals/Pain Pain Assessment Faces Pain Scale: Hurts even more Pain Location: L Knee with standing/WB Pain Descriptors / Indicators: Grimacing, Guarding, Crying Pain Intervention(s): Repositioned    Home Living                          Prior Function            PT Goals (current goals can now be found in the care plan section) Progress towards PT goals: Progressing toward goals    Frequency    7X/week      PT Plan      Co-evaluation              AM-PAC PT  6 Clicks Mobility   Outcome Measure  Help needed turning from your back to your side while in a flat bed without using bedrails?: A Little Help needed moving from lying on your back to sitting on the side of a flat bed without using bedrails?: A Lot Help needed moving to and from a bed to a chair (including a wheelchair)?: A Lot Help needed standing up from a chair using your arms (e.g., wheelchair or bedside chair)?: A Lot Help needed to walk in hospital room?: Total Help needed climbing 3-5 steps with a railing? : Total 6 Click Score: 11    End of Session Equipment Utilized During Treatment: Gait belt Activity Tolerance: Patient tolerated treatment well Patient left:  (on BSC with call bell.) Nurse Communication: Mobility status PT Visit Diagnosis: Other abnormalities of gait and mobility (R26.89);Muscle weakness (generalized) (M62.81);Pain Pain - Right/Left: Left Pain - part of body: Knee     Time: 1335-1400 PT Time Calculation (min) (ACUTE ONLY): 25 min  Charges:    $Therapeutic Activity: 23-37 mins PT General Charges $$ ACUTE PT VISIT: 1 Visit                    [name] PT Acute Rehabilitation Services Office [phone]    Potters Darice Norris 05/09/2024, 3:31 PM

## 2024-05-10 LAB — GLUCOSE, CAPILLARY
Glucose-Capillary: 123 mg/dL — ABNORMAL HIGH (ref 70–99)
Glucose-Capillary: 141 mg/dL — ABNORMAL HIGH (ref 70–99)
Glucose-Capillary: 108 mg/dL — ABNORMAL HIGH (ref 70–99)
Glucose-Capillary: 136 mg/dL — ABNORMAL HIGH (ref 70–99)

## 2024-05-10 NOTE — Progress Notes (Signed)
 Inpatient Rehab Admissions Coordinator:   I spoke to pt's granddaughter, [name] and reviewed rehab recommendations and goals/expectations of CIR stay.  Family is in support of pursuing CIR and can provide 24/7 supervision at discharge.  They have first floor set up with bed/bath that pt can use if needed.  I reviewed worker's comp and I will contact their case manager today to get approval.  It is highly unlikely that I will be able to move him over the weekend due to no beds over the weekend.   Reche Lowers, PT, DPT Admissions Coordinator [phone] 05/10/24 10:44 AM

## 2024-05-10 NOTE — Plan of Care (Signed)
  Problem: Clinical Measurements: Goal: Will remain free from infection 05/10/2024 0744 by Roann Field D, RN Outcome: Progressing 05/10/2024 0743 by Roann Field D, RN Outcome: Progressing Goal: Diagnostic test results will improve Outcome: Progressing

## 2024-05-10 NOTE — Plan of Care (Signed)
  Problem: Clinical Measurements: Goal: Will remain free from infection 05/10/2024 0744 by Roann Field D, RN Outcome: Progressing 05/10/2024 0744 by Roann Field D, RN Outcome: Progressing 05/10/2024 0743 by Roann Field D, RN Outcome: Progressing Goal: Diagnostic test results will improve 05/10/2024 0744 by Roann Field D, RN Outcome: Progressing 05/10/2024 0744 by Roann Field BIRCH, RN Outcome: Progressing

## 2024-05-10 NOTE — Progress Notes (Signed)
 Inpatient Rehab Admissions Coordinator:   Returned son's phone call this morning.  Left a voicemail.  Will follow.   Reche Lowers, PT, DPT Admissions Coordinator [phone] 05/10/24 10:15 AM

## 2024-05-10 NOTE — TOC Progression Note (Signed)
 Transition of Care [facility]) - Progression Note    Patient Details  Name: Brandon Clark MRN: [id number] Date of Birth: 28-Apr-1941  Transition of [facility] CM/SW Contact  Sonda Manuella Quill, RN Phone Number: 05/10/2024, 11:59 AM  Clinical Narrative:    Reche Lowers, CIR Admissions to contact Worker's Comp CM to get approval for CIR; IP CM is following.      Barriers to Discharge: No Barriers Identified               Expected Discharge Plan and Services         Expected Discharge Date: 05/01/24               DME Arranged: N/A DME Agency: NA                   Social Drivers of Health (SDOH) Interventions SDOH Screenings   Food Insecurity: Unknown (04/30/2024)  Housing: Unknown (04/30/2024)  Transportation Needs: Unknown (04/30/2024)  Utilities: Patient Declined (04/30/2024)  Depression (PHQ2-9): Low Risk  (10/16/2019)  Financial Resource Strain: Low Risk  (10/27/2023)   Received from [facility]  Physical Activity: Unknown (10/27/2023)   Received from [facility]  Social Connections: Patient Declined (04/30/2024)  Stress: No Stress Concern Present (10/27/2023)   Received from [facility]  Tobacco Use: Low Risk  (04/30/2024)    Readmission Risk Interventions     No data to display

## 2024-05-10 NOTE — PMR Pre-admission (Signed)
 PMR Admission Coordinator Pre-Admission Assessment  Patient: Brandon Clark is an 83 y.o., male MRN: [id number] DOB: [birth year] Height: 6' (182.9 cm) Weight: 122 kg  Insurance Information HMO:     PPO:      PCP:      IPA:      80/20:      OTHER:  PRIMARY: Worker's Comp      Policy#:       Subscriber:  CM Name: Brandon Clark      Phone#: [phone]     Fax#:  Pre-Cert#:       Employer:  Benefits:  Phone #:      Name:  Eff. Date:      Deduct:       Out of Pocket Max:       Life Max:  CIR:       SNF:  Outpatient:      Co-Pay:  Home Health:       Co-Pay:  DME:      Co-Pay:  Providers:  SECONDARY:       Policy#:      Phone#:   Brandon Clark:       Phone#:   The Data Processing Manager" for patients in Inpatient Rehabilitation Facilities with attached "Privacy Act Statement-Health Care Records" was provided and verbally reviewed with: Patient and Family  Emergency Contact Information Contact Information     Name Relation Home Work Mobile   Brandon Clark   [phone]   Brandon Clark [phone]  [phone]   Brandon Clark Daughter   [phone]      Other Contacts   None on File     Current Medical History  Patient Admitting Diagnosis: L TKA c/b PE with heart strain  History of Present Illness: Pt is an 83 y/o male with PMH of GERD, PUD, osteoarthritis, BPA, dementia, HLD, HTN, prediabetes, OSA not on CPAP who was admitted to [facility] on 04/30/24 for a planned TKA on the L after failed management of a L knee injury from a fall at work.  He underwent TKA per Dr. Dalldorf on 10/28 and is WBAT.  Post operatively he developed dyspnea, dizziness, and new O2 requirement.  Hospitalist was consulted and workup revealed acute left peroneal vein DVT on 10/30 and was started on eliquis.  CTA chest showed acute PE with right heart strain and eliquis was continued.  TRH had initially signed off but on 11/5 pt developed sudden onset chest  pain which was pressure like in nature and 6/10 in intensity so they were reconsulted.  Workup was negative for acute process.  Therapy ongoing and pt was recommended for CIR.     Patient's medical record from Brandon Clark has been reviewed by the rehabilitation admission coordinator and physician.  Past Medical History  Past Medical History:  Diagnosis Date   Acid reflux    Arthritis    BPH (benign prostatic hyperplasia)    BPPV (benign paroxysmal positional vertigo)    Dementia (HCC)    Hiatal hernia    Hyperlipidemia    Hypertension    Pre-diabetes    Sleep apnea    sometimes does not use d/t poor fitting of the mask   Ulcer     Has the patient had major surgery during 100 days prior to admission? Yes  Family History   family history includes Diabetes in his brother and mother.  Current Medications  Current Facility-Administered Medications:    acetaminophen  (TYLENOL ) tablet 325-650 mg, 325-650 mg, Oral,  Q6H PRN, Nida, Amela Handley, PA-C   alum & mag hydroxide-simeth (MAALOX/MYLANTA) 200-200-20 MG/5ML suspension 30 mL, 30 mL, Oral, Q4H PRN, Nida, Lasheika Ortloff, PA-C, 30 mL at 05/02/24 1333   amitriptyline (ELAVIL) tablet 25 mg, 25 mg, Oral, QHS, Nida, Anselmo Reihl, PA-C, 25 mg at 05/09/24 2149   [COMPLETED] apixaban (ELIQUIS) tablet 10 mg, 10 mg, Oral, BID, 10 mg at 05/08/24 2116 **FOLLOWED BY** apixaban (ELIQUIS) tablet 5 mg, 5 mg, Oral, BID, Chavez, Abigail, NP, 5 mg at 05/10/24 1054   bisacodyl  (DULCOLAX) EC tablet 5 mg, 5 mg, Oral, Daily PRN, Nida, Concettina Leth, PA-C, 5 mg at 05/07/24 1041   Chlorhexidine  Gluconate Cloth 2 % PADS 6 each, 6 each, Topical, Daily, Dalldorf, Peter, MD, 6 each at 05/10/24 1055   diphenhydrAMINE  (BENADRYL ) 12.5 MG/5ML elixir 12.5-25 mg, 12.5-25 mg, Oral, Q4H PRN, Nida, Jarreau Callanan, PA-C   docusate sodium (COLACE) capsule 100 mg, 100 mg, Oral, BID, Nida, Vega Withrow, PA-C, 100 mg at 05/10/24 1054   donepezil  (ARICEPT ) tablet 10 mg, 10 mg, Oral, QHS, Nida, Ethyl Vila, PA-C, 10 mg at  05/09/24 2150   feeding supplement (BOOST / RESOURCE BREEZE) liquid 1 Container, 1 Container, Oral, TID BM, Ortiz, David Manuel, MD, 1 Container at 05/10/24 1055   feeding supplement (ENSURE SURGERY) liquid 237 mL, 237 mL, Oral, BID BM, Nida, Tauno Falotico, PA-C, 237 mL at 05/10/24 1055   finasteride  (PROSCAR ) tablet 5 mg, 5 mg, Oral, Daily, Nida, Ian Cavey, PA-C, 5 mg at 05/10/24 1053   hydrochlorothiazide  (HYDRODIURIL ) tablet 12.5 mg, 12.5 mg, Oral, Daily, Nida, Meilyn Heindl, PA-C, 12.5 mg at 05/10/24 1053   insulin aspart (novoLOG) injection 0-15 Units, 0-15 Units, Subcutaneous, TID WC, Daniels, James K, NP, 2 Units at 05/10/24 1249   insulin aspart (novoLOG) injection 0-5 Units, 0-5 Units, Subcutaneous, QHS, Daniels, James K, NP   lactated ringers  bolus 250 mL, 250 mL, Intravenous, Once, Lenis Barter, PA-C   lactated ringers  bolus 250 mL, 250 mL, Intravenous, Once, Lenis Barter, PA-C   lactated ringers  bolus 500 mL, 500 mL, Intravenous, Once, Lenis Barter, PA-C   lactated ringers  infusion, , Intravenous, Continuous, Nida, Tycho Cheramie, PA-C, Stopped at 05/01/24 1226   memantine (NAMENDA) tablet 10 mg, 10 mg, Oral, BID, Nida, Hyden Soley, PA-C, 10 mg at 05/10/24 1054   menthol (CEPACOL) lozenge 3 mg, 1 lozenge, Oral, PRN **OR** phenol (CHLORASEPTIC) mouth spray 1 spray, 1 spray, Mouth/Throat, PRN, Nida, Izen Petz, PA-C   methocarbamol  (ROBAXIN ) tablet 750 mg, 750 mg, Oral, Q6H PRN, 750 mg at 05/10/24 1053 **OR** methocarbamol  (ROBAXIN ) injection 500 mg, 500 mg, Intravenous, Q6H PRN, Nida, Lalia Loudon, PA-C, 500 mg at 05/06/24 0057   metoCLOPramide (REGLAN) tablet 5-10 mg, 5-10 mg, Oral, Q8H PRN **OR** metoCLOPramide (REGLAN) injection 5-10 mg, 5-10 mg, Intravenous, Q8H PRN, Nida, Elmo Rio, PA-C, 10 mg at 05/06/24 1643   morphine (PF) 2 MG/ML injection 0.5-1 mg, 0.5-1 mg, Intravenous, Q2H PRN, Nida, Emma Birchler, PA-C, 1 mg at 05/08/24 1720   oxyCODONE -acetaminophen  (PERCOCET/ROXICET) 5-325 MG per tablet 1-2 tablet, 1-2 tablet, Oral, Q6H  PRN, Nida, Edelmiro Innocent, PA-C, 2 tablet at 05/10/24 1054   polyethylene glycol (MIRALAX / GLYCOLAX) packet 17 g, 17 g, Oral, BID, Ghimire, Kuber, MD, 17 g at 05/10/24 1055   simvastatin  (ZOCOR ) tablet 10 mg, 10 mg, Oral, QHS, Nida, Juwan Vences, PA-C, 10 mg at 05/09/24 2149   traZODone  (DESYREL ) tablet 50 mg, 50 mg, Oral, QHS PRN, Nida, Jef Futch, PA-C, 50 mg at 05/08/24 2219  Patients Current Diet:  Diet Order  Diet heart healthy/carb modified Fluid consistency: Thin  Diet effective now           Diet - low sodium heart healthy           Diet - low sodium heart healthy                   Precautions / Restrictions Precautions Precautions: Knee, Fall Precaution Booklet Issued: No Precaution/Restrictions Comments: very painful Left leg with WB attempts Restrictions Weight Bearing Restrictions Per Provider Order: Yes LLE Weight Bearing Per Provider Order: Weight bearing as tolerated   Has the patient had 2 or more falls or a fall with injury in the past year? No  Prior Activity Level Community (5-7x/wk): prior to injury in June pt was independent and working in production designer, theatre/television/film at [place], no DME prior to injury  Prior Functional Level Self Care: Did the patient need help bathing, dressing, using the toilet or eating? Independent  Indoor Mobility: Did the patient need assistance with walking from room to room (with or without device)? Independent  Stairs: Did the patient need assistance with internal or external stairs (with or without device)? Independent  Functional Cognition: Did the patient need help planning regular tasks such as shopping or remembering to take medications? Independent  Patient Information Are you of Hispanic, Latino/a,or Spanish origin?: A. No, not of Hispanic, Latino/a, or Spanish origin What is your race?: A. White Do you need or want an interpreter to communicate with a doctor or health care staff?: 1. Yes  Patient's Response To:  Health Literacy and  Transportation Is the patient able to respond to health literacy and transportation needs?: Yes Health Literacy - How often do you need to have someone help you when you read instructions, pamphlets, or other written material from your doctor or pharmacy?: Often (interpreter needed if in English) In the past 12 months, has lack of transportation kept you from medical appointments or from getting medications?: No In the past 12 months, has lack of transportation kept you from meetings, work, or from getting things needed for daily living?: No  Home Assistive Devices / Equipment Home Equipment: Agricultural Consultant (2 wheels), [employer] - single point, Information systems manager  Prior Device Use: Indicate devices/aids used by the patient prior to current illness, exacerbation or injury? Since injury over the summer has been using a RW, but prior to that was fully independent with no DME  Current Functional Level Cognition  Orientation Level: Oriented X4    Extremity Assessment (includes Sensation/Coordination)  Upper Extremity Assessment: Generalized weakness  Lower Extremity Assessment: Defer to PT evaluation RLE Deficits / Details: ROM WFL; MMT 5/5 LLE Deficits / Details: Post op leg.  Significantly limited by pain.  Pt with more c/o severe pain/cramping in L calf.  He is moaning and guarding.  Pt yelling and withdrawing with even light touch to calf and with ankle DF/PF.  He does have pain in knee with attempts to weight bear.  ROM limited by pain ~10 to 30 degrees but not further testing other than with transfers.  MMT: at least 1/5 throughout but not further tested due to pain    ADLs  Overall ADL's : Needs assistance/impaired Eating/Feeding: Set up, Sitting Grooming: Wash/dry face, Set up, Sitting Upper Body Bathing: Set up, Sitting Lower Body Bathing: Maximal assistance, Sitting/lateral leans, +2 for safety/equipment Upper Body Dressing : Minimal assistance, Sitting Lower Body Dressing: Total assistance,  Sitting/lateral leans Toilet Transfer: Cueing for safety, Cueing for sequencing, Moderate assistance, +  2 for safety/equipment (platform walker) Toilet Transfer Details (indicate cue type and reason): Simulated toilet t/f into the recliner Toileting- Clothing Manipulation and Hygiene: Maximal assistance, +2 for safety/equipment, Sit to/from stand Functional mobility during ADLs: Moderate assistance, +2 for safety/equipment, +2 for physical assistance (platform walker) General ADL Comments: TOTAL don/doff LB dressing items, MODA+2 simulated toilet transfer into recliner    Mobility  Overal bed mobility: Needs Assistance Bed Mobility: Sit to Supine Supine to sit: Mod assist Sit to supine: Mod assist General bed mobility comments: mod assist to place LLE ont bed, toleated  like a SLR to lft the leg    Transfers  Overall transfer level: Needs assistance Equipment used: Rolling walker (2 wheels) Transfers: Sit to/from Stand, Bed to chair/wheelchair/BSC Sit to Stand: +2 physical assistance, +2 safety/equipment, From elevated surface, Mod assist Bed to/from chair/wheelchair/BSC transfer type:: Step pivot Step pivot transfers: Mod assist, +2 physical assistance, +2 safety/equipment Transfer via Lift Equipment: [place] General transfer comment: mod assist of 2 to stand at RW from [facility], LLE tended to be too far ahead, Assisted to stand more erct and slide LLE back to provide some support. Right knee tending to  flex. patient took 1 hop step backwards but felth patient was unsteady, bed was brought up and patient  sat down.    Ambulation / Gait / Stairs / Wheelchair Mobility  Ambulation/Gait Ambulation/Gait assistance: Min assist, +2 safety/equipment Gait Distance (Feet): 4 Feet Assistive device: Rolling walker (2 wheels) Gait Pattern/deviations: Step-to pattern, Decreased stride length, Decreased weight shift to left, Shuffle General Gait Details: Pt was able to bear some weight on L LE and take a  few steps with chair follow; min A of 2 for safety ; pt self advancing L LE today Gait velocity: decreased    Posture / Balance Dynamic Sitting Balance Sitting balance - Comments: UE support,, mod A , reports some disZziness when returns to supine Balance Overall balance assessment: Needs assistance Sitting-balance support: No upper extremity supported Sitting balance-Leahy Scale: Fair Sitting balance - Comments: UE support,, mod A , reports some disZziness when returns to supine Standing balance support: Bilateral upper extremity supported, Reliant on assistive device for balance, During functional activity Standing balance-Leahy Scale: Poor Standing balance comment: RW and mod, not straightening right knee, decreased WB    Special considerations/life events  Skin L TKA and Diabetic management yes   Previous Home Environment (from acute therapy documentation) Living Arrangements: Children Available Help at Discharge: Family, Available 24 hours/day Type of Home: House Home Layout: Multi-level Alternate Level Stairs-Rails: Right Alternate Level Stairs-Number of Steps: Pt typically stays upstairs but has full bath downstairs and can convert office to bedroom if needed.  Full flight of steps Home Access: Stairs to enter Entrance Stairs-Number of Steps: 1 threshold Bathroom Shower/Tub: Engineer, Manufacturing Systems: Standard Home Care Services: No  Discharge Living Setting Plans for Discharge Living Setting: Patient's home, Lives with (comment) (large family) Type of Home at Discharge: House Discharge Home Layout: Multi-level, Able to live on main level with bedroom/bathroom Alternate Level Stairs-Rails: Left Alternate Level Stairs-Number of Steps: full flight Discharge Home Access: Stairs to enter Entrance Stairs-Rails: None Entrance Stairs-Number of Steps: 1 Discharge Bathroom Shower/Tub: Walk-in shower Discharge Bathroom Toilet: Standard Discharge Bathroom Accessibility:  Yes How Accessible: Accessible via walker Does the patient have any problems obtaining your medications?: No  Social/Family/Support Systems Anticipated Caregiver: Clark is best contact Anticipated Caregiver's Contact Information: Margery [phone] Ability/Limitations of Caregiver: none stated Caregiver Availability: 24/7 Discharge Plan  Discussed with Primary Caregiver: Yes Is Caregiver In Agreement with Plan?: Yes Does Caregiver/Family have Issues with Lodging/Transportation while Pt is in Rehab?: No  Goals Patient/Family Goal for Rehab: PT/OT supervision to min assist, SLP n/a Expected length of stay: 14-16 days Cultural Considerations: Needs arabic interpreter Additional Information: Discharge plan: home with family who can provide 24/7 assist (large family) Pt/Family Agrees to Admission and willing to participate: Yes Program Orientation Provided & Reviewed with Pt/Caregiver Including Roles  & Responsibilities: Yes  Decrease burden of Care through IP rehab admission: n/a  Possible need for SNF placement upon discharge:  Not anticipated.  Plan for discharge to previously living environment with family.  Pt will stay on the first floor with 1 step and no rails to enter.  Family can provide 24/7 supervision/assist.   Patient Condition: I have reviewed medical records from [facility], spoken with [facility] team, and patient and family member. I met with patient at the bedside and discussed via phone for inpatient rehabilitation assessment.  Patient will benefit from ongoing PT and OT, can actively participate in 3 hours of therapy a day 5 days of the week, and can make measurable gains during the admission.  Patient will also benefit from the coordinated team approach during an Inpatient Acute Rehabilitation admission.  The patient will receive intensive therapy as well as Rehabilitation physician, nursing, social worker, and care management interventions.  Due to safety, skin/wound care,  disease management, medication administration, pain management, and patient education the patient requires 24 hour a day rehabilitation nursing.  The patient is currently mod assist with mobility and basic ADLs.  Discharge setting and therapy post discharge at home with home health is anticipated.  Patient has agreed to participate in the Acute Inpatient Rehabilitation Program and will admit today.  Preadmission Screen Completed By:  Caitlin E Warren, PT, DPT, 05/10/2024 2:00 PM ______________________________________________________________________   Discussed status with Dr. Carilyn on 05/10/24  at 2:24 PM  and received approval for admission today.  Admission Coordinator:  Caitlin E Warren, PT, DPT time 2:24 PM Pattricia  05/10/24    Assessment/Plan: Diagnosis: debility after Pulmonary embolism Does the need for close, 24 hr/day Medical supervision in concert with the patient's rehab needs make it unreasonable for this patient to be served in a less intensive setting? Yes Co-Morbidities requiring supervision/potential complications: Left TKR 04/30/2024 Due to bladder management, bowel management, safety, skin/wound care, disease management, medication administration, pain management, and patient education, does the patient require 24 hr/day rehab nursing? Yes Does the patient require coordinated care of a physician, rehab nurse, PT, OT, and SLP to address physical and functional deficits in the context of the above medical diagnosis(es)? Yes Addressing deficits in the following areas: balance, endurance, locomotion, strength, transferring, bowel/bladder control, bathing, dressing, feeding, grooming, toileting, and psychosocial support Can the patient actively participate in an intensive therapy program of at least 3 hrs of therapy 5 days a week? Yes The potential for patient to make measurable gains while on inpatient rehab is good Anticipated functional outcomes upon discharge from inpatient rehab:  min assist PT, min assist OT, min assist SLP Estimated rehab length of stay to reach the above functional goals is: 14-16d Anticipated discharge destination: Home 10. Overall Rehab/Functional Prognosis: good   MD Signature: Prentice CHARLENA Carilyn M.D. [facility] Fellow Am Acad of Phys Med and Rehab Diplomate Am Board of Electrodiagnostic Med Fellow Am Board of Interventional Pain

## 2024-05-10 NOTE — Progress Notes (Signed)
 Subjective: 10 Days Post-Op Procedure(s) (LRB): ARTHROPLASTY, KNEE, TOTAL (Left)  Patient doing much better this morning. He has had a BM and the new pain meds are working better. He is awaiting CIR placement.  Activity level:  wbat Diet tolerance:  ok Voiding:  ok Patient reports pain as mild and moderate.    Objective: Vital signs in last 24 hours: Temp:  [98.2 F (36.8 C)-98.5 F (36.9 C)] 98.2 F (36.8 C) (11/07 0529) Pulse Rate:  [76-101] 76 (11/07 0529) Resp:  [18-19] 19 (11/07 0529) BP: (112-128)/(71-77) 116/71 (11/07 0529) SpO2:  [95 %] 95 % (11/07 0529)  Labs: Recent Labs    05/08/24 1805  HGB 10.8*   Recent Labs    05/08/24 1805  WBC 8.4  RBC 3.69*  HCT 33.3*  PLT 281   Recent Labs    05/08/24 1805  NA 134*  K 4.2  CL 96*  CO2 29  BUN 17  CREATININE 0.83  GLUCOSE 131*  CALCIUM 9.1   No results for input(s): LABPT, INR in the last 72 hours.  Physical Exam:  Neurologically intact ABD soft Neurovascular intact Sensation intact distally Intact pulses distally Dorsiflexion/Plantar flexion intact Incision: dressing C/D/I and scant drainage No cellulitis present Compartment soft  Assessment/Plan:  10 Days Post-Op Procedure(s) (LRB): ARTHROPLASTY, KNEE, TOTAL (Left) Advance diet Up with therapy Hopefully D/C today to CIR. I spoke with granddaughter and she will have her father follow up with the social working to help approve this.  Continue on percocet for pain control. We greatly appreciate medical management.     Prentice Mt Mckinzie Saksa 05/10/2024, 8:17 AM

## 2024-05-10 NOTE — Plan of Care (Signed)
 ?  Problem: Clinical Measurements: ?Goal: Will remain free from infection ?Outcome: Progressing ?  ?

## 2024-05-10 NOTE — Progress Notes (Signed)
 PROGRESS NOTE    Brandon Clark  FMW:[id number] DOB: 1941/01/29 DOA: 04/30/2024 PCP: Gladystine Erminio CROME, MD   Brief Narrative:   83 year old male with history of GERD/hiatal hernia/PUD, osteoarthritis, BPH, dementia, hyperlipidemia, hypertension, prediabetes, sleep apnea not on CPAP underwent left total knee replacement on 04/30/2024 by orthopedics.  TRH consulted on 05/03/2024 for dyspnea, dizziness and new oxygen requirement.  he was found to have left acute peroneal vein DVT on 05/02/2024 and was already started on Eliquis.  Because of dyspnea and oxygen requirement, he was supposed to be switched to heparin  drip but because of patient's faith/religion, he would not want to take pork products including heparin .  Eliquis was continued.  CTA chest showed acute PE with right heart strain.  Echocardiogram subsequently did not show any significant right heart strain. Patient was maintained on loading dose Eliquis and is still on it. TRH signed off 11/4.  Waiting to go to CIR. 11/5, patient started complaining of episodic severe left precordial chest pain.  Reconsulted.  11/6, adequately stabilized.  Medically stable for discharge to rehab.  Subjective: Patient seen and examined.  Today he tells me that other than his left leg he has no problem.  His right leg is strong.  Patient tells me that he used to walk 25 miles every day but now he has trouble with his left leg.  We dialed patient's son from his phone as well as from the table phone, unable to talk.  Assessment & plan of care:   Pleuritic chest pain secondary to acute PE with right heart strain Acute DVT of left peroneal veins Acute respiratory failure with hypoxia, resolved.  On room air now.  Medically stabilized.  Adequately stabilized with Eliquis. Continue to use incentive spirometry.  Adequate pain relief.  Increase mobility and out of bed.  2D echo showed EF of 60 to 65% with moderate RV enlargement with preserved  function. DVT likely precipitated with chronic knee pain and immobility, 3 to 6 months of Eliquis is recommended depending upon his resumption of mobility.  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  Waiting for rehab.  Stable.  Percocet for pain relief.  Hypertension Hyperlipidemia - Blood pressure mostly stable.  Continue hydrochlorothiazide  - Continue statin  Sleep apnea - Continue CPAP  Probable dementia -continue memantine and donepezil   Diabetes mellitus type 2 -A1c 5.9.  Continue CBGs with SSI  BPH -continue finasteride   Obesity class II - Outpatient follow-up     Objective: Vitals:   05/09/24 0508 05/09/24 1445 05/09/24 2007 05/10/24 0529  BP: (!) 121/92 112/73 128/77 116/71  Pulse: 88 (!) 101 95 76  Resp: 18 18 19 19   Temp: 99.1 F (37.3 C) 98.2 F (36.8 C) 98.5 F (36.9 C) 98.2 F (36.8 C)  TempSrc: Oral Oral Oral   SpO2: 96% 95% 95% 95%  Weight:      Height:        Intake/Output Summary (Last 24 hours) at 05/10/2024 1022 Last data filed at 05/10/2024 1000 Gross per 24 hour  Intake 1380 ml  Output 500 ml  Net 880 ml   Filed Weights   04/30/24 1056 05/03/24 1900 05/03/24 2327  Weight: 120.7 kg 120.7 kg 122 kg    Examination:  General: Pleasant and interactive.  On room air. respiratory: No added sounds. CVS: Currently rate controlled; S1-S2 heard  abdominal: Soft, obese, nontender, distended mildly, no organomegaly; normal bowel sounds are  heard  extremities: Significantly edematous left leg, swelling and ecchymosis of the left knee.    Data Reviewed: I have personally reviewed following labs and imaging studies  CBC: Recent Labs  Lab 05/08/24 1805  WBC 8.4  NEUTROABS 4.5  HGB 10.8*  HCT 33.3*  MCV 90.2  PLT 281   Basic Metabolic Panel: Recent Labs  Lab 05/08/24 1805  NA 134*  K 4.2  CL 96*  CO2 29  GLUCOSE 131*  BUN 17  CREATININE 0.83   CALCIUM 9.1   GFR: Estimated Creatinine Clearance: 94.2 mL/min (by C-G formula based on SCr of 0.83 mg/dL). Liver Function Tests: Recent Labs  Lab 05/08/24 1805  AST 57*  ALT 62*  ALKPHOS 177*  BILITOT 1.3*  PROT 6.3*  ALBUMIN 3.2*   No results for input(s): LIPASE, AMYLASE in the last 168 hours. No results for input(s): AMMONIA in the last 168 hours. Coagulation Profile: No results for input(s): INR, PROTIME in the last 168 hours. Cardiac Enzymes: No results for input(s): CKTOTAL, CKMB, CKMBINDEX, TROPONINI in the last 168 hours. BNP (last 3 results) No results for input(s): PROBNP in the last 8760 hours. HbA1C: No results for input(s): HGBA1C in the last 72 hours.  CBG: Recent Labs  Lab 05/09/24 0734 05/09/24 1137 05/09/24 1628 05/09/24 2136 05/10/24 0752  GLUCAP 148* 152* 148* 168* 123*   Lipid Profile: No results for input(s): CHOL, HDL, LDLCALC, TRIG, CHOLHDL, LDLDIRECT in the last 72 hours. Thyroid  Function Tests: No results for input(s): TSH, T4TOTAL, FREET4, T3FREE, THYROIDAB in the last 72 hours. Anemia Panel: No results for input(s): VITAMINB12, FOLATE, FERRITIN, TIBC, IRON, RETICCTPCT in the last 72 hours. Sepsis Labs: No results for input(s): PROCALCITON, LATICACIDVEN in the last 168 hours.  Recent Results (from the past 240 hours)  MRSA Next Gen by PCR, Nasal     Status: None   Collection Time: 05/03/24 11:43 PM   Specimen: Nasal Mucosa; Nasal Swab  Result Value Ref Range Status   MRSA by PCR Next Gen NOT DETECTED NOT DETECTED Final    Comment: (NOTE) The GeneXpert MRSA Assay (FDA approved for NASAL specimens only), is one component of a comprehensive MRSA colonization surveillance program. It is not intended to diagnose MRSA infection nor to guide or monitor treatment for MRSA infections. Test performance is not FDA approved in patients less than 77 years old. Performed at [facility], [address] [place], [place] [address]          Radiology Studies: DG CHEST PORT 1 VIEW Result Date: 05/08/2024 CLINICAL DATA:  Shortness of breath. EXAM: PORTABLE CHEST 1 VIEW COMPARISON:  Chest radiograph dated 06/09/2021. FINDINGS: No focal consolidation, pleural effusion or pneumothorax. Stable cardiac silhouette. No acute osseous pathology. IMPRESSION: No active disease. Electronically Signed   By: Vanetta Chou M.D.   On: 05/08/2024 20:56   Reviewed his echocardiogram, duplexes and CT angiogram from 10/31.      Scheduled Meds:  amitriptyline  25 mg Oral QHS   apixaban  5 mg Oral BID   Chlorhexidine  Gluconate Cloth  6 each Topical Daily   docusate sodium  100 mg Oral BID   donepezil   10 mg Oral QHS   feeding supplement  1 Container Oral TID BM   feeding supplement  237 mL Oral BID BM   finasteride   5 mg Oral Daily   hydrochlorothiazide   12.5 mg Oral Daily   insulin aspart  0-15 Units Subcutaneous TID WC   insulin aspart  0-5 Units Subcutaneous QHS   memantine  10 mg Oral BID   polyethylene glycol  17 g Oral BID   simvastatin   10 mg Oral QHS   Continuous Infusions:  lactated ringers      lactated ringers      lactated ringers      lactated ringers  Stopped (05/01/24 1226)          Renato Applebaum, MD [facility] 05/10/2024, 10:22 AM

## 2024-05-10 NOTE — Progress Notes (Addendum)
 Physical Therapy Treatment Patient Details Name: Brandon Clark MRN: [id number] DOB: 1941-04-26 Today's Date: 05/10/2024   History of Present Illness Pt is 83 yo male presents to therapy s/p L TKA on 04/30/2024 due to failure of conservative measures. Pt hospitalization complicated by DVT on 10/30 and PE with R heart strain requiring O2 on 10/31 with pt transferred to stepdown care. Pt PMH includes but is not limited to: dementia, s/p Nissen fundoplication procedure, hernia, OSA, BPPV, HTN, ataxia, DOE, arthritis, and B UE and LE pain.    PT Comments  Pt initially making good progress today and was able to ambulate 8' to door with RW and mod A x 2. With return to sitting pt expressed not feeling well and was pale, suspect orthostatic hypotension; however, by time BP taken was normal and all VSS, pt feels back to normal in chair.   Good participation with exercises and gradually improving pain, ROM, and edema.  With gait - pt with unsafe pattern - recommend +2 for hands on and +3 for chair follow if ambulating and a pre-demonstration of safe RW use.  Difficult to provide cues in time due to delay with interpretation and pt easily distracted, needs to be done prior. He had pain meds at least hour prior to session and tolerated.   Cont POC.  Recommend Patient will benefit from intensive inpatient follow-up therapy, >3 hours/day at d/c.    If plan is discharge home, recommend the following: Assistance with cooking/housework;Help with stairs or ramp for entrance;Two people to help with walking and/or transfers;Two people to help with bathing/dressing/bathroom   Can travel by Doctor, Hospital cushion (measurements PT);Wheelchair (measurements PT)    Recommendations for Other Services       Precautions / Restrictions Precautions Precautions: Knee;Fall Restrictions LLE Weight Bearing Per Provider Order: Weight bearing as tolerated      Mobility  Bed Mobility Overal bed mobility: needs assist Bed Mobility: Supine to Sit     Supine to sit: Mod assist     General bed mobility comments: mod A for L LE and to lift trunk    Transfers Overall transfer level: Needs assistance Equipment used: Rolling walker (2 wheels) Transfers: Sit to/from Stand Sit to Stand: Mod assist, +2 physical assistance           General transfer comment: LIght mod A x 2 to stand from slightly elevated bed    Ambulation/Gait Ambulation/Gait assistance: Mod assist, +2 physical assistance Gait Distance (Feet): 8 Feet Assistive device: Rolling walker (2 wheels) Gait Pattern/deviations: Step-to pattern, Decreased stride length, Decreased weight shift to left, Shuffle Gait velocity: decreased     General Gait Details: Pt initially hopping, did step on L LE with cues.  Pt with unsafe gait pattern getting to close to front of RW and difficulty getting him to correct with delay in interpretation.  Requiring mod A of 2 for balance - if ambulating would recommend assist of 2 and chair follow for safety -as well as pre-demonstration of correct RW use. See comments in regards to potential hypotension   Stairs             Wheelchair Mobility     Tilt Bed    Modified Rankin (Stroke Patients Only)       Balance Overall balance assessment: Needs assistance Sitting-balance support: No upper extremity supported Sitting balance-Leahy Scale: Good     Standing balance support: Bilateral upper extremity supported,  Reliant on assistive device for balance Standing balance-Leahy Scale: Poor Standing balance comment: RW and min A static, mod x 2 walking                            Communication    Cognition Arousal: Alert Behavior During Therapy: WFL for tasks assessed/performed   PT - Cognitive impairments: Difficult to assess Difficult to assess due to: Non-English speaking                     PT - Cognition  Comments: Pt cheerful and joking initially, utilized AMN interpretor, overall follows commands and oriented but does have hx of dementia        Cueing    Exercises Total Joint Exercises Ankle Circles/Pumps: AROM, Both, 10 reps Quad Sets: AROM, Both, 10 reps Heel Slides: AAROM, Left, 5 reps, Supine Long Arc Quad: AAROM, Left, 10 reps, Seated Knee Flexion: AAROM, Left, 10 reps, Seated Goniometric ROM: L knee ~15 to 60 degrees    General Comments General comments (skin integrity, edema, etc.): Edema L LE gradually improving.  Educated on resting with leg straight - no pillow under knee.   With return to sitting after walking , noted pt to be pale and holding head.  Feet were elevated.  He denied dizzy but reports feels like my soul leaving my body.  Suspected orthostatic hypotension/vagal as this was the most pt had been on his feet in 10 days and pale.  Pt reclined and retrieved dynamap.  O2 sats 98%, HR 90's , took increased time to get BP (pt kept talking and moving arm, took several mins to get still) and by the time taken was 132/72 and pt reports feeling better.  Notified nurse of episode.    Pertinent Vitals/Pain Pain Assessment Pain Assessment: 0-10 Pain Score: 6  Pain Location: L Knee with standing/WB Pain Descriptors / Indicators: Grimacing, Guarding Pain Intervention(s): Limited activity within patient's tolerance, Monitored during session, Premedicated before session, Repositioned, Ice applied    Home Living                          Prior Function            PT Goals (current goals can now be found in the care plan section) Progress towards PT goals: Progressing toward goals    Frequency    7X/week      PT Plan      Co-evaluation              AM-PAC PT 6 Clicks Mobility   Outcome Measure  Help needed turning from your back to your side while in a flat bed without using bedrails?: A Little Help needed moving from lying on your back to  sitting on the side of a flat bed without using bedrails?: A Lot Help needed moving to and from a bed to a chair (including a wheelchair)?: Total Help needed standing up from a chair using your arms (e.g., wheelchair or bedside chair)?: A Lot Help needed to walk in hospital room?: Total Help needed climbing 3-5 steps with a railing? : Total 6 Click Score: 10    End of Session Equipment Utilized During Treatment: Gait belt Activity Tolerance: Other (comment);Patient tolerated treatment well (?orthostatic hypotension) Patient left: with chair alarm set;in chair;with call bell/phone within reach (leg elevated) Nurse Communication: Mobility status;Other (comment) (?orthostatic hypotension) PT Visit Diagnosis: Other  abnormalities of gait and mobility (R26.89);Muscle weakness (generalized) (M62.81);Pain Pain - Right/Left: Left Pain - part of body: Knee     Time: 8845-8779 PT Time Calculation (min) (ACUTE ONLY): 26 min  Charges:    $Gait Training: 8-22 mins $Therapeutic Exercise: 8-22 mins PT General Charges $$ ACUTE PT VISIT: 1 Visit                     Benjiman, PT Acute Rehab [facility] [phone]    [name] 05/10/2024, 3:41 PM

## 2024-05-10 NOTE — Progress Notes (Signed)
 Physical Therapy Treatment Patient Details Name: Brandon Clark MRN: [id number] DOB: 1940/08/06 Today's Date: 05/10/2024   History of Present Illness Pt is 83 yo male presents to therapy s/p L TKA on 04/30/2024 due to failure of conservative measures. Pt hospitalization complicated by DVT on 10/30 and PE with R heart strain requiring O2 on 10/31 with pt transferred to stepdown care. Pt PMH includes but is not limited to: dementia, s/p Nissen fundoplication procedure, hernia, OSA, BPPV, HTN, ataxia, DOE, arthritis, and B UE and LE pain.    PT Comments  Pt with increased pain in afternoon - offered to return later after he had meds but he reports wants to get back to bed.  Pt not able to ambulate as far -only for steps to pivot to bsc and to bed with assist of 2 limited by pain.  Pt with improved ability to participate when timing with pain meds is optimized. Pt did have dizziness/spinning with return to supine - suspect BPPV but pt not able to tolerate testing (due to knee pain and dizziness) or follow commands for testing particularly with delay with interpretation.  Dizzy symptoms have improved otherwise.  Continue plan of care.    If plan is discharge home, recommend the following: Assistance with cooking/housework;Help with stairs or ramp for entrance;Two people to help with walking and/or transfers;Two people to help with bathing/dressing/bathroom   Can travel by Doctor, Hospital cushion (measurements PT);Wheelchair (measurements PT)    Recommendations for Other Services       Precautions / Restrictions Precautions Precautions: Knee;Fall Restrictions LLE Weight Bearing Per Provider Order: Weight bearing as tolerated     Mobility  Bed Mobility Overal bed mobility: Needs Assistance Bed Mobility: Sit to Supine     Supine to sit: Mod assist Sit to supine: Mod assist   General bed mobility comments: mod A for L LE; pt  expressing dizzy/spinning with return to supine and sitting self up in bed, no nystagmus noted    Transfers Overall transfer level: Needs assistance Equipment used: Rolling walker (2 wheels) Transfers: Sit to/from Stand Sit to Stand: Mod assist, +2 physical assistance   Step pivot transfers: Mod assist, +2 physical assistance       General transfer comment: STS from recliner pivoted to [facility] then STS from [facility] and pivot to bed.   Cues for hand placement and posture. Pt also tending to hold L LE up due to pain , encouraged to at least rest it on the floor for balance and stability.    Ambulation/Gait     General Gait Details: Pain increased in afternoon so only tolerated steps for pivot to bsc then bed.  Offered to wait for pain meds but pt ready to return to bed   Stairs             Wheelchair Mobility     Tilt Bed    Modified Rankin (Stroke Patients Only)       Balance Overall balance assessment: Needs assistance Sitting-balance support: No upper extremity supported Sitting balance-Leahy Scale: Good Sitting balance - Comments: steady in sitting   Standing balance support: Bilateral upper extremity supported, Reliant on assistive device for balance Standing balance-Leahy Scale: Poor Standing balance comment: RW and min A static, mod x 2 walking                            Communication  Cognition Arousal: Alert Behavior During Therapy: WFL for tasks assessed/performed   PT - Cognitive impairments: Difficult to assess Difficult to assess due to: Non-English speaking                     PT - Cognition Comments: Pt pleasant but not as cheerful/joking as am.  REports fatigue and increasing pain, ready to return to bed. Utilized AMN interpretor, overall follows commands and oriented but does have hx of dementia and needs redirecting at times Following commands: Impaired Following commands impaired: Only follows one step commands  consistently    Cueing    Exercises Total Joint Exercises Ankle Circles/Pumps: AROM, Both, 10 reps Quad Sets: AROM, Both, 10 reps Heel Slides: AAROM, Left, 5 reps, Supine Long Arc Quad: AAROM, Left, 10 reps, Seated Knee Flexion: AAROM, Left, 10 reps, Seated Goniometric ROM: L knee ~15 to 60 degrees    General Comments General comments (skin integrity, edema, etc.): Edema L LE gradually improving.  Educated on resting with leg straight - no pillow under knee.      Pertinent Vitals/Pain Pain Assessment Pain Assessment: 0-10 Pain Score: 8  Pain Location: L Knee with standing/WB Pain Descriptors / Indicators: Grimacing, Guarding, Crying Pain Intervention(s): Limited activity within patient's tolerance, Monitored during session, RN gave pain meds during session, Ice applied, Repositioned (Time for meds but pt ready to get bck to bed)    Home Living                          Prior Function            PT Goals (current goals can now be found in the care plan section) Progress towards PT goals: Progressing toward goals    Frequency    7X/week      PT Plan      Co-evaluation              AM-PAC PT 6 Clicks Mobility   Outcome Measure  Help needed turning from your back to your side while in a flat bed without using bedrails?: A Little Help needed moving from lying on your back to sitting on the side of a flat bed without using bedrails?: A Lot Help needed moving to and from a bed to a chair (including a wheelchair)?: Total Help needed standing up from a chair using your arms (e.g., wheelchair or bedside chair)?: A Lot Help needed to walk in hospital room?: Total Help needed climbing 3-5 steps with a railing? : Total 6 Click Score: 10    End of Session Equipment Utilized During Treatment: Gait belt Activity Tolerance: Patient limited by pain Patient left: with call bell/phone within reach;in bed;with bed alarm set (iceman started, pillowcse protective  layer) Nurse Communication: Mobility status;Patient requests pain meds PT Visit Diagnosis: Other abnormalities of gait and mobility (R26.89);Muscle weakness (generalized) (M62.81);Pain Pain - Right/Left: Left Pain - part of body: Knee     Time: 8640-8567 PT Time Calculation (min) (ACUTE ONLY): 33 min  Charges:     $Therapeutic Exercise: 8-22 mins $Therapeutic Activity: 8-22 mins PT General Charges $$ ACUTE PT VISIT: 1 Visit                     Benjiman, PT Acute Rehab Services [facility] [phone]    [name] 05/10/2024, 4:00 PM

## 2024-05-11 LAB — GLUCOSE, CAPILLARY
Glucose-Capillary: 138 mg/dL — ABNORMAL HIGH (ref 70–99)
Glucose-Capillary: 126 mg/dL — ABNORMAL HIGH (ref 70–99)
Glucose-Capillary: 122 mg/dL — ABNORMAL HIGH (ref 70–99)
Glucose-Capillary: 136 mg/dL — ABNORMAL HIGH (ref 70–99)

## 2024-05-11 NOTE — Progress Notes (Signed)
 Physical Therapy Treatment Patient Details Name: Brandon Clark MRN: [id number] DOB: 1940-11-18 Today's Date: 05/11/2024   History of Present Illness Pt is 83 yo male presents to therapy s/p L TKA on 04/30/2024 due to failure of conservative measures. Pt hospitalization complicated by DVT on 10/30 and PE with R heart strain requiring O2 on 10/31 with pt transferred to stepdown care. Pt PMH includes but is not limited to: dementia, s/p Nissen fundoplication procedure, hernia, OSA, BPPV, HTN, ataxia, DOE, arthritis, and B UE and LE pain.    PT Comments  Today's PT session focused on transfers and ambulation.Pt eager and motivated for mobility. Pt performed sit to stand transfers with MOD A+2 and ambulated ~15ft with MOD A+2 with cues for sequencing and intermittent assist for RW management- pt limited by continued increased pain levels with mobility and dizziness. Pt will benefit from continued skilled PT to increase their independence and maximize safety with mobility.      If plan is discharge home, recommend the following: Assistance with cooking/housework;Help with stairs or ramp for entrance;Two people to help with walking and/or transfers;Two people to help with bathing/dressing/bathroom   Can travel by Doctor, Hospital cushion (measurements PT);Wheelchair (measurements PT) (if pt does not d/c to rehab. If d/c to AIR defer to next level of care.)    Recommendations for Other Services       Precautions / Restrictions Precautions Precautions: Knee;Fall Precaution/Restrictions Comments: very painful Left leg Restrictions Weight Bearing Restrictions Per Provider Order: Yes LLE Weight Bearing Per Provider Order: Weight bearing as tolerated     Mobility  Bed Mobility               General bed mobility comments: Pt in recliner pre/post session    Transfers Overall transfer level: Needs assistance Equipment used: Rolling  walker (2 wheels) Transfers: Sit to/from Stand Sit to Stand: Mod assist, +2 physical assistance           General transfer comment: STS from recliner with 1,2,3 count and use of B UEs for power up. Increased tiem in standing for posture and correction of BOS in standing prior to ambulation.    Ambulation/Gait Ambulation/Gait assistance: Mod assist, +2 physical assistance Gait Distance (Feet): 10 Feet Assistive device: Rolling walker (2 wheels) Gait Pattern/deviations: Step-to pattern, Decreased stride length, Decreased weight shift to left (decreased foot clearance L) Gait velocity: decreased     General Gait Details: Intermittent assist for RW management forward, and cues for sequencing and position with use of RW- pt with tendency to step step with L LE prior to pushing RW forward and L foot in front of RW wheels leading to mild posterior leaning. Pain with ambulation and reported dizziness- vitals beginning of session 102/92 (96), 102bpm. Vitals following ambulation 118/92 (101), 112bpm. Close chair follow provided by RN throughout for safety.   Stairs             Wheelchair Mobility     Tilt Bed    Modified Rankin (Stroke Patients Only)       Balance Overall balance assessment: Needs assistance Sitting-balance support: No upper extremity supported Sitting balance-Leahy Scale: Good     Standing balance support: Bilateral upper extremity supported, Reliant on assistive device for balance Standing balance-Leahy Scale: Poor Standing balance comment: RW and min A static, mod x 2 walking  Communication Communication Communication: No apparent difficulties  Cognition Arousal: Alert Behavior During Therapy: WFL for tasks assessed/performed                           PT - Cognition Comments: interpreter utilized during session, pt agreeable and wanting to ambulate despite increased pain levels. Following commands:  Impaired Following commands impaired: Only follows one step commands consistently (potentially due to language barriers-increased time for instruction)    Cueing Cueing Techniques: Verbal cues, Tactile cues, Visual cues  Exercises      General Comments        Pertinent Vitals/Pain Pain Assessment Pain Assessment: Faces Faces Pain Scale: Hurts even more Pain Location: L knee with mobility Pain Descriptors / Indicators: Grimacing, Guarding, Discomfort, Sore, Tender Pain Intervention(s): Limited activity within patient's tolerance, Monitored during session, Repositioned, Patient requesting pain meds-RN notified    Home Living                          Prior Function            PT Goals (current goals can now be found in the care plan section) Acute Rehab PT Goals Patient Stated Goal: decrease pain PT Goal Formulation: With patient/family Time For Goal Achievement: 05/15/24 Potential to Achieve Goals: Good Progress towards PT goals: Progressing toward goals    Frequency    7X/week      PT Plan      Co-evaluation              AM-PAC PT 6 Clicks Mobility   Outcome Measure  Help needed turning from your back to your side while in a flat bed without using bedrails?: A Little Help needed moving from lying on your back to sitting on the side of a flat bed without using bedrails?: A Lot Help needed moving to and from a bed to a chair (including a wheelchair)?: Total Help needed standing up from a chair using your arms (e.g., wheelchair or bedside chair)?: Total Help needed to walk in hospital room?: Total Help needed climbing 3-5 steps with a railing? : Total 6 Click Score: 9    End of Session Equipment Utilized During Treatment: Gait belt Activity Tolerance: Patient limited by pain Patient left: in chair;with call bell/phone within reach;with chair alarm set (pt declined use fo ice, positioned with L LE elevated in recliner) Nurse Communication:  Mobility status;Patient requests pain meds PT Visit Diagnosis: Other abnormalities of gait and mobility (R26.89);Muscle weakness (generalized) (M62.81);Pain Pain - Right/Left: Left Pain - part of body: Knee     Time: 1141-1208 PT Time Calculation (min) (ACUTE ONLY): 27 min  Charges:    $Therapeutic Activity: 23-37 mins PT General Charges $$ ACUTE PT VISIT: 1 Visit                     Tinnie BERRY PT, DPT  Acute Rehabilitation Services  Office [phone]  05/11/2024, 2:18 PM

## 2024-05-11 NOTE — Progress Notes (Signed)
 PROGRESS NOTE    Brandon Clark  FMW:[id number] DOB: January 19, 1941 DOA: 04/30/2024 PCP: Gladystine Erminio CROME, MD   Brief Narrative:   83 year old male with history of GERD/hiatal hernia/PUD, osteoarthritis, BPH, dementia, hyperlipidemia, hypertension, prediabetes, sleep apnea not on CPAP underwent left total knee replacement on 04/30/2024 by orthopedics.  TRH consulted on 05/03/2024 for dyspnea, dizziness and new oxygen requirement.  he was found to have left acute peroneal vein DVT on 05/02/2024 and was already started on Eliquis.  Because of dyspnea and oxygen requirement, he was supposed to be switched to heparin  drip but because of patient's faith/religion, he would not want to take pork products including heparin .  Eliquis was continued.  CTA chest showed acute PE with right heart strain.  Echocardiogram subsequently did not show any significant right heart strain. Patient was maintained on loading dose Eliquis and is still on it. TRH signed off 11/4.  Waiting to go to CIR. 11/5, patient started complaining of episodic severe left precordial chest pain.  Reconsulted.  11/6, adequately stabilized.  Medically stable for discharge to rehab.  Subjective: Patient seen and examined.  Arabic interpreter was used.  Patient was very grateful.  Denies any more chest pain or difficulty breathing.  He was having a hard time due to pain and swelling of the left knee.  Patient tells me that he is doing better than last few days, however it is very difficult for him to move.  He is doing some bed exercises on the left leg. No family at the bedside.  Assessment & plan of care:   Pleuritic chest pain secondary to acute PE with right heart strain Acute DVT of left peroneal veins Hypoxemia, resolved.  On room air now.  Medically stabilized.  Therapeutic with Eliquis. Continue to use incentive spirometry.  Adequate pain relief.  Increase mobility and out of bed.  2D echo showed EF of 60 to 65%  with moderate RV enlargement with preserved function. DVT likely precipitated with chronic knee pain and immobility, 3 to 6 months of Eliquis is recommended depending upon his resumption of mobility.  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  Waiting for rehab.  Stable.  Percocet for pain relief.  Hypertension Hyperlipidemia - Blood pressure mostly stable.  Continue hydrochlorothiazide  - Continue statin  Sleep apnea - Continue CPAP  Probable dementia -continue memantine and donepezil   Diabetes mellitus type 2 -A1c 5.9.  Continue CBGs with SSI  BPH -continue finasteride   Obesity class II - Outpatient follow-up  Medically stable to discharge when bed available.   Objective: Vitals:   05/10/24 0529 05/10/24 1337 05/10/24 2052 05/11/24 0516  BP: 116/71 115/84 120/82 118/85  Pulse: 76 95 87 89  Resp: 19 16 15 15   Temp: 98.2 F (36.8 C) 98.1 F (36.7 C) 98.5 F (36.9 C) 98.2 F (36.8 C)  TempSrc:  Oral Oral Oral  SpO2: 95% 95% 95% 94%  Weight:      Height:        Intake/Output Summary (Last 24 hours) at 05/11/2024 1046 Last data filed at 05/11/2024 0649 Gross per 24 hour  Intake 440 ml  Output 550 ml  Net -110 ml   Filed Weights   04/30/24 1056 05/03/24 1900 05/03/24 2327  Weight: 120.7 kg 120.7 kg 122 kg    Examination:  General: Pleasant and interactive.  Emotionally labile. respiratory: No added sounds. CVS: Currently rate controlled; S1-S2 heard  abdominal: Soft,  obese, nontender, distended mildly, no organomegaly; normal bowel sounds are heard  extremities: Edematous left leg, ankle movements are intact.  Postop dressing with some ecchymosis and surrounding edema.  Distal neurovascular status intact.    Data Reviewed: I have personally reviewed following labs and imaging studies  CBC: Recent Labs  Lab 05/08/24 1805  WBC 8.4  NEUTROABS 4.5  HGB 10.8*  HCT 33.3*   MCV 90.2  PLT 281   Basic Metabolic Panel: Recent Labs  Lab 05/08/24 1805  NA 134*  K 4.2  CL 96*  CO2 29  GLUCOSE 131*  BUN 17  CREATININE 0.83  CALCIUM 9.1   GFR: Estimated Creatinine Clearance: 94.2 mL/min (by C-G formula based on SCr of 0.83 mg/dL). Liver Function Tests: Recent Labs  Lab 05/08/24 1805  AST 57*  ALT 62*  ALKPHOS 177*  BILITOT 1.3*  PROT 6.3*  ALBUMIN 3.2*   No results for input(s): LIPASE, AMYLASE in the last 168 hours. No results for input(s): AMMONIA in the last 168 hours. Coagulation Profile: No results for input(s): INR, PROTIME in the last 168 hours. Cardiac Enzymes: No results for input(s): CKTOTAL, CKMB, CKMBINDEX, TROPONINI in the last 168 hours. BNP (last 3 results) No results for input(s): PROBNP in the last 8760 hours. HbA1C: No results for input(s): HGBA1C in the last 72 hours.  CBG: Recent Labs  Lab 05/10/24 0752 05/10/24 1152 05/10/24 1649 05/10/24 2119 05/11/24 0809  GLUCAP 123* 141* 108* 136* 138*   Lipid Profile: No results for input(s): CHOL, HDL, LDLCALC, TRIG, CHOLHDL, LDLDIRECT in the last 72 hours. Thyroid  Function Tests: No results for input(s): TSH, T4TOTAL, FREET4, T3FREE, THYROIDAB in the last 72 hours. Anemia Panel: No results for input(s): VITAMINB12, FOLATE, FERRITIN, TIBC, IRON, RETICCTPCT in the last 72 hours. Sepsis Labs: No results for input(s): PROCALCITON, LATICACIDVEN in the last 168 hours.  Recent Results (from the past 240 hours)  MRSA Next Gen by PCR, Nasal     Status: None   Collection Time: 05/03/24 11:43 PM   Specimen: Nasal Mucosa; Nasal Swab  Result Value Ref Range Status   MRSA by PCR Next Gen NOT DETECTED NOT DETECTED Final    Comment: (NOTE) The GeneXpert MRSA Assay (FDA approved for NASAL specimens only), is one component of a comprehensive MRSA colonization surveillance program. It is not intended to diagnose MRSA  infection nor to guide or monitor treatment for MRSA infections. Test performance is not FDA approved in patients less than 71 years old. Performed at [facility], [address], [place], [place] [address]          Radiology Studies: No results found.  Reviewed his echocardiogram, duplexes and CT angiogram from 10/31.      Scheduled Meds:  amitriptyline  25 mg Oral QHS   apixaban  5 mg Oral BID   Chlorhexidine  Gluconate Cloth  6 each Topical Daily   docusate sodium  100 mg Oral BID   donepezil   10 mg Oral QHS   feeding supplement  1 Container Oral TID BM   feeding supplement  237 mL Oral BID BM   finasteride   5 mg Oral Daily   hydrochlorothiazide   12.5 mg Oral Daily   insulin aspart  0-15 Units Subcutaneous TID WC   insulin aspart  0-5 Units Subcutaneous QHS   memantine  10 mg Oral BID   polyethylene glycol  17 g Oral BID   simvastatin   10 mg Oral QHS   Continuous Infusions:  lactated  ringers      lactated ringers      lactated ringers      lactated ringers  Stopped (05/01/24 1226)          Renato Applebaum, MD [facility] 05/11/2024, 10:46 AM

## 2024-05-11 NOTE — Plan of Care (Signed)
   Problem: Activity: Goal: Risk for activity intolerance will decrease Outcome: Progressing   Problem: Nutrition: Goal: Adequate nutrition will be maintained Outcome: Progressing

## 2024-05-11 NOTE — Progress Notes (Signed)
     Brandon Clark is a 83 y.o. male   Orthopaedic diagnosis: Status post left TKA 04/30/2024  Subjective: Patient resting comfortably in chair.  Sounds like he had increased pain with therapy yesterday afternoon and also complained of some dizziness.  He does not complain of any dizziness to me currently.  He does complain of pain in the knee and in the calf area.  This is better with pain medicines.  He is on Eliquis.  Plan is for disposition to CIR.  An Arabic interpreter is utilized throughout the encounter.  Objectyive: Vitals:   05/10/24 2052 05/11/24 0516  BP: 120/82 118/85  Pulse: 87 89  Resp: 15 15  Temp: 98.5 F (36.9 C) 98.2 F (36.8 C)  SpO2: 95% 94%     Exam: Awake and alert Respirations even and unlabored No acute distress Beaks in full sentences.  Left knee with dressing in place.  Scant bleeding in the dressing.  Dressing was not removed.  Swelling and aging ecchymosis about the knee and calf.  Calf with some swelling and tenderness.  He is able to plantarflex and dorsiflex the ankle.  Tolerates gentle passive and active range of motion at the knee.  Warm and well-perfused distally with intact sensation.  Assessment: Status post left TKA 04/30/2024 with postoperative DVT and PE.  He is on Eliquis.   Plan: Patient suitable for discharge to CIR when placement obtained.  Weightbearing as tolerated.  Encourage mobilization.  Continue therapy.  Continue Eliquis and pain control as needed.  Greatly appreciate medical management and assistance.   Marsela Kuan J. Shany Marinez, PA-C

## 2024-05-11 NOTE — Progress Notes (Signed)
   05/11/24 1530  PT Visit Information  Last PT Received On 05/11/24  Reason Eval/Treat Not Completed  (pt up on [facility] with x2 NT upon PT entry for PM session-requesting more time on BSC. Therapy tech checked in on pt and plan for PT to comeback to assist with transfer as pt deferred ambulation this afternoon. Upon PT arrival pt back in bed with NT assist.)   Pt declined additional mobility and performance of LE therex per TKA protocol.

## 2024-05-12 DIAGNOSIS — R945 Abnormal results of liver function studies: Secondary | ICD-10-CM

## 2024-05-12 DIAGNOSIS — I82452 Acute embolism and thrombosis of left peroneal vein: Secondary | ICD-10-CM

## 2024-05-12 DIAGNOSIS — I2699 Other pulmonary embolism without acute cor pulmonale: Secondary | ICD-10-CM

## 2024-05-12 DIAGNOSIS — E785 Hyperlipidemia, unspecified: Secondary | ICD-10-CM

## 2024-05-12 DIAGNOSIS — E66812 Obesity, class 2: Secondary | ICD-10-CM

## 2024-05-12 DIAGNOSIS — G4730 Sleep apnea, unspecified: Secondary | ICD-10-CM

## 2024-05-12 DIAGNOSIS — G4700 Insomnia, unspecified: Secondary | ICD-10-CM

## 2024-05-12 DIAGNOSIS — E119 Type 2 diabetes mellitus without complications: Secondary | ICD-10-CM

## 2024-05-12 DIAGNOSIS — Z96652 Presence of left artificial knee joint: Secondary | ICD-10-CM

## 2024-05-12 DIAGNOSIS — Z6836 Body mass index (BMI) 36.0-36.9, adult: Secondary | ICD-10-CM

## 2024-05-12 DIAGNOSIS — N400 Enlarged prostate without lower urinary tract symptoms: Secondary | ICD-10-CM

## 2024-05-12 LAB — GLUCOSE, CAPILLARY
Glucose-Capillary: 141 mg/dL — ABNORMAL HIGH (ref 70–99)
Glucose-Capillary: 150 mg/dL — ABNORMAL HIGH (ref 70–99)
Glucose-Capillary: 137 mg/dL — ABNORMAL HIGH (ref 70–99)
Glucose-Capillary: 143 mg/dL — ABNORMAL HIGH (ref 70–99)

## 2024-05-12 MED ORDER — MELATONIN 5 MG PO TABS
5.0000 mg | ORAL_TABLET | Freq: Every day | ORAL | Status: DC
  Administered 2024-05-12: 5 mg via ORAL
  Filled 2024-05-12: qty 1

## 2024-05-12 NOTE — Plan of Care (Signed)
   Problem: Health Behavior/Discharge Planning: Goal: Ability to manage health-related needs will improve Outcome: Progressing   Problem: Clinical Measurements: Goal: Ability to maintain clinical measurements within normal limits will improve Outcome: Progressing Goal: Will remain free from infection Outcome: Progressing

## 2024-05-12 NOTE — Progress Notes (Signed)
.  Subjective: 12 Days Post-Op Procedure(s) (LRB): ARTHROPLASTY, KNEE, TOTAL (Left)  Resting comfortably in chair. Pain controlled with medication though still significant difficulty with PT  Activity level:  WBAT Patient reports pain as moderate.    Objective: Vital signs in last 24 hours: Temp:  [97.5 F (36.4 C)-98.4 F (36.9 C)] 97.5 F (36.4 C) (11/09 0611) Pulse Rate:  [88-94] 91 (11/09 0611) Resp:  [14-16] 14 (11/09 0611) BP: (97-134)/(77-92) 97/77 (11/09 0611) SpO2:  [93 %-98 %] 93 % (11/09 0611)  Labs: No results for input(s): HGB in the last 72 hours. No results for input(s): WBC, RBC, HCT, PLT in the last 72 hours. No results for input(s): NA, K, CL, CO2, BUN, CREATININE, GLUCOSE, CALCIUM in the last 72 hours. No results for input(s): LABPT, INR in the last 72 hours.  Physical Exam:  Neurologically intact ABD soft Neurovascular intact Sensation intact distally Intact pulses distally Dorsiflexion/Plantar flexion intact Incision: scant drainage Moderate swelling about knee and calf with tenderness  Assessment/Plan:  12 Days Post-Op Procedure(s) (LRB): ARTHROPLASTY, KNEE, TOTAL (Left)  WBAT Continue Eliquis Pain control as needed Continue PT Pending discharge to CIR     Brandon Clark Mon 05/12/2024, 10:32 AM

## 2024-05-12 NOTE — Progress Notes (Signed)
 PROGRESS NOTE  Brandon Clark FMW:[id number] DOB: April 26, 1941   PCP: Gladystine Erminio CROME, MD  Patient is from: Home.  DOA: 04/30/2024 LOS: 9  Chief complaints No chief complaint on file.    Brief Narrative / Interim history: 83 year old male with history of GERD/hiatal hernia/PUD, osteoarthritis, BPH, dementia, hyperlipidemia, hypertension, prediabetes, sleep apnea not on CPAP underwent left total knee replacement on 04/30/2024 by orthopedics.  TRH consulted on 05/03/2024 for dyspnea, dizziness and new oxygen requirement.  he was found to have left acute peroneal vein DVT on 05/02/2024 and was already started on Eliquis.  Because of dyspnea and oxygen requirement, he was supposed to be switched to heparin  drip but because of patient's faith/religion, he would not want to take pork products including heparin .  Eliquis was continued.  CTA chest showed acute PE with right heart strain.  Echocardiogram subsequently did not show any significant right heart strain. Patient was maintained on loading dose Eliquis and is still on it. TRH signed off 11/4.  Waiting to go to CIR. 11/5, patient started complaining of episodic severe left precordial chest pain.  Reconsulted.   11/6, adequately stabilized.  Medically stable for discharge to rehab.   Subjective: Seen and examined earlier this morning with the help of video interpreter.  Patient reports improvement in his left leg swelling and range of motion.  Asking for some medication to help with sleep.  Having regular bowel movements.  Denies chest pain or shortness of breath   Assessment and plan: Pleuritic chest pain secondary to acute PE with right heart strain Acute DVT of left peroneal veins -TTE without significant finding.  Hypoxemia resolved. - Continue Eliquis for anticoagulation -Continue encouraging incentive spirometry  Left knee osteoarthritis s/p left TKA. -Continue postop care as per primary orthopedics team.      Hypertension: Stable -Continue home HCTZ  Hyperlipidemia - Continue statin   Sleep apnea - Continue CPAP   Probable dementia: Seems to be fairly oriented -Continue memantine and donepezil    Diabetes mellitus type 2: A1c 5.9%. -Continue CBGs with SSI   BPH without LUTS -Continue finasteride   Elevated liver enzymes: Mild -Monitor intermittently.  Insomnia - Continue home amitriptyline -Add melatonin.   Class II obesity Body mass index is 36.48 kg/m.          DVT prophylaxis:  SCDs Start: 04/30/24 1719 Place TED hose Start: 04/30/24 1719 apixaban (ELIQUIS) tablet 5 mg  Code Status: Full code Family Communication: None at bedside Level of care: Med-Surg Status is: Inpatient Remains inpatient appropriate because: CIR.   Final disposition: AIR   35 minutes with more than 50% spent in reviewing records, counseling patient/family and coordinating care.  Consultants:  Orthopedic surgery  Procedures: 10/28-left TKR by Dr. Roxianne  Microbiology summarized: None  Objective: Vitals:   05/11/24 0516 05/11/24 1315 05/11/24 2101 05/12/24 0611  BP: 118/85 (!) 134/92 114/79 97/77  Pulse: 89 94 88 91  Resp: 15 16 15 14   Temp: 98.2 F (36.8 C) 98.4 F (36.9 C) 97.8 F (36.6 C) (!) 97.5 F (36.4 C)  TempSrc: Oral  Oral Oral  SpO2: 94% 98% 94% 93%  Weight:      Height:        Examination:  GENERAL: No apparent distress.  Nontoxic. HEENT: MMM.  Vision and hearing grossly intact.  NECK: Supple.  No apparent JVD.  RESP:  No IWOB.  Fair aeration bilaterally. CVS:  RRR. Heart sounds normal.  ABD/GI/GU: BS+. Abd soft, NTND.  MSK/EXT:  Moves extremities.  S/p left TKR.  Significant left leg swelling with some discoloration SKIN: As above. NEURO: AA.  Oriented appropriately.  No apparent focal neuro deficit. PSYCH: Calm. Normal affect.   Sch Meds:  Scheduled Meds:  amitriptyline  25 mg Oral QHS   apixaban  5 mg Oral BID   Chlorhexidine  Gluconate Cloth   6 each Topical Daily   docusate sodium  100 mg Oral BID   donepezil   10 mg Oral QHS   feeding supplement  1 Container Oral TID BM   feeding supplement  237 mL Oral BID BM   finasteride   5 mg Oral Daily   hydrochlorothiazide   12.5 mg Oral Daily   insulin aspart  0-15 Units Subcutaneous TID WC   insulin aspart  0-5 Units Subcutaneous QHS   memantine  10 mg Oral BID   polyethylene glycol  17 g Oral BID   simvastatin   10 mg Oral QHS   Continuous Infusions:  lactated ringers      lactated ringers      lactated ringers      lactated ringers  Stopped (05/01/24 1226)   PRN Meds:.acetaminophen , alum & mag hydroxide-simeth, bisacodyl , diphenhydrAMINE , menthol **OR** phenol, methocarbamol  **OR** methocarbamol  (ROBAXIN ) injection, metoCLOPramide **OR** metoCLOPramide (REGLAN) injection, morphine injection, oxyCODONE -acetaminophen , traZODone   Antimicrobials: Anti-infectives (From admission, onward)    Start     Dose/Rate Route Frequency Ordered Stop   04/30/24 1830  ceFAZolin  (ANCEF ) IVPB 2g/100 mL premix        2 g 200 mL/hr over 30 Minutes Intravenous Every 6 hours 04/30/24 1454 05/01/24 0330   04/30/24 1030  ceFAZolin  (ANCEF ) IVPB 3g/150 mL premix        3 g 300 mL/hr over 30 Minutes Intravenous On call to O.R. 04/30/24 1017 04/30/24 1230        I have personally reviewed the following labs and images: CBC: Recent Labs  Lab 05/08/24 1805  WBC 8.4  NEUTROABS 4.5  HGB 10.8*  HCT 33.3*  MCV 90.2  PLT 281   BMP &GFR Recent Labs  Lab 05/08/24 1805  NA 134*  K 4.2  CL 96*  CO2 29  GLUCOSE 131*  BUN 17  CREATININE 0.83  CALCIUM 9.1   Estimated Creatinine Clearance: 94.2 mL/min (by C-G formula based on SCr of 0.83 mg/dL). Liver & Pancreas: Recent Labs  Lab 05/08/24 1805  AST 57*  ALT 62*  ALKPHOS 177*  BILITOT 1.3*  PROT 6.3*  ALBUMIN 3.2*   No results for input(s): LIPASE, AMYLASE in the last 168 hours. No results for input(s): AMMONIA in the last 168  hours. Diabetic: No results for input(s): HGBA1C in the last 72 hours. Recent Labs  Lab 05/11/24 0809 05/11/24 1332 05/11/24 1713 05/11/24 2102 05/12/24 0746  GLUCAP 138* 126* 122* 136* 141*   Cardiac Enzymes: No results for input(s): CKTOTAL, CKMB, CKMBINDEX, TROPONINI in the last 168 hours. No results for input(s): PROBNP in the last 8760 hours. Coagulation Profile: No results for input(s): INR, PROTIME in the last 168 hours. Thyroid  Function Tests: No results for input(s): TSH, T4TOTAL, FREET4, T3FREE, THYROIDAB in the last 72 hours. Lipid Profile: No results for input(s): CHOL, HDL, LDLCALC, TRIG, CHOLHDL, LDLDIRECT in the last 72 hours. Anemia Panel: No results for input(s): VITAMINB12, FOLATE, FERRITIN, TIBC, IRON, RETICCTPCT in the last 72 hours. Urine analysis:    Component Value Date/Time   COLORURINE YELLOW 12/19/2023 1857   APPEARANCEUR CLEAR 12/19/2023 1857   LABSPEC 1.021 12/19/2023 1857   PHURINE 5.5 12/19/2023 1857   GLUCOSEU  NEGATIVE 12/19/2023 1857   HGBUR NEGATIVE 12/19/2023 1857   BILIRUBINUR NEGATIVE 12/19/2023 1857   BILIRUBINUR negative 12/15/2014 1034   KETONESUR NEGATIVE 12/19/2023 1857   PROTEINUR NEGATIVE 12/19/2023 1857   UROBILINOGEN 0.2 12/15/2014 1034   UROBILINOGEN 0.2 06/30/2014 0943   NITRITE NEGATIVE 12/19/2023 1857   LEUKOCYTESUR NEGATIVE 12/19/2023 1857   Sepsis Labs: Invalid input(s): PROCALCITONIN, LACTICIDVEN  Microbiology: Recent Results (from the past 240 hours)  MRSA Next Gen by PCR, Nasal     Status: None   Collection Time: 05/03/24 11:43 PM   Specimen: Nasal Mucosa; Nasal Swab  Result Value Ref Range Status   MRSA by PCR Next Gen NOT DETECTED NOT DETECTED Final    Comment: (NOTE) The GeneXpert MRSA Assay (FDA approved for NASAL specimens only), is one component of a comprehensive MRSA colonization surveillance program. It is not intended to diagnose MRSA infection  nor to guide or monitor treatment for MRSA infections. Test performance is not FDA approved in patients less than 34 years old. Performed at [facility], [address], [place], [place] [address]     Radiology Studies: No results found.    Marga Gramajo T. Perris Conwell [facility]  If 7PM-7AM, please contact night-coverage [email or web address] 05/12/2024, 11:06 AM

## 2024-05-12 NOTE — Progress Notes (Signed)
 Physical Therapy Treatment Patient Details Name: Brandon Clark MRN: [id number] DOB: 13-Aug-1940 Today's Date: 05/12/2024   History of Present Illness Pt is 83 yo male presents to therapy s/p L TKA on 04/30/2024 due to failure of conservative measures. Pt hospitalization complicated by DVT on 10/30 and PE with R heart strain requiring O2 on 10/31 with pt transferred to stepdown care. Pt PMH includes but is not limited to: dementia, s/p Nissen fundoplication procedure, hernia, OSA, BPPV, HTN, ataxia, DOE, arthritis, and B UE and LE pain.    PT Comments  Pt continues to progress toward acute PT goals this session with progression of ambulation distance. Pt performed sit to stand initially MOD A +2 from [facility] and progressed to CGA with MOD A for RW stability with use of B UEs for power up from recliner armrest and RW. Pt ambulated ~74ft. 30ft, 56ft, 79ft with seated rest breaks between. Pt initially MOD A+2 for stability and demonstrating NWB on L LE due to increased pain, improved WB on L LE able to progress to MIN A+2 for safety with close chair follow provided throughout. Pt will benefit from continued skilled PT to increase their independence and maximize safety with mobility.      If plan is discharge home, recommend the following: Assistance with cooking/housework;Help with stairs or ramp for entrance;Two people to help with walking and/or transfers;Two people to help with bathing/dressing/bathroom   Can travel by [facility] cushion (measurements PT);Wheelchair (measurements PT) (if pt does not d/c to rehab. If d/c to a rehab facility defer to next level of care.)    Recommendations for Other Services       Precautions / Restrictions Precautions Precautions: Knee;Fall Restrictions Weight Bearing Restrictions Per Provider Order: Yes LLE Weight Bearing Per Provider Order: Weight bearing as tolerated     Mobility  Bed Mobility                General bed mobility comments: Pt in recliner pre/post session    Transfers Overall transfer level: Needs assistance Equipment used: Rolling walker (2 wheels) Transfers: Sit to/from Stand, Bed to chair/wheelchair/BSC Sit to Stand: Mod assist, +2 safety/equipment   Step pivot transfers: Min assist, +2 physical assistance, +2 safety/equipment       General transfer comment: Pt on BSC upon PT arrival intially. Assisted with transfer from [facility] to recliner, pt demonstrating NWB on L LE with use of RW. Pt request PT return after few minutes to allow for room to freshen up to continue PT session. Pt perfomed STS x 4 during session with seated rest breaks during ambulation. pt progressed to CGA for power up to stand with use of B UE support and MOD A for RW stability with stand. Increased time to stedy in standing and to improve BOS prior to forward ambulation. Vitals following transfer from [facility] (129/86, 105bpm, 85%. Pt with some dizziness reported.    Ambulation/Gait Ambulation/Gait assistance: Mod assist, +2 physical assistance, +2 safety/equipment Gait Distance (Feet): 15 Feet Assistive device: Rolling walker (2 wheels) Gait Pattern/deviations: Step-to pattern, Decreased weight shift to left, Decreased dorsiflexion - left, Decreased stance time - left, Antalgic Gait velocity: decreased     General Gait Details: pt ambulated 42ft (121/96 (MAP104), 1063pm, 50ft, 40ft (127/92 (MAP 102), 106bpm), 84ft with seated rest breaks between. Close chair follow provided throughout. Pt initially MOD A+2 for stability demonstrating NWB on L LE. Cues for increased WB on L  LE on following bouts of ambulation and pt with improved WB on L- able to progress to MIN A with +2 provided for safety. Assist for proper RW management forward with therapist's foot as 2 hands needed on pt for safety- pt with tendency to step too far in front of RW leading with L LE leading to posterior leaning- multimodal  cuing for proper body position in RW. Pt reporting improvements in dizziness during session. Vitals end of session 141/81 (99). Pt moving UE being assessed required repeated cues for relaxing during vitals assessment.   Stairs             Wheelchair Mobility     Tilt Bed    Modified Rankin (Stroke Patients Only)       Balance Overall balance assessment: Needs assistance Sitting-balance support: No upper extremity supported Sitting balance-Leahy Scale: Good     Standing balance support: Bilateral upper extremity supported, Reliant on assistive device for balance Standing balance-Leahy Scale: Poor                              Communication Communication Communication: No apparent difficulties  Cognition Arousal: Alert Behavior During Therapy: WFL for tasks assessed/performed                           PT - Cognition Comments: video interpreter utilized during session. Bouchra #[id number] and Raghad [id number]. Following commands: Intact Following commands impaired:  (increased time with language barriers)    Cueing Cueing Techniques: Verbal cues, Tactile cues, Visual cues  Exercises      General Comments        Pertinent Vitals/Pain Pain Assessment Pain Assessment: Faces Faces Pain Scale: Hurts even more Pain Location: L knee with mobility Pain Descriptors / Indicators: Grimacing, Guarding, Discomfort, Sore, Tender Pain Intervention(s): Limited activity within patient's tolerance, Monitored during session, Patient requesting pain meds-RN notified, Repositioned (pt declined ice)    Home Living                          Prior Function            PT Goals (current goals can now be found in the care plan section) Acute Rehab PT Goals Patient Stated Goal: decrease pain, improve mobility PT Goal Formulation: With patient/family Time For Goal Achievement: 05/15/24 Potential to Achieve Goals: Good Progress towards PT goals:  Progressing toward goals    Frequency    7X/week      PT Plan      Co-evaluation              AM-PAC PT 6 Clicks Mobility   Outcome Measure  Help needed turning from your back to your side while in a flat bed without using bedrails?: A Little Help needed moving from lying on your back to sitting on the side of a flat bed without using bedrails?: A Lot Help needed moving to and from a bed to a chair (including a wheelchair)?: Total Help needed standing up from a chair using your arms (e.g., wheelchair or bedside chair)?: A Lot Help needed to walk in hospital room?: Total Help needed climbing 3-5 steps with a railing? : Total 6 Click Score: 10    End of Session Equipment Utilized During Treatment: Gait belt Activity Tolerance: Patient tolerated treatment well Patient left: in chair;with call bell/phone within reach;with chair alarm set;with  nursing/sitter in room Nurse Communication: Mobility status PT Visit Diagnosis: Other abnormalities of gait and mobility (R26.89);Muscle weakness (generalized) (M62.81);Pain Pain - Right/Left: Left Pain - part of body: Knee     Time: 8640-8492 (PT in room from 159pm-215pm and 229pm-307pm per pt request to return after transfer from [facility] to recliner. Total treatment time: ) PT Time Calculation (min) (ACUTE ONLY): 68 min  Charges:    $Therapeutic Activity: 53-67 mins PT General Charges $$ ACUTE PT VISIT: 1 Visit                     Tinnie BERRY PT, DPT  Acute Rehabilitation Services  Office [phone]   05/12/2024, 3:52 PM

## 2024-05-12 NOTE — Plan of Care (Signed)
  Problem: Health Behavior/Discharge Planning: Goal: Ability to manage health-related needs will improve Outcome: Progressing   Problem: Pain Managment: Goal: General experience of comfort will improve and/or be controlled Outcome: Progressing

## 2024-05-13 ENCOUNTER — Inpatient Hospital Stay (HOSPITAL_COMMUNITY)
Admission: AD | Admit: 2024-05-13 | Discharge: 2024-05-24 | DRG: 946 | Disposition: A | Discharge status: Home / Self Care | Payer: Worker's Compensation | Source: Other Acute Inpatient Hospital | Attending: Physical Medicine and Rehabilitation | Admitting: Physical Medicine and Rehabilitation

## 2024-05-13 ENCOUNTER — Encounter (HOSPITAL_COMMUNITY): Payer: Self-pay | Admitting: Physical Medicine and Rehabilitation

## 2024-05-13 ENCOUNTER — Other Ambulatory Visit: Payer: Self-pay

## 2024-05-13 DIAGNOSIS — Z79899 Other long term (current) drug therapy: Secondary | ICD-10-CM

## 2024-05-13 DIAGNOSIS — Z7901 Long term (current) use of anticoagulants: Secondary | ICD-10-CM

## 2024-05-13 DIAGNOSIS — Z91014 Allergy to mammalian meats: Secondary | ICD-10-CM | POA: Diagnosis not present

## 2024-05-13 DIAGNOSIS — E785 Hyperlipidemia, unspecified: Secondary | ICD-10-CM | POA: Diagnosis present

## 2024-05-13 DIAGNOSIS — E66812 Obesity, class 2: Secondary | ICD-10-CM | POA: Diagnosis present

## 2024-05-13 DIAGNOSIS — R7303 Prediabetes: Secondary | ICD-10-CM | POA: Diagnosis present

## 2024-05-13 DIAGNOSIS — K219 Gastro-esophageal reflux disease without esophagitis: Secondary | ICD-10-CM | POA: Diagnosis present

## 2024-05-13 DIAGNOSIS — K5900 Constipation, unspecified: Secondary | ICD-10-CM | POA: Diagnosis present

## 2024-05-13 DIAGNOSIS — Z888 Allergy status to other drugs, medicaments and biological substances status: Secondary | ICD-10-CM

## 2024-05-13 DIAGNOSIS — Z96652 Presence of left artificial knee joint: Secondary | ICD-10-CM

## 2024-05-13 DIAGNOSIS — Z86718 Personal history of other venous thrombosis and embolism: Secondary | ICD-10-CM

## 2024-05-13 DIAGNOSIS — Z86711 Personal history of pulmonary embolism: Secondary | ICD-10-CM | POA: Diagnosis not present

## 2024-05-13 DIAGNOSIS — I10 Essential (primary) hypertension: Secondary | ICD-10-CM | POA: Diagnosis present

## 2024-05-13 DIAGNOSIS — Z6836 Body mass index (BMI) 36.0-36.9, adult: Secondary | ICD-10-CM | POA: Diagnosis not present

## 2024-05-13 DIAGNOSIS — G4733 Obstructive sleep apnea (adult) (pediatric): Secondary | ICD-10-CM | POA: Diagnosis present

## 2024-05-13 DIAGNOSIS — E7800 Pure hypercholesterolemia, unspecified: Secondary | ICD-10-CM

## 2024-05-13 DIAGNOSIS — Z91199 Patient's noncompliance with other medical treatment and regimen due to unspecified reason: Secondary | ICD-10-CM | POA: Diagnosis not present

## 2024-05-13 DIAGNOSIS — Z833 Family history of diabetes mellitus: Secondary | ICD-10-CM | POA: Diagnosis not present

## 2024-05-13 DIAGNOSIS — N400 Enlarged prostate without lower urinary tract symptoms: Secondary | ICD-10-CM | POA: Diagnosis present

## 2024-05-13 DIAGNOSIS — F0390 Unspecified dementia without behavioral disturbance: Secondary | ICD-10-CM | POA: Diagnosis present

## 2024-05-13 DIAGNOSIS — I2699 Other pulmonary embolism without acute cor pulmonale: Principal | ICD-10-CM

## 2024-05-13 DIAGNOSIS — R5381 Other malaise: Principal | ICD-10-CM

## 2024-05-13 DIAGNOSIS — R399 Unspecified symptoms and signs involving the genitourinary system: Secondary | ICD-10-CM

## 2024-05-13 LAB — GLUCOSE, CAPILLARY
Glucose-Capillary: 133 mg/dL — ABNORMAL HIGH (ref 70–99)
Glucose-Capillary: 131 mg/dL — ABNORMAL HIGH (ref 70–99)
Glucose-Capillary: 110 mg/dL — ABNORMAL HIGH (ref 70–99)
Glucose-Capillary: 123 mg/dL — ABNORMAL HIGH (ref 70–99)

## 2024-05-13 LAB — COMPREHENSIVE METABOLIC PANEL WITH GFR
ALT: 85 U/L — ABNORMAL HIGH (ref 0–44)
AST: 76 U/L — ABNORMAL HIGH (ref 15–41)
Albumin: 3.4 g/dL — ABNORMAL LOW (ref 3.5–5.0)
Alkaline Phosphatase: 274 U/L — ABNORMAL HIGH (ref 38–126)
Anion gap: 7 (ref 5–15)
BUN: 19 mg/dL (ref 8–23)
CO2: 30 mmol/L (ref 22–32)
Calcium: 9.4 mg/dL (ref 8.9–10.3)
Chloride: 99 mmol/L (ref 98–111)
Creatinine, Ser: 0.9 mg/dL (ref 0.61–1.24)
GFR, Estimated: >60 mL/min (ref >60)
Glucose, Bld: 133 mg/dL — ABNORMAL HIGH (ref 70–99)
Potassium: 4.5 mmol/L (ref 3.5–5.1)
Sodium: 136 mmol/L (ref 135–145)
Total Bilirubin: 1.7 mg/dL — ABNORMAL HIGH (ref 0.0–1.2)
Total Protein: 6.4 g/dL — ABNORMAL LOW (ref 6.5–8.1)

## 2024-05-13 LAB — RETICULOCYTES
Immature Retic Fract: 25.8 % — ABNORMAL HIGH (ref 2.3–15.9)
RBC.: 4 MIL/uL — ABNORMAL LOW (ref 4.22–5.81)
Retic Count, Absolute: 132 K/uL (ref 19.0–186.0)
Retic Ct Pct: 3.3 % — ABNORMAL HIGH (ref 0.4–3.1)

## 2024-05-13 LAB — CBC
HCT: 35.1 % — ABNORMAL LOW (ref 39.0–52.0)
Hemoglobin: 11.2 g/dL — ABNORMAL LOW (ref 13.0–17.0)
MCH: 28.8 pg (ref 26.0–34.0)
MCHC: 31.9 g/dL (ref 30.0–36.0)
MCV: 90.2 fL (ref 80.0–100.0)
Platelets: 427 K/uL — ABNORMAL HIGH (ref 150–400)
RBC: 3.89 MIL/uL — ABNORMAL LOW (ref 4.22–5.81)
RDW: 13.2 % (ref 11.5–15.5)
WBC: 7.6 K/uL (ref 4.0–10.5)
nRBC: 0 % (ref 0.0–0.2)

## 2024-05-13 LAB — IRON AND TIBC
Iron: 39 ug/dL — ABNORMAL LOW (ref 45–182)
Saturation Ratios: 17 % — ABNORMAL LOW (ref 17.9–39.5)
TIBC: 237 ug/dL — ABNORMAL LOW (ref 250–450)
UIBC: 197 ug/dL

## 2024-05-13 LAB — FERRITIN: Ferritin: 878 ng/mL — ABNORMAL HIGH (ref 24–336)

## 2024-05-13 LAB — VITAMIN B12: Vitamin B-12: 2334 pg/mL — ABNORMAL HIGH (ref 180–914)

## 2024-05-13 LAB — FOLATE: Folate: >20 ng/mL (ref >5.9)

## 2024-05-13 LAB — MAGNESIUM: Magnesium: 2.2 mg/dL (ref 1.7–2.4)

## 2024-05-13 MED ORDER — METFORMIN HCL 500 MG PO TABS: 500.0000 mg | ORAL_TABLET | Freq: Two times a day (BID) | ORAL | Status: DC

## 2024-05-13 MED ORDER — TRAZODONE HCL 50 MG PO TABS
50.0000 mg | ORAL_TABLET | Freq: Every evening | ORAL | Status: DC | PRN
  Administered 2024-05-14 – 2024-05-22 (×5): 50 mg via ORAL
  Filled 2024-05-13 (×7): qty 1

## 2024-05-13 MED ORDER — ENSURE SURGERY PO LIQD
237.0000 mL | Freq: Two times a day (BID) | ORAL | Status: DC
  Administered 2024-05-14 – 2024-05-17 (×5): 237 mL via ORAL
  Filled 2024-05-13 (×18): qty 237

## 2024-05-13 MED ORDER — METHOCARBAMOL 1000 MG/10ML IJ SOLN: 500.0000 mg | Freq: Four times a day (QID) | INTRAMUSCULAR | Status: DC | PRN

## 2024-05-13 MED ORDER — AMITRIPTYLINE HCL 25 MG PO TABS
25.0000 mg | ORAL_TABLET | Freq: Every day | ORAL | Status: DC
  Administered 2024-05-13 – 2024-05-21 (×9): 25 mg via ORAL
  Filled 2024-05-13 (×9): qty 1

## 2024-05-13 MED ORDER — ALUM & MAG HYDROXIDE-SIMETH 200-200-20 MG/5ML PO SUSP: 30.0000 mL | ORAL | Status: DC | PRN

## 2024-05-13 MED ORDER — APIXABAN 5 MG PO TABS: 5.0000 mg | ORAL_TABLET | Freq: Two times a day (BID) | ORAL | Status: DC

## 2024-05-13 MED ORDER — OXYCODONE-ACETAMINOPHEN 5-325 MG PO TABS
1.0000 | ORAL_TABLET | Freq: Four times a day (QID) | ORAL | Status: DC | PRN
  Administered 2024-05-13: 1 via ORAL
  Administered 2024-05-13 – 2024-05-14 (×2): 2 via ORAL
  Filled 2024-05-13 (×3): qty 2

## 2024-05-13 MED ORDER — APIXABAN 5 MG PO TABS
5.0000 mg | ORAL_TABLET | Freq: Two times a day (BID) | ORAL | Status: DC
  Administered 2024-05-13 – 2024-05-24 (×22): 5 mg via ORAL
  Filled 2024-05-13 (×22): qty 1

## 2024-05-13 MED ORDER — BOOST / RESOURCE BREEZE PO LIQD CUSTOM
1.0000 | Freq: Three times a day (TID) | ORAL | Status: DC
  Administered 2024-05-13 – 2024-05-21 (×11): 1 via ORAL

## 2024-05-13 MED ORDER — MEMANTINE HCL 10 MG PO TABS
10.0000 mg | ORAL_TABLET | Freq: Two times a day (BID) | ORAL | Status: DC
  Administered 2024-05-13 – 2024-05-15 (×5): 10 mg via ORAL
  Filled 2024-05-13 (×6): qty 1

## 2024-05-13 MED ORDER — POLYETHYLENE GLYCOL 3350 17 G PO PACK
17.0000 g | PACK | Freq: Two times a day (BID) | ORAL | Status: DC
  Administered 2024-05-14 – 2024-05-15 (×2): 17 g via ORAL
  Filled 2024-05-13 (×4): qty 1

## 2024-05-13 MED ORDER — ENSURE SURGERY PO LIQD: 237.0000 mL | Freq: Two times a day (BID) | ORAL | Status: DC

## 2024-05-13 MED ORDER — SIMVASTATIN 20 MG PO TABS
10.0000 mg | ORAL_TABLET | Freq: Every day | ORAL | Status: DC
  Administered 2024-05-13 – 2024-05-23 (×11): 10 mg via ORAL
  Filled 2024-05-13 (×11): qty 1

## 2024-05-13 MED ORDER — DOCUSATE SODIUM 100 MG PO CAPS
100.0000 mg | ORAL_CAPSULE | Freq: Two times a day (BID) | ORAL | Status: DC
  Administered 2024-05-13 – 2024-05-22 (×17): 100 mg via ORAL
  Filled 2024-05-13 (×18): qty 1

## 2024-05-13 MED ORDER — BISACODYL 5 MG PO TBEC
5.0000 mg | DELAYED_RELEASE_TABLET | Freq: Every day | ORAL | Status: DC | PRN
  Administered 2024-05-21: 5 mg via ORAL
  Filled 2024-05-13: qty 1

## 2024-05-13 MED ORDER — FINASTERIDE 5 MG PO TABS
5.0000 mg | ORAL_TABLET | Freq: Every day | ORAL | Status: DC
  Administered 2024-05-14 – 2024-05-24 (×11): 5 mg via ORAL
  Filled 2024-05-13 (×11): qty 1

## 2024-05-13 MED ORDER — ACETAMINOPHEN 325 MG PO TABS
325.0000 mg | ORAL_TABLET | Freq: Four times a day (QID) | ORAL | Status: DC | PRN
  Administered 2024-05-14: 650 mg via ORAL
  Filled 2024-05-13: qty 2

## 2024-05-13 MED ORDER — DONEPEZIL HCL 10 MG PO TABS
10.0000 mg | ORAL_TABLET | Freq: Every day | ORAL | Status: DC
  Administered 2024-05-13 – 2024-05-23 (×11): 10 mg via ORAL
  Filled 2024-05-13 (×13): qty 1

## 2024-05-13 MED ORDER — MELATONIN 5 MG PO TABS
5.0000 mg | ORAL_TABLET | Freq: Every day | ORAL | Status: DC
  Administered 2024-05-13 – 2024-05-23 (×11): 5 mg via ORAL
  Filled 2024-05-13 (×11): qty 1

## 2024-05-13 MED ORDER — MELATONIN 5 MG PO TABS: 5.0000 mg | ORAL_TABLET | Freq: Every day | ORAL | Status: DC

## 2024-05-13 MED ORDER — HYDROCHLOROTHIAZIDE 12.5 MG PO TABS
12.5000 mg | ORAL_TABLET | Freq: Every day | ORAL | Status: DC
  Administered 2024-05-14 – 2024-05-24 (×11): 12.5 mg via ORAL
  Filled 2024-05-13 (×11): qty 1

## 2024-05-13 MED ORDER — METHOCARBAMOL 750 MG PO TABS
750.0000 mg | ORAL_TABLET | Freq: Four times a day (QID) | ORAL | Status: DC | PRN
  Administered 2024-05-13 – 2024-05-20 (×6): 750 mg via ORAL
  Filled 2024-05-13 (×6): qty 1

## 2024-05-13 MED ORDER — INSULIN ASPART 100 UNIT/ML IJ SOLN
0.0000 [IU] | Freq: Three times a day (TID) | INTRAMUSCULAR | Status: DC
  Administered 2024-05-14 – 2024-05-15 (×5): 2 [IU] via SUBCUTANEOUS
  Administered 2024-05-15 – 2024-05-16 (×2): 3 [IU] via SUBCUTANEOUS
  Administered 2024-05-16: 2 [IU] via SUBCUTANEOUS
  Administered 2024-05-17 (×2): 3 [IU] via SUBCUTANEOUS
  Administered 2024-05-17 – 2024-05-19 (×5): 2 [IU] via SUBCUTANEOUS
  Administered 2024-05-20: 3 [IU] via SUBCUTANEOUS
  Administered 2024-05-20 – 2024-05-23 (×6): 2 [IU] via SUBCUTANEOUS
  Administered 2024-05-23: 5 [IU] via SUBCUTANEOUS
  Administered 2024-05-24: 3 [IU] via SUBCUTANEOUS
  Administered 2024-05-24: 2 [IU] via SUBCUTANEOUS
  Filled 2024-05-13 (×2): qty 2
  Filled 2024-05-13: qty 1
  Filled 2024-05-13: qty 3
  Filled 2024-05-13 (×5): qty 2
  Filled 2024-05-13: qty 3
  Filled 2024-05-13 (×9): qty 2
  Filled 2024-05-13 (×2): qty 3
  Filled 2024-05-13: qty 2

## 2024-05-13 NOTE — Progress Notes (Signed)
 Physical Therapy Treatment Patient Details Name: Brandon Clark MRN: [id number] DOB: 22-Jan-1941 Today's Date: 05/13/2024   History of Present Illness Pt is 83 yo male presents to therapy s/p L TKA on 04/30/2024 due to failure of conservative measures. Pt hospitalization complicated by DVT on 10/30 and PE with R heart strain requiring O2 on 10/31 with pt transferred to stepdown care. Pt PMH includes but is not limited to: dementia, s/p Nissen fundoplication procedure, hernia, OSA, BPPV, HTN, ataxia, DOE, arthritis, and B UE and LE pain.    PT Comments  POD # 13 L TKR post Op PE/DVT Pt was OOB in recliner.  Used I Glass Blower/designer to communicate to Pt.  Pt looking at Menu and shared he did not have breakfast yet. Assisted with ordering breakfast and lunch as Pt requires assistance due to Non English speaking. Attempted to amb Pt was unsuccessful.   General transfer comment: Pt required + 2 side by assist to stand from recliner with VC's on proper hand placement to push off vs pull up on walker.  Assisted with standing a total of three times and unable to amb due to increased c/o pain and dizziness.  BP 84/46 first time taken and then 99/52 second time.  Max VC's to have Pt focus eyes forward and breath.  Pt exhibiting excessive B UE support through walker due to increased pain.  RN called to room.  Per chart review, Pt has had NO pain medication thus far today.  He usually tolerates 10 mg OXY.  Pt Educated to call nurse as pain medication is PRN.  Positioned in recliner with L LE in extension and ICE MAN Applied.  Breakfast tray arrived but now Pt not hungry (due to pain).   Pt is to be D/C today to CIR.   If plan is discharge home, recommend the following: Assistance with cooking/housework;Help with stairs or ramp for entrance;Two people to help with walking and/or transfers;Two people to help with bathing/dressing/bathroom   Can travel by Patent Attorney cushion (measurements PT);Wheelchair (measurements PT)    Recommendations for Other Services Rehab consult     Precautions / Restrictions Precautions Precautions: Knee;Fall Recall of Precautions/Restrictions: Intact Precaution/Restrictions Comments: no pillow under knee Restrictions Weight Bearing Restrictions Per Provider Order: No LLE Weight Bearing Per Provider Order: Weight bearing as tolerated     Mobility  Bed Mobility               General bed mobility comments: OOB in recliner    Transfers Overall transfer level: Needs assistance Equipment used: Rolling walker (2 wheels) Transfers: Sit to/from Stand Sit to Stand: Mod assist, +2 safety/equipment           General transfer comment: Pt required + 2 side by assist to stand from recliner with VC's on proper hand placement to push off vs pull up on walker.  Assisted with standing a total of three times and unable to amb due to increased c/o pain and dizziness.  BP 84/46 first time taken and then 99/52 second time.  Max VC's to have Pt focus eyes forward and breath.  Pt exhibiting excessive B UE support through walker due to increased pain.  Per chart review, Pt has had NO pain medication thus far today.  He usually tolerates 10 mg OXY.  Pt Educated to call nurse as pain medication is PRN.    Ambulation/Gait  General Gait Details: unable due to increased pain along with dizziness updond standing.   Stairs             Wheelchair Mobility     Tilt Bed    Modified Rankin (Stroke Patients Only)       Balance                                            Communication Communication Communication: No apparent difficulties  Cognition Arousal: Alert Behavior During Therapy: WFL for tasks assessed/performed   PT - Cognitive impairments: No apparent impairments                       PT - Cognition Comments: video interpreter  utilized during session.  Pt AxO x 3 pleasant and willing. Following commands: Intact Following commands impaired: Only follows one step commands consistently    Cueing Cueing Techniques: Verbal cues  Exercises      General Comments        Pertinent Vitals/Pain Pain Assessment Pain Assessment: Faces Faces Pain Scale: Hurts worst Pain Location: L knee with mobility Pain Descriptors / Indicators: Grimacing, Guarding, Discomfort, Sore, Tender, Operative site guarding Pain Intervention(s): Monitored during session, Repositioned, Patient requesting pain meds-RN notified, Ice applied    Home Living                          Prior Function            PT Goals (current goals can now be found in the care plan section) Progress towards PT goals: Progressing toward goals    Frequency    7X/week      PT Plan      Co-evaluation              AM-PAC PT 6 Clicks Mobility   Outcome Measure  Help needed turning from your back to your side while in a flat bed without using bedrails?: A Lot Help needed moving from lying on your back to sitting on the side of a flat bed without using bedrails?: A Lot Help needed moving to and from a bed to a chair (including a wheelchair)?: A Lot Help needed standing up from a chair using your arms (e.g., wheelchair or bedside chair)?: A Lot Help needed to walk in hospital room?: A Lot Help needed climbing 3-5 steps with a railing? : Total 6 Click Score: 11    End of Session Equipment Utilized During Treatment: Gait belt Activity Tolerance: Patient limited by pain Patient left: in chair;with call bell/phone within reach;with chair alarm set;with nursing/sitter in room Nurse Communication: Mobility status PT Visit Diagnosis: Other abnormalities of gait and mobility (R26.89);Muscle weakness (generalized) (M62.81);Pain Pain - Right/Left: Left Pain - part of body: Knee     Time: 1049-1130 PT Time Calculation (min) (ACUTE  ONLY): 41 min  Charges:    $Gait Training: 8-22 mins $Therapeutic Activity: 23-37 mins PT General Charges $$ ACUTE PT VISIT: 1 Visit                    Katheryn Leap  PTA Acute  Rehabilitation Services Office M-F          [phone]

## 2024-05-13 NOTE — Progress Notes (Signed)
 Inpatient Rehab Admissions Coordinator:    I have insurance approval and a bed available for pt to admit to CIR today. Dr. Gonfa in agreement and [facility] aware.  I will notify pt/family and make arrangements.    Reche Lowers, PT, DPT Admissions Coordinator [phone] 05/13/24 9:48 AM

## 2024-05-13 NOTE — H&P (Shared)
 Physical Medicine and Rehabilitation Admission H&P     HPI: Brandon Clark is an 83 year old right-handed Arabic speaking male with history of BPH, dementia maintained on Aricept  as well as Namenda, hyperlipidemia, hypertension, sleep apnea does not use CPAP, prediabetes, BPV, hiatal hernia status post Nissen fundoplication procedure.  Per chart review patient lives with children.  Multilevel home and can reside on the main level.  Modified independent with a straight point cane prior to admission.  Presented 04/30/2024 undergoing left total knee replacement for osteoarthritis per Dr. Daldorf and weightbearing as tolerated and placed on Eliquis for DVT prophylaxis.  Postoperatively patient with dyspnea, dizziness and new oxygen requirement.  CT angio of the chest showed suspected nonocclusive pulmonary emboli in the segmental branches of the right upper lobe and additional occlusive pulmonary embolism in a segmental branch of lingular artery.  Evidence of right heart strain with RV/LV ratio 1.3.  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  Venous Doppler study showed DVT left peroneal vein.  Echocardiogram completed did not show any significant heart strain and EF of 60 to 65%.  It was initially suggested switch to heparin  drip but because of patient's faith/religion, he would not want to take pork products including heparin  thus his Eliquis was continued.  Patient did have some initial pleuritic chest pain hypoxemia has since resolved encouragement of incentive spirometer.  Therapy evaluations completed due to patient's decreased functional mobility was admitted for a comprehensive rehab program.  Review of Systems  Constitutional:  Negative for chills and fever.  HENT:  Negative for hearing loss.   Eyes:  Negative for blurred vision and double vision.  Respiratory:  Positive for shortness of breath. Negative for wheezing.   Cardiovascular:  Positive for chest pain. Negative for  palpitations and leg swelling.  Gastrointestinal:  Positive for constipation and heartburn. Negative for vomiting.       GERD  Genitourinary:  Positive for urgency. Negative for dysuria, flank pain and hematuria.  Musculoskeletal:  Positive for joint pain and myalgias.  Skin:  Negative for rash.  Psychiatric/Behavioral:  Positive for memory loss. The patient has insomnia.   All other systems reviewed and are negative.  Past Medical History:  Diagnosis Date   Acid reflux    Arthritis    BPH (benign prostatic hyperplasia)    BPPV (benign paroxysmal positional vertigo)    Dementia (HCC)    Hiatal hernia    Hyperlipidemia    Hypertension    Pre-diabetes    Sleep apnea    sometimes does not use d/t poor fitting of the mask   Ulcer    Past Surgical History:  Procedure Laterality Date   ESOPHAGEAL MANOMETRY N/A 04/27/2016   Procedure: ESOPHAGEAL MANOMETRY (EM) with PH;  Surgeon: Rogelia Copping, MD;  Location: [facility] ENDOSCOPY;  Service: Endoscopy;  Laterality: N/A;   ESOPHAGEAL MANOMETRY N/A 04/19/2017   Procedure: ESOPHAGEAL MANOMETRY (EM);  Surgeon: Dianna Specking, MD;  Location: [facility];  Service: Endoscopy;  Laterality: N/A;   FACIAL LACERATIONS REPAIR     INSERTION OF MESH N/A 06/02/2017   Procedure: INSERTION OF MESH;  Surgeon: Rubin Calamity, MD;  Location: [facility];  Service: General;  Laterality: N/A;   TOTAL KNEE ARTHROPLASTY Left 04/30/2024   Procedure: ARTHROPLASTY, KNEE, TOTAL;  Surgeon: Sheril Coy, MD;  Location: [facility];  Service: Orthopedics;  Laterality: Left;   Family History  Problem Relation Age of Onset   Diabetes Mother    Diabetes Brother    Heart attack  Neg Hx    Hyperlipidemia Neg Hx    Hypertension Neg Hx    Sudden death Neg Hx    Social History:  reports that he has never smoked. He has never used smokeless tobacco. He reports that he does not drink alcohol and does not use drugs. Allergies:  Allergies  Allergen Reactions   Ondansetron   Itching, Swelling and Other (See Comments)    Swelling of the eyes, but this is questioned in 2025.   Porcine (Pork) Protein-Containing Drug Products Other (See Comments)   Medications Prior to Admission  Medication Sig Dispense Refill   acetaminophen  (TYLENOL ) 500 MG tablet Take 500-1,000 mg by mouth every 6 (six) hours as needed.     albuterol  (VENTOLIN  HFA) 108 (90 Base) MCG/ACT inhaler Inhale 1-2 puffs into the lungs every 4 (four) hours as needed for wheezing or shortness of breath. 1 each 0   amitriptyline (ELAVIL) 25 MG tablet Take 25 mg by mouth at bedtime.     cetirizine  (ZYRTEC ) 5 MG tablet Take 1 tablet (5 mg total) by mouth daily. 30 tablet 0   diclofenac  (VOLTAREN ) 75 MG EC tablet Take 75 mg by mouth 2 (two) times daily.     donepezil  (ARICEPT ) 10 MG tablet Take 1 tablet (10 mg total) by mouth at bedtime. 30 tablet 5   finasteride  (PROSCAR ) 5 MG tablet Take 1 tablet (5 mg total) by mouth daily. 30 tablet 3   hydrochlorothiazide  (HYDRODIURIL ) 12.5 MG tablet Take 1 tablet (12.5 mg total) by mouth daily. 30 tablet 5   hydrOXYzine (ATARAX) 25 MG tablet Take 25 mg by mouth 3 (three) times daily as needed.     meclizine  (ANTIVERT ) 12.5 MG tablet Take 1 tablet (12.5 mg total) by mouth 3 (three) times daily as needed for dizziness. 30 tablet 0   memantine (NAMENDA) 10 MG tablet Take 10 mg by mouth 2 (two) times daily.     simvastatin  (ZOCOR ) 10 MG tablet Take 1 tablet (10 mg total) by mouth at bedtime. 30 tablet 5   Spacer/Aero-Holding Chambers (AEROCHAMBER PLUS) inhaler Use with inhaler 1 each 2   [DISCONTINUED] aspirin  EC 325 MG tablet Take 1 tablet (325 mg total) by mouth daily. (Patient not taking: Reported on 04/30/2024) 30 tablet 0   [DISCONTINUED] methocarbamol  (ROBAXIN ) 500 MG tablet Take 500 mg by mouth every 8 (eight) hours as needed for muscle spasms. (Patient not taking: Reported on 04/23/2024)     [DISCONTINUED] naproxen  (NAPROSYN ) 500 MG tablet Take 1 tablet (500 mg total) by  mouth 2 (two) times daily. 30 tablet 0   [DISCONTINUED] predniSONE  (DELTASONE ) 10 MG tablet Take 1 tablet (10 mg total) by mouth in the morning, at noon, and at bedtime. 15 tablet 0      Home: Home Living Family/patient expects to be discharged to:: Private residence Living Arrangements: Children Available Help at Discharge: Family, Available 24 hours/day Type of Home: House Home Access: Stairs to enter [employer] of Steps: 1 threshold Home Layout: Multi-level Alternate Level Stairs-Number of Steps: Pt typically stays upstairs but has full bath downstairs and can convert office to bedroom if needed.  Full flight of steps Alternate Level Stairs-Rails: Right Bathroom Shower/Tub: Tub/shower unit Bathroom Toilet: Standard Home Equipment: Agricultural Consultant (2 wheels), [employer] - single point, Software Engineer History: Prior Function Prior Level of Function : Independent/Modified Independent Mobility Comments: Prefers to not use cane but benefits from it per family; could ambulate in community (if holding grocery cart) ADLs Comments:  independent with adls and light iadls  Functional Status:  Mobility: Bed Mobility Overal bed mobility: Needs Assistance Bed Mobility: Sit to Supine Supine to sit: Mod assist Sit to supine: Mod assist General bed mobility comments: Pt in recliner pre/post session Transfers Overall transfer level: Needs assistance Equipment used: Rolling walker (2 wheels) Transfers: Sit to/from Stand, Bed to chair/wheelchair/BSC Sit to Stand: Mod assist, +2 safety/equipment Bed to/from chair/wheelchair/BSC transfer type:: Step pivot Step pivot transfers: Min assist, +2 physical assistance, +2 safety/equipment Transfer via Lift Equipment: [place] General transfer comment: Pt on BSC upon PT arrival intially. Assisted with transfer from [facility] to recliner, pt demonstrating NWB on L LE with use of RW. Pt request PT return after few minutes to allow for room to  freshen up to continue PT session. Pt perfomed STS x 4 during session with seated rest breaks during ambulation. pt progressed to CGA for power up to stand with use of B UE support and MOD A for RW stability with stand. Increased time to stedy in standing and to improve BOS prior to forward ambulation. Vitals following transfer from [facility] (129/86, 105bpm, 85%. Pt with some dizziness reported. Ambulation/Gait Ambulation/Gait assistance: Mod assist, +2 physical assistance, +2 safety/equipment Gait Distance (Feet): 15 Feet Assistive device: Rolling walker (2 wheels) Gait Pattern/deviations: Step-to pattern, Decreased weight shift to left, Decreased dorsiflexion - left, Decreased stance time - left, Antalgic General Gait Details: pt ambulated 45ft (121/96 (MAP104), 1063pm, 71ft, 65ft (127/92 (MAP 102), 106bpm), 70ft with seated rest breaks between. Close chair follow provided throughout. Pt initially MOD A+2 for stability demonstrating NWB on L LE. Cues for increased WB on L LE on following bouts of ambulation and pt with improved WB on L- able to progress to MIN A with +2 provided for safety. Assist for proper RW management forward with therapist's foot as 2 hands needed on pt for safety- pt with tendency to step too far in front of RW leading with L LE leading to posterior leaning- multimodal cuing for proper body position in RW. Pt reporting improvements in dizziness during session. Vitals end of session 141/81 (99). Pt moving UE being assessed required repeated cues for relaxing during vitals assessment. Gait velocity: decreased    ADL: ADL Overall ADL's : Needs assistance/impaired Eating/Feeding: Set up, Sitting Grooming: Wash/dry face, Set up, Sitting Upper Body Bathing: Set up, Sitting Lower Body Bathing: Maximal assistance, Sitting/lateral leans, +2 for safety/equipment Upper Body Dressing : Minimal assistance, Sitting Lower Body Dressing: Total assistance, Sitting/lateral leans Toilet Transfer:  Cueing for safety, Cueing for sequencing, Moderate assistance, +2 for safety/equipment (platform walker) Toilet Transfer Details (indicate cue type and reason): Simulated toilet t/f into the recliner Toileting- Clothing Manipulation and Hygiene: Maximal assistance, +2 for safety/equipment, Sit to/from stand Functional mobility during ADLs: Moderate assistance, +2 for safety/equipment, +2 for physical assistance (platform walker) General ADL Comments: TOTAL don/doff LB dressing items, MODA+2 simulated toilet transfer into recliner  Cognition: Cognition Orientation Level: Oriented X4 Cognition Arousal: Alert Behavior During Therapy: WFL for tasks assessed/performed  Physical Exam: Blood pressure 106/76, pulse 81, temperature 97.7 F (36.5 C), temperature source Oral, resp. rate 18, height 6' (1.829 m), weight 122 kg, SpO2 100%. Physical Exam Neurological:     Comments: Patient is alert and makes eye contact with examiner.  Follows simple demonstrated commands.  There is a language barrier.     Results for orders placed or performed during the hospital encounter of 04/30/24 (from the past 48 hours)  Glucose, capillary  Status: Abnormal   Collection Time: 05/11/24  8:09 AM  Result Value Ref Range   Glucose-Capillary 138 (H) 70 - 99 mg/dL    Comment: Glucose reference range applies only to samples taken after fasting for at least 8 hours.  Glucose, capillary     Status: Abnormal   Collection Time: 05/11/24  1:32 PM  Result Value Ref Range   Glucose-Capillary 126 (H) 70 - 99 mg/dL    Comment: Glucose reference range applies only to samples taken after fasting for at least 8 hours.  Glucose, capillary     Status: Abnormal   Collection Time: 05/11/24  5:13 PM  Result Value Ref Range   Glucose-Capillary 122 (H) 70 - 99 mg/dL    Comment: Glucose reference range applies only to samples taken after fasting for at least 8 hours.  Glucose, capillary     Status: Abnormal   Collection Time:  05/11/24  9:02 PM  Result Value Ref Range   Glucose-Capillary 136 (H) 70 - 99 mg/dL    Comment: Glucose reference range applies only to samples taken after fasting for at least 8 hours.  Glucose, capillary     Status: Abnormal   Collection Time: 05/12/24  7:46 AM  Result Value Ref Range   Glucose-Capillary 141 (H) 70 - 99 mg/dL    Comment: Glucose reference range applies only to samples taken after fasting for at least 8 hours.  Glucose, capillary     Status: Abnormal   Collection Time: 05/12/24 11:38 AM  Result Value Ref Range   Glucose-Capillary 150 (H) 70 - 99 mg/dL    Comment: Glucose reference range applies only to samples taken after fasting for at least 8 hours.  Glucose, capillary     Status: Abnormal   Collection Time: 05/12/24  4:41 PM  Result Value Ref Range   Glucose-Capillary 137 (H) 70 - 99 mg/dL    Comment: Glucose reference range applies only to samples taken after fasting for at least 8 hours.  Glucose, capillary     Status: Abnormal   Collection Time: 05/12/24  9:05 PM  Result Value Ref Range   Glucose-Capillary 143 (H) 70 - 99 mg/dL    Comment: Glucose reference range applies only to samples taken after fasting for at least 8 hours.  Comprehensive metabolic panel with GFR     Status: Abnormal   Collection Time: 05/13/24  4:55 AM  Result Value Ref Range   Sodium 136 135 - 145 mmol/L   Potassium 4.5 3.5 - 5.1 mmol/L   Chloride 99 98 - 111 mmol/L   CO2 30 22 - 32 mmol/L   Glucose, Bld 133 (H) 70 - 99 mg/dL    Comment: Glucose reference range applies only to samples taken after fasting for at least 8 hours.   BUN 19 8 - 23 mg/dL   Creatinine, Ser 9.09 0.61 - 1.24 mg/dL   Calcium 9.4 8.9 - 89.6 mg/dL   Total Protein 6.4 (L) 6.5 - 8.1 g/dL   Albumin 3.4 (L) 3.5 - 5.0 g/dL   AST 76 (H) 15 - 41 U/L   ALT 85 (H) 0 - 44 U/L   Alkaline Phosphatase 274 (H) 38 - 126 U/L   Total Bilirubin 1.7 (H) 0.0 - 1.2 mg/dL   GFR, Estimated >39 >39 mL/min    Comment:  (NOTE) Calculated using the CKD-EPI Creatinine Equation (2021)    Anion gap 7 5 - 15    Comment: Performed at [facility],  [address] [place], [place] [address]  CBC     Status: Abnormal   Collection Time: 05/13/24  4:55 AM  Result Value Ref Range   WBC 7.6 4.0 - 10.5 K/uL   RBC 3.89 (L) 4.22 - 5.81 MIL/uL   Hemoglobin 11.2 (L) 13.0 - 17.0 g/dL   HCT 64.8 (L) 60.9 - 47.9 %   MCV 90.2 80.0 - 100.0 fL   MCH 28.8 26.0 - 34.0 pg   MCHC 31.9 30.0 - 36.0 g/dL   RDW 86.7 88.4 - 84.4 %   Platelets 427 (H) 150 - 400 K/uL   nRBC 0.0 0.0 - 0.2 %    Comment: Performed at [facility], [address], [place], [place] [address]  Magnesium      Status: None   Collection Time: 05/13/24  4:55 AM  Result Value Ref Range   Magnesium  2.2 1.7 - 2.4 mg/dL    Comment: Performed at [facility], [address], [place], [place] [address]  Vitamin B12     Status: Abnormal   Collection Time: 05/13/24  4:55 AM  Result Value Ref Range   Vitamin B-12 2,334 (H) 180 - 914 pg/mL    Comment: Performed at [facility], [address] [place], [place] [address]  Folate     Status: None   Collection Time: 05/13/24  4:55 AM  Result Value Ref Range   Folate >20.0 >5.9 ng/mL    Comment: Performed at [facility], [address], [place], [place] [address]  Iron and TIBC     Status: Abnormal   Collection Time: 05/13/24  4:55 AM  Result Value Ref Range   Iron 39 (L) 45 - 182 ug/dL   TIBC 762 (L) 749 - 549 ug/dL   Saturation Ratios 17 (L) 17.9 - 39.5 %   UIBC 197 ug/dL    Comment: Performed at [facility], [address], [place], [place] [address]  Ferritin     Status: Abnormal   Collection Time: 05/13/24  4:55 AM  Result Value Ref Range   Ferritin 878 (H) 24 - 336 ng/mL    Comment: Performed at [facility], [address] [place], [place] [address]  Reticulocytes      Status: Abnormal   Collection Time: 05/13/24  4:55 AM  Result Value Ref Range   Retic Ct Pct 3.3 (H) 0.4 - 3.1 %   RBC. 4.00 (L) 4.22 - 5.81 MIL/uL   Retic Count, Absolute 132.0 19.0 - 186.0 K/uL   Immature Retic Fract 25.8 (H) 2.3 - 15.9 %    Comment: Performed at [facility], [address], [place], [place] [address]   No results found.    Blood pressure 106/76, pulse 81, temperature 97.7 F (36.5 C), temperature source Oral, resp. rate 18, height 6' (1.829 m), weight 122 kg, SpO2 100%.  Medical Problem List and Plan: 1. 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  Weightbearing as tolerated  -patient may *** shower  -ELOS/Goals: *** 2.  Antithrombotics: -DVT/anticoagulation:  Pharmaceutical: Eliquis  -antiplatelet therapy: N/A 3. Pain Management:  Robaxin  and oxycodone  as needed 4. Mood/Behavior/Sleep/dementia: Elavil 25 mg nightly, Namenda 10 mg twice daily, melatonin 5 mg nightly, Aricept  10 mg nightly trazodone  50 mg nightly as needed  -antipsychotic agents: N/A 5. Neuropsych/cognition: This patient is capable of making decisions on his own behalf. 6. Skin/Wound Care: Routine skin checks  7. Fluids/Electrolytes/Nutrition: Routine ins and outs with follow-up chemistries 8.  Hypertension.  HCTZ 12.5 mg daily.  Monitor with increased mobility 9.  BPH.  Proscar  5 mg daily 10.  Hyperlipidemia.  Zocor  11.  Prediabetes.  Hemoglobin A1c 5.9.  SSI 12.  OSA.  Patient does not use CPAP.  Check oxygen saturations every shift 13.  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  Dietary follow-up 14.  Constipation.  MiraLAX twice daily, Colace 100 mg twice daily Toribio JINNY Pitch, PA-C 05/13/2024

## 2024-05-13 NOTE — Discharge Instructions (Addendum)
 Inpatient Rehab Discharge Instructions  Brandon Clark Discharge date and time: No discharge date for patient encounter.   Activities/Precautions/ Functional Status: Activity: As tolerated Diet: Diabetic diet Wound Care: Routine skin checks Functional status:  ___ No restrictions     ___ Walk up steps independently ___ 24/7 supervision/assistance   ___ Walk up steps with assistance ___ Intermittent supervision/assistance  ___ Bathe/dress independently ___ Walk with walker     __x_ Bathe/dress with assistance ___ Walk Independently    ___ Shower independently ___ Walk with assistance    ___ Shower with assistance ___ No alcohol     ___ Return to work/school ________  Special Instructions: No driving smoking or alcohol  COMMUNITY REFERRALS UPON DISCHARGE:    Outpatient: PT     OT                Agency: [facility] (formerly [employer]) [address], [place], [place] [address]  Phone: [phone]               Appointment Date/Time: 05/28/24 at 2:40 PM   Medical Equipment/Items Ordered: 20 x 18 manual wheelchair, RW with 5 in wheels, SPC, 3 in 1 commode, and cold therapy, TTB (to purchase out of pocket)                                                  Agency/Supplier: [facility]    My questions have been answered and I understand these instructions. I will adhere to these goals and the provided educational materials after my discharge from the hospital.  Patient/Caregiver Signature _______________________________ Date __________  Clinician Signature _______________________________________ Date __________  Please bring this form and your medication list with you to all your follow-up doctor's appointments.   Information on my medicine - ELIQUIS  (apixaban )  This medication education was reviewed with me or my healthcare representative as part of my discharge preparation.  The pharmacist that spoke with me during my hospital stay was:  Madueme,  Gregorio Larry, RPH  Why was Eliquis  prescribed for you? Eliquis  was prescribed for you to reduce the risk of forming blood clots that can cause a stroke if you have a medical condition called atrial fibrillation (a type of irregular heartbeat) OR to reduce the risk of a blood clots forming after orthopedic surgery.  What do You need to know about Eliquis  ? Take your Eliquis  TWICE DAILY - one tablet in the morning and one tablet in the evening with or without food.  It would be best to take the doses about the same time each day.  If you have difficulty swallowing the tablet whole please discuss with your pharmacist how to take the medication safely.  Take Eliquis  exactly as prescribed by your doctor and DO NOT stop taking Eliquis  without talking to the doctor who prescribed the medication.  Stopping may increase your risk of developing a new clot or stroke.  Refill your prescription before you run out.  After discharge, you should have regular check-up appointments with your healthcare provider that is prescribing your Eliquis .  In the future your dose may need to be changed if your kidney function or weight changes by a significant amount or as you get older.  What do you do if you miss a dose? If you miss a dose, take it as soon as you remember on the  same day and resume taking twice daily.  Do not take more than one dose of ELIQUIS  at the same time.  Important Safety Information A possible side effect of Eliquis  is bleeding. You should call your healthcare provider right away if you experience any of the following: Bleeding from an injury or your nose that does not stop. Unusual colored urine (red or dark brown) or unusual colored stools (red or black). Unusual bruising for unknown reasons. A serious fall or if you hit your head (even if there is no bleeding).  Some medicines may interact with Eliquis  and might increase your risk of bleeding or clotting while on Eliquis . To help  avoid this, consult your healthcare provider or pharmacist prior to using any new prescription or non-prescription medications, including herbals, vitamins, non-steroidal anti-inflammatory drugs (NSAIDs) and supplements.  This website has more information on Eliquis  (apixaban ): [email or web address] .com.

## 2024-05-13 NOTE — Evaluation (Signed)
 Speech Language Pathology Assessment and Plan  Patient Details  Name: CLAUDIO MONDRY MRN: [id number] Date of Birth: 1941-05-19  SLP Diagnosis:   n/a Rehab Potential:   n/a ELOS:   n/a   Today's Date: 05/14/2024 SLP Individual Time: 1000-1056 SLP Individual Time Calculation (min): 56 min   Hospital Problem: Principal Problem:   Pulmonary emboli (HCC)  Past Medical History:  Past Medical History:  Diagnosis Date   Acid reflux    Arthritis    BPH (benign prostatic hyperplasia)    BPPV (benign paroxysmal positional vertigo)    Dementia (HCC)    Hiatal hernia    Hyperlipidemia    Hypertension    Pre-diabetes    Sleep apnea    sometimes does not use d/t poor fitting of the mask   Ulcer    Past Surgical History:  Past Surgical History:  Procedure Laterality Date   ESOPHAGEAL MANOMETRY N/A 04/27/2016   Procedure: ESOPHAGEAL MANOMETRY (EM) with PH;  Surgeon: Rogelia Copping, MD;  Location: [facility] ENDOSCOPY;  Service: Endoscopy;  Laterality: N/A;   ESOPHAGEAL MANOMETRY N/A 04/19/2017   Procedure: ESOPHAGEAL MANOMETRY (EM);  Surgeon: Dianna Specking, MD;  Location: [facility] ENDOSCOPY;  Service: Endoscopy;  Laterality: N/A;   FACIAL LACERATIONS REPAIR     INSERTION OF MESH N/A 06/02/2017   Procedure: INSERTION OF MESH;  Surgeon: Rubin Calamity, MD;  Location: [facility] ORS;  Service: General;  Laterality: N/A;   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 He underwent TKA per Dr. Dalldorf on 10/28 and is WBAT. Post operatively he developed dyspnea,  dizziness, and new O2 requirement. Hospitalist was consulted and workup revealed acute left peroneal vein DVT on 10/30 and was started on eliquis. CTA chest showed acute PE with right heart strain and eliquis was continued. TRH had initially signed off but on 11/5 pt developed sudden onset chest pain which was pressure like in nature and 6/10 in intensity so they were reconsulted. Workup was negative for acute process. Therapy ongoing and pt was recommended for CIR.   Cognitive-Linguistic Evaluation: Patient presents with cognitive linguistic function that appears at baseline level per patient report, though note patient presenting with dementia diagnosis and no family present to corroborate patient-provided information. SLP completed SLUMS examination where patient demonstrated trace to mild deficits in the areas of short term recall, working memory, problem solving, and visuospatial construction/conceptualization, however again, patient endorses that this is all normal for him prior to current hospitalization. During session, patient referenced schedule and adequate organizational skills to manage sessions and other daily events. Patient exhibits no overt deficits in the areas of receptive/expressive language nor motor speech. No further SLP services indicated at this time. SLP will sign off.   Skilled Therapeutic Interventions          SLP conducted skilled evaluation session to assess cognitive-linguistic function. Utilized SLUMS and patient and/or family interview. Full results above.    SLP Assessment  Patient does not need any further Speech Lanaguage Pathology Services    Recommendations  Oral Care Recommendations: Oral  care BID Patient destination: Home Follow up Recommendations: None Equipment Recommended: None recommended by SLP    SLP Frequency   N/a  SLP Duration  SLP Intensity  SLP Treatment/Interventions   N/a    N/a    N/a   Pain Pain Assessment Pain Scale: Faces Pain  Score: 10-Worst pain ever Faces Pain Scale: No hurt Pain Location: Leg Pain Orientation: Left Pain Intervention(s): Medication (See eMAR)  Prior Functioning Cognitive/Linguistic Baseline: Baseline deficits Baseline deficit details: dementia diagnosis, no family present to corroborate deficits noted at home Type of Home: House  Lives With: Son Available Help at Discharge: Family;Available PRN/intermittently Vocation: Full time employment  SLP Evaluation Cognition Overall Cognitive Status: History of cognitive impairments - at baseline Arousal/Alertness: Awake/alert Orientation Level: Oriented X4 Year: 2025 Month: November Day of Week: Correct Attention: Sustained Sustained Attention: Appears intact Memory: Impaired Memory Impairment: Retrieval deficit;Decreased short term memory Decreased Short Term Memory: Verbal complex;Functional complex Awareness: Impaired Awareness Impairment: Emergent impairment Problem Solving: Impaired Problem Solving Impairment: Verbal complex;Functional complex Safety/Judgment: Appears intact  Comprehension Auditory Comprehension Overall Auditory Comprehension: Appears within functional limits for tasks assessed Expression Expression Primary Mode of Expression: Verbal Verbal Expression Overall Verbal Expression: Appears within functional limits for tasks assessed Written Expression Dominant Hand: Right Oral Motor Oral Motor/Sensory Function Overall Oral Motor/Sensory Function: Within functional limits Motor Speech Overall Motor Speech: Appears within functional limits for tasks assessed Respiration: Within functional limits Resonance: Within functional limits Articulation: Within functional limitis Intelligibility: Intelligible Motor Planning: Within functional limits Motor Speech Errors: Not applicable  Care Tool Care Tool Cognition Ability to hear (with hearing aid or hearing appliances if normally used Ability to hear (with hearing  aid or hearing appliances if normally used): 0. Adequate - no difficulty in normal conservation, social interaction, listening to TV   Expression of Ideas and Wants Expression of Ideas and Wants: 4. Without difficulty (complex and basic) - expresses complex messages without difficulty and with speech that is clear and easy to understand   Understanding Verbal and Non-Verbal Content Understanding Verbal and Non-Verbal Content: 4. Understands (complex and basic) - clear comprehension without cues or repetitions  Memory/Recall Ability Memory/Recall Ability : Staff names and faces   Intelligibility: Intelligible  The above assessment, treatment plan, treatment alternatives and goals were discussed and mutually agreed upon: by patient  Thayne Cindric, M.A., CCC-SLP  Kamoria Lucien A Kelvis Berger 05/14/2024, 1:12 PM

## 2024-05-13 NOTE — Progress Notes (Signed)
 Patient being transferred to CIR via [facility], report given to receiving RN

## 2024-05-13 NOTE — TOC Transition Note (Signed)
 Transition of Care [facility]) - Discharge Note   Patient Details  Name: Brandon Clark MRN: [id number] Date of Birth: 12-Aug-1940  [facility] CM/SW Contact:  Jon ONEIDA Anon, RN Phone Number: 05/13/2024, 11:14 AM   Clinical Narrative:    Pt to discharge to [facility]. There are no further IP CM needs at this time.    Final next level of care: IP Rehab Facility Barriers to Discharge: Barriers Resolved   Patient Goals and CMS Choice Patient states their goals for this hospitalization and ongoing recovery are:: To [facility] CMS Medicare.gov Compare Post Acute Care list provided to:: Patient Choice offered to / list presented to : Patient [place] ownership interest in [facility].provided to:: Patient    Discharge Placement              Patient chooses bed at:  [facility]) Patient to be transferred to facility by: [facility]      Discharge Plan and Services Additional resources added to the After Visit Summary for                  DME Arranged: N/A DME Agency: NA       HH Arranged: NA HH Agency: NA        Social Drivers of Health (SDOH) Interventions SDOH Screenings   Food Insecurity: Unknown (04/30/2024)  Housing: Unknown (04/30/2024)  Transportation Needs: Unknown (04/30/2024)  Utilities: Patient Declined (04/30/2024)  Depression (PHQ2-9): Low Risk  (10/16/2019)  Financial Resource Strain: Low Risk  (10/27/2023)   Received from [facility]  Physical Activity: Unknown (10/27/2023)   Received from [facility]  Social Connections: Patient Declined (04/30/2024)  Stress: No Stress Concern Present (10/27/2023)   Received from [facility]  Tobacco Use: Low Risk  (04/30/2024)     Readmission Risk Interventions     No data to display

## 2024-05-13 NOTE — Progress Notes (Signed)
 Inpatient Rehabilitation Admission Medication Review by a Pharmacist  A complete drug regimen review was completed for this patient to identify any potential clinically significant medication issues.  High Risk Drug Classes Is patient taking? Indication by Medication  Antipsychotic No   Anticoagulant Yes Apixaban - PE, DVT  Antibiotic No   Opioid Yes Percocet - prn pain  Antiplatelet No   Hypoglycemics/insulin Yes Insulin - SSI, DM  Vasoactive Medication Yes Hydrochlorothiazide  - BP  Chemotherapy No   Other Yes Methocarbamol  prn muscle spasms  Amitriptyline - sleep, mood Donepezil , memantine - dementia Finasteride  - BPH Simvastatin  - HLD Trazodone  - sleep     Type of Medication Issue Identified Description of Issue Recommendation(s)  Drug Interaction(s) (clinically significant)     Duplicate Therapy     Allergy     No Medication Administration End Date     Incorrect Dose     Additional Drug Therapy Needed     Significant med changes from prior encounter (inform family/care partners about these prior to discharge). Metformin - resume if and when appropriate   Other       Clinically significant medication issues were identified that warrant physician communication and completion of prescribed/recommended actions by midnight of the next day:  No  Name of provider notified for urgent issues identified:   Provider Method of Notification:     Pharmacist comments: None  Time spent performing this drug regimen review (minutes):  20 minutes  Larraine Brazier, PharmD Clinical Pharmacist 05/13/2024  3:42 PM **Pharmacist phone directory can now be found on [email or web address] ([email or web address]).  Listed under [facility].

## 2024-05-13 NOTE — H&P (Signed)
 Physical Medicine and Rehabilitation Admission H&P       HPI: Brandon Clark is an 83 year old right-handed Arabic speaking male with history of BPH, dementia maintained on Aricept  as well as Namenda, hyperlipidemia, hypertension, sleep apnea does not use CPAP, prediabetes, BPV, hiatal hernia status post Nissen fundoplication procedure.  Per chart review patient lives with children.  Multilevel home and can reside on the main level.  Modified independent with a straight point cane prior to admission.  Presented 04/30/2024 undergoing left total knee replacement for osteoarthritis per Dr. Daldorf and weightbearing as tolerated and placed on Eliquis for DVT prophylaxis.  Postoperatively patient with dyspnea, dizziness and new oxygen requirement.  CT angio of the chest showed suspected nonocclusive pulmonary emboli in the segmental branches of the right upper lobe and additional occlusive pulmonary embolism in a segmental branch of lingular artery.  Evidence of right heart strain with RV/LV ratio 1.3.  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  Venous Doppler study showed DVT left peroneal vein.  Echocardiogram completed did not show any significant heart strain and EF of 60 to 65%.  It was initially suggested switch to heparin  drip but because of patient's faith/religion, he would not want to take pork products including heparin  thus his Eliquis was continued.  Patient did have some initial pleuritic chest pain hypoxemia has since resolved encouragement of incentive spirometer.  Therapy evaluations completed due to patient's decreased functional mobility was admitted for a comprehensive rehab program.   Review of Systems  Constitutional:  Negative for chills and fever.  HENT:  Negative for hearing loss.   Eyes:  Negative for blurred vision and double vision.  Respiratory:  Positive for shortness of breath. Negative for wheezing.   Cardiovascular:  Positive for chest pain. Negative for palpitations  and leg swelling.  Gastrointestinal:  Positive for constipation and heartburn. Negative for vomiting.       GERD  Genitourinary:  Positive for urgency. Negative for dysuria, flank pain and hematuria.  Musculoskeletal:  Positive for joint pain and myalgias.  Skin:  Negative for rash.  Psychiatric/Behavioral:  Positive for memory loss. The patient has insomnia.   All other systems reviewed and are negative.      Past Medical History:  Diagnosis Date   Acid reflux     Arthritis     BPH (benign prostatic hyperplasia)     BPPV (benign paroxysmal positional vertigo)     Dementia (HCC)     Hiatal hernia     Hyperlipidemia     Hypertension     Pre-diabetes     Sleep apnea      sometimes does not use d/t poor fitting of the mask   Ulcer               Past Surgical History:  Procedure Laterality Date   ESOPHAGEAL MANOMETRY N/A 04/27/2016    Procedure: ESOPHAGEAL MANOMETRY (EM) with PH;  Surgeon: Rogelia Copping, MD;  Location: [facility];  Service: Endoscopy;  Laterality: N/A;   ESOPHAGEAL MANOMETRY N/A 04/19/2017    Procedure: ESOPHAGEAL MANOMETRY (EM);  Surgeon: Dianna Specking, MD;  Location: [facility];  Service: Endoscopy;  Laterality: N/A;   FACIAL LACERATIONS REPAIR       INSERTION OF MESH N/A 06/02/2017    Procedure: INSERTION OF MESH;  Surgeon: Rubin Calamity, MD;  Location: [facility];  Service: General;  Laterality: N/A;   TOTAL KNEE ARTHROPLASTY Left 04/30/2024    Procedure: ARTHROPLASTY, KNEE, TOTAL;  Surgeon: Sheril Coy, MD;  Location: [facility];  Service: Orthopedics;  Laterality: Left;             Family History  Problem Relation Age of Onset   Diabetes Mother     Diabetes Brother     Heart attack Neg Hx     Hyperlipidemia Neg Hx     Hypertension Neg Hx     Sudden death Neg Hx          Social History:  reports that he has never smoked. He has never used smokeless tobacco. He reports that he does not drink alcohol and does not use drugs. Allergies:   Allergies       Allergies  Allergen Reactions   Ondansetron  Itching, Swelling and Other (See Comments)      Swelling of the eyes, but this is questioned in 2025.   Porcine (Pork) Protein-Containing Drug Products Other (See Comments)            Medications Prior to Admission  Medication Sig Dispense Refill   acetaminophen  (TYLENOL ) 500 MG tablet Take 500-1,000 mg by mouth every 6 (six) hours as needed.       albuterol  (VENTOLIN  HFA) 108 (90 Base) MCG/ACT inhaler Inhale 1-2 puffs into the lungs every 4 (four) hours as needed for wheezing or shortness of breath. 1 each 0   amitriptyline (ELAVIL) 25 MG tablet Take 25 mg by mouth at bedtime.       cetirizine  (ZYRTEC ) 5 MG tablet Take 1 tablet (5 mg total) by mouth daily. 30 tablet 0   diclofenac  (VOLTAREN ) 75 MG EC tablet Take 75 mg by mouth 2 (two) times daily.       donepezil  (ARICEPT ) 10 MG tablet Take 1 tablet (10 mg total) by mouth at bedtime. 30 tablet 5   finasteride  (PROSCAR ) 5 MG tablet Take 1 tablet (5 mg total) by mouth daily. 30 tablet 3   hydrochlorothiazide  (HYDRODIURIL ) 12.5 MG tablet Take 1 tablet (12.5 mg total) by mouth daily. 30 tablet 5   hydrOXYzine (ATARAX) 25 MG tablet Take 25 mg by mouth 3 (three) times daily as needed.       meclizine  (ANTIVERT ) 12.5 MG tablet Take 1 tablet (12.5 mg total) by mouth 3 (three) times daily as needed for dizziness. 30 tablet 0   memantine (NAMENDA) 10 MG tablet Take 10 mg by mouth 2 (two) times daily.       simvastatin  (ZOCOR ) 10 MG tablet Take 1 tablet (10 mg total) by mouth at bedtime. 30 tablet 5   Spacer/Aero-Holding Chambers (AEROCHAMBER PLUS) inhaler Use with inhaler 1 each 2   [DISCONTINUED] aspirin  EC 325 MG tablet Take 1 tablet (325 mg total) by mouth daily. (Patient not taking: Reported on 04/30/2024) 30 tablet 0   [DISCONTINUED] methocarbamol  (ROBAXIN ) 500 MG tablet Take 500 mg by mouth every 8 (eight) hours as needed for muscle spasms. (Patient not taking: Reported on  04/23/2024)       [DISCONTINUED] naproxen  (NAPROSYN ) 500 MG tablet Take 1 tablet (500 mg total) by mouth 2 (two) times daily. 30 tablet 0   [DISCONTINUED] predniSONE  (DELTASONE ) 10 MG tablet Take 1 tablet (10 mg total) by mouth in the morning, at noon, and at bedtime. 15 tablet 0              Home: Home Living Family/patient expects to be discharged to:: Private residence Living Arrangements: Children Available Help at Discharge: Family, Available 24 hours/day Type of Home: House Home Access: Stairs to enter [employer] of  Steps: 1 threshold Home Layout: Multi-level Alternate Level Stairs-Number of Steps: Pt typically stays upstairs but has full bath downstairs and can convert office to bedroom if needed.  Full flight of steps Alternate Level Stairs-Rails: Right Bathroom Shower/Tub: Tub/shower unit Bathroom Toilet: Standard Home Equipment: Agricultural Consultant (2 wheels), [employer] - single point, Software Engineer History: Prior Function Prior Level of Function : Independent/Modified Independent Mobility Comments: Prefers to not use cane but benefits from it per family; could ambulate in community (if holding grocery cart) ADLs Comments: independent with adls and light iadls   Functional Status:  Mobility: Bed Mobility Overal bed mobility: Needs Assistance Bed Mobility: Sit to Supine Supine to sit: Mod assist Sit to supine: Mod assist General bed mobility comments: Pt in recliner pre/post session Transfers Overall transfer level: Needs assistance Equipment used: Rolling walker (2 wheels) Transfers: Sit to/from Stand, Bed to chair/wheelchair/BSC Sit to Stand: Mod assist, +2 safety/equipment Bed to/from chair/wheelchair/BSC transfer type:: Step pivot Step pivot transfers: Min assist, +2 physical assistance, +2 safety/equipment Transfer via Lift Equipment: [place] General transfer comment: Pt on BSC upon PT arrival intially. Assisted with transfer from [facility] to  recliner, pt demonstrating NWB on L LE with use of RW. Pt request PT return after few minutes to allow for room to freshen up to continue PT session. Pt perfomed STS x 4 during session with seated rest breaks during ambulation. pt progressed to CGA for power up to stand with use of B UE support and MOD A for RW stability with stand. Increased time to stedy in standing and to improve BOS prior to forward ambulation. Vitals following transfer from [facility] (129/86, 105bpm, 85%. Pt with some dizziness reported. Ambulation/Gait Ambulation/Gait assistance: Mod assist, +2 physical assistance, +2 safety/equipment Gait Distance (Feet): 15 Feet Assistive device: Rolling walker (2 wheels) Gait Pattern/deviations: Step-to pattern, Decreased weight shift to left, Decreased dorsiflexion - left, Decreased stance time - left, Antalgic General Gait Details: pt ambulated 69ft (121/96 (MAP104), 1063pm, 52ft, 45ft (127/92 (MAP 102), 106bpm), 62ft with seated rest breaks between. Close chair follow provided throughout. Pt initially MOD A+2 for stability demonstrating NWB on L LE. Cues for increased WB on L LE on following bouts of ambulation and pt with improved WB on L- able to progress to MIN A with +2 provided for safety. Assist for proper RW management forward with therapist's foot as 2 hands needed on pt for safety- pt with tendency to step too far in front of RW leading with L LE leading to posterior leaning- multimodal cuing for proper body position in RW. Pt reporting improvements in dizziness during session. Vitals end of session 141/81 (99). Pt moving UE being assessed required repeated cues for relaxing during vitals assessment. Gait velocity: decreased   ADL: ADL Overall ADL's : Needs assistance/impaired Eating/Feeding: Set up, Sitting Grooming: Wash/dry face, Set up, Sitting Upper Body Bathing: Set up, Sitting Lower Body Bathing: Maximal assistance, Sitting/lateral leans, +2 for safety/equipment Upper Body  Dressing : Minimal assistance, Sitting Lower Body Dressing: Total assistance, Sitting/lateral leans Toilet Transfer: Cueing for safety, Cueing for sequencing, Moderate assistance, +2 for safety/equipment (platform walker) Toilet Transfer Details (indicate cue type and reason): Simulated toilet t/f into the recliner Toileting- Clothing Manipulation and Hygiene: Maximal assistance, +2 for safety/equipment, Sit to/from stand Functional mobility during ADLs: Moderate assistance, +2 for safety/equipment, +2 for physical assistance (platform walker) General ADL Comments: TOTAL don/doff LB dressing items, MODA+2 simulated toilet transfer into recliner   Cognition: Cognition Orientation Level: Oriented  X4 Cognition Arousal: Alert Behavior During Therapy: WFL for tasks assessed/performed   Physical Exam: Blood pressure 106/76, pulse 81, temperature 97.7 F (36.5 C), temperature source Oral, resp. rate 18, height 6' (1.829 m), weight 122 kg, SpO2 100%. Physical Exam Arabic interpreter utilized using iPad Neurological:     Comments: Patient is alert and makes eye contact with examiner.  Follows simple demonstrated commands.  There is a language barrier.   General: No acute distress Mood and affect are appropriate Heart: Regular rate and rhythm no rubs murmurs or extra sounds Lungs: Clear to auscultation, breathing unlabored, no rales or wheezes Abdomen: Positive bowel sounds, soft nontender to palpation, nondistended Extremities: No clubbing, cyanosis, or edema Skin: No evidence of breakdown, no evidence of rash Neurologic: Cranial nerves II through XII intact, motor strength is 5/5 in bilateral deltoid, bicep, tricep, grip, right hip flexor, knee extensors, ankle dorsiflexor and plantar flexor 3- Left Hip flex, knee ext 4/5  L ankle PF/DF  Musculoskeletal: Full range of motion in all 4 extremities. No joint swelling except at left knee, there is diffuse swelling in the left calf with  ecchymosis.  Left knee incision is covered by postoperative dressing     Lab Results Last 48 Hours        Results for orders placed or performed during the hospital encounter of 04/30/24 (from the past 48 hours)  Glucose, capillary     Status: Abnormal    Collection Time: 05/11/24  8:09 AM  Result Value Ref Range    Glucose-Capillary 138 (H) 70 - 99 mg/dL      Comment: Glucose reference range applies only to samples taken after fasting for at least 8 hours.  Glucose, capillary     Status: Abnormal    Collection Time: 05/11/24  1:32 PM  Result Value Ref Range    Glucose-Capillary 126 (H) 70 - 99 mg/dL      Comment: Glucose reference range applies only to samples taken after fasting for at least 8 hours.  Glucose, capillary     Status: Abnormal    Collection Time: 05/11/24  5:13 PM  Result Value Ref Range    Glucose-Capillary 122 (H) 70 - 99 mg/dL      Comment: Glucose reference range applies only to samples taken after fasting for at least 8 hours.  Glucose, capillary     Status: Abnormal    Collection Time: 05/11/24  9:02 PM  Result Value Ref Range    Glucose-Capillary 136 (H) 70 - 99 mg/dL      Comment: Glucose reference range applies only to samples taken after fasting for at least 8 hours.  Glucose, capillary     Status: Abnormal    Collection Time: 05/12/24  7:46 AM  Result Value Ref Range    Glucose-Capillary 141 (H) 70 - 99 mg/dL      Comment: Glucose reference range applies only to samples taken after fasting for at least 8 hours.  Glucose, capillary     Status: Abnormal    Collection Time: 05/12/24 11:38 AM  Result Value Ref Range    Glucose-Capillary 150 (H) 70 - 99 mg/dL      Comment: Glucose reference range applies only to samples taken after fasting for at least 8 hours.  Glucose, capillary     Status: Abnormal    Collection Time: 05/12/24  4:41 PM  Result Value Ref Range    Glucose-Capillary 137 (H) 70 - 99 mg/dL      Comment: Glucose  reference range applies only  to samples taken after fasting for at least 8 hours.  Glucose, capillary     Status: Abnormal    Collection Time: 05/12/24  9:05 PM  Result Value Ref Range    Glucose-Capillary 143 (H) 70 - 99 mg/dL      Comment: Glucose reference range applies only to samples taken after fasting for at least 8 hours.  Comprehensive metabolic panel with GFR     Status: Abnormal    Collection Time: 05/13/24  4:55 AM  Result Value Ref Range    Sodium 136 135 - 145 mmol/L    Potassium 4.5 3.5 - 5.1 mmol/L    Chloride 99 98 - 111 mmol/L    CO2 30 22 - 32 mmol/L    Glucose, Bld 133 (H) 70 - 99 mg/dL      Comment: Glucose reference range applies only to samples taken after fasting for at least 8 hours.    BUN 19 8 - 23 mg/dL    Creatinine, Ser 9.09 0.61 - 1.24 mg/dL    Calcium 9.4 8.9 - 89.6 mg/dL    Total Protein 6.4 (L) 6.5 - 8.1 g/dL    Albumin 3.4 (L) 3.5 - 5.0 g/dL    AST 76 (H) 15 - 41 U/L    ALT 85 (H) 0 - 44 U/L    Alkaline Phosphatase 274 (H) 38 - 126 U/L    Total Bilirubin 1.7 (H) 0.0 - 1.2 mg/dL    GFR, Estimated >39 >39 mL/min      Comment: (NOTE) Calculated using the CKD-EPI Creatinine Equation (2021)      Anion gap 7 5 - 15      Comment: Performed at [facility], [address] [place], [place] [address]  CBC     Status: Abnormal    Collection Time: 05/13/24  4:55 AM  Result Value Ref Range    WBC 7.6 4.0 - 10.5 K/uL    RBC 3.89 (L) 4.22 - 5.81 MIL/uL    Hemoglobin 11.2 (L) 13.0 - 17.0 g/dL    HCT 64.8 (L) 60.9 - 52.0 %    MCV 90.2 80.0 - 100.0 fL    MCH 28.8 26.0 - 34.0 pg    MCHC 31.9 30.0 - 36.0 g/dL    RDW 86.7 88.4 - 84.4 %    Platelets 427 (H) 150 - 400 K/uL    nRBC 0.0 0.0 - 0.2 %      Comment: Performed at [facility], [address], [place], [place] [address]  Magnesium      Status: None    Collection Time: 05/13/24  4:55 AM  Result Value Ref Range    Magnesium  2.2 1.7 - 2.4 mg/dL      Comment: Performed at [facility], [address], [place], [place] [address]  Vitamin B12     Status: Abnormal    Collection Time: 05/13/24  4:55 AM  Result Value Ref Range    Vitamin B-12 2,334 (H) 180 - 914 pg/mL      Comment: Performed at [facility], [address], [place], [place] [address]  Folate     Status: None    Collection Time: 05/13/24  4:55 AM  Result Value Ref Range    Folate >20.0 >5.9 ng/mL      Comment: Performed at [facility], [address], [place], [place] [address]  Iron and TIBC  Status: Abnormal    Collection Time: 05/13/24  4:55 AM  Result Value Ref Range    Iron 39 (L) 45 - 182 ug/dL    TIBC 762 (L) 749 - 549 ug/dL    Saturation Ratios 17 (L) 17.9 - 39.5 %    UIBC 197 ug/dL      Comment: Performed at [facility], [address] [place], [place] [address]  Ferritin     Status: Abnormal    Collection Time: 05/13/24  4:55 AM  Result Value Ref Range    Ferritin 878 (H) 24 - 336 ng/mL      Comment: Performed at [facility], [address] [place], [place] [address]  Reticulocytes     Status: Abnormal    Collection Time: 05/13/24  4:55 AM  Result Value Ref Range    Retic Ct Pct 3.3 (H) 0.4 - 3.1 %    RBC. 4.00 (L) 4.22 - 5.81 MIL/uL    Retic Count, Absolute 132.0 19.0 - 186.0 K/uL    Immature Retic Fract 25.8 (H) 2.3 - 15.9 %      Comment: Performed at [facility], [address] [place], [place] [address]      Imaging Results (Last 48 hours)  No results found.         Blood pressure 106/76, pulse 81, temperature 97.7 F (36.5 C), temperature source Oral, resp. rate 18, height 6' (1.829 m), weight 122 kg, SpO2 100%.   Medical Problem List and Plan: 1. 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  Weightbearing as tolerated             -patient may  shower with Left knee wrapped              -ELOS/Goals:  14-16d Sup/min A 2.  Antithrombotics: -DVT/anticoagulation:  Pharmaceutical: Eliquis             -antiplatelet therapy: N/A 3. Pain Management:  Robaxin  and oxycodone  as needed 4. Mood/Behavior/Sleep/dementia: Elavil 25 mg nightly, Namenda 10 mg twice daily, melatonin 5 mg nightly, Aricept  10 mg nightly trazodone  50 mg nightly as needed             -antipsychotic agents: N/A 5. Neuropsych/cognition: This patient is capable of making decisions on his own behalf. 6. Skin/Wound Care: Routine skin checks 7. Fluids/Electrolytes/Nutrition: Routine ins and outs with follow-up chemistries 8.  Hypertension.  HCTZ 12.5 mg daily.  Monitor with increased mobility 9.  BPH.  Proscar  5 mg daily 10.  Hyperlipidemia.  Zocor  11.  Prediabetes.  Hemoglobin A1c 5.9.  SSI 12.  OSA.  Patient does not use CPAP.  Check oxygen saturations every shift 13.  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  Dietary follow-up 14.  Constipation.  MiraLAX twice daily, Colace 100 mg twice daily Toribio JINNY Pitch, PA-C 05/13/2024

## 2024-05-13 NOTE — Discharge Summary (Signed)
 Physician Discharge Summary  Brandon Clark [facility] FMW:[id number] DOB: 1940/09/15 DOA: 04/30/2024  PCP: Gladystine Erminio CROME, MD  Admit date: 04/30/2024 Discharge date: 05/13/24  Admitted From: Home Disposition: CIR Recommendations for Outpatient Follow-up:  Outpatient follow-up with orthopedic surgery as below Check CMP and CBC in 1 week Please follow up on the following pending results: None   Discharge Condition: Stable CODE STATUS: Full code Diet Orders (From admission, onward)     Start     Ordered   05/03/24 1912  Diet heart healthy/carb modified Fluid consistency: Thin  Diet effective now       Question:  Fluid consistency:  Answer:  Thin   05/03/24 1911   04/30/24 0000  Diet - low sodium heart healthy        04/30/24 1448             Follow-up Information     Sheril Coy, MD. Schedule an appointment as soon as possible for a visit in 2 week(s).   Specialty: Orthopedic Surgery Contact information: [address] [place] [address] [phone]                 Hospital course 83 year old male with history of GERD/hiatal hernia/PUD, osteoarthritis, BPH, dementia, hyperlipidemia, hypertension, prediabetes, sleep apnea not on CPAP underwent left total knee replacement on 04/30/2024 by orthopedics.  TRH consulted on 05/03/2024 for dyspnea, dizziness and new oxygen requirement.  he was found to have left acute peroneal vein DVT on 05/02/2024 and was already started on Eliquis.  Because of dyspnea and oxygen requirement, he was supposed to be switched to heparin  drip but because of patient's faith/religion, he would not want to take pork products including heparin .  Eliquis was continued.  CTA chest showed acute PE with right heart strain.  Echocardiogram subsequently did not show any significant finding of right heart strain. Patient was maintained on loading dose Eliquis and is still on it. TRH signed off 11/4 but reconsulted on 11/5 due to episodic  severe left precordial chest pain.  Patient remained stable.  Therapy recommended CIR.  Outpatient follow-up with orthopedic surgery as above.  See individual problem list below for more.   Problems addressed during this hospitalization Acute PE with right heart strain Acute DVT of left peroneal veins Pleuritic chest pain-likely due to PE. -TTE without significant finding.  Hypoxemia resolved. -Continue Eliquis for anticoagulation -Continue encouraging incentive spirometry   Left knee osteoarthritis s/p left TKA: Still with significant swelling and limited range of motion. -Continue therapy -Outpatient follow-up with orthopedic surgery   Hypertension: Stable -Continue home HCTZ   Hyperlipidemia - Continue statin   Sleep apnea - Continue CPAP   Probable dementia: Seems to be fairly oriented -Continue memantine and donepezil    Diabetes mellitus type 2: A1c 5.9%. - Start low-dose metformin.   BPH without LUTS -Continue finasteride    Elevated liver enzymes: Mild and stable - Recheck CMP in 1 week. -Consider further workup if worse.   Insomnia -Continue home amitriptyline - Added melatonin.   Class II obesity Body mass index is 36.48 kg/m.           Consultations: Orthopedic surgery  Time spent 35  minutes  Vital signs Vitals:   05/12/24 1414 05/12/24 2104 05/13/24 0559 05/13/24 0900  BP: 129/86 115/78 106/76 125/84  Pulse: (!) 106 Comment: notify to the nurse. 90 81 89  Temp: 98.3 F (36.8 C) 98.9 F (37.2 C) 97.7 F (36.5 C) 98 F (36.7 C)  Resp: 18  18 18 18   Height:      Weight:      SpO2: 97% 96% 100% 100%  TempSrc: Oral Oral Oral Oral  BMI (Calculated):         Discharge exam  GENERAL: No apparent distress.  Nontoxic. HEENT: MMM.  Vision and hearing grossly intact.  NECK: Supple.  No apparent JVD.  RESP:  No IWOB.  Fair aeration bilaterally. CVS:  RRR. Heart sounds normal.  ABD/GI/GU: BS+. Abd soft, NTND.  MSK/EXT:  Moves  extremities. S/p left TKR.  Still with significant residual swelling but improved.  Limited range of motion in left knee. SKIN: As above. NEURO: AA.  Oriented appropriately.  No apparent focal neuro deficit. PSYCH: Calm. Normal affect.   Discharge Instructions Discharge Instructions     Diet - low sodium heart healthy   Complete by: As directed    Discharge instructions   Complete by: As directed    INSTRUCTIONS AFTER JOINT REPLACEMENT   Remove items at home which could result in a fall. This includes throw rugs or furniture in walking pathways ICE to the affected joint every three hours while awake for 30 minutes at a time, for at least the first 3-5 days, and then as needed for pain and swelling.  Continue to use ice for pain and swelling. You may notice swelling that will progress down to the foot and ankle.  This is normal after surgery.  Elevate your leg when you are not up walking on it.   Continue to use the breathing machine you got in the hospital (incentive spirometer) which will help keep your temperature down.  It is common for your temperature to cycle up and down following surgery, especially at night when you are not up moving around and exerting yourself.  The breathing machine keeps your lungs expanded and your temperature down.   DIET:  As you were doing prior to hospitalization, we recommend a well-balanced diet.  DRESSING / WOUND CARE / SHOWERING  You may shower 3 days after surgery, but keep the wounds dry during showering.  You may use an occlusive plastic wrap (Press'n Seal for example), NO SOAKING/SUBMERGING IN THE BATHTUB.  If the bandage gets wet, change with a clean dry gauze.  If the incision gets wet, pat the wound dry with a clean towel.  ACTIVITY  Increase activity slowly as tolerated, but follow the weight bearing instructions below.   No driving for 6 weeks or until further direction given by your physician.  You cannot drive while taking narcotics.  No  lifting or carrying greater than 10 lbs. until further directed by your surgeon. Avoid periods of inactivity such as sitting longer than an hour when not asleep. This helps prevent blood clots.  You may return to work once you are authorized by your doctor.     WEIGHT BEARING   Weight bearing as tolerated with assist device (walker, cane, etc) as directed, use it as long as suggested by your surgeon or therapist, typically at least 4-6 weeks.   EXERCISES  Results after joint replacement surgery are often greatly improved when you follow the exercise, range of motion and muscle strengthening exercises prescribed by your doctor. Safety measures are also important to protect the joint from further injury. Any time any of these exercises cause you to have increased pain or swelling, decrease what you are doing until you are comfortable again and then slowly increase them. If you have problems or questions, call your caregiver  or physical therapist for advice.   Rehabilitation is important following a joint replacement. After just a few days of immobilization, the muscles of the leg can become weakened and shrink (atrophy).  These exercises are designed to build up the tone and strength of the thigh and leg muscles and to improve motion. Often times heat used for twenty to thirty minutes before working out will loosen up your tissues and help with improving the range of motion but do not use heat for the first two weeks following surgery (sometimes heat can increase post-operative swelling).   These exercises can be done on a training (exercise) mat, on the floor, on a table or on a bed. Use whatever works the best and is most comfortable for you.    Use music or television while you are exercising so that the exercises are a pleasant break in your day. This will make your life better with the exercises acting as a break in your routine that you can look forward to.   Perform all exercises about fifteen  times, three times per day or as directed.  You should exercise both the operative leg and the other leg as well.  Exercises include:   Quad Sets - Tighten up the muscle on the front of the thigh (Quad) and hold for 5-10 seconds.   Straight Leg Raises - With your knee straight (if you were given a brace, keep it on), lift the leg to 60 degrees, hold for 3 seconds, and slowly lower the leg.  Perform this exercise against resistance later as your leg gets stronger.  Leg Slides: Lying on your back, slowly slide your foot toward your buttocks, bending your knee up off the floor (only go as far as is comfortable). Then slowly slide your foot back down until your leg is flat on the floor again.  Angel Wings: Lying on your back spread your legs to the side as far apart as you can without causing discomfort.  Hamstring Strength:  Lying on your back, push your heel against the floor with your leg straight by tightening up the muscles of your buttocks.  Repeat, but this time bend your knee to a comfortable angle, and push your heel against the floor.  You may put a pillow under the heel to make it more comfortable if necessary.   A rehabilitation program following joint replacement surgery can speed recovery and prevent re-injury in the future due to weakened muscles. Contact your doctor or a physical therapist for more information on knee rehabilitation.    CONSTIPATION  Constipation is defined medically as fewer than three stools per week and severe constipation as less than one stool per week.  Even if you have a regular bowel pattern at home, your normal regimen is likely to be disrupted due to multiple reasons following surgery.  Combination of anesthesia, postoperative narcotics, change in appetite and fluid intake all can affect your bowels.   YOU MUST use at least one of the following options; they are listed in order of increasing strength to get the job done.  They are all available over the  counter, and you may need to use some, POSSIBLY even all of these options:    Drink plenty of fluids (prune juice may be helpful) and high fiber foods Colace 100 mg by mouth twice a day  Senokot for constipation as directed and as needed Dulcolax (bisacodyl ), take with full glass of water  Miralax (polyethylene glycol) once or twice a  day as needed.  If you have tried all these things and are unable to have a bowel movement in the first 3-4 days after surgery call either your surgeon or your primary doctor.    If you experience loose stools or diarrhea, hold the medications until you stool forms back up.  If your symptoms do not get better within 1 week or if they get worse, check with your doctor.  If you experience the worst abdominal pain ever or develop nausea or vomiting, please contact the office immediately for further recommendations for treatment.   ITCHING:  If you experience itching with your medications, try taking only a single pain pill, or even half a pain pill at a time.  You can also use Benadryl  over the counter for itching or also to help with sleep.   TED HOSE STOCKINGS:  Use stockings on both legs until for at least 2 weeks or as directed by physician office. They may be removed at night for sleeping.  MEDICATIONS:  See your medication summary on the After Visit Summary that nursing will review with you.  You may have some home medications which will be placed on hold until you complete the course of blood thinner medication.  It is important for you to complete the blood thinner medication as prescribed.  PRECAUTIONS:  If you experience chest pain or shortness of breath - call 911 immediately for transfer to the hospital emergency department.   If you develop a fever greater that 101 F, purulent drainage from wound, increased redness or drainage from wound, foul odor from the wound/dressing, or calf pain - CONTACT YOUR SURGEON.                                                    FOLLOW-UP APPOINTMENTS:  If you do not already have a post-op appointment, please call the office for an appointment to be seen by your surgeon.  Guidelines for how soon to be seen are listed in your After Visit Summary, but are typically between 1-4 weeks after surgery.  OTHER INSTRUCTIONS:   Knee Replacement:  Do not place pillow under knee, focus on keeping the knee straight while resting. CPM instructions: 0-90 degrees, 2 hours in the morning, 2 hours in the afternoon, and 2 hours in the evening. Place foam block, curve side up under heel at all times except when in CPM or when walking.  DO NOT modify, tear, cut, or change the foam block in any way.  POST-OPERATIVE OPIOID TAPER INSTRUCTIONS: It is important to wean off of your opioid medication as soon as possible. If you do not need pain medication after your surgery it is ok to stop day one. Opioids include: Codeine , Hydrocodone (Norco, Vicodin), Oxycodone (Percocet, oxycontin ) and hydromorphone  amongst others.  Long term and even short term use of opiods can cause: Increased pain response Dependence Constipation Depression Respiratory depression And more.  Withdrawal symptoms can include Flu like symptoms Nausea, vomiting And more Techniques to manage these symptoms Hydrate well Eat regular healthy meals Stay active Use relaxation techniques(deep breathing, meditating, yoga) Do Not substitute Alcohol to help with tapering If you have been on opioids for less than two weeks and do not have pain than it is ok to stop all together.  Plan to wean off of opioids This plan should start within one  week post op of your joint replacement. Maintain the same interval or time between taking each dose and first decrease the dose.  Cut the total daily intake of opioids by one tablet each day Next start to increase the time between doses. The last dose that should be eliminated is the evening dose.     MAKE SURE YOU:  Understand  these instructions.  Get help right away if you are not doing well or get worse.    Thank you for letting us  be a part of your medical care team.  It is a privilege we respect greatly.  We hope these instructions will help you stay on track for a fast and full recovery!   Increase activity slowly as tolerated   Complete by: As directed       Allergies as of 05/13/2024       Reactions   Ondansetron  Itching, Swelling, Other (See Comments)   Swelling of the eyes, but this is questioned in 2025.   Porcine (pork) Protein-containing Drug Products Other (See Comments)        Medication List     STOP taking these medications    aspirin  EC 325 MG tablet   diclofenac  75 MG EC tablet Commonly known as: VOLTAREN    naproxen  500 MG tablet Commonly known as: NAPROSYN    predniSONE  10 MG tablet Commonly known as: DELTASONE        TAKE these medications    acetaminophen  500 MG tablet Commonly known as: TYLENOL  Take 500-1,000 mg by mouth every 6 (six) hours as needed.   AeroChamber Plus inhaler Use with inhaler   albuterol  108 (90 Base) MCG/ACT inhaler Commonly known as: VENTOLIN  HFA Inhale 1-2 puffs into the lungs every 4 (four) hours as needed for wheezing or shortness of breath.   amitriptyline 25 MG tablet Commonly known as: ELAVIL Take 25 mg by mouth at bedtime.   apixaban 5 MG Tabs tablet Commonly known as: ELIQUIS Take 1 tablet (5 mg total) by mouth 2 (two) times daily.   cetirizine  5 MG tablet Commonly known as: ZYRTEC  Take 1 tablet (5 mg total) by mouth daily.   donepezil  10 MG tablet Commonly known as: Aricept  Take 1 tablet (10 mg total) by mouth at bedtime.   feeding supplement Liqd Take 237 mLs by mouth 2 (two) times daily between meals.   finasteride  5 MG tablet Commonly known as: Proscar  Take 1 tablet (5 mg total) by mouth daily.   hydrochlorothiazide  12.5 MG tablet Commonly known as: HYDRODIURIL  Take 1 tablet (12.5 mg total) by mouth daily.    hydrOXYzine 25 MG tablet Commonly known as: ATARAX Take 25 mg by mouth 3 (three) times daily as needed.   meclizine  12.5 MG tablet Commonly known as: ANTIVERT  Take 1 tablet (12.5 mg total) by mouth 3 (three) times daily as needed for dizziness.   melatonin 5 MG Tabs Take 1 tablet (5 mg total) by mouth at bedtime.   memantine 10 MG tablet Commonly known as: NAMENDA Take 10 mg by mouth 2 (two) times daily.   metFORMIN 500 MG tablet Commonly known as: GLUCOPHAGE Take 1 tablet (500 mg total) by mouth 2 (two) times daily with a meal.   methocarbamol  500 MG tablet Commonly known as: ROBAXIN  Take 1 tablet (500 mg total) by mouth every 6 (six) hours as needed for muscle spasms. What changed: when to take this   oxyCODONE -acetaminophen  5-325 MG tablet Commonly known as: PERCOCET/ROXICET Take 1-2 tablets by mouth every 6 (six) hours  as needed for moderate pain (pain score 4-6) or severe pain (pain score 7-10) (Post op pain).   simvastatin  10 MG tablet Commonly known as: ZOCOR  Take 1 tablet (10 mg total) by mouth at bedtime.               Durable Medical Equipment  (From admission, onward)           Start     Ordered   04/30/24 1719  DME Walker rolling  Once       Question:  Patient needs a walker to treat with the following condition  Answer:  Primary osteoarthritis of left knee   04/30/24 1718   04/30/24 1719  DME 3 n 1  Once        04/30/24 1718   04/30/24 1719  DME Bedside commode  Once       Question:  Patient needs a bedside commode to treat with the following condition  Answer:  Primary osteoarthritis of left knee   04/30/24 1718             Procedures/Studies: 10/28-left TKR by Dr. Roxianne BARE CHEST PORT 1 VIEW Result Date: 05/08/2024 CLINICAL DATA:  Shortness of breath. EXAM: PORTABLE CHEST 1 VIEW COMPARISON:  Chest radiograph dated 06/09/2021. FINDINGS: No focal consolidation, pleural effusion or pneumothorax. Stable cardiac silhouette. No  acute osseous pathology. IMPRESSION: No active disease. Electronically Signed   By: Vanetta Chou M.D.   On: 05/08/2024 20:56   ECHOCARDIOGRAM COMPLETE Result Date: 05/03/2024    ECHOCARDIOGRAM REPORT   Patient Name:   Brandon Clark [facility] Date of Exam: 05/03/2024 Medical Rec #:  [id number]                 Height:       72.0 in Accession #:    [id number]                Weight:       266.0 lb Date of Birth:  October 19, 1940                 BSA:          2.405 m Patient Age:    83 years                  BP:           152/102 mmHg Patient Gender: M                         HR:           113 bpm. Exam Location:  Inpatient Procedure: 2D Echo, Cardiac Doppler and Color Doppler (Both Spectral and Color            Flow Doppler were utilized during procedure). Indications:    Dyspnea R06.00  History:        Patient has no prior history of Echocardiogram examinations.                 Risk Factors:Hypertension and Dyslipidemia.  Sonographer:    Tinnie Gosling RDCS Referring Phys: [phone] DAVID MANUEL ORTIZ IMPRESSIONS  1. Patient tachycardic throughout the exam. Left ventricular ejection fraction, by estimation, is 60 to 65%. The left ventricle has hyperdynamic function. The left ventricle has no regional wall motion abnormalities. There is mild concentric left ventricular hypertrophy. Left ventricular diastolic parameters were normal.  2. Right ventricular systolic function is normal. The right ventricular size is moderately  enlarged. Tricuspid regurgitation signal is inadequate for assessing PA pressure.  3. The mitral valve is normal in structure. No evidence of mitral valve regurgitation. No evidence of mitral stenosis.  4. The aortic valve is tricuspid. Aortic valve regurgitation is not visualized. No aortic stenosis is present.  5. Aortic dilatation noted. There is mild dilatation of the ascending aorta, measuring 42 mm.  6. The inferior vena cava is dilated in size with >50% respiratory variability, suggesting right  atrial pressure of 8 mmHg. Conclusion(s)/Recommendation(s): Hyperdynamic LV systolic function. Moderate RV enlargement with preserved function. Mild dilation of ascending thoracic aorta measuring 4.2 cm. Recommend repeat thoracic imaging via echo or CT in 1 year to ensure stability in size. FINDINGS  Left Ventricle: Patient tachycardic throughout the exam. Left ventricular ejection fraction, by estimation, is 60 to 65%. The left ventricle has hyperdynamic function. The left ventricle has no regional wall motion abnormalities. The left ventricular internal cavity size was normal in size. There is mild concentric left ventricular hypertrophy. Left ventricular diastolic parameters were normal. Right Ventricle: The right ventricular size is moderately enlarged. No increase in right ventricular wall thickness. Right ventricular systolic function is normal. Tricuspid regurgitation signal is inadequate for assessing PA pressure. Left Atrium: Left atrial size was normal in size. Right Atrium: Right atrial size was normal in size. Pericardium: There is no evidence of pericardial effusion. Mitral Valve: The mitral valve is normal in structure. No evidence of mitral valve regurgitation. No evidence of mitral valve stenosis. Tricuspid Valve: The tricuspid valve is normal in structure. Tricuspid valve regurgitation is trivial. No evidence of tricuspid stenosis. Aortic Valve: The aortic valve is tricuspid. Aortic valve regurgitation is not visualized. No aortic stenosis is present. Pulmonic Valve: The pulmonic valve was normal in structure. Pulmonic valve regurgitation is not visualized. No evidence of pulmonic stenosis. Aorta: Aortic dilatation noted. There is mild dilatation of the ascending aorta, measuring 42 mm. Venous: The inferior vena cava is dilated in size with greater than 50% respiratory variability, suggesting right atrial pressure of 8 mmHg. IAS/Shunts: The interatrial septum was not well visualized.  LEFT VENTRICLE  PLAX 2D LVIDd:         5.00 cm   Diastology LVIDs:         3.90 cm   LV e' lateral:   10.20 cm/s LV PW:         1.10 cm   LV E/e' lateral: 7.1 LV IVS:        1.10 cm LVOT diam:     2.10 cm LV SV:         53 LV SV Index:   22 LVOT Area:     3.46 cm LV IVRT:       74 msec  RIGHT VENTRICLE             IVC RV S prime:     18.30 cm/s  IVC diam: 2.30 cm TAPSE (M-mode): 1.9 cm LEFT ATRIUM           Index LA diam:      3.30 cm 1.37 cm/m LA Vol (A2C): 16.2 ml 6.74 ml/m  AORTIC VALVE LVOT Vmax:   123.00 cm/s LVOT Vmean:  90.900 cm/s LVOT VTI:    0.152 m  AORTA Ao Root diam: 3.90 cm Ao Asc diam:  4.20 cm MITRAL VALVE MV Area (PHT): 6.27 cm     SHUNTS MV Decel Time: 121 msec     Systemic VTI:  0.15 m MV E velocity: 72.80  cm/s   Systemic Diam: 2.10 cm MV A velocity: 102.00 cm/s MV E/A ratio:  0.71 Georganna Archer Electronically signed by Georganna Archer Signature Date/Time: 05/03/2024/2:07:52 PM    Final    CT Angio Chest Pulmonary Embolism (PE) W or WO Contrast Result Date: 05/03/2024 EXAM: CTA of the Chest with contrast for PE 05/03/2024 01:07:38 PM TECHNIQUE: CTA of the chest was performed without and with the administration of 80 mL of iohexol  (OMNIPAQUE ) 350 MG/ML injection. Multiplanar reformatted images are provided for review. MIP images are provided for review. Automated exposure control, iterative reconstruction, and/or weight based adjustment of the mA/kV was utilized to reduce the radiation dose to as low as reasonably achievable. COMPARISON: None available. CLINICAL HISTORY: Pulmonary embolism (PE) suspected, high prob; Just diagnosed with DVT. FINDINGS: PULMONARY ARTERIES: Evaluation of the pulmonary arteries is limited secondary to respiratory motion artifact. Suspected nonocclusive pulmonary emboli in the segmental branches of the right upper lobe (series 5, image 155). Additional occlusive pulmonary embolism in a segmental branch of a lingular artery (series 5, image 170). Main pulmonary artery is normal  in caliber. MEDIASTINUM: The heart and pericardium demonstrate no acute abnormality. There is evidence of right heart strain with RV/LV ratio of 46/35 mm. Ascending aorta measuring 43 mm. Thyroidectomy. LYMPH NODES: No mediastinal, hilar or axillary lymphadenopathy. LUNGS AND PLEURA: Atelectasis in the right greater than left lower lobes. No focal consolidation or pulmonary edema. No pleural effusion or pneumothorax. UPPER ABDOMEN: Limited images of the upper abdomen demonstrate hepatic steatosis. Small hiatal hernia . SOFT TISSUES AND BONES: No acute bone or soft tissue abnormality. IMPRESSION: 1. Suspected nonocclusive pulmonary emboli in the segmental branches of the right upper lobe and additional occlusive pulmonary embolism in a segmental branch of a lingular artery. 2. Evidence of right heart strain with RV/LV ratio of 1.3. 3. 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 Recommend annual imaging followup by CTA or MRA. This recommendation follows 2010 ACCF/AHA/AATS/ACR/ASA/SCA/SCAI/SIR/STS/SVM Guidelines for the Diagnosis and Management of Patients with Thoracic Aortic Disease. Circulation. 2010; 121: Z733-z630. Aortic aneurysm NOS (ICD10-I71.9) 4. Results will be called by the radiology assistant and documented within [facility]. Electronically signed by: Norman Gatlin MD 05/03/2024 01:58 PM EDT RP Workstation: [id number]   VAS US  LOWER EXTREMITY VENOUS (DVT) Result Date: 05/02/2024  Lower Venous DVT Study Patient Name:  Brandon Clark [facility]  Date of Exam:   05/02/2024 Medical Rec #: [id number]                  Accession #:    [id number] Date of Birth: Jun 04, 1941                  Patient Gender: M Patient Age:   63 years Exam Location:  [facility] Procedure:      VAS US  LOWER EXTREMITY VENOUS (DVT) Referring Phys: PRENTICE NIDA --------------------------------------------------------------------------------  Indications: Pain, Edema, and Post-op.  Limitations: Poor ultrasound/tissue interface and  body habitus. Comparison Study: Previous study 12.7.2018 Performing Technologist: Edilia Elden Appl  Examination Guidelines: A complete evaluation includes B-mode imaging, spectral Doppler, color Doppler, and power Doppler as needed of all accessible portions of each vessel. Bilateral testing is considered an integral part of a complete examination. Limited examinations for reoccurring indications may be performed as noted. The reflux portion of the exam is performed with the patient in reverse Trendelenburg.  +-----+---------------+---------+-----------+----------+--------------+ RIGHTCompressibilityPhasicitySpontaneityPropertiesThrombus Aging +-----+---------------+---------+-----------+----------+--------------+ CFV  Full           Yes      Yes                                 +-----+---------------+---------+-----------+----------+--------------+  SFJ  Full           Yes      Yes                                 +-----+---------------+---------+-----------+----------+--------------+   +---------+---------------+---------+-----------+----------+--------------+ LEFT     CompressibilityPhasicitySpontaneityPropertiesThrombus Aging +---------+---------------+---------+-----------+----------+--------------+ CFV      Full           Yes      Yes                                 +---------+---------------+---------+-----------+----------+--------------+ SFJ      Full           Yes      Yes                                 +---------+---------------+---------+-----------+----------+--------------+ FV Prox  Full                                                        +---------+---------------+---------+-----------+----------+--------------+ FV Mid   Full                                                        +---------+---------------+---------+-----------+----------+--------------+ FV DistalFull                                                         +---------+---------------+---------+-----------+----------+--------------+ PFV      Full                                                        +---------+---------------+---------+-----------+----------+--------------+ POP      Full           Yes      Yes                                 +---------+---------------+---------+-----------+----------+--------------+ PTV      Full                                                        +---------+---------------+---------+-----------+----------+--------------+ PERO     None           No       No                                  +---------+---------------+---------+-----------+----------+--------------+ Deep  vein thrombosis noted in the peroneal vein at the middle segment.   Summary: RIGHT: - No evidence of common femoral vein obstruction.   LEFT: - Findings consistent with acute deep vein thrombosis involving the left peroneal veins.  - No cystic structure found in the popliteal fossa.  *See table(s) above for measurements and observations. Electronically signed by Debby Robertson on 05/02/2024 at 8:08:34 PM.    Final        The results of significant diagnostics from this hospitalization (including imaging, microbiology, ancillary and laboratory) are listed below for reference.     Microbiology: Recent Results (from the past 240 hours)  MRSA Next Gen by PCR, Nasal     Status: None   Collection Time: 05/03/24 11:43 PM   Specimen: Nasal Mucosa; Nasal Swab  Result Value Ref Range Status   MRSA by PCR Next Gen NOT DETECTED NOT DETECTED Final    Comment: (NOTE) The GeneXpert MRSA Assay (FDA approved for NASAL specimens only), is one component of a comprehensive MRSA colonization surveillance program. It is not intended to diagnose MRSA infection nor to guide or monitor treatment for MRSA infections. Test performance is not FDA approved in patients less than 76 years old. Performed at [facility], [address], [place], [place] [address]      Labs:  CBC: Recent Labs  Lab 05/08/24 1805 05/13/24 0455  WBC 8.4 7.6  NEUTROABS 4.5  --   HGB 10.8* 11.2*  HCT 33.3* 35.1*  MCV 90.2 90.2  PLT 281 427*   BMP &GFR Recent Labs  Lab 05/08/24 1805 05/13/24 0455  NA 134* 136  K 4.2 4.5  CL 96* 99  CO2 29 30  GLUCOSE 131* 133*  BUN 17 19  CREATININE 0.83 0.90  CALCIUM 9.1 9.4  MG  --  2.2   Estimated Creatinine Clearance: 86.9 mL/min (by C-G formula based on SCr of 0.9 mg/dL). Liver & Pancreas: Recent Labs  Lab 05/08/24 1805 05/13/24 0455  AST 57* 76*  ALT 62* 85*  ALKPHOS 177* 274*  BILITOT 1.3* 1.7*  PROT 6.3* 6.4*  ALBUMIN 3.2* 3.4*   No results for input(s): LIPASE, AMYLASE in the last 168 hours. No results for input(s): AMMONIA in the last 168 hours. Diabetic: No results for input(s): HGBA1C in the last 72 hours. Recent Labs  Lab 05/12/24 0746 05/12/24 1138 05/12/24 1641 05/12/24 2105 05/13/24 0737  GLUCAP 141* 150* 137* 143* 133*   Cardiac Enzymes: No results for input(s): CKTOTAL, CKMB, CKMBINDEX, TROPONINI in the last 168 hours. No results for input(s): PROBNP in the last 8760 hours. Coagulation Profile: No results for input(s): INR, PROTIME in the last 168 hours. Thyroid  Function Tests: No results for input(s): TSH, T4TOTAL, FREET4, T3FREE, THYROIDAB in the last 72 hours. Lipid Profile: No results for input(s): CHOL, HDL, LDLCALC, TRIG, CHOLHDL, LDLDIRECT in the last 72 hours. Anemia Panel: Recent Labs    05/13/24 0455  VITAMINB12 2,334*  FOLATE >20.0  FERRITIN 878*  TIBC 237*  IRON 39*  RETICCTPCT 3.3*   Urine analysis:    Component Value Date/Time   COLORURINE YELLOW 12/19/2023 1857   APPEARANCEUR CLEAR 12/19/2023 1857   LABSPEC 1.021 12/19/2023 1857   PHURINE 5.5 12/19/2023 1857   GLUCOSEU NEGATIVE 12/19/2023 1857   HGBUR NEGATIVE 12/19/2023 1857   BILIRUBINUR NEGATIVE 12/19/2023 1857    BILIRUBINUR negative 12/15/2014 1034   KETONESUR NEGATIVE 12/19/2023 1857   PROTEINUR NEGATIVE 12/19/2023 1857   UROBILINOGEN 0.2 12/15/2014 1034  UROBILINOGEN 0.2 06/30/2014 0943   NITRITE NEGATIVE 12/19/2023 1857   LEUKOCYTESUR NEGATIVE 12/19/2023 1857   Sepsis Labs: Invalid input(s): PROCALCITONIN, LACTICIDVEN   SIGNED:  Mignon ONEIDA Bump, MD  [facility] 05/13/2024, 11:09 AM

## 2024-05-13 NOTE — Discharge Summary (Signed)
 Physician Discharge Summary  Patient ID: Brandon Clark MRN: [id number] DOB/AGE: February 22, 1941 83 y.o.  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 EXAM: PORTABLE CHEST 1 VIEW COMPARISON:  Chest radiograph dated 06/09/2021. FINDINGS: No focal consolidation, pleural effusion or pneumothorax. Stable cardiac silhouette. No acute osseous pathology. IMPRESSION: No active disease. Electronically Signed   By: Vanetta Chou M.D.   On: 05/08/2024 20:56   ECHOCARDIOGRAM COMPLETE Result Date: 05/03/2024    ECHOCARDIOGRAM REPORT   Patient Name:   Brandon Clark [facility] Date of Exam: 05/03/2024 Medical Rec #:  [id number]                 Height:       72.0 in Accession #:    [id number]                Weight:       266.0 lb Date of Birth:  [birth year]                 BSA:          2.405 m Patient Age:    83 years                  BP:           152/102 mmHg Patient Gender: M                         HR:           113 bpm. Exam Location:  Inpatient Procedure: 2D Echo, Cardiac Doppler and Color Doppler (Both Spectral and Color            Flow Doppler were utilized during procedure). Indications:    Dyspnea R06.00  History:        Patient has no prior history of Echocardiogram examinations.                 Risk Factors:Hypertension and Dyslipidemia.  Sonographer:    Tinnie Gosling RDCS Referring Phys: [phone] DAVID MANUEL ORTIZ IMPRESSIONS  1. Patient tachycardic throughout the exam. Left ventricular ejection fraction, by estimation, is 60 to 65%. The left ventricle has  hyperdynamic function. The left ventricle has no regional wall motion abnormalities. There is mild concentric left ventricular hypertrophy. Left ventricular diastolic parameters were normal.  2. Right ventricular systolic function is normal. The right ventricular size is moderately enlarged. Tricuspid regurgitation signal is inadequate for assessing PA pressure.  3. The mitral valve is normal in structure. No evidence of mitral valve regurgitation. No evidence of mitral stenosis.  4. The aortic valve is tricuspid. Aortic valve regurgitation is not visualized. No aortic stenosis is present.  5. Aortic dilatation noted. There is mild dilatation of the ascending aorta, measuring 42 mm.  6. The inferior vena cava is dilated in size with >50% respiratory variability, suggesting right atrial pressure of 8 mmHg. Conclusion(s)/Recommendation(s): Hyperdynamic LV systolic function. Moderate RV enlargement with preserved function. Mild dilation of ascending thoracic aorta measuring 4.2 cm. Recommend repeat thoracic imaging via echo or CT in 1 year to ensure stability in size. FINDINGS  Left Ventricle: Patient tachycardic throughout  the exam. Left ventricular ejection fraction, by estimation, is 60 to 65%. The left ventricle has hyperdynamic function. The left ventricle has no regional wall motion abnormalities. The left ventricular internal cavity size was normal in size. There is mild concentric left ventricular hypertrophy. Left ventricular diastolic parameters were normal. Right Ventricle: The right ventricular size is moderately enlarged. No increase in right ventricular wall thickness. Right ventricular systolic function is normal. Tricuspid regurgitation signal is inadequate for assessing PA pressure. Left Atrium: Left atrial size was normal in size. Right Atrium: Right atrial size was normal in size. Pericardium: There is no evidence of pericardial effusion. Mitral Valve: The mitral valve is normal in structure. No  evidence of mitral valve regurgitation. No evidence of mitral valve stenosis. Tricuspid Valve: The tricuspid valve is normal in structure. Tricuspid valve regurgitation is trivial. No evidence of tricuspid stenosis. Aortic Valve: The aortic valve is tricuspid. Aortic valve regurgitation is not visualized. No aortic stenosis is present. Pulmonic Valve: The pulmonic valve was normal in structure. Pulmonic valve regurgitation is not visualized. No evidence of pulmonic stenosis. Aorta: Aortic dilatation noted. There is mild dilatation of the ascending aorta, measuring 42 mm. Venous: The inferior vena cava is dilated in size with greater than 50% respiratory variability, suggesting right atrial pressure of 8 mmHg. IAS/Shunts: The interatrial septum was not well visualized.  LEFT VENTRICLE PLAX 2D LVIDd:         5.00 cm   Diastology LVIDs:         3.90 cm   LV e' lateral:   10.20 cm/s LV PW:         1.10 cm   LV E/e' lateral: 7.1 LV IVS:        1.10 cm LVOT diam:     2.10 cm LV SV:         53 LV SV Index:   22 LVOT Area:     3.46 cm LV IVRT:       74 msec  RIGHT VENTRICLE             IVC RV S prime:     18.30 cm/s  IVC diam: 2.30 cm TAPSE (M-mode): 1.9 cm LEFT ATRIUM           Index LA diam:      3.30 cm 1.37 cm/m LA Vol (A2C): 16.2 ml 6.74 ml/m  AORTIC VALVE LVOT Vmax:   123.00 cm/s LVOT Vmean:  90.900 cm/s LVOT VTI:    0.152 m  AORTA Ao Root diam: 3.90 cm Ao Asc diam:  4.20 cm MITRAL VALVE MV Area (PHT): 6.27 cm     SHUNTS MV Decel Time: 121 msec     Systemic VTI:  0.15 m MV E velocity: 72.80 cm/s   Systemic Diam: 2.10 cm MV A velocity: 102.00 cm/s MV E/A ratio:  0.71 Georganna Archer Electronically signed by Georganna Archer Signature Date/Time: 05/03/2024/2:07:52 PM    Final    CT Angio Chest Pulmonary Embolism (PE) W or WO Contrast Result Date: 05/03/2024 EXAM: CTA of the Chest with contrast for PE 05/03/2024 01:07:38 PM TECHNIQUE: CTA of the chest was performed without and with the administration of 80 mL of  iohexol  (OMNIPAQUE ) 350 MG/ML injection. Multiplanar reformatted images are provided for review. MIP images are provided for review. Automated exposure control, iterative reconstruction, and/or weight based adjustment of the mA/kV was utilized to reduce the radiation dose to as low as reasonably achievable. COMPARISON: None available. CLINICAL HISTORY: Pulmonary embolism (PE) suspected,  high prob; Just diagnosed with DVT. FINDINGS: PULMONARY ARTERIES: Evaluation of the pulmonary arteries is limited secondary to respiratory motion artifact. Suspected nonocclusive pulmonary emboli in the segmental branches of the right upper lobe (series 5, image 155). Additional occlusive pulmonary embolism in a segmental branch of a lingular artery (series 5, image 170). Main pulmonary artery is normal in caliber. MEDIASTINUM: The heart and pericardium demonstrate no acute abnormality. There is evidence of right heart strain with RV/LV ratio of 46/35 mm. Ascending aorta measuring 43 mm. Thyroidectomy. LYMPH NODES: No mediastinal, hilar or axillary lymphadenopathy. LUNGS AND PLEURA: Atelectasis in the right greater than left lower lobes. No focal consolidation or pulmonary edema. No pleural effusion or pneumothorax. UPPER ABDOMEN: Limited images of the upper abdomen demonstrate hepatic steatosis. Small hiatal hernia . SOFT TISSUES AND BONES: No acute bone or soft tissue abnormality. IMPRESSION: 1. Suspected nonocclusive pulmonary emboli in the segmental branches of the right upper lobe and additional occlusive pulmonary embolism in a segmental branch of a lingular artery. 2. Evidence of right heart strain with RV/LV ratio of 1.3. 3. 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 Recommend annual imaging followup by CTA or MRA. This recommendation follows 2010 ACCF/AHA/AATS/ACR/ASA/SCA/SCAI/SIR/STS/SVM Guidelines for the Diagnosis and Management of Patients with Thoracic Aortic Disease. Circulation. 2010; 121: Z733-z630. Aortic aneurysm  NOS (ICD10-I71.9) 4. Results will be called by the radiology assistant and documented within [facility]. Electronically signed by: Norman Gatlin MD 05/03/2024 01:58 PM EDT RP Workstation: [id number]   VAS US  LOWER EXTREMITY VENOUS (DVT) Result Date: 05/02/2024  Lower Venous DVT Study Patient Name:  Brandon Clark [facility]  Date of Exam:   05/02/2024 Medical Rec #: [id number]                  Accession #:    [id number] Date of Birth: 10/10/1940                  Patient Gender: M Patient Age:   8 years Exam Location:  [facility] Procedure:      VAS US  LOWER EXTREMITY VENOUS (DVT) Referring Phys: PRENTICE NIDA --------------------------------------------------------------------------------  Indications: Pain, Edema, and Post-op.  Limitations: Poor ultrasound/tissue interface and body habitus. Comparison Study: Previous study 12.7.2018 Performing Technologist: Edilia Elden Appl  Examination Guidelines: A complete evaluation includes B-mode imaging, spectral Doppler, color Doppler, and power Doppler as needed of all accessible portions of each vessel. Bilateral testing is considered an integral part of a complete examination. Limited examinations for reoccurring indications may be performed as noted. The reflux portion of the exam is performed with the patient in reverse Trendelenburg.  +-----+---------------+---------+-----------+----------+--------------+ RIGHTCompressibilityPhasicitySpontaneityPropertiesThrombus Aging +-----+---------------+---------+-----------+----------+--------------+ CFV  Full           Yes      Yes                                 +-----+---------------+---------+-----------+----------+--------------+ SFJ  Full           Yes      Yes                                 +-----+---------------+---------+-----------+----------+--------------+   +---------+---------------+---------+-----------+----------+--------------+ LEFT      CompressibilityPhasicitySpontaneityPropertiesThrombus Aging +---------+---------------+---------+-----------+----------+--------------+ CFV      Full           Yes      Yes                                 +---------+---------------+---------+-----------+----------+--------------+  SFJ      Full           Yes      Yes                                 +---------+---------------+---------+-----------+----------+--------------+ FV Prox  Full                                                        +---------+---------------+---------+-----------+----------+--------------+ FV Mid   Full                                                        +---------+---------------+---------+-----------+----------+--------------+ FV DistalFull                                                        +---------+---------------+---------+-----------+----------+--------------+ PFV      Full                                                        +---------+---------------+---------+-----------+----------+--------------+ POP      Full           Yes      Yes                                 +---------+---------------+---------+-----------+----------+--------------+ PTV      Full                                                        +---------+---------------+---------+-----------+----------+--------------+ PERO     None           No       No                                  +---------+---------------+---------+-----------+----------+--------------+ Deep vein thrombosis noted in the peroneal vein at the middle segment.   Summary: RIGHT: - No evidence of common femoral vein obstruction.   LEFT: - Findings consistent with acute deep vein thrombosis involving the left peroneal veins.  - No cystic structure found in the popliteal fossa.  *See table(s) above for measurements and observations. Electronically signed by Debby Robertson on 05/02/2024 at 8:08:34 PM.    Final     Labs:   Basic Metabolic Panel: Recent Labs  Lab 05/20/24 0456 05/21/24 1037 05/23/24 0508  NA  --  137 135  K  --  3.7 3.5  CL  --  98 96*  CO2  --  29 28  GLUCOSE  --  159* 113*  BUN  --  15 13  CREATININE  --  0.96 0.95  CALCIUM  --  8.8* 8.5*  MG 1.9 2.0 2.2    CBC: Recent Labs  Lab 05/21/24 1037  WBC 5.5  NEUTROABS 2.7  HGB 12.1*  HCT 36.6*  MCV 87.1  PLT 475*    CBG: Recent Labs  Lab 05/22/24 2104 05/23/24 0619 05/23/24 1131 05/23/24 1617 05/23/24 2115  GLUCAP 110* 122* 119* 209* 142*  Family history.  Mother and brother with diabetes.  Denies any colon cancer esophageal cancer or rectal cancer  Brief HPI:   Brandon Clark is a 82 y.o. Arabic speaking right-handed male with history significant for BPH dementia maintained on Aricept  as well as Namenda  hyperlipidemia hypertension sleep apnea does not use CPAP, prediabetes, BPV, hiatal hernia status post Niesen fundoplication procedure.  Per chart review lives with children.  Multilevel home can reside on main level.  Modified independent with a straight point cane prior to admission.  Presented 04/30/2024 undergoing left total knee replacement for osteoarthritis per Dr. Sheril and weightbearing as tolerated placed on Eliquis  for DVT prophylaxis.  Postoperatively patient with dyspnea dizziness and new oxygen requirement.  CT angio of the chest showed suspected nonocclusive pulmonary emboli in the segmental branches of the right upper lobe and additional occlusive pulmonary embolism in a segmental branch of lingular artery.  Evidence of right heart strain with RV/LV ratio 1-3.  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  Venous Doppler study showed DVT left peroneal vein.  Echocardiogram completed did not show any significant heart strain and ejection fraction of 60 to 65%.  It was initially suggested switch to heparin  drip but because of patient's faith/religion, he would not want to take pork products and fluting  heparin  thus Eliquis  was continued.  Patient did have some initial pleuritic chest pain hypoxemia since resolved encouragement of incentive spirometer.  Therapy evaluations completed due to patient decreased functional mobility was admitted for a comprehensive rehab program.   Hospital Course: Brandon Clark was admitted to rehab 05/13/2024 for inpatient therapies to consist of PT, ST and OT at least three hours five days a week. Past admission physiatrist, therapy team and rehab RN have worked together to provide customized collaborative inpatient rehab.  Pertaining to patient's debility related to pulmonary emboli DVT after left total knee replacement 04/30/2024.  Surgical site healing nicely weightbearing as tolerated neurovascular sensation intact.  He continued on Eliquis  for both pulmonary emboli DVT and monitoring of oxygen saturations that maintained greater than 90%.  Pain management with the use of scheduled OxyContin  15 mg every 12 hours, Robaxin  as well as oxycodone  as needed.  Mood stabilization with history of dementia continued on Elavil  as well as Namenda  10 mg daily with Aricept  10 mg nightly using melatonin and trazodone  to help aid in sleep.  Blood pressure remained controlled on HCTZ monitored with increased mobility.  BPH with Proscar  5 mg daily voiding without difficulty.  Zocor  ongoing for hyperlipidemia.  Prediabetes hemoglobin A1c 5.9 using sliding scale insulin  and patient had been on Glucophage  prior to admission.  Class II obesity BMI 36.48 dietary follow-up.  Bouts of constipation resolved with laxative assistance.   Blood pressures were monitored on TID basis and remained controlled and monitored  Diabetes has been monitored with ac/hs CBG checks and SSI was use prn for tighter BS control.    Rehab course: During patient's stay in rehab weekly team conferences were held to monitor patient's progress,  set goals and discuss barriers to discharge. At admission,  patient required moderate assist supine to sit mod assist sit to supine minimal assist step pivot transfers  He/She  has had improvement in activity tolerance, balance, postural control as well as ability to compensate for deficits. He/She has had improvement in functional use RUE/LUE  and RLE/LLE as well as improvement in awareness.  Working with energy conservation techniques.  Modified independent for toileting.  Ambulates to the bathroom standby assist.  Patient ambulates from bathroom to wheelchair for seated grooming task at the sink.  Set up assist for G/H seated.  Minimal assist with basic self-care skills and ADLs.  Sit to stand transfers with minimal assist as well as chair to bed transfers.  Car transfers minimal assist.  Ambulates with rolling walker 23-50 feet.  Full family teaching completed plan discharged home       Disposition:  Discharge disposition: 01-Home or Self Care        Diet: Diabetic diet  Special Instructions: No driving smoking or alcohol  Weightbearing as tolerated left lower extremity  Medications at discharge 1.  Tylenol  as needed 2.  Elavil  10 mg nightly 3.  Eliquis  5 mg p.o. twice daily 4.  Colace 100 mg twice daily 5.  Aricept  10 mg nightly 6.  Pro scar 5 mg p.o. daily 7.  HCTZ 12.5 mg p.o. daily 8.  Melatonin 5 mg p.o. nightly 9.  Namenda  10 mg p.o. twice daily 10.  Robaxin -750 milligrams every 6 hours as needed muscle spasms 11.  Oxycodone  5-325 mg 2 tablets every 8 hours hours as needed pain 12.  Zocor  10 mg daily 13.  MiraLAX  daily as needed hold for loose stools 14.  Albuterol  inhaler 1 to 2 puffs every 4 hours as needed shortness of breath 15.  Zyrtec  5 mg p.o. daily 16.  Glucophage  500 mg p.o. twice daily 17.  OxyContin  sustained-release 15 mg every 12 hours 18.  Atarax  25 mg 3 times daily as needed 19.  Antivert  12.5 mg 3 times daily as needed dizziness  30-35 minutes were spent completing discharge summary and discharge  planning  Discharge Instructions     Ambulatory referral to Physical Medicine Rehab   Complete by: As directed    Moderate complexity follow-up 1 month debility/pulmonary emboli        Follow-up Information     Raulkar, Sven SQUIBB, MD Follow up.   Specialty: Physical Medicine and Rehabilitation Why: Office to call for appointment Contact information: [address] [place] [address] [phone]         Gladystine Erminio CROME, MD Follow up.   Specialty: Family Medicine Why: Call for appointment Contact information: [address] [place] [address] [phone]         Sheril Coy, MD Follow up.   Specialty: Orthopedic Surgery Why: Call for appointment Contact information: [address] [place] [address] [phone]                 Signed: Toribio PARAS Lorraina Clark 05/24/2024, 4:34 AM

## 2024-05-13 NOTE — Plan of Care (Signed)
   Problem: Health Behavior/Discharge Planning: Goal: Ability to manage health-related needs will improve Outcome: Progressing   Problem: Clinical Measurements: Goal: Ability to maintain clinical measurements within normal limits will improve Outcome: Progressing

## 2024-05-13 NOTE — Plan of Care (Signed)
  Problem: Consults Goal: RH GENERAL PATIENT EDUCATION Description: See Patient Education module for education specifics. Outcome: Progressing   Problem: RH BOWEL ELIMINATION Goal: RH STG MANAGE BOWEL WITH ASSISTANCE Description: STG Manage Bowel with mod I  Assistance. Outcome: Progressing Goal: RH STG MANAGE BOWEL W/MEDICATION W/ASSISTANCE Description: STG Manage Bowel with Medication with mod I Assistance. Outcome: Progressing   Problem: RH PAIN MANAGEMENT Goal: RH STG PAIN MANAGED AT OR BELOW PT'S PAIN GOAL Description: < 4 with prns Outcome: Progressing   Problem: RH KNOWLEDGE DEFICIT GENERAL Goal: RH STG INCREASE KNOWLEDGE OF SELF CARE AFTER HOSPITALIZATION Description: Patient and family will be able to manage care at discharge using educational resources for medications, skin care, dietary modifications independently Outcome: Progressing

## 2024-05-13 NOTE — Progress Notes (Signed)
 Brandon Prentice BRAVO, MD  Physician Physical Medicine and Rehabilitation   PMR Pre-admission     Signed   Date of Service: 05/13/2024  9:51 AM  Related encounter: Admission (Discharged) from 04/30/2024 in [facility]   Signed     Expand All Collapse All  PMR Admission Coordinator Pre-Admission Assessment   Patient: Brandon Clark is an 83 y.o., male MRN: [id number] DOB: 29-Apr-1941 Height: 6' (182.9 cm) Weight: 122 kg   Insurance Information HMO:     PPO:      PCP:      IPA:      80/20:      OTHER:  PRIMARY: Worker's Comp      Policy#:       Subscriber:  CM Name: Brandon Clark      Phone#: [phone]     Fax#: [phone]  Pre-Cert#:  [id number] auth for CIR from [place] for admit 11/10 with next review date 11/17.  Updates due to [place] at fax listed above.       Employer:  Benefits:  Phone #:      Name:  Eff. Date:      Deduct:       Out of Pocket Max:       Life Max:  CIR:       SNF:  Outpatient:      Co-Pay:  Home Health:       Co-Pay:  DME:      Co-Pay:  Providers:  SECONDARY:       Policy#:      Phone#:    Artist:       Phone#:    The Data Processing Manager" for patients in Inpatient Rehabilitation Facilities with attached "Privacy Act Statement-Health Care Records" was provided and verbally reviewed with: Patient and Family   Emergency Contact Information Contact Information       Name Relation Home Work Mobile    Brandon Clark Granddaughter     [phone]    Brandon Clark [phone]   [phone]    Brandon Clark Daughter     [phone]         Other Contacts   None on File        Current Medical History  Patient Admitting Diagnosis: L TKA c/b PE with heart strain   History of Present Illness: Pt is an 83 y/o male with PMH of GERD, PUD, osteoarthritis, BPA, dementia, HLD, HTN, prediabetes, OSA not on CPAP who was admitted to [facility] on 04/30/24 for a planned TKA on the L  after failed management of a L knee injury from a fall at work.  He underwent TKA per Dr. Dalldorf on 10/28 and is WBAT.  Post operatively he developed dyspnea, dizziness, and new O2 requirement.  Hospitalist was consulted and workup revealed acute left peroneal vein DVT on 10/30 and was started on eliquis.  CTA chest showed acute PE with right heart strain and eliquis was continued.  TRH had initially signed off but on 11/5 pt developed sudden onset chest pain which was pressure like in nature and 6/10 in intensity so they were reconsulted.  Workup was negative for acute process.  Therapy ongoing and pt was recommended for CIR.    Patient's medical record from Brandon Clark has been reviewed by the rehabilitation admission coordinator and physician.   Past Medical History      Past Medical History:  Diagnosis Date   Acid reflux     Arthritis  BPH (benign prostatic hyperplasia)     BPPV (benign paroxysmal positional vertigo)     Dementia (HCC)     Hiatal hernia     Hyperlipidemia     Hypertension     Pre-diabetes     Sleep apnea      sometimes does not use d/t poor fitting of the mask   Ulcer            Has the patient had major surgery during 100 days prior to admission? Yes   Family History   family history includes Diabetes in his brother and mother.   Current Medications  Current Medications    Current Facility-Administered Medications:    acetaminophen  (TYLENOL ) tablet 325-650 mg, 325-650 mg, Oral, Q6H PRN, Nida, Andrew, PA-C   alum & mag hydroxide-simeth (MAALOX/MYLANTA) 200-200-20 MG/5ML suspension 30 mL, 30 mL, Oral, Q4H PRN, Nida, Andrew, PA-C, 30 mL at 05/02/24 1333   amitriptyline (ELAVIL) tablet 25 mg, 25 mg, Oral, QHS, Nida, Andrew, PA-C, 25 mg at 05/09/24 2149   [COMPLETED] apixaban (ELIQUIS) tablet 10 mg, 10 mg, Oral, BID, 10 mg at 05/08/24 2116 **FOLLOWED BY** apixaban (ELIQUIS) tablet 5 mg, 5 mg, Oral, BID, Chavez, Abigail, NP, 5 mg at 05/10/24 1054   bisacodyl   (DULCOLAX) EC tablet 5 mg, 5 mg, Oral, Daily PRN, Nida, Andrew, PA-C, 5 mg at 05/07/24 1041   Chlorhexidine  Gluconate Cloth 2 % PADS 6 each, 6 each, Topical, Daily, Dalldorf, Peter, MD, 6 each at 05/10/24 1055   diphenhydrAMINE  (BENADRYL ) 12.5 MG/5ML elixir 12.5-25 mg, 12.5-25 mg, Oral, Q4H PRN, Nida, Andrew, PA-C   docusate sodium (COLACE) capsule 100 mg, 100 mg, Oral, BID, Nida, Andrew, PA-C, 100 mg at 05/10/24 1054   donepezil  (ARICEPT ) tablet 10 mg, 10 mg, Oral, QHS, Nida, Andrew, PA-C, 10 mg at 05/09/24 2150   feeding supplement (BOOST / RESOURCE BREEZE) liquid 1 Container, 1 Container, Oral, TID BM, Ortiz, David Manuel, MD, 1 Container at 05/10/24 1055   feeding supplement (ENSURE SURGERY) liquid 237 mL, 237 mL, Oral, BID BM, Nida, Andrew, PA-C, 237 mL at 05/10/24 1055   finasteride  (PROSCAR ) tablet 5 mg, 5 mg, Oral, Daily, Nida, Andrew, PA-C, 5 mg at 05/10/24 1053   hydrochlorothiazide  (HYDRODIURIL ) tablet 12.5 mg, 12.5 mg, Oral, Daily, Nida, Andrew, PA-C, 12.5 mg at 05/10/24 1053   insulin aspart (novoLOG) injection 0-15 Units, 0-15 Units, Subcutaneous, TID WC, Daniels, James K, NP, 2 Units at 05/10/24 1249   insulin aspart (novoLOG) injection 0-5 Units, 0-5 Units, Subcutaneous, QHS, Daniels, James K, NP   lactated ringers  bolus 250 mL, 250 mL, Intravenous, Once, Lenis Barter, PA-C   lactated ringers  bolus 250 mL, 250 mL, Intravenous, Once, Lenis Barter, PA-C   lactated ringers  bolus 500 mL, 500 mL, Intravenous, Once, Lenis Barter, PA-C   lactated ringers  infusion, , Intravenous, Continuous, Nida, Andrew, PA-C, Stopped at 05/01/24 1226   memantine (NAMENDA) tablet 10 mg, 10 mg, Oral, BID, Nida, Andrew, PA-C, 10 mg at 05/10/24 1054   menthol (CEPACOL) lozenge 3 mg, 1 lozenge, Oral, PRN **OR** phenol (CHLORASEPTIC) mouth spray 1 spray, 1 spray, Mouth/Throat, PRN, Nida, Andrew, PA-C   methocarbamol  (ROBAXIN ) tablet 750 mg, 750 mg, Oral, Q6H PRN, 750 mg at 05/10/24 1053 **OR** methocarbamol   (ROBAXIN ) injection 500 mg, 500 mg, Intravenous, Q6H PRN, Nida, Andrew, PA-C, 500 mg at 05/06/24 0057   metoCLOPramide (REGLAN) tablet 5-10 mg, 5-10 mg, Oral, Q8H PRN **OR** metoCLOPramide (REGLAN) injection 5-10 mg, 5-10 mg, Intravenous, Q8H PRN, Nida, Andrew, PA-C, 10  mg at 05/06/24 1643   morphine (PF) 2 MG/ML injection 0.5-1 mg, 0.5-1 mg, Intravenous, Q2H PRN, Nida, Andrew, PA-C, 1 mg at 05/08/24 1720   oxyCODONE -acetaminophen  (PERCOCET/ROXICET) 5-325 MG per tablet 1-2 tablet, 1-2 tablet, Oral, Q6H PRN, Nida, Andrew, PA-C, 2 tablet at 05/10/24 1054   polyethylene glycol (MIRALAX / GLYCOLAX) packet 17 g, 17 g, Oral, BID, Ghimire, Kuber, MD, 17 g at 05/10/24 1055   simvastatin  (ZOCOR ) tablet 10 mg, 10 mg, Oral, QHS, Nida, Andrew, PA-C, 10 mg at 05/09/24 2149   traZODone  (DESYREL ) tablet 50 mg, 50 mg, Oral, QHS PRN, Nida, Andrew, PA-C, 50 mg at 05/08/24 2219     Patients Current Diet:  Diet Order                  Diet heart healthy/carb modified Fluid consistency: Thin  Diet effective now             Diet - low sodium heart healthy             Diet - low sodium heart healthy                         Precautions / Restrictions Precautions Precautions: Knee, Fall Precaution Booklet Issued: No Precaution/Restrictions Comments: very painful Left leg with WB attempts Restrictions Weight Bearing Restrictions Per Provider Order: Yes LLE Weight Bearing Per Provider Order: Weight bearing as tolerated    Has the patient had 2 or more falls or a fall with injury in the past year? No   Prior Activity Level Community (5-7x/wk): prior to injury in June pt was independent and working in production designer, theatre/television/film at [employer], no DME prior to injury   Prior Functional Level Self Care: Did the patient need help bathing, dressing, using the toilet or eating? Independent   Indoor Mobility: Did the patient need assistance with walking from room to room (with or without device)? Independent   Stairs: Did  the patient need assistance with internal or external stairs (with or without device)? Independent   Functional Cognition: Did the patient need help planning regular tasks such as shopping or remembering to take medications? Independent   Patient Information Are you of Hispanic, Latino/a,or Spanish origin?: A. No, not of Hispanic, Latino/a, or Spanish origin What is your race?: A. White Do you need or want an interpreter to communicate with a doctor or health care staff?: 1. Yes   Patient's Response To:  Health Literacy and Transportation Is the patient able to respond to health literacy and transportation needs?: Yes Health Literacy - How often do you need to have someone help you when you read instructions, pamphlets, or other written material from your doctor or pharmacy?: Often (interpreter needed if in English) In the past 12 months, has lack of transportation kept you from medical appointments or from getting medications?: No In the past 12 months, has lack of transportation kept you from meetings, work, or from getting things needed for daily living?: No   Home Assistive Devices / Equipment Home Equipment: Agricultural Consultant (2 wheels), [employer] - single point, Information systems manager   Prior Device Use: Indicate devices/aids used by the patient prior to current illness, exacerbation or injury? Since injury over the summer has been using a RW, but prior to that was fully independent with no DME   Current Functional Level Cognition   Orientation Level: Oriented X4    Extremity Assessment (includes Sensation/Coordination)   Upper Extremity Assessment: Generalized weakness  Lower  Extremity Assessment: Defer to PT evaluation RLE Deficits / Details: ROM WFL; MMT 5/5 LLE Deficits / Details: Post op leg.  Significantly limited by pain.  Pt with more c/o severe pain/cramping in L calf.  He is moaning and guarding.  Pt yelling and withdrawing with even light touch to calf and with ankle DF/PF.  He does have  pain in knee with attempts to weight bear.  ROM limited by pain ~10 to 30 degrees but not further testing other than with transfers.  MMT: at least 1/5 throughout but not further tested due to pain     ADLs   Overall ADL's : Needs assistance/impaired Eating/Feeding: Set up, Sitting Grooming: Wash/dry face, Set up, Sitting Upper Body Bathing: Set up, Sitting Lower Body Bathing: Maximal assistance, Sitting/lateral leans, +2 for safety/equipment Upper Body Dressing : Minimal assistance, Sitting Lower Body Dressing: Total assistance, Sitting/lateral leans Toilet Transfer: Cueing for safety, Cueing for sequencing, Moderate assistance, +2 for safety/equipment (platform walker) Toilet Transfer Details (indicate cue type and reason): Simulated toilet t/f into the recliner Toileting- Clothing Manipulation and Hygiene: Maximal assistance, +2 for safety/equipment, Sit to/from stand Functional mobility during ADLs: Moderate assistance, +2 for safety/equipment, +2 for physical assistance (platform walker) General ADL Comments: TOTAL don/doff LB dressing items, MODA+2 simulated toilet transfer into recliner     Mobility   Overal bed mobility: Needs Assistance Bed Mobility: Sit to Supine Supine to sit: Mod assist Sit to supine: Mod assist General bed mobility comments: mod assist to place LLE ont bed, toleated  like a SLR to lft the leg     Transfers   Overall transfer level: Needs assistance Equipment used: Rolling walker (2 wheels) Transfers: Sit to/from Stand, Bed to chair/wheelchair/BSC Sit to Stand: +2 physical assistance, +2 safety/equipment, From elevated surface, Mod assist Bed to/from chair/wheelchair/BSC transfer type:: Step pivot Step pivot transfers: Mod assist, +2 physical assistance, +2 safety/equipment Transfer via Lift Equipment: [place] General transfer comment: mod assist of 2 to stand at RW from [facility], LLE tended to be too far ahead, Assisted to stand more erct and slide LLE back to  provide some support. Right knee tending to  flex. patient took 1 hop step backwards but felth patient was unsteady, bed was brought up and patient  sat down.     Ambulation / Gait / Stairs / Wheelchair Mobility   Ambulation/Gait Ambulation/Gait assistance: Min assist, +2 safety/equipment Gait Distance (Feet): 4 Feet Assistive device: Rolling walker (2 wheels) Gait Pattern/deviations: Step-to pattern, Decreased stride length, Decreased weight shift to left, Shuffle General Gait Details: Pt was able to bear some weight on L LE and take a few steps with chair follow; min A of 2 for safety ; pt self advancing L LE today Gait velocity: decreased     Posture / Balance Dynamic Sitting Balance Sitting balance - Comments: UE support,, mod A , reports some disZziness when returns to supine Balance Overall balance assessment: Needs assistance Sitting-balance support: No upper extremity supported Sitting balance-Leahy Scale: Fair Sitting balance - Comments: UE support,, mod A , reports some disZziness when returns to supine Standing balance support: Bilateral upper extremity supported, Reliant on assistive device for balance, During functional activity Standing balance-Leahy Scale: Poor Standing balance comment: RW and mod, not straightening right knee, decreased WB     Special considerations/life events  Skin L TKA and Diabetic management yes    Previous Home Environment (from acute therapy documentation) Living Arrangements: Children Available Help at Discharge: Family, Available 24 hours/day Type of  Home: House Home Layout: Multi-level Alternate Level Stairs-Rails: Right Alternate Level Stairs-Number of Steps: Pt typically stays upstairs but has full bath downstairs and can convert office to bedroom if needed.  Full flight of steps Home Access: Stairs to enter Entrance Stairs-Number of Steps: 1 threshold Bathroom Shower/Tub: Engineer, Manufacturing Systems: Standard Home Care Services:  No   Discharge Living Setting Plans for Discharge Living Setting: Patient's home, Lives with (comment) (large family) Type of Home at Discharge: House Discharge Home Layout: Multi-level, Able to live on main level with bedroom/bathroom Alternate Level Stairs-Rails: Left Alternate Level Stairs-Number of Steps: full flight Discharge Home Access: Stairs to enter Entrance Stairs-Rails: None Entrance Stairs-Number of Steps: 1 Discharge Bathroom Shower/Tub: Walk-in shower Discharge Bathroom Toilet: Standard Discharge Bathroom Accessibility: Yes How Accessible: Accessible via walker Does the patient have any problems obtaining your medications?: No   Social/Family/Support Systems Anticipated Caregiver: granddaughter is best contact Anticipated Industrial/product Designer Information: [name] [phone] Ability/Limitations of Caregiver: none stated Caregiver Availability: 24/7 Discharge Plan Discussed with Primary Caregiver: Yes Is Caregiver In Agreement with Plan?: Yes Does Caregiver/Family have Issues with Lodging/Transportation while Pt is in Rehab?: No   Goals Patient/Family Goal for Rehab: PT/OT supervision to min assist, SLP n/a Expected length of stay: 14-16 days Cultural Considerations: Needs arabic interpreter Additional Information: Discharge plan: home with family who can provide 24/7 assist (large family) Pt/Family Agrees to Admission and willing to participate: Yes Program Orientation Provided & Reviewed with Pt/Caregiver Including Roles  & Responsibilities: Yes   Decrease burden of Care through IP rehab admission: n/a   Possible need for SNF placement upon discharge:  Not anticipated.  Plan for discharge to previously living environment with family.  Pt will stay on the first floor with 1 step and no rails to enter.  Family can provide 24/7 supervision/assist.    Patient Condition: I have reviewed medical records from [facility], spoken with [facility] team, and patient and family  member. I met with patient at the bedside and discussed via phone for inpatient rehabilitation assessment.  Patient will benefit from ongoing PT and OT, can actively participate in 3 hours of therapy a day 5 days of the week, and can make measurable gains during the admission.  Patient will also benefit from the coordinated team approach during an Inpatient Acute Rehabilitation admission.  The patient will receive intensive therapy as well as Rehabilitation physician, nursing, social worker, and care management interventions.  Due to safety, skin/wound care, disease management, medication administration, pain management, and patient education the patient requires 24 hour a day rehabilitation nursing.  The patient is currently mod assist with mobility and basic ADLs.  Discharge setting and therapy post discharge at home with home health is anticipated.  Patient has agreed to participate in the Acute Inpatient Rehabilitation Program and will admit today.   Preadmission Screen Completed By:  Donnel Venuto E Jasmen Emrich, PT, DPT, 05/10/2024 2:00 PM ______________________________________________________________________   Discussed status with Dr. Carilyn on 05/10/24  at 2:24 PM  and received approval for admission today.   Admission Coordinator:  Lyrika Souders E Aava Deland, PT, DPT time 2:24 PM Pattricia  05/10/24     Assessment/Plan: Diagnosis: debility after Pulmonary embolism Does the need for close, 24 hr/day Medical supervision in concert with the patient's rehab needs make it unreasonable for this patient to be served in a less intensive setting? 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 Yes Does the patient require coordinated care of a physician, rehab nurse, PT, OT, and SLP to  address physical and functional deficits in the context of the above medical diagnosis(es)? Yes Addressing deficits in the following areas: balance, endurance, locomotion, strength, transferring, bowel/bladder control, bathing, dressing, feeding, grooming, toileting, and psychosocial support Can the patient actively participate in an intensive therapy program of at least 3 hrs of therapy 5 days a week? Yes The potential for patient to make measurable gains while on inpatient rehab is good Anticipated functional outcomes upon discharge from inpatient rehab: min assist PT, min assist OT, min assist SLP Estimated rehab length of stay to reach the above functional goals is: 14-16d Anticipated discharge destination: Home 10. Overall Rehab/Functional Prognosis: good     MD Signature: Prentice CHARLENA Compton M.D. [facility] Fellow Am Acad of Phys Med and Rehab Diplomate Am Board of Electrodiagnostic Med Fellow Am Board of Interventional Pain            Revision History

## 2024-05-14 DIAGNOSIS — I2699 Other pulmonary embolism without acute cor pulmonale: Secondary | ICD-10-CM | POA: Diagnosis not present

## 2024-05-14 LAB — COMPREHENSIVE METABOLIC PANEL WITH GFR
ALT: 70 U/L — ABNORMAL HIGH (ref 0–44)
AST: 46 U/L — ABNORMAL HIGH (ref 15–41)
Albumin: 2.7 g/dL — ABNORMAL LOW (ref 3.5–5.0)
Alkaline Phosphatase: 208 U/L — ABNORMAL HIGH (ref 38–126)
Anion gap: 11 (ref 5–15)
BUN: 16 mg/dL (ref 8–23)
CO2: 28 mmol/L (ref 22–32)
Calcium: 8.7 mg/dL — ABNORMAL LOW (ref 8.9–10.3)
Chloride: 97 mmol/L — ABNORMAL LOW (ref 98–111)
Creatinine, Ser: 0.95 mg/dL (ref 0.61–1.24)
GFR, Estimated: >60 mL/min (ref >60)
Glucose, Bld: 135 mg/dL — ABNORMAL HIGH (ref 70–99)
Potassium: 4.4 mmol/L (ref 3.5–5.1)
Sodium: 136 mmol/L (ref 135–145)
Total Bilirubin: 1.5 mg/dL — ABNORMAL HIGH (ref 0.0–1.2)
Total Protein: 5.9 g/dL — ABNORMAL LOW (ref 6.5–8.1)

## 2024-05-14 LAB — CBC WITH DIFFERENTIAL/PLATELET
Abs Immature Granulocytes: 0.32 K/uL — ABNORMAL HIGH (ref 0.00–0.07)
Basophils Absolute: 0.1 K/uL (ref 0.0–0.1)
Basophils Relative: 2 %
Eosinophils Absolute: 0.4 K/uL (ref 0.0–0.5)
Eosinophils Relative: 5 %
HCT: 33.7 % — ABNORMAL LOW (ref 39.0–52.0)
Hemoglobin: 11.2 g/dL — ABNORMAL LOW (ref 13.0–17.0)
Immature Granulocytes: 4 %
Lymphocytes Relative: 28 %
Lymphs Abs: 2.2 K/uL (ref 0.7–4.0)
MCH: 29.1 pg (ref 26.0–34.0)
MCHC: 33.2 g/dL (ref 30.0–36.0)
MCV: 87.5 fL (ref 80.0–100.0)
Monocytes Absolute: 1.1 K/uL — ABNORMAL HIGH (ref 0.1–1.0)
Monocytes Relative: 14 %
Neutro Abs: 3.6 K/uL (ref 1.7–7.7)
Neutrophils Relative %: 47 %
Platelets: 467 K/uL — ABNORMAL HIGH (ref 150–400)
RBC: 3.85 MIL/uL — ABNORMAL LOW (ref 4.22–5.81)
RDW: 13.3 % (ref 11.5–15.5)
WBC: 7.8 K/uL (ref 4.0–10.5)
nRBC: 0 % (ref 0.0–0.2)

## 2024-05-14 LAB — GLUCOSE, CAPILLARY
Glucose-Capillary: 130 mg/dL — ABNORMAL HIGH (ref 70–99)
Glucose-Capillary: 147 mg/dL — ABNORMAL HIGH (ref 70–99)
Glucose-Capillary: 142 mg/dL — ABNORMAL HIGH (ref 70–99)
Glucose-Capillary: 125 mg/dL — ABNORMAL HIGH (ref 70–99)

## 2024-05-14 MED ORDER — OXYCODONE-ACETAMINOPHEN 5-325 MG PO TABS
2.0000 | ORAL_TABLET | Freq: Three times a day (TID) | ORAL | Status: DC
Start: 2024-05-14 — End: 2024-05-16
  Administered 2024-05-14 – 2024-05-16 (×6): 2 via ORAL
  Filled 2024-05-14 (×6): qty 2

## 2024-05-14 MED ORDER — TRAMADOL HCL 50 MG PO TABS: 50.0000 mg | ORAL_TABLET | Freq: Four times a day (QID) | ORAL | Status: DC | PRN

## 2024-05-14 MED ORDER — TRAMADOL HCL 50 MG PO TABS
50.0000 mg | ORAL_TABLET | Freq: Four times a day (QID) | ORAL | Status: DC | PRN
  Administered 2024-05-14 – 2024-05-22 (×8): 50 mg via ORAL
  Filled 2024-05-14 (×9): qty 1

## 2024-05-14 MED ORDER — BUSPIRONE HCL 10 MG PO TABS
5.0000 mg | ORAL_TABLET | Freq: Two times a day (BID) | ORAL | Status: DC | PRN
  Administered 2024-05-14: 5 mg via ORAL
  Filled 2024-05-14: qty 1

## 2024-05-14 NOTE — Progress Notes (Signed)
 Inpatient Rehabilitation  Patient information reviewed and entered into eRehab system by [facility]. Karen Kays., CCC/SLP, PPS Coordinator.  Information including medical coding, functional ability and quality indicators will be reviewed and updated through discharge.

## 2024-05-14 NOTE — Evaluation (Signed)
 Physical Therapy Assessment and Plan  Patient Details  Name: Brandon Clark MRN: [id number] Date of Birth: Oct 13, 1940  PT Diagnosis: Abnormal posture, Abnormality of gait, Difficulty walking, Dizziness and giddiness, Edema, Impaired cognition, Muscle weakness, Osteoarthritis, Pain in joint, and Pain in LLE Rehab Potential: Good ELOS: 7-10 days   Today's Date: 05/14/2024 PT Individual Time: 1300-1410 PT Individual Time Calculation (min): 70 min    Hospital Problem: Principal Problem:   Pulmonary emboli (HCC)   Past Medical History:  Past Medical History:  Diagnosis Date   Acid reflux    Arthritis    BPH (benign prostatic hyperplasia)    BPPV (benign paroxysmal positional vertigo)    Dementia (HCC)    Hiatal hernia    Hyperlipidemia    Hypertension    Pre-diabetes    Sleep apnea    sometimes does not use d/t poor fitting of the mask   Ulcer    Past Surgical History:  Past Surgical History:  Procedure Laterality Date   ESOPHAGEAL MANOMETRY N/A 04/27/2016   Procedure: ESOPHAGEAL MANOMETRY (EM) with PH;  Surgeon: Rogelia Copping, MD;  Location: [facility] ENDOSCOPY;  Service: Endoscopy;  Laterality: N/A;   ESOPHAGEAL MANOMETRY N/A 04/19/2017   Procedure: ESOPHAGEAL MANOMETRY (EM);  Surgeon: Dianna Specking, MD;  Location: [facility];  Service: Endoscopy;  Laterality: N/A;   FACIAL LACERATIONS REPAIR     INSERTION OF MESH N/A 06/02/2017   Procedure: INSERTION OF MESH;  Surgeon: Rubin Calamity, MD;  Location: [facility];  Service: General;  Laterality: N/A;   TOTAL KNEE ARTHROPLASTY Left 04/30/2024   Procedure: ARTHROPLASTY, KNEE, TOTAL;  Surgeon: Sheril Coy, MD;  Location: [facility];  Service: Orthopedics;  Laterality: Left;    Assessment & Plan Clinical Impression: Patient is a 83 y.o. year old male with with history of BPH, dementia maintained on Aricept  as well as Namenda, hyperlipidemia, hypertension, sleep apnea does not use CPAP, prediabetes, BPV, hiatal hernia  status post Nissen fundoplication procedure. Per chart review patient lives with children. Multilevel home and can reside on the main level. Modified independent with a straight point cane prior to admission. Presented 04/30/2024 undergoing left total knee replacement for osteoarthritis per Dr. Daldorf and weightbearing as tolerated and placed on Eliquis for DVT prophylaxis. Postoperatively patient with dyspnea, dizziness and new oxygen requirement. CT angio of the chest showed suspected nonocclusive pulmonary emboli in the segmental branches of the right upper lobe and additional occlusive pulmonary embolism in a segmental branch of lingular artery. Evidence of right heart strain with RV/LV ratio 1.3. 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 Venous Doppler study showed DVT left peroneal vein. Echocardiogram completed did not show any significant heart strain and EF of 60 to 65%. It was initially suggested switch to heparin  drip but because of patient's faith/religion, he would not want to take pork products including heparin  thus his Eliquis was continued. Patient did have some initial pleuritic chest pain hypoxemia has since resolved encouragement of incentive spirometer. Therapy evaluations completed due to patient's decreased functional mobility was admitted for a comprehensive rehab program.   Patient currently requires min with mobility secondary to muscle weakness and muscle joint tightness, decreased cardiorespiratoy endurance, decreased problem solving, decreased safety awareness, and decreased memory, and decreased standing balance, decreased postural control, and decreased balance strategies.  Prior to hospitalization, patient was independent  with mobility and lived with Son in a House home.  Home access is 1 STE, then 10+landing+10 steps to get to his bedroom with 2 handrailsStairs to enter.  Patient will benefit from skilled PT intervention to maximize safe functional mobility, minimize fall  risk, and decrease caregiver burden for planned discharge home with intermittent assist.  Anticipate patient will benefit from follow up OP at discharge.  PT - End of Session Activity Tolerance: Tolerates 30+ min activity with multiple rests Endurance Deficit: Yes Endurance Deficit Description: SOB with activity and required frequent rest breaks PT Assessment Rehab Potential (ACUTE/IP ONLY): Good PT Barriers to Discharge: Decreased caregiver support;Home environment access/layout;Wound Care;Behavior PT Barriers to Discharge Comments: baseline dementia, son works during day, 10+10 steps to get to bedroom/bathroom PT Patient demonstrates impairments in the following area(s): Balance;Edema;Endurance;Nutrition;Pain;Skin Integrity;Perception PT Transfers Functional Problem(s): Bed Mobility;Bed to Chair;Car;Furniture PT Locomotion Functional Problem(s): Ambulation;Wheelchair Mobility;Stairs PT Plan PT Intensity: Minimum of 1-2 x/day ,45 to 90 minutes PT Frequency: 5 out of 7 days PT Duration Estimated Length of Stay: 7-10 days PT Treatment/Interventions: Ambulation/gait training;Discharge planning;Functional mobility training;Psychosocial support;Therapeutic Activities;Balance/vestibular training;Disease management/prevention;Neuromuscular re-education;Skin care/wound management;Therapeutic Exercise;Wheelchair propulsion/positioning;Cognitive remediation/compensation;DME/adaptive equipment instruction;Pain management;UE/LE Strength taining/ROM;Community reintegration;Patient/family education;Stair training;UE/LE Coordination activities PT Transfers Anticipated Outcome(s): Mod I with LRAD PT Locomotion Anticipated Outcome(s): Mod I with LRAD PT Recommendation Follow Up Recommendations: Outpatient PT Patient destination: Home Equipment Recommended: To be determined Equipment Details: has RW and [facility]   PT Evaluation Precautions/Restrictions Precautions Precautions: Knee;Fall Precaution Booklet  Issued: Yes (comment) Recall of Precautions/Restrictions: Impaired Precaution/Restrictions Comments: no pillow under knee Restrictions Weight Bearing Restrictions Per Provider Order: No LLE Weight Bearing Per Provider Order: Weight bearing as tolerated Pain Interference Pain Interference Pain Effect on Sleep: 4. Almost constantly Pain Interference with Therapy Activities: 4. Almost constantly Pain Interference with Day-to-Day Activities: 4. Almost constantly Home Living/Prior Functioning Home Living Available Help at Discharge: Family;Available PRN/intermittently Type of Home: House Home Access: Stairs to enter Entrance Stairs-Number of Steps: 1 STE, then 10+landing+10 steps to get to his bedroom with 2 handrails Entrance Stairs-Rails: Right;Left;Can reach both Home Layout: Multi-level Alternate Level Stairs-Number of Steps: 20 stairs Alternate Level Stairs-Rails: Right;Left;Can reach both Bathroom Shower/Tub: Health Visitor: Standard Bathroom Accessibility: Yes Additional Comments: has RW and SPC at home. Prior to sugery wasn't using AD  Lives With: Son Prior Function Level of Independence: Independent with basic ADLs;Independent with homemaking with ambulation;Independent with gait;Independent with transfers  Able to Take Stairs?: Yes Driving: Yes Vocation: Full time employment Vision/Perception  Vision - History Ability to See in Adequate Light: 0 Adequate Perception Perception: Within Functional Limits Praxis Praxis: WFL  Cognition Overall Cognitive Status: History of cognitive impairments - at baseline Arousal/Alertness: Awake/alert Orientation Level: Oriented X4 Year: 2025 Month: November Day of Week: Correct Attention: Sustained Sustained Attention: Appears intact Memory: Impaired Memory Impairment: Retrieval deficit;Decreased short term memory Decreased Short Term Memory: Verbal complex;Functional complex Awareness: Impaired Awareness  Impairment: Emergent impairment Problem Solving: Impaired Problem Solving Impairment: Verbal complex;Functional complex Safety/Judgment: Appears intact Sensation Sensation Light Touch: Appears Intact Hot/Cold: Not tested Proprioception: Appears Intact Stereognosis: Not tested Coordination Gross Motor Movements are Fluid and Coordinated: Yes Fine Motor Movements are Fluid and Coordinated: Yes Coordination and Movement Description: limited by pain, weakness, and decreased L knee ROM Heel Shin Test: not tested Motor  Motor Motor: Within Functional Limits  Trunk/Postural Assessment  Cervical Assessment Cervical Assessment: Exceptions to [facility] (mild forward head) Thoracic Assessment Thoracic Assessment: Exceptions to [facility] (thoracic rounding) Lumbar Assessment Lumbar Assessment: Exceptions to [facility] (posterior pelvic tilt) Postural Control Postural Control: Within Functional Limits  Balance Balance Balance Assessed: Yes Static Sitting Balance Static Sitting - Balance Support: Feet  supported;Bilateral upper extremity supported Static Sitting - Level of Assistance: 5: Stand by assistance (supervision) Dynamic Sitting Balance Dynamic Sitting - Balance Support: Feet supported;No upper extremity supported Dynamic Sitting - Level of Assistance: 5: Stand by assistance (supervision) Dynamic Sitting - Balance Activities: Reaching for weighted objects;Reaching across midline;Lateral lean/weight shifting Static Standing Balance Static Standing - Balance Support: Bilateral upper extremity supported;During functional activity (RW) Static Standing - Level of Assistance: 5: Stand by assistance (CGA) Dynamic Standing Balance Dynamic Standing - Balance Support: Bilateral upper extremity supported;During functional activity (RW) Dynamic Standing - Level of Assistance: 4: Min assist Dynamic Standing - Balance Activities: Reaching across midline;Reaching for objects;Forward lean/weight shifting Dynamic  Standing - Comments: with transfers and ambulation Extremity Assessment  RLE Assessment RLE Assessment: Exceptions to [facility] Strength Comments: tested sitting EOB RLE Strength Right Hip Flexion: 4-/5 Right Hip ABduction: 4-/5 Right Hip ADduction: 4-/5 Right Knee Flexion: 4-/5 Right Knee Extension: 3+/5 Right Ankle Dorsiflexion: 4/5 Right Ankle Plantar Flexion: 4/5 LLE Assessment LLE Assessment: Exceptions to [facility] Strength Comments: tested sitting EOB - limited by pain and tightness in calf LLE Strength Left Hip Flexion: 3-/5 Left Hip ABduction: 3-/5 Left Hip ADduction: 3-/5 Left Knee Flexion: 3-/5 Left Ankle Dorsiflexion: 3+/5 Left Ankle Plantar Flexion: 3+/5  Care Tool Care Tool Bed Mobility Roll left and right activity   Roll left and right assist level: Supervision/Verbal cueing    Sit to lying activity   Sit to lying assist level: Contact Guard/Touching assist    Lying to sitting on side of bed activity   Lying to sitting on side of bed assist level: the ability to move from lying on the back to sitting on the side of the bed with no back support.: Supervision/Verbal cueing     Care Tool Transfers Sit to stand transfer   Sit to stand assist level: Minimal Assistance - Patient > 75%    Chair/bed transfer   Chair/bed transfer assist level: Minimal Assistance - Patient > 75%    Car transfer   Car transfer assist level: Minimal Assistance - Patient > 75%      Care Tool Locomotion Ambulation   Assist level: Minimal Assistance - Patient > 75% Assistive device: Walker-rolling Max distance: 20ft  Walk 10 feet activity   Assist level: Minimal Assistance - Patient > 75% Assistive device: Walker-rolling   Walk 50 feet with 2 turns activity Walk 50 feet with 2 turns activity did not occur: Safety/medical concerns (pain, dizziness)      Walk 150 feet activity Walk 150 feet activity did not occur: Safety/medical concerns (pain, dizziness)      Walk 10  feet on uneven surfaces activity Walk 10 feet on uneven surfaces activity did not occur: Safety/medical concerns (pain, dizziness)      Stairs Stair activity did not occur: Safety/medical concerns (pain, dizziness)        Walk up/down 1 step activity Walk up/down 1 step or curb (drop down) activity did not occur: Safety/medical concerns (pain, dizziness)      Walk up/down 4 steps activity Walk up/down 4 steps activity did not occur: Safety/medical concerns (pain, dizziness)      Walk up/down 12 steps activity Walk up/down 12 steps activity did not occur: Safety/medical concerns (pain, dizziness)      Pick up small objects from floor Pick up small object from the floor (from standing position) activity did not occur: Safety/medical concerns (pain, dizziness)      Wheelchair Is the patient using a  wheelchair?: Yes Type of Wheelchair: Manual   Wheelchair assist level: Dependent - Patient 0% Max wheelchair distance: 52ft  Wheel 50 feet with 2 turns activity   Assist Level: Dependent - Patient 0%  Wheel 150 feet activity Wheelchair 150 feet activity did not occur: Safety/medical concerns      Refer to Care Plan for Long Term Goals  SHORT TERM GOAL WEEK 1 PT Short Term Goal 1 (Week 1): STG=LTG due to LOS  Recommendations for other services: None   Skilled Therapeutic Intervention Evaluation completed (see details above and below) with education on PT POC and goals and individual treatment initiated with focus on functional mobility/transfers, generalized strengthening and endurance, dynamic standing balance/coordination, simulated car transfers, and ambulation. Received pt semi-reclined in bed, pt educated on PT evaluation, CIR policies, and therapy schedule and agreeable. Pt reported pain 8/10 in LLE - RN notified and present to adminster medication.   Provided pt with 20x18 manual WC and L elevating legrest. Pt transferred semi-reclined<>sitting L EOB with HOB elevated and use of  bedrails with supervision. Stood from elevated EOB with RW and min A and ambulated 7ft with RW and CGA/min A with WC follow - limited by pain and dizziness. BP seated 125/83 and standing 121/107 - symptoms resolved with ++ time. In ortho gym, pt performed ambulatory simulated car transfer with RW and min A for LLE management with cues for technique. Pt then ambulated 12ft with RW and CGA/min A. Returned to room and concluded session with pt sitting in WC, needs within reach, and seatbelt alarm on. Safety plan updated and CSW at bedside.   Mobility Bed Mobility Bed Mobility: Rolling Right;Rolling Left;Supine to Sit Rolling Right: Supervision/verbal cueing Rolling Left: Supervision/Verbal cueing Left Sidelying to Sit: Contact Guard/Touching assist Supine to Sit: Supervision/Verbal cueing Sit to Supine: Minimal Assistance - Patient > 75% Transfers Transfers: Sit to Stand;Stand to Sit;Stand Pivot Transfers Sit to Stand: Minimal Assistance - Patient > 75% Stand to Sit: Contact Guard/Touching assist Stand Pivot Transfers: Minimal Assistance - Patient > 75% Stand Pivot Transfer Details: Verbal cues for technique Stand Pivot Transfer Details (indicate cue type and reason): verbal cues to kick LL out prior to sitting Transfer (Assistive device): Rolling walker Locomotion  Gait Ambulation: Yes Gait Assistance: Minimal Assistance - Patient > 75% Gait Distance (Feet): 23 Feet Assistive device: Rolling walker Gait Assistance Details: Verbal cues for gait pattern;Verbal cues for technique;Verbal cues for safe use of DME/AE Gait Assistance Details: verbal cues for RW safety and for weight shifiting onto LLE Gait Gait: Yes Gait Pattern: Impaired Gait Pattern: Step-to pattern;Decreased step length - right;Decreased step length - left;Decreased stance time - left;Decreased stride length;Decreased dorsiflexion - left;Decreased weight shift to left;Antalgic;Trunk flexed;Narrow base of support;Poor foot  clearance - left;Poor foot clearance - right Gait velocity: decreased Stairs / Additional Locomotion Stairs: No Corporate Treasurer: Yes Wheelchair Assistance: Dependent - Patient 0% Wheelchair Parts Management: Needs assistance Distance: 20ft   Discharge Criteria: Patient will be discharged from PT if patient refuses treatment 3 consecutive times without medical reason, if treatment goals not met, if there is a change in medical status, if patient makes no progress towards goals or if patient is discharged from hospital.  The above assessment, treatment plan, treatment alternatives and goals were discussed and mutually agreed upon: by patient  Tanesia Butner M Zaunegger Jhalil Silvera Zaunegger PT, DPT 05/14/2024, 2:41 PM

## 2024-05-14 NOTE — Plan of Care (Signed)
  Problem: RH Eating Goal: LTG Patient will perform eating w/assist, cues/equip (OT) Description: LTG: Patient will perform eating with assist, with/without cues using equipment (OT) Flowsheets (Taken 05/14/2024 1658) LTG: Pt will perform eating with assistance level of: Independent   Problem: RH Grooming Goal: LTG Patient will perform grooming w/assist,cues/equip (OT) Description: LTG: Patient will perform grooming with assist, with/without cues using equipment (OT) Flowsheets (Taken 05/14/2024 1658) LTG: Pt will perform grooming with assistance level of: Independent   Problem: RH Bathing Goal: LTG Patient will bathe all body parts with assist levels (OT) Description: LTG: Patient will bathe all body parts with assist levels (OT) Flowsheets (Taken 05/14/2024 1658) LTG: Pt will perform bathing with assistance level/cueing: Independent with assistive device    Problem: RH Dressing Goal: LTG Patient will perform upper body dressing (OT) Description: LTG Patient will perform upper body dressing with assist, with/without cues (OT). Flowsheets (Taken 05/14/2024 1658) LTG: Pt will perform upper body dressing with assistance level of: Independent with assistive device   Problem: RH Toileting Goal: LTG Patient will perform toileting task (3/3 steps) with assistance level (OT) Description: LTG: Patient will perform toileting task (3/3 steps) with assistance level (OT)  Flowsheets (Taken 05/14/2024 1658) LTG: Pt will perform toileting task (3/3 steps) with assistance level: Independent with assistive device   Problem: RH Toilet Transfers Goal: LTG Patient will perform toilet transfers w/assist (OT) Description: LTG: Patient will perform toilet transfers with assist, with/without cues using equipment (OT) Flowsheets (Taken 05/14/2024 1658) LTG: Pt will perform toilet transfers with assistance level of: Independent with assistive device   Problem: RH Tub/Shower Transfers Goal: LTG Patient  will perform tub/shower transfers w/assist (OT) Description: LTG: Patient will perform tub/shower transfers with assist, with/without cues using equipment (OT) Flowsheets (Taken 05/14/2024 1658) LTG: Pt will perform tub/shower stall transfers with assistance level of: Independent with assistive device

## 2024-05-14 NOTE — Evaluation (Signed)
 Occupational Therapy Assessment and Plan  Patient Details  Name: Brandon Clark MRN: [id number] Date of Birth: 10/10/1940  OT Diagnosis: acute pain, cognitive deficits, muscle weakness (generalized), pain in joint, and swelling of limb Rehab Potential: Rehab Potential (ACUTE ONLY): Fair ELOS: 7-10 days    Today's Date: 05/14/2024 OT Individual Time: 0915-1000 OT Individual Time Calculation (min): 45 min  OT missed time: 15 min Missed time reason: Delay in care d/t OT transitioning with previous session. OT to make up time when able   Hospital Problem: Principal Problem:   Pulmonary emboli [facility])   Past Medical History:  Past Medical History:  Diagnosis Date   Acid reflux    Arthritis    BPH (benign prostatic hyperplasia)    BPPV (benign paroxysmal positional vertigo)    Dementia (HCC)    Hiatal hernia    Hyperlipidemia    Hypertension    Pre-diabetes    Sleep apnea    sometimes does not use d/t poor fitting of the mask   Ulcer    Past Surgical History:  Past Surgical History:  Procedure Laterality Date   ESOPHAGEAL MANOMETRY N/A 04/27/2016   Procedure: ESOPHAGEAL MANOMETRY (EM) with PH;  Surgeon: Rogelia Copping, MD;  Location: [facility] ENDOSCOPY;  Service: Endoscopy;  Laterality: N/A;   ESOPHAGEAL MANOMETRY N/A 04/19/2017   Procedure: ESOPHAGEAL MANOMETRY (EM);  Surgeon: Dianna Specking, MD;  Location: [facility] ENDOSCOPY;  Service: Endoscopy;  Laterality: N/A;   FACIAL LACERATIONS REPAIR     INSERTION OF MESH N/A 06/02/2017   Procedure: INSERTION OF MESH;  Surgeon: Rubin Calamity, MD;  Location: [facility] ORS;  Service: General;  Laterality: N/A;   TOTAL KNEE ARTHROPLASTY Left 04/30/2024   Procedure: ARTHROPLASTY, KNEE, TOTAL;  Surgeon: Sheril Coy, MD;  Location: [facility] ORS;  Service: Orthopedics;  Laterality: Left;    Assessment & Plan Clinical Impression: Brandon Clark is an 83 year old right-handed Arabic speaking male with history of BPH, dementia maintained  on Aricept  as well as Namenda, hyperlipidemia, hypertension, sleep apnea does not use CPAP, prediabetes, BPV, hiatal hernia status post Nissen fundoplication procedure. Per chart review patient lives with children. Multilevel home and can reside on the main level. Modified independent with a straight point cane prior to admission. Presented 04/30/2024 undergoing left total knee replacement for osteoarthritis per Dr. Daldorf and weightbearing as tolerated and placed on Eliquis for DVT prophylaxis. Postoperatively patient with dyspnea, dizziness and new oxygen requirement. CT angio of the chest showed suspected nonocclusive pulmonary emboli in the segmental branches of the right upper lobe and additional occlusive pulmonary embolism in a segmental branch of lingular artery. Evidence of right heart strain with RV/LV ratio 1.3. 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 Venous Doppler study showed DVT left peroneal vein. Echocardiogram completed did not show any significant heart strain and EF of 60 to 65%. It was initially suggested switch to heparin  drip but because of patient's faith/religion, he would not want to take pork products including heparin  thus his Eliquis was continued. Patient did have some initial pleuritic chest pain hypoxemia has since resolved encouragement of incentive spirometer. Therapy evaluations completed due to patient's decreased functional mobility was admitted for a comprehensive rehab program. Patient transferred to CIR on 05/13/2024 .    Patient currently requires min with basic self-care skills and IADL secondary to muscle weakness, decreased cardiorespiratoy endurance, decreased memory, and decreased standing balance.  Prior to hospitalization, patient could complete bathing, dressing, toileting, driving and working full time with independent .  Patient will benefit  from skilled intervention to decrease level of assist with basic self-care skills and increase independence with basic  self-care skills prior to discharge home with care partner.  Anticipate patient will require intermittent supervision and follow up outpatient.  OT - End of Session Activity Tolerance: Tolerates < 10 min activity, no significant change in vital signs;Tolerates 10 - 20 min activity with multiple rests OT Assessment Rehab Potential (ACUTE ONLY): Fair OT Barriers to Discharge: Inaccessible home environment;Weight bearing restrictions OT Patient demonstrates impairments in the following area(s): Balance;Safety;Cognition;Edema;Endurance;Motor;Pain;Skin Integrity OT Basic ADL's Functional Problem(s): Eating;Grooming;Bathing;Dressing;Toileting OT Advanced ADL's Functional Problem(s): None OT Transfers Functional Problem(s): Tub/Shower;Toilet OT Additional Impairment(s): None OT Plan OT Intensity: Minimum of 1-2 x/day, 45 to 90 minutes OT Frequency: 5 out of 7 days OT Treatment/Interventions: Balance/vestibular training;Discharge planning;Functional electrical stimulation;Pain management;Self Care/advanced ADL retraining;Therapeutic Activities;UE/LE Coordination activities;Cognitive remediation/compensation;Functional mobility training;Patient/family education;Skin care/wound managment;Therapeutic Exercise;DME/adaptive equipment instruction;UE/LE Strength taining/ROM;Wheelchair propulsion/positioning;Disease mangement/prevention;Community reintegration OT Self Feeding Anticipated Outcome(s): Independent OT Basic Self-Care Anticipated Outcome(s): Mod-I OT Toileting Anticipated Outcome(s): Mod-I OT Bathroom Transfers Anticipated Outcome(s): Mod-I OT Recommendation Recommendations for Other Services: Speech consult;Therapeutic Recreation consult Therapeutic Recreation Interventions: Pet therapy Patient destination: Home Follow Up Recommendations: Outpatient OT Equipment Recommended: 3 in 1 bedside comode   OT Evaluation Precautions/Restrictions  Precautions Precautions: Knee;Fall Precaution  Booklet Issued: Yes (comment) Recall of Precautions/Restrictions: Impaired Precaution/Restrictions Comments: no pillow under knee Restrictions Weight Bearing Restrictions Per Provider Order: No LLE Weight Bearing Per Provider Order: Weight bearing as tolerated Pain Pain Assessment Pain Scale: 0-10 Pain Score: 7  Pain Location: Leg Pain Orientation: Left Pain Intervention(s): Medication (See eMAR) Home Living/Prior Functioning Home Living Family/patient expects to be discharged to:: Private residence Living Arrangements: Children Available Help at Discharge: Family, Available PRN/intermittently Type of Home: House Home Access: Stairs to enter Secretary/administrator of Steps: 1 Home Layout: Multi-level Alternate Level Stairs-Number of Steps: 20 stairs Alternate Level Stairs-Rails: Right Bathroom Shower/Tub: Health Visitor: Pharmacist, Community: Yes  Lives With: Son IADL History Homemaking Responsibilities: No Current License: Yes Mode of Transportation: Car (SUV) Occupation: Full time employment Type of Occupation: Gaffer Leisure and Hobbies: Watching news, praying, call family back home Prior Function Level of Independence: Independent with basic ADLs, Independent with homemaking with ambulation, Independent with gait, Independent with transfers  Able to Take Stairs?: Yes Driving: Yes Vocation: Full time employment Leisure: Hobbies-yes (Comment) Vision Baseline Vision/History: 0 No visual deficits Ability to See in Adequate Light: 0 Adequate Patient Visual Report: Blurring of vision Perception  Perception: Within Functional Limits Praxis Praxis: WFL Cognition Cognition Overall Cognitive Status: History of cognitive impairments - at baseline Arousal/Alertness: Awake/alert Orientation Level: Person;Place;Situation Person: Oriented Place: Oriented Situation: Oriented Memory: Impaired Memory Impairment: Retrieval deficit;Decreased  short term memory Decreased Short Term Memory: Verbal complex;Functional complex Safety/Judgment: Appears intact Brief Interview for Mental Status (BIMS) Repetition of Three Words (First Attempt): 3 Temporal Orientation: Year: Correct Temporal Orientation: Month: Missed by more than 1 month Temporal Orientation: Day: Correct Recall: Sock: Yes, no cue required Recall: Blue: Yes, no cue required Recall: Bed: Yes, after cueing (a piece of furniture) BIMS Summary Score: 12 Sensation Sensation Light Touch: Appears Intact Hot/Cold: Not tested Proprioception: Appears Intact Stereognosis: Not tested Coordination Gross Motor Movements are Fluid and Coordinated: Yes Fine Motor Movements are Fluid and Coordinated: Yes Coordination and Movement Description: limited by pain, weakness, and decreased L knee ROM Heel Shin Test: not tested Motor  Motor Motor: Within Functional Limits  Trunk/Postural Assessment  Cervical Assessment Cervical Assessment:  Exceptions to [facility] (mild forward head) Thoracic Assessment Thoracic Assessment: Exceptions to [facility] (thoracic rounding) Lumbar Assessment Lumbar Assessment: Exceptions to [facility] (posterior pelvic tilt) Postural Control Postural Control: Within Functional Limits  Balance Balance Balance Assessed: Yes Static Sitting Balance Static Sitting - Balance Support: Feet supported Static Sitting - Level of Assistance: 5: Stand by assistance Dynamic Sitting Balance Dynamic Sitting - Balance Support: Feet supported Dynamic Sitting - Level of Assistance: 5: Stand by assistance Dynamic Sitting - Balance Activities: Reaching for weighted objects;Reaching across midline;Lateral lean/weight shifting Static Standing Balance Static Standing - Level of Assistance: 4: Min assist Dynamic Standing Balance Dynamic Standing - Level of Assistance: 4: Min assist Dynamic Standing - Balance Activities: Reaching across midline;Reaching for objects;Forward  lean/weight shifting Extremity/Trunk Assessment RUE Assessment RUE Assessment: Within Functional Limits LUE Assessment LUE Assessment: Within Functional Limits  Care Tool Care Tool Self Care Eating   Eating Assist Level: Set up assist    Oral Care    Oral Care Assist Level: Set up assist    Bathing   Body parts bathed by patient: Right arm;Left arm;Chest;Abdomen;Front perineal area;Buttocks;Face;Left upper leg;Right upper leg Body parts bathed by helper: Right lower leg;Left lower leg (Pt unable to reach feet at this time d/t swelling in LLE.)   Assist Level: Minimal Assistance - Patient > 75%    Upper Body Dressing(including orthotics)   What is the patient wearing?: Pull over shirt   Assist Level: Set up assist    Lower Body Dressing (excluding footwear)   What is the patient wearing?: Underwear/pull up;Pants Assist for lower body dressing: Moderate Assistance - Patient 50 - 74%    Putting on/Taking off footwear   What is the patient wearing?: Socks Assist for footwear: Maximal Assistance - Patient 25 - 49%       Care Tool Toileting Toileting activity   Assist for toileting: Minimal Assistance - Patient > 75%     Care Tool Bed Mobility Roll left and right activity   Roll left and right assist level: Contact Guard/Touching assist    Sit to lying activity   Sit to lying assist level: Contact Guard/Touching assist    Lying to sitting on side of bed activity   Lying to sitting on side of bed assist level: the ability to move from lying on the back to sitting on the side of the bed with no back support.: Contact Guard/Touching assist     Care Tool Transfers Sit to stand transfer   Sit to stand assist level: Minimal Assistance - Patient > 75%    Chair/bed transfer   Chair/bed transfer assist level: Minimal Assistance - Patient > 75%     Toilet transfer   Assist Level: Minimal Assistance - Patient > 75%     Care Tool Cognition  Expression of Ideas and Wants  Expression of Ideas and Wants: 3. Some difficulty - exhibits some difficulty with expressing needs and ideas (e.g, some words or finishing thoughts) or speech is not clear  Understanding Verbal and Non-Verbal Content Understanding Verbal and Non-Verbal Content: 3. Usually understands - understands most conversations, but misses some part/intent of message. Requires cues at times to understand   Memory/Recall Ability Memory/Recall Ability : Staff names and faces   Refer to Care Plan for Long Term Goals  SHORT TERM GOAL WEEK 1 1:1 evaluation and treatment session initiated this date. OT roles, goals and purpose discussed with pt as well as therapy schedule. ADL completed this date with levels of assist listed  above. Pt completed functional transfers this session with RW Min A to reach standing, mobility with CGA. During functional mobility, pt experiencing dizziness and fatigue. No distress noted in vitals. Pt would benefit from skilled OT in IPR setting in order to maximize independence with ADLs upon D/C.    Recommendations for other services: Therapeutic Recreation  Pet therapy   Skilled Therapeutic Intervention ADL ADL Eating: Set up Where Assessed-Eating: Edge of bed Grooming: Minimal assistance Where Assessed-Grooming: Edge of bed Upper Body Bathing: Minimal assistance Where Assessed-Upper Body Bathing: Edge of bed Lower Body Bathing: Moderate assistance Where Assessed-Lower Body Bathing: Edge of bed Upper Body Dressing: Setup Where Assessed-Upper Body Dressing: Edge of bed Lower Body Dressing: Moderate assistance Where Assessed-Lower Body Dressing: Edge of bed Toileting: Minimal assistance Where Assessed-Toileting: Teacher, Adult Education: Curator Method: Proofreader: It Sales Professional: Information systems manager with back Film/video Editor: Insurance Underwriter Method: Musician: Shower seat with back Mobility  Bed Mobility Bed Mobility: Sit to Supine;Supine to Sit;Left Sidelying to Sit Left Sidelying to Sit: Contact Guard/Touching assist Supine to Sit: Contact Guard/Touching assist Sit to Supine: Minimal Assistance - Patient > 75% Transfers Sit to Stand: Minimal Assistance - Patient > 75%   Discharge Criteria: Patient will be discharged from OT if patient refuses treatment 3 consecutive times without medical reason, if treatment goals not met, if there is a change in medical status, if patient makes no progress towards goals or if patient is discharged from hospital.  The above assessment, treatment plan, treatment alternatives and goals were discussed and mutually agreed upon: by patient  Belina Mandile Woods-Chance, MS, OTR/L 05/14/2024, 12:32 PM

## 2024-05-14 NOTE — Progress Notes (Signed)
 PROGRESS NOTE   Subjective/Complaints: C/o extreme pain and tightness in left calf where he currently has a clot, scheduled his percocet and replaced tylenol  prn with tramadol  prn  ROS: ice makes his pain worse   Objective:   No results found. Recent Labs    05/13/24 0455 05/14/24 0445  WBC 7.6 7.8  HGB 11.2* 11.2*  HCT 35.1* 33.7*  PLT 427* 467*   Recent Labs    05/13/24 0455 05/14/24 0445  NA 136 136  K 4.5 4.4  CL 99 97*  CO2 30 28  GLUCOSE 133* 135*  BUN 19 16  CREATININE 0.90 0.95  CALCIUM 9.4 8.7*    Intake/Output Summary (Last 24 hours) at 05/14/2024 1144 Last data filed at 05/13/2024 1800 Gross per 24 hour  Intake 240 ml  Output --  Net 240 ml        Physical Exam: Vital Signs Blood pressure 116/79, pulse 80, temperature 97.6 F (36.4 C), temperature source Oral, resp. rate 18, height 6' (1.829 m), weight 115.6 kg, SpO2 95%. Gen: no distress, normal appearing HEENT: oral mucosa pink and moist, NCAT Cardio: Reg rate Chest: normal effort, normal rate of breathing Abd: soft, non-distended Ext: no edema Psych: pleasant, normal affect Skin: intact Neurologic: Cranial nerves II through XII intact, motor strength is 5/5 in bilateral deltoid, bicep, tricep, grip, right hip flexor, knee extensors, ankle dorsiflexor and plantar flexor 3- Left Hip flex, knee ext 4/5  L ankle PF/DF   Musculoskeletal: Full range of motion in all 4 extremities. No joint swelling except at left knee, there is diffuse swelling in the left calf with ecchymosis.  Left knee incision is covered by postoperative dressing   Assessment/Plan: 1. Functional deficits which require 3+ hours per day of interdisciplinary therapy in a comprehensive inpatient rehab setting. Physiatrist is providing close team supervision and 24 hour management of active medical problems listed below. Physiatrist and rehab team continue to assess  barriers to discharge/monitor patient progress toward functional and medical goals  Care Tool:  Bathing              Bathing assist       Upper Body Dressing/Undressing Upper body dressing        Upper body assist      Lower Body Dressing/Undressing Lower body dressing            Lower body assist       Toileting Toileting    Toileting assist       Transfers Chair/bed transfer  Transfers assist           Locomotion Ambulation   Ambulation assist              Walk 10 feet activity   Assist           Walk 50 feet activity   Assist           Walk 150 feet activity   Assist           Walk 10 feet on uneven surface  activity   Assist           Wheelchair     Assist  Wheelchair 50 feet with 2 turns activity    Assist            Wheelchair 150 feet activity     Assist          Blood pressure 116/79, pulse 80, temperature 97.6 F (36.4 C), temperature source Oral, resp. rate 18, height 6' (1.829 m), weight 115.6 kg, SpO2 95%.  Medical Problem List and Plan: 1. 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  Weightbearing as tolerated             -patient may  shower with Left knee wrapped              -ELOS/Goals: 14-16d Sup/min A  Initial CIR evals today  2.  Left peroneal DVT: continue Eliquis             -antiplatelet therapy: N/A  3. Pain Management:  Scheduled percocet TID  4. Mood/Behavior/Sleep/dementia: Elavil 25 mg nightly, decrease namenda to HS since can cause leg swelling which he currently has, melatonin 5 mg nightly, Aricept  10 mg nightly trazodone  50 mg nightly as needed             -antipsychotic agents: N/A  5. Neuropsych/cognition: This patient is capable of making decisions on his own behalf. 6. Skin/Wound Care: Routine skin checks 7. Fluids/Electrolytes/Nutrition: Routine ins and outs  with follow-up chemistries 8.  Hypertension.  HCTZ 12.5 mg daily.  Monitor with increased mobility 9.  BPH.  Proscar  5 mg daily 10.  Hyperlipidemia.  Zocor  11.  Prediabetes.  Hemoglobin A1c 5.9.  SSI 12.  OSA.  Patient does not use CPAP.  Check oxygen saturations every shift 13.  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  Dietary follow-up 14.  Constipation.  MiraLAX twice daily, Colace 100 mg twice daily  LOS: 1 days A FACE TO FACE EVALUATION WAS PERFORMED  Deavin Forst P Omaira Mellen 05/14/2024, 11:44 AM

## 2024-05-15 DIAGNOSIS — I2699 Other pulmonary embolism without acute cor pulmonale: Secondary | ICD-10-CM | POA: Diagnosis not present

## 2024-05-15 LAB — GLUCOSE, CAPILLARY
Glucose-Capillary: 132 mg/dL — ABNORMAL HIGH (ref 70–99)
Glucose-Capillary: 128 mg/dL — ABNORMAL HIGH (ref 70–99)
Glucose-Capillary: 179 mg/dL — ABNORMAL HIGH (ref 70–99)
Glucose-Capillary: 141 mg/dL — ABNORMAL HIGH (ref 70–99)

## 2024-05-15 MED ORDER — POLYETHYLENE GLYCOL 3350 17 G PO PACK
17.0000 g | PACK | Freq: Every day | ORAL | Status: DC
  Administered 2024-05-16: 17 g via ORAL
  Filled 2024-05-15: qty 1

## 2024-05-15 NOTE — Progress Notes (Signed)
 Physical Therapy Note  Patient Details  Name: Brandon Clark MRN: [id number] Date of Birth: 03/02/1941 Today's Date: 05/15/2024   Physical Therapist participated in the interdisciplinary team conference, providing clinical information regarding the patient's current status, treatment goals, and weekly focus, including any barriers that need to be addressed. Please see the Inpatient Rehabilitation Team Conference and Plan of Care Update for further details.   Therisa HERO Zaunegger Therisa Stains PT, DPT 05/15/2024, 8:32 AM

## 2024-05-15 NOTE — Plan of Care (Signed)
  Problem: Consults Goal: RH GENERAL PATIENT EDUCATION Description: See Patient Education module for education specifics. 05/15/2024 9287 by Gilberto Tinnie KIDD, RN Outcome: Progressing 05/15/2024 0705 by Gilberto Tinnie KIDD, RN Outcome: Progressing   Problem: RH BOWEL ELIMINATION Goal: RH STG MANAGE BOWEL WITH ASSISTANCE Description: STG Manage Bowel with mod I  Assistance. 05/15/2024 9287 by Gilberto Tinnie KIDD, RN Outcome: Progressing 05/15/2024 0705 by Gilberto Tinnie KIDD, RN Outcome: Progressing Goal: RH STG MANAGE BOWEL W/MEDICATION W/ASSISTANCE Description: STG Manage Bowel with Medication with mod I Assistance. 05/15/2024 0712 by Gilberto Tinnie KIDD, RN Outcome: Progressing 05/15/2024 0705 by Gilberto Tinnie KIDD, RN Outcome: Progressing   Problem: RH PAIN MANAGEMENT Goal: RH STG PAIN MANAGED AT OR BELOW PT'S PAIN GOAL Description: < 4 with prns 05/15/2024 9287 by Gilberto Tinnie KIDD, RN Outcome: Progressing 05/15/2024 0705 by Gilberto Tinnie KIDD, RN Outcome: Progressing   Problem: RH KNOWLEDGE DEFICIT GENERAL Goal: RH STG INCREASE KNOWLEDGE OF SELF CARE AFTER HOSPITALIZATION Description: Patient and family will be able to manage care at discharge using educational resources for medications, skin care, dietary modifications independently 05/15/2024 0712 by Gilberto Tinnie KIDD, RN Outcome: Progressing 05/15/2024 0705 by Gilberto Tinnie KIDD, RN Outcome: Progressing

## 2024-05-15 NOTE — Progress Notes (Signed)
 Occupational Therapy Note  Patient Details  Name: KAYE MITRO MRN: [id number] Date of Birth: 05/17/1941  Occupational Therapist participated in the interdisciplinary team conference, providing clinical information regarding the patient's current status, treatment goals, and weekly focus, including any barriers that need to be addressed. Please see the Inpatient Rehabilitation Team Conference and Plan of Care Update for further details.    Vang Kraeger Woods-Chance, MS, OTR/L 05/15/2024, 5:40 PM

## 2024-05-15 NOTE — Progress Notes (Signed)
 Occupational Therapy Session Note  Patient Details  Name: Brandon Clark MRN: [id number] Date of Birth: 12-Apr-1941  Today's Date: 05/15/2024 OT Individual Time: 9098-8981 OT Individual Time Calculation (min): 77 min    Short Term Goals: Week 1:  OT Short Term Goal 1 (Week 1): STG=LTG d/t ELOS  Skilled Therapeutic Interventions/Progress Updates:  Skilled OT session completed to address ADL retraining. Pt received seated EOB, agreeable to participate in therapy. Pt reports 7/10 pain in LLE, pain medication received upon arrival. All transfers this session completed ambulatory with RW CGA d/t poor safety awareness with RW. OT provides increased VC on RW mgmt throughout session.   EOB>toilet CGA for RW safety, pt requesting privacy during toileting; however, OT verbalized need for supervision d/t limited RW safety. Pt and OT collaborate to allow privacy when seated at toilet and performing toileting hygiene, during transfers supervision is required. Pt displaying difficulty weight bearing on LLE, OT provides WC to perform hand hygiene. Pt completed sink level ADLs seated in [facility] with supervision for sequencing. Pt declined LB dressing/bathing d/t privacy. Pt donned/doffed UB clothing with set-up A. Pt dependently propelled to ADL apt. OT demo'd shower seating systems to assist with energy conservation and safety during bathing. Pt declined return demo d/t leg pain and growing increasingly frustrated with education on safety with TTB in home environment. Pt requests shower chair with no back for home environment for comfort. OT attempted to redirect, pt continues to decline. Pt returned to room and requests to toilet, WC>Toilet with CGA. Pt completes toileting Mod-I. Toilet>WC CGA, WC>EOB CGA. Pt grows increasingly tearful about CLOF, OT provides therapeutic use of self for psychosocial support and encouragement. Pt continues to be tearful and requests to lie down, EOB>Supine with Min A to lift  LLE. To reduce swelling on Lle OT provided ice pack. Pt supine in bed with bed alarm on and all needs within reach.  Therapy Documentation Precautions:  Precautions Precautions: Knee, Fall Precaution Booklet Issued: Yes (comment) Recall of Precautions/Restrictions: Impaired Precaution/Restrictions Comments: no pillow under knee Restrictions Weight Bearing Restrictions Per Provider Order: No LLE Weight Bearing Per Provider Order: Weight bearing as tolerated  Therapy/Group: Individual Therapy  Merdith Boyd Woods-Chance, MS, OTR/L 05/15/2024, 10:04 AM

## 2024-05-15 NOTE — Progress Notes (Signed)
 Patient ID: Brandon Clark, male   DOB: Jul 07, 1940, 83 y.o.   MRN: [id number]  Have reviewed team conference with pt and family. Both aware and agreeable with targeted d/c date of 11/21 and goals of Independent with assistive device.  Recommended for OP PT/OT and is workers comp. Will relay all information to workers comp [employer]

## 2024-05-15 NOTE — Progress Notes (Signed)
 PROGRESS NOTE   Subjective/Complaints: Has some bleeding below left knee incision, received clearance from ortho to remove dressing, asked nursing to please do and inspect  ROS: ice makes his pain worse- improved with percocet and tramadol    Objective:   No results found. Recent Labs    05/13/24 0455 05/14/24 0445  WBC 7.6 7.8  HGB 11.2* 11.2*  HCT 35.1* 33.7*  PLT 427* 467*   Recent Labs    05/13/24 0455 05/14/24 0445  NA 136 136  K 4.5 4.4  CL 99 97*  CO2 30 28  GLUCOSE 133* 135*  BUN 19 16  CREATININE 0.90 0.95  CALCIUM 9.4 8.7*    Intake/Output Summary (Last 24 hours) at 05/15/2024 1124 Last data filed at 05/14/2024 1507 Gross per 24 hour  Intake 50 ml  Output --  Net 50 ml        Physical Exam: Vital Signs Blood pressure 122/74, pulse 95, temperature 98 F (36.7 C), resp. rate 18, height 6' (1.829 m), weight 115.6 kg, SpO2 99%. Gen: no distress, normal appearing HEENT: oral mucosa pink and moist, NCAT Cardio: Reg rate Chest: normal effort, normal rate of breathing Abd: soft, non-distended Ext: no edema Psych: pleasant, normal affect Skin: intact Neurologic: Cranial nerves II through XII intact, motor strength is 5/5 in bilateral deltoid, bicep, tricep, grip, right hip flexor, knee extensors, ankle dorsiflexor and plantar flexor 4- Left Hip flex, knee ext 4/5  L ankle PF/DF, improved 11/12   Musculoskeletal: Full range of motion in all 4 extremities. No joint swelling except at left knee, there is diffuse swelling in the left calf with ecchymosis.  Left knee incision is covered by postoperative dressing   Assessment/Plan: 1. Functional deficits which require 3+ hours per day of interdisciplinary therapy in a comprehensive inpatient rehab setting. Physiatrist is providing close team supervision and 24 hour management of active medical problems listed below. Physiatrist and rehab team continue  to assess barriers to discharge/monitor patient progress toward functional and medical goals  Care Tool:  Bathing    Body parts bathed by patient: Right arm, Left arm, Chest, Abdomen, Front perineal area, Buttocks, Face, Left upper leg, Right upper leg   Body parts bathed by helper: Right lower leg, Left lower leg (Pt unable to reach feet at this time d/t swelling in LLE.)     Bathing assist Assist Level: Minimal Assistance - Patient > 75%     Upper Body Dressing/Undressing Upper body dressing   What is the patient wearing?: Pull over shirt    Upper body assist Assist Level: Set up assist    Lower Body Dressing/Undressing Lower body dressing      What is the patient wearing?: Underwear/pull up, Pants     Lower body assist Assist for lower body dressing: Moderate Assistance - Patient 50 - 74%     Toileting Toileting    Toileting assist Assist for toileting: Minimal Assistance - Patient > 75%     Transfers Chair/bed transfer  Transfers assist     Chair/bed transfer assist level: Minimal Assistance - Patient > 75%     Locomotion Ambulation   Ambulation assist  Assist level: Minimal Assistance - Patient > 75% Assistive device: Walker-rolling Max distance: 43ft   Walk 10 feet activity   Assist     Assist level: Minimal Assistance - Patient > 75% Assistive device: Walker-rolling   Walk 50 feet activity   Assist Walk 50 feet with 2 turns activity did not occur: Safety/medical concerns (pain, dizziness)         Walk 150 feet activity   Assist Walk 150 feet activity did not occur: Safety/medical concerns (pain, dizziness)         Walk 10 feet on uneven surface  activity   Assist Walk 10 feet on uneven surfaces activity did not occur: Safety/medical concerns (pain, dizziness)         Wheelchair     Assist Is the patient using a wheelchair?: Yes Type of Wheelchair: Manual    Wheelchair assist level: Dependent - Patient  0% Max wheelchair distance: 57ft    Wheelchair 50 feet with 2 turns activity    Assist        Assist Level: Dependent - Patient 0%   Wheelchair 150 feet activity     Assist  Wheelchair 150 feet activity did not occur: Safety/medical concerns       Blood pressure 122/74, pulse 95, temperature 98 F (36.7 C), resp. rate 18, height 6' (1.829 m), weight 115.6 kg, SpO2 99%.  Medical Problem List and Plan: 1. 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  Weightbearing as tolerated             -patient may  shower with Left knee wrapped              -ELOS/Goals: 14-16d Sup/min A  Asked nursing to please remove left knee dressing and examine incision site  2.  Left peroneal DVT: continue Eliquis             -antiplatelet therapy: N/A  3. Pain Management:  Scheduled percocet TID, added tramadol  prn  4. Mood/Behavior/Sleep/dementia: Elavil 25 mg nightly, decrease namenda to HS since can cause leg swelling which he currently has, melatonin 5 mg nightly, Aricept  10 mg nightly trazodone  50 mg nightly as needed             -antipsychotic agents: N/A  5. Neuropsych/cognition: This patient is capable of making decisions on his own behalf. 6. Skin/Wound Care: Routine skin checks 7. Fluids/Electrolytes/Nutrition: Routine ins and outs with follow-up chemistries 8.  Hypertension.  HCTZ 12.5 mg daily.  Monitor with increased mobility 9.  BPH.  Proscar  5 mg daily 10.  Hyperlipidemia.  Zocor  11.  Prediabetes.  Hemoglobin A1c 5.9.  SSI 12.  OSA.  Patient does not use CPAP.  Check oxygen saturations every shift 13.  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  Dietary follow-up  14.  Constipation: decrease miralax to daily. Colace 100 mg twice daily, LBM 11/11  LOS: 2 days A FACE TO FACE EVALUATION WAS PERFORMED  Temeka Pore P Ileene Allie 05/15/2024, 11:24 AM

## 2024-05-15 NOTE — Progress Notes (Signed)
 Inpatient Rehabilitation Center Individual Statement of Services  Patient Name:  Brandon Clark  Date:  05/15/2024  Welcome to the Inpatient Rehabilitation Center.  Our goal is to provide you with an individualized program based on your diagnosis and situation, designed to meet your specific needs.  With this comprehensive rehabilitation program, you will be expected to participate in at least 3 hours of rehabilitation therapies Monday-Friday, with modified therapy programming on the weekends.  Your rehabilitation program will include the following services:  Physical Therapy (PT), Occupational Therapy (OT), Speech Therapy (ST), 24 hour per day rehabilitation nursing, Therapeutic Recreaction (TR), Neuropsychology, Care Coordinator, Rehabilitation Medicine, Nutrition Services, and Pharmacy Services  Weekly team conferences will be held on Wednesday to discuss your progress.  Your Inpatient Rehabilitation Care Coordinator will talk with you frequently to get your input and to update you on team discussions.  Team conferences with you and your family in attendance may also be held.  Expected length of stay: 14-16 days   Overall anticipated outcome: Independent with assistive device   Depending on your progress and recovery, your program may change. Your Inpatient Rehabilitation Care Coordinator will coordinate services and will keep you informed of any changes. Your Inpatient Rehabilitation Care Coordinator's name and contact numbers are listed  below.  The following services may also be recommended but are not provided by the Inpatient Rehabilitation Center:  Driving Evaluations Home Health Rehabiltiation Services Outpatient Rehabilitation Services Vocational Rehabilitation   Arrangements will be made to provide these services after discharge if needed.  Arrangements include referral to agencies that provide these services.  Your insurance has been verified to be: GENERIC WORKER'S  COMP / [employer]  Your primary doctor is:  Gladystine Erminio CROME, MD  Pertinent information will be shared with your doctor and your insurance company.  Inpatient Rehabilitation Care Coordinator:  Di'Asia Loreli SIERRAS [phone] or ELIGAH BRINKS  Information discussed with and copy given to patient by: Waverly Loreli, 05/15/2024, 8:58 AM

## 2024-05-15 NOTE — Progress Notes (Signed)
 This RN inquired if patient would like to have prayer time due to his religion. This was asked while an interpretor was available and in the room. Patient did not require prayer time due to being sick and in the hospital. Patient was very appreciative of this inquiry.

## 2024-05-15 NOTE — Progress Notes (Signed)
 Physical Therapy Session Note  Patient Details  Name: Brandon Clark MRN: [id number] Date of Birth: [birth year]  Today's Date: 05/15/2024 PT Individual Time: 8969-8875 and 1345-1456 PT Individual Time Calculation (min): 54 min and 71 min  Short Term Goals: Week 1:  PT Short Term Goal 1 (Week 1): STG=LTG due to LOS  Skilled Therapeutic Interventions/Progress Updates:   Treatment Session 1 Received pt semi-reclined in bed with interpreter at bedside. Pt reporting feeling dizzy and c/o pain in L knee. Session with emphasis on functional mobility/transfers, generalized strengthening and endurance, dynamic standing balance/coordination, and ambulation. BP in supine: 103/71. Performed active ankle pumps to promote blood flow (2x10) and reassessed BP: 109/74. Pt transferred semi-reclined<>sitting L EOB with HOB elevated and use of bedrails with CGA and cues to avoid Valsalva. BP sitting EOB: 103/66 (symptomatic) and continued with active ankle pumps 2x10. Pt performed all transfers form elevated surfaces with RW and min A throughout session. Attempted to stand from low sitting EOB but pt unable. BP in standing: 92/70 on trial 1 and 96/75 on trial 2 - pt symptomatic. Provided education on importance of remaining upright OOB to regulate BP after prolonged time in bed. In standing, performed alternating marches then ambulated 36ft x 1, 57ft x 1, and 50ft x 1 with RW and min A with close WC follow - pt limited by dizziness but BP: 127/90. Performed WC mobility 98ft using BUE and supervision back to room and concluded session with pt sitting in WC, needs within reach, and seatbelt alarm on.   Treatment Session 2 Received pt semi-reclined in bed with interpreter at bedside. Pt agreeable to PT treatment and reported pain 6/10 in L knee - RN notified and present to adminster pain medication. Session with emphasis on functional mobility/transfers, toileting, generalized strengthening and endurance,  dynamic standing balance/coordination, and ambulation. Pt transferred semi-reclined<>sitting L EOB with HOB elevated and CGA/min A for LLE management. Pt reported urge to toilet and performed all transfers with RW and min A throughout session. Pt ambulated in/out of bathroom with RW and CGA and pt able to manage clothing without assist. Pt continent of bowel and bladder and performed hygiene management without assist. Sat in WC at sink and washed hands with setup assist.   Pt transported to/from room in [facility] dependently for time management purposes. Pt performed seated BLE strengthening on Kinetron at 20 cm/sec for 1 minute x 3 trials with emphasis on glute/quad strength and promoting L knee flexion ROM. Pt reports his primary goal is to be able to get onto his knees to pray - discussed general recovery timeline and healing process. Transitioned to the following seated exercises with emphasis on LE strength/ROM: -marches 2x10 bilaterally -active assisted heel slides on LLE 2x10 CSW arrived briefly to confirm home setup - pt confirmed that he lives with his son and son's family and stays on 2nd floor with 10+10 STE and has 1 STE home. Pt reported he is unable to stay on main level and when questioned about rearranging home, pt became frustrated and overwhelmed reporting that it is up to his son - CSW plans to reach out to family. Encouraged ambulating but pt declined due to pain and dizziness. Returned to room and concluded session with pt sitting in WC, needs within reach, and seatbelt alarm on.  Therapy Documentation Precautions:  Precautions Precautions: Knee, Fall Precaution Booklet Issued: Yes (comment) Recall of Precautions/Restrictions: Impaired Precaution/Restrictions Comments: no pillow under knee Restrictions Weight Bearing Restrictions Per Provider Order: No LLE  Weight Bearing Per Provider Order: Weight bearing as tolerated  Therapy/Group: Individual Therapy Therisa HERO Zaunegger Therisa Stains PT, DPT 05/15/2024, 6:55 AM

## 2024-05-15 NOTE — Progress Notes (Signed)
 Inpatient Rehabilitation Care Coordinator Assessment and Plan Patient Details  Name: AXIEL FJELD MRN: [id number] Date of Birth: [birth year]  Today's Date: 05/15/2024  Hospital Problems: Principal Problem:   Pulmonary emboli [facility])  Past Medical History:  Past Medical History:  Diagnosis Date   Acid reflux    Arthritis    BPH (benign prostatic hyperplasia)    BPPV (benign paroxysmal positional vertigo)    Dementia (HCC)    Hiatal hernia    Hyperlipidemia    Hypertension    Pre-diabetes    Sleep apnea    sometimes does not use d/t poor fitting of the mask   Ulcer    Past Surgical History:  Past Surgical History:  Procedure Laterality Date   ESOPHAGEAL MANOMETRY N/A 04/27/2016   Procedure: ESOPHAGEAL MANOMETRY (EM) with PH;  Surgeon: Rogelia Copping, MD;  Location: [facility] ENDOSCOPY;  Service: Endoscopy;  Laterality: N/A;   ESOPHAGEAL MANOMETRY N/A 04/19/2017   Procedure: ESOPHAGEAL MANOMETRY (EM);  Surgeon: Dianna Specking, MD;  Location: [facility] ENDOSCOPY;  Service: Endoscopy;  Laterality: N/A;   FACIAL LACERATIONS REPAIR     INSERTION OF MESH N/A 06/02/2017   Procedure: INSERTION OF MESH;  Surgeon: Rubin Calamity, MD;  Location: [facility] ORS;  Service: General;  Laterality: N/A;   TOTAL KNEE ARTHROPLASTY Left 04/30/2024   Procedure: ARTHROPLASTY, KNEE, TOTAL;  Surgeon: Sheril Coy, MD;  Location: [facility] ORS;  Service: Orthopedics;  Laterality: Left;   Social History:  reports that he has never smoked. He has never used smokeless tobacco. He reports that he does not drink alcohol and does not use drugs.  Family / Support Systems Marital Status: Married Patient Roles: Spouse, Parent Children: Son - [name] Hunnewell and Daughter - Elder Cocker Other Supports: Brewing Technologist Anticipated Caregiver: None reported Ability/Limitations of Caregiver: No caregiver lsited at present Caregiver Availability: 24/7  Social History Preferred language: Arabic,Egyptian Religion:  Muslim Cultural Background: Arabic/Egyptian Health Literacy - How often do you need to have someone help you when you read instructions, pamphlets, or other written material from your doctor or pharmacy?: Often Writes: Yes Employment Status: Employed Return to Work Plans: Plans to return to work   Abuse/Neglect Abuse/Neglect Assessment Can Be Completed: Yes Physical Abuse: Denies Verbal Abuse: Denies Sexual Abuse: Denies Exploitation of patient/patient's resources: Denies Self-Neglect: Denies  Patient response to: Social Isolation - How often do you feel lonely or isolated from those around you?: Never  Emotional Status Pt's affect, behavior and adjustment status: Patient adjusting to therapy Recent Psychosocial Issues: None Psychiatric History: None Substance Abuse History: None  Patient / Family Perceptions, Expectations & Goals Pt/Family understanding of illness & functional limitations: Patient understanding of illness & functional Premorbid pt/family roles/activities: Active in community Anticipated changes in roles/activities/participation: Will limit activity in community until better Pt/family expectations/goals: Naval Architect Agencies: None Premorbid Home Care/DME Agencies: None Transportation available at discharge: Yes Is the patient able to respond to transportation needs?: Yes In the past 12 months, has lack of transportation kept you from medical appointments or from getting medications?: No In the past 12 months, has lack of transportation kept you from meetings, work, or from getting things needed for daily living?: No  Discharge Planning Living Arrangements: Children, Other relatives Support Systems: Children, Other relatives Type of Residence: Private residence Insurance Resources: Media Planner (specify), Artist (specify name) (GENERIC WORKER'S COMP / [place] COMP - [employer] Maus  [phone]) Financial Resources: Employment Financial Screen Referred: Yes Living Expenses: Lives with family  Money Management: Patient, Family Does the patient have any problems obtaining your medications?: No Home Management: Lives with family Patient/Family Preliminary Plans: Plans to return back home with family Care Coordinator Anticipated Follow Up Needs: Other (comment) (Workers Comp) Expected length of stay: 14-16 days  Clinical Impression CSW met with patient/family to introduce herself and complete initial assessment. Patient AxOx4 and is able to make all needs known. The Arabic interpreter is present. Mr. Wittwer presented to [facility] as a workers comp case following a fall. He reports living with his family and his granddaughter Margery (who is in college) is the best person to contact about his care as she speaks English. He plans to return home upon discharge. All needs will be reported to his workers comp sports coach, [employer].  There were no further needs or concerns at present. CSW will follow up with family and continue to follow.   Will provide patient/family with an update as soon as one becomes available.   Di'Asia  Loreli 05/15/2024, 8:54 AM

## 2024-05-15 NOTE — Plan of Care (Signed)
  Problem: Consults Goal: RH GENERAL PATIENT EDUCATION Description: See Patient Education module for education specifics. Outcome: Progressing   Problem: RH BOWEL ELIMINATION Goal: RH STG MANAGE BOWEL WITH ASSISTANCE Description: STG Manage Bowel with mod I  Assistance. Outcome: Progressing Goal: RH STG MANAGE BOWEL W/MEDICATION W/ASSISTANCE Description: STG Manage Bowel with Medication with mod I Assistance. Outcome: Progressing   Problem: RH PAIN MANAGEMENT Goal: RH STG PAIN MANAGED AT OR BELOW PT'S PAIN GOAL Description: < 4 with prns Outcome: Progressing   Problem: RH KNOWLEDGE DEFICIT GENERAL Goal: RH STG INCREASE KNOWLEDGE OF SELF CARE AFTER HOSPITALIZATION Description: Patient and family will be able to manage care at discharge using educational resources for medications, skin care, dietary modifications independently Outcome: Progressing

## 2024-05-15 NOTE — Patient Care Conference (Signed)
 Inpatient RehabilitationTeam Conference and Plan of Care Update Date: 05/15/2024   Time: 11:34 AM    Patient Name: Brandon Clark      Medical Record Number: [id number]  Date of Birth: Jun 01, 1941 Sex: Male         Room/Bed: 4M12C/4M12C-01 Payor Info: Payor: GENERIC WORKER'S COMP / Plan: [employer] / Product Type: *No Product type* /    Admit Date/Time:  05/13/2024  3:34 PM  Primary Diagnosis:  Pulmonary emboli [facility])  Hospital Problems: Principal Problem:   Pulmonary emboli [facility])    Expected Discharge Date: Expected Discharge Date: 05/24/24  Team Members Present: Physician leading conference: Dr. Sven Elks Social Worker Present: Waverly Gentry, LCSW-A Nurse Present: Barnie Ronde, RN PT Present: Therisa Stains, PT OT Present: Vera Pop, OT PPS Coordinator present : Eleanor Colon, SLP     Current Status/Progress Goal Weekly Team Focus  Bowel/Bladder   Continent of bowel and bladder   Administer laxative to keep BM regular. Last BM 05/15/2024   Monitor for constipation and assist with ambulation for patient to get in to bathroom.    Swallow/Nutrition/ Hydration               ADL's   UB set-up, LB Mod A, toileting CGA/supervision   Mod I   barries: pain, weakness/deconditioning, dizziness, cognition, poor frustration tolerance    Mobility   bed mobility supervision, transfers with RW min A, gait 2ft with RW min A   Mod I/supervision  barriers: pain, weakness/deconditioning, SOB, decresaed L knee ROM    Communication                Safety/Cognition/ Behavioral Observations               Pain   Patient pain score has been high each time he is asked. 9 or 10 is his answer on the scale.   Work on a pain regimine to decrease his pain levels.   Mobility and pain medication to improve leg healing and pain.    Skin   Surgical wound on left extremety   There is some bleeding at the surgical site, moitor output and wound  healing.  Improve circulation and wound healing in Left leg.      Discharge Planning:  Plans to discharge home with his son - lives on the second level. Is workers comp - all follow up needs will be provided to them after recommendations solidified.   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 Noted dizziness without ortho, and bleeding through surgical dressing on knee.  Patient on target to meet rehab goals: yes, currently needs set up for upper body care and mod assist for lower body care. Needs min assist for transfers, CGA for toileting and able to ambulate up to 23'. Goals for discharge set for mod I overall and supervision for steps.  *See Care Plan and progress notes for long and short-term goals.   Revisions to Treatment Plan:  Buspar added for anxiety   Teaching Needs: Safety, transfers, toileting, medications, etc.   Current Barriers to Discharge: Decreased caregiver support and Home enviroment access/layout  Possible Resolutions to Barriers: Family education OP follow up services     Medical Summary Current Status: class 1 obesity, bleeding from incision site, pain from left calf DVT, PE, insomnia, dementia  Barriers to Discharge: Medical stability;Complicated Wound  Barriers  to Discharge Comments: class 1 obesity, bleeding from incision site, pain from left calf DVT, PE, insomnia, dementia Possible Resolutions to [employer]: provide dietary education, remove dressing today and examine incision, continue Eliquis, scheduled percocet 10mg  TID, added prn tramadol , continue melatonin HS, continue namenda   Continued Need for Acute Rehabilitation Level of Care: The patient requires daily medical management by a physician with specialized training in physical medicine and rehabilitation for the following reasons: Direction of a multidisciplinary  physical rehabilitation program to maximize functional independence : Yes Medical management of patient stability for increased activity during participation in an intensive rehabilitation regime.: Yes Analysis of laboratory values and/or radiology reports with any subsequent need for medication adjustment and/or medical intervention. : Yes   I attest that I was present, lead the team conference, and concur with the assessment and plan of the team.   Fredericka Sober B 05/15/2024, 4:06 PM

## 2024-05-16 DIAGNOSIS — I2699 Other pulmonary embolism without acute cor pulmonale: Secondary | ICD-10-CM | POA: Diagnosis not present

## 2024-05-16 LAB — GLUCOSE, CAPILLARY
Glucose-Capillary: 127 mg/dL — ABNORMAL HIGH (ref 70–99)
Glucose-Capillary: 98 mg/dL (ref 70–99)
Glucose-Capillary: 176 mg/dL — ABNORMAL HIGH (ref 70–99)
Glucose-Capillary: 123 mg/dL — ABNORMAL HIGH (ref 70–99)

## 2024-05-16 LAB — MAGNESIUM: Magnesium: 2 mg/dL (ref 1.7–2.4)

## 2024-05-16 MED ORDER — TAMSULOSIN HCL 0.4 MG PO CAPS
0.4000 mg | ORAL_CAPSULE | Freq: Every day | ORAL | Status: DC
  Administered 2024-05-16 – 2024-05-19 (×4): 0.4 mg via ORAL
  Filled 2024-05-16 (×4): qty 1

## 2024-05-16 MED ORDER — TAPENTADOL HCL ER 50 MG PO TB12
50.0000 mg | ORAL_TABLET | Freq: Two times a day (BID) | ORAL | Status: DC
Start: 2024-05-16 — End: 2024-05-16

## 2024-05-16 MED ORDER — OXYCODONE-ACETAMINOPHEN 5-325 MG PO TABS
1.0000 | ORAL_TABLET | Freq: Three times a day (TID) | ORAL | Status: DC | PRN
  Administered 2024-05-16 (×2): 1 via ORAL
  Administered 2024-05-17: 2 via ORAL
  Filled 2024-05-16 (×2): qty 1
  Filled 2024-05-16: qty 2

## 2024-05-16 MED ORDER — OXYCODONE HCL ER 10 MG PO T12A
20.0000 mg | EXTENDED_RELEASE_TABLET | Freq: Two times a day (BID) | ORAL | Status: DC
Start: 2024-05-16 — End: 2024-05-23
  Administered 2024-05-16 – 2024-05-22 (×14): 20 mg via ORAL
  Filled 2024-05-16 (×14): qty 2

## 2024-05-16 MED ORDER — OXYCODONE-ACETAMINOPHEN 5-325 MG PO TABS: 1.0000 | ORAL_TABLET | Freq: Three times a day (TID) | ORAL | Status: DC

## 2024-05-16 MED ORDER — MEMANTINE HCL 10 MG PO TABS: 10.0000 mg | ORAL_TABLET | Freq: Every day | ORAL | Status: DC

## 2024-05-16 MED ORDER — MAGNESIUM GLUCONATE 500 (27 MG) MG PO TABS
250.0000 mg | ORAL_TABLET | Freq: Every day | ORAL | Status: DC
  Administered 2024-05-16 – 2024-05-20 (×5): 250 mg via ORAL
  Filled 2024-05-16 (×5): qty 1

## 2024-05-16 NOTE — Progress Notes (Signed)
 Physical Therapy Session Note  Patient Details  Name: Brandon Clark MRN: [id number] Date of Birth: 1941-03-19  Today's Date: 05/16/2024 PT Individual Time: 8953-8844 and 1400-1456 PT Individual Time Calculation (min): 69 min and 56 min  Short Term Goals: Week 1:  PT Short Term Goal 1 (Week 1): STG=LTG due to LOS  Skilled Therapeutic Interventions/Progress Updates:   Treatment Session 1 Received pt sitting in WC, pt agreeable to PT treatment, and reported pain 5/10 in L knee (premedicated). Session with emphasis on functional mobility/transfers, generalized strengthening and endurance, dynamic standing balance/coordination, and ambulation. Pt transported to/from room in [facility] dependently for time management purposes.   Pt performed all transfers with RW and CG/min A throughout session. Pt ambulated 78ft x 1 and 64ft x 1 with RW and CGA/min A with WC follow. Cues provided to decrease reliance on UE support on RW and place more weight through LLE. Pt c/o light sensitivity - offered sunglasses but pt politely refused.   In main therapy gym, stood at staircase with min A and navigated 1 6in step with BUE support on L handrail min A. Took seated rest break, then navigated 2 6in steps with BUE support on L handrail and min A. During rest breaks, discussed OPPT follow up. Finally, pt navigated 4 6in steps with BUE support on L handrail and min A using a step to pattern. Limited by pain and dizziness and required frequent and extended rest breaks. Pt performed seated active assisted L heel slides 2x15 with emphasis on knee flexion ROM. Pt ambulated 6ft with RW and min A back towards room. Transferred back into bed and transitioned into supine with CGA. Concluded session with pt semi-reclined in bed, needs within reach, and bed alarm on.   Treatment Session 2 Received pt semi-reclined in bed with interpreter present. Pt agreeable to PT treatment and reported pain 6/10 in L knee - RN notified and  present to adminster pain medication. Session with emphasis on functional mobility/transfers, generalized strengthening and endurance, dynamic standing balance/coordination, and ambulation. Pt transferred semi-reclined<>sitting R EOB with HOB elevated and use of bedrails with CGA and ++ time/effort due to pain.   Pt performed all transfers with RW and min A throughout session. Pt transported to/from room in [facility] dependently for time management purposes. Pt performed seated BLE strengthening on Kinetron at 20 cm/sec for 2 minutes x 2 trials with emphasis on glute/quad strength and knee flexion ROM. Pt then ambulated 5ft x 1 and 26ft x 1 with RW and CGA/min A with close WC follow - limited by pain. Returned to room and ambulated in/out of bathroom with RW and CGA. Pt able to stand and void with supervision and sat in [facility] at sink and performed hand hygeine. Concluded session with pt sitting in WC eating lunch, needs within reach, and seatbelt alarm on.  Therapy Documentation Precautions:  Precautions Precautions: Knee, Fall Precaution Booklet Issued: Yes (comment) Recall of Precautions/Restrictions: Impaired Precaution/Restrictions Comments: no pillow under knee Restrictions Weight Bearing Restrictions Per Provider Order: Yes LLE Weight Bearing Per Provider Order: Weight bearing as tolerated  Therapy/Group: Individual Therapy Therisa HERO Zaunegger Therisa Stains PT, DPT 05/16/2024, 7:00 AM

## 2024-05-16 NOTE — Progress Notes (Signed)
 Occupational Therapy Session Note  Patient Details  Name: Brandon Clark MRN: [id number] Date of Birth: 21-Sep-1940  Today's Date: 05/16/2024 OT Individual Time: 0850-1006 OT Individual Time Calculation (min): 76 min    Short Term Goals: Week 1:  OT Short Term Goal 1 (Week 1): STG=LTG d/t ELOS  Skilled Therapeutic Interventions/Progress Updates:  Skilled OT session completed to address ADL retraining and dynamic standing balance. Pt received supine in bed, agreeable to participate in therapy. Pt reports 7/10 pain in LLE, OT provided pain intervention by repositioning and rest breaks.   Pt attempts to don socks supine in bed, OT provides cues to modify task seated EOB to increase independence. OT dependently dons socks for safety when completing ambulatory transfers. STS with RW requiring Min A with bed elevated, pt requires VC for RW safety. Pt completes functional mobility with RW CGA to toilet requiring tactile cues to keep RW at safe distance when turning. Pt completes toileting with supervision, performing hygiene seated. Pt requesting WC following toileting, toilet>WC stand pivot with CGA to manage RW. Pt self-propels to sink to complete hand hygiene and oral care with independence. Pt c/o increased swelling in LLE. OT provides education on elevating leg and ice to reduce swelling, pt verbalized understanding and will request ice following therapy this date. Pt dependently propelled to day room. Pt completed dynamic standing balance activity completing peg boards to support functional activity in standing and increase WB at LLE. Pt completed task standing at RW with Min A, LOB noted after standing for 2 mins. OT prompted pt for rest break, during rest break pt becoming increasingly tearful when watching others complete therapy. OT provided therapeutic use of self and psychosocial support to continue activity. Pt repeated activity 2x with CGA, VC for orientation of pegs. Pt returned to  room seated in [facility] with chair alarm on and all needs within reach.  Therapy Documentation Precautions:  Precautions Precautions: Knee, Fall Precaution Booklet Issued: Yes (comment) Recall of Precautions/Restrictions: Impaired Precaution/Restrictions Comments: no pillow under knee Restrictions Weight Bearing Restrictions Per Provider Order: Yes LLE Weight Bearing Per Provider Order: Weight bearing as tolerated    Therapy/Group: Individual Therapy  Flavia Bruss Woods-Chance, MS, OTR/L 05/16/2024, 7:53 AM

## 2024-05-16 NOTE — Plan of Care (Signed)
  Problem: Consults Goal: RH GENERAL PATIENT EDUCATION Description: See Patient Education module for education specifics. Outcome: Progressing   Problem: RH BOWEL ELIMINATION Goal: RH STG MANAGE BOWEL WITH ASSISTANCE Description: STG Manage Bowel with mod I  Assistance. Outcome: Progressing Goal: RH STG MANAGE BOWEL W/MEDICATION W/ASSISTANCE Description: STG Manage Bowel with Medication with mod I Assistance. Outcome: Progressing   Problem: RH PAIN MANAGEMENT Goal: RH STG PAIN MANAGED AT OR BELOW PT'S PAIN GOAL Description: < 4 with prns Outcome: Progressing   Problem: RH KNOWLEDGE DEFICIT GENERAL Goal: RH STG INCREASE KNOWLEDGE OF SELF CARE AFTER HOSPITALIZATION Description: Patient and family will be able to manage care at discharge using educational resources for medications, skin care, dietary modifications independently Outcome: Progressing

## 2024-05-16 NOTE — Progress Notes (Signed)
 PROGRESS NOTE   Subjective/Complaints: C/o LLE pain and swelling, oxycontin  added q12, reduce percocet to 1 tab q8H, discussed that memantine can cause swelling  ROS: ice makes his pain worse- improved with percocet and tramadol , +LLE swelling   Objective:   No results found. Recent Labs    05/14/24 0445  WBC 7.8  HGB 11.2*  HCT 33.7*  PLT 467*   Recent Labs    05/14/24 0445  NA 136  K 4.4  CL 97*  CO2 28  GLUCOSE 135*  BUN 16  CREATININE 0.95  CALCIUM 8.7*    Intake/Output Summary (Last 24 hours) at 05/16/2024 0943 Last data filed at 05/15/2024 1130 Gross per 24 hour  Intake 240 ml  Output --  Net 240 ml        Physical Exam: Vital Signs Blood pressure 135/83, pulse 94, temperature (!) 97.5 F (36.4 C), temperature source Oral, resp. rate 16, height 6' (1.829 m), weight 115.6 kg, SpO2 97%. Gen: no distress, normal appearing HEENT: oral mucosa pink and moist, NCAT Cardio: Reg rate Chest: normal effort, normal rate of breathing Abd: soft, non-distended Ext: +LLE edema Psych: pleasant, normal affect Skin: intact Neurologic: Cranial nerves II through XII intact, motor strength is 5/5 in bilateral deltoid, bicep, tricep, grip, right hip flexor, knee extensors, ankle dorsiflexor and plantar flexor 4- Left Hip flex, knee ext 4/5  L ankle PF/DF, improved 11/12   Musculoskeletal: Full range of motion in all 4 extremities. No joint swelling except at left knee, there is diffuse swelling in the left calf with ecchymosis.  Left knee incision is covered by postoperative dressing   Assessment/Plan: 1. Functional deficits which require 3+ hours per day of interdisciplinary therapy in a comprehensive inpatient rehab setting. Physiatrist is providing close team supervision and 24 hour management of active medical problems listed below. Physiatrist and rehab team continue to assess barriers to discharge/monitor  patient progress toward functional and medical goals  Care Tool:  Bathing    Body parts bathed by patient: Right arm, Left arm, Chest, Abdomen, Front perineal area, Buttocks, Face, Left upper leg, Right upper leg   Body parts bathed by helper: Right lower leg, Left lower leg (Pt unable to reach feet at this time d/t swelling in LLE.)     Bathing assist Assist Level: Minimal Assistance - Patient > 75%     Upper Body Dressing/Undressing Upper body dressing   What is the patient wearing?: Pull over shirt    Upper body assist Assist Level: Set up assist    Lower Body Dressing/Undressing Lower body dressing      What is the patient wearing?: Underwear/pull up, Pants     Lower body assist Assist for lower body dressing: Moderate Assistance - Patient 50 - 74%     Toileting Toileting    Toileting assist Assist for toileting: Minimal Assistance - Patient > 75%     Transfers Chair/bed transfer  Transfers assist     Chair/bed transfer assist level: Minimal Assistance - Patient > 75%     Locomotion Ambulation   Ambulation assist      Assist level: Minimal Assistance - Patient > 75% Assistive device: Walker-rolling  Max distance: 7ft   Walk 10 feet activity   Assist     Assist level: Minimal Assistance - Patient > 75% Assistive device: Walker-rolling   Walk 50 feet activity   Assist Walk 50 feet with 2 turns activity did not occur: Safety/medical concerns (pain, dizziness)         Walk 150 feet activity   Assist Walk 150 feet activity did not occur: Safety/medical concerns (pain, dizziness)         Walk 10 feet on uneven surface  activity   Assist Walk 10 feet on uneven surfaces activity did not occur: Safety/medical concerns (pain, dizziness)         Wheelchair     Assist Is the patient using a wheelchair?: Yes Type of Wheelchair: Manual    Wheelchair assist level: Supervision/Verbal cueing Max wheelchair distance: 10ft     Wheelchair 50 feet with 2 turns activity    Assist        Assist Level: Supervision/Verbal cueing   Wheelchair 150 feet activity     Assist  Wheelchair 150 feet activity did not occur: Safety/medical concerns       Blood pressure 135/83, pulse 94, temperature (!) 97.5 F (36.4 C), temperature source Oral, resp. rate 16, height 6' (1.829 m), weight 115.6 kg, SpO2 97%.  Medical Problem List and Plan: 1. 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  Weightbearing as tolerated             -patient may  shower with Left knee wrapped              -ELOS/Goals: 14-16d Sup/min A  Asked nursing to please remove left knee dressing and examine incision site  2.  Left peroneal DVT: continue Eliquis             -antiplatelet therapy: N/A  3. Pain Management:  Scheduled percocet TID, added tramadol  prn, add oxycontin  q12H  4. Mood/Behavior/Sleep/dementia: Elavil 25 mg nightly, decrease namenda to HS since can cause leg swelling which he currently has, melatonin 5 mg nightly, Aricept  10 mg nightly trazodone  50 mg nightly as needed             -antipsychotic agents: N/A  5. Neuropsych/cognition: This patient is capable of making decisions on his own behalf. 6. Skin/Wound Care: Routine skin checks 7. Fluids/Electrolytes/Nutrition: Routine ins and outs with follow-up chemistries 8.  Hypertension.  HCTZ 12.5 mg daily.  Monitor with increased mobility 9.  BPH.  Proscar  5 mg daily, add flomax  after dinner  10.  Hyperlipidemia.  Zocor  11.  Prediabetes.  Hemoglobin A1c 5.9.  SSI 12.  OSA.  Patient does not use CPAP.  Check oxygen saturations every shift 13.  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  Dietary follow-up  14.  Constipation: d/c miralax. Colace 100 mg twice daily, LBM 11/12  15. LLE edema: decrease memantine to HS  LOS: 3 days A FACE TO FACE EVALUATION WAS PERFORMED  Sven SQUIBB Jabarri Stefanelli 05/16/2024, 9:43 AM

## 2024-05-16 NOTE — IPOC Note (Signed)
 Overall Plan of Care [facility]) Patient Details Name: Brandon Clark MRN: [id number] DOB: 10/24/1940  Admitting Diagnosis: Pulmonary emboli [facility])  Hospital Problems: Principal Problem:   Pulmonary emboli (HCC)     Functional Problem List: Nursing Safety, Bowel, Endurance, Medication Management, Pain  PT Balance, Edema, Endurance, Nutrition, Pain, Skin Integrity, Perception  OT Balance, Safety, Cognition, Edema, Endurance, Motor, Pain, Skin Integrity  SLP    TR         Basic ADL's: OT Eating, Grooming, Bathing, Dressing, Toileting     Advanced  ADL's: OT None     Transfers: PT Bed Mobility, Bed to Chair, Car, Lobbyist, Technical Brewer: PT Ambulation, Psychologist, [employer], Stairs     Additional Impairments: OT None  SLP        TR      Anticipated Outcomes Item Anticipated Outcome  Self Feeding Independent  Swallowing      Basic self-care  Mod-I  Toileting  Mod-I   Bathroom Transfers Mod-I  Bowel/Bladder  Manage bowel w mod I assist  Transfers  Mod I with LRAD  Locomotion  Mod I with LRAD  Communication     Cognition     Pain  PAin < 4 with prns  Safety/Judgment  manage safety w cues   Therapy Plan: PT Intensity: Minimum of 1-2 x/day ,45 to 90 minutes PT Frequency: 5 out of 7 days PT Duration Estimated Length of Stay: 7-10 days OT Intensity: Minimum of 1-2 x/day, 45 to 90 minutes OT Frequency: 5 out of 7 days OT Duration/Estimated Length of Stay: 7-10 days     Team Interventions: Nursing Interventions Patient/Family Education, Pain Management, Medication Management, Bowel Management, Disease Management/Prevention, Discharge Planning  PT interventions Ambulation/gait training, Discharge planning, Functional mobility training, Psychosocial support, Therapeutic Activities, Balance/vestibular training, Disease management/prevention, Neuromuscular re-education, Skin care/wound management, Therapeutic Exercise, Wheelchair  propulsion/positioning, Cognitive remediation/compensation, DME/adaptive equipment instruction, Pain management, UE/LE Strength taining/ROM, Community reintegration, Equities Trader education, Museum/gallery curator, UE/LE Coordination activities  OT Interventions Warden/ranger, Discharge planning, Functional electrical stimulation, Pain management, Self Care/advanced ADL retraining, Therapeutic Activities, UE/LE Coordination activities, Cognitive remediation/compensation, Functional mobility training, Patient/family education, Skin care/wound managment, Therapeutic Exercise, DME/adaptive equipment instruction, UE/LE Strength taining/ROM, Wheelchair propulsion/positioning, Disease mangement/prevention, Community reintegration  SLP Interventions    TR Interventions    SW/CM Interventions Discharge Planning, Psychosocial Support, Patient/Family Education, Disease Management/Prevention   Barriers to Discharge MD  Medical stability  Nursing Decreased caregiver support, Home environment access/layout multi level 1 ste no rail full ba on main level w family  PT Decreased caregiver support, Home environment access/layout, Wound Care, Behavior baseline dementia, son works during day, 10+10 steps to get to bedroom/bathroom  OT Inaccessible home environment, Weight bearing restrictions    SLP      SW       Team Discharge Planning: Destination: PT-Home ,OT- Home , SLP-Home Projected Follow-up: PT-Outpatient PT, OT-  Outpatient OT, SLP-None Projected Equipment Needs: PT-To be determined, OT- 3 in 1 bedside comode, SLP-None recommended by SLP Equipment Details: PT-has RW and SPC, OT-  Patient/family involved in discharge planning: PT- Patient,  OT-Patient, SLP-Patient  MD ELOS: 11 days Medical Rehab Prognosis:  Excellent Assessment: The patient has been admitted for CIR therapies with the diagnosis of debility 2/2 PE and DVT. The team will be addressing functional mobility, strength, stamina,  balance, safety, adaptive techniques and equipment, self-care, bowel and bladder mgt, patient and caregiver education. Goals have been set at [facility]I.  Anticipated  discharge destination is home.        See Team Conference Notes for weekly updates to the plan of care

## 2024-05-16 NOTE — Progress Notes (Signed)
 Patient ID: Brandon Clark, male   DOB: 1940/07/26, 83 y.o.   MRN: [id number]  Statement of service delivered.

## 2024-05-17 DIAGNOSIS — I2699 Other pulmonary embolism without acute cor pulmonale: Secondary | ICD-10-CM

## 2024-05-17 LAB — GLUCOSE, CAPILLARY
Glucose-Capillary: 137 mg/dL — ABNORMAL HIGH (ref 70–99)
Glucose-Capillary: 165 mg/dL — ABNORMAL HIGH (ref 70–99)
Glucose-Capillary: 166 mg/dL — ABNORMAL HIGH (ref 70–99)
Glucose-Capillary: 193 mg/dL — ABNORMAL HIGH (ref 70–99)

## 2024-05-17 MED ORDER — MAGNESIUM OXIDE -MG SUPPLEMENT 400 (240 MG) MG PO TABS
200.0000 mg | ORAL_TABLET | Freq: Every day | ORAL | Status: DC
  Administered 2024-05-17 – 2024-05-20 (×4): 200 mg via ORAL
  Filled 2024-05-17 (×4): qty 1

## 2024-05-17 MED ORDER — MEMANTINE HCL 5 MG PO TABS
5.0000 mg | ORAL_TABLET | Freq: Every day | ORAL | Status: DC
  Administered 2024-05-17 – 2024-05-20 (×4): 5 mg via ORAL
  Filled 2024-05-17 (×4): qty 1

## 2024-05-17 MED ORDER — OXYCODONE-ACETAMINOPHEN 5-325 MG PO TABS
2.0000 | ORAL_TABLET | Freq: Three times a day (TID) | ORAL | Status: DC | PRN
  Administered 2024-05-18 – 2024-05-24 (×8): 2 via ORAL
  Filled 2024-05-17 (×8): qty 2

## 2024-05-17 MED ORDER — POLYETHYLENE GLYCOL 3350 17 G PO PACK
17.0000 g | PACK | Freq: Every day | ORAL | Status: DC | PRN
  Administered 2024-05-17 – 2024-05-21 (×2): 17 g via ORAL
  Filled 2024-05-17 (×2): qty 1

## 2024-05-17 NOTE — Progress Notes (Addendum)
 Patient ID: Brandon Clark, male   DOB: 03-07-1941, 83 y.o.   MRN: [id number]  Attempted to make contact with [name] to discuss discharge plans/updates - no answer and voicemail left.  Attempted to make contact with [name] to discuss discharge plans/updates - no answer and voicemail left.   Attempted to make contact with [facility] to discuss discharge plans/updates - no answer and voicemail left. She did text SW who responded for her to call back as soon as possible.   Have not hear back from anyone yet.

## 2024-05-17 NOTE — Progress Notes (Signed)
 Occupational Therapy Session Note  Patient Details  Name: Brandon Clark MRN: [id number] Date of Birth: [birth year]  Session 1 Today's Date: 05/17/2024 OT Individual Time: 8895-8795 OT Individual Time Calculation (min): 60 min  Session 2 Today's Date: 05/17/2024 OT Individual Time: 1347-1501 OT Individual Time Calculation (min): 74 min    Short Term Goals: Week 1:  OT Short Term Goal 1 (Week 1): STG=LTG d/t ELOS  Skilled Therapeutic Interventions/Progress Updates:  Session 1: Skilled OT session completed to address ADL retraining. Pt received seated in [facility] with interpreter present, agreeable to participate in therapy. Pt no pain; however, increased fatigue from previous PT session and declining out of chair participation.  Pt self-propels ~128ft to ortho gym. At previous OT session, pt declines use of DME for bathing. OT simulates side-stepping tub transfer with RW over 6in step. Pt declines return demo and reports he will sit at edge of tub to get in shower. OT verbalizes safety concerns with this transfer method including higher risk for falls and lack of grab bars to reach standing. Pt understands safety concerns and requests suggestions. OT demos TTB, pt grows increasingly frustrated d/t limited understanding of course of healing and time frame at CIR. Pt believes he will return home in 6-8 weeks when L knee replacement is completely healed. OT attempts to provide redirection and offer different seating systems for bathing. Pt agreeable to shower chair; however, d/t CLOF OT is not recommending shower chair. OT provides education on physical ability needed to use shower chair. Pt insists he will complete in home environment and declines shower chair transfer this session. Pt dependently propelled back to room, WC>EOB stand pivot with CGA to manage RW. EOB>Supine Min A to support LLE. Pt supine in bed with bed alarm on and all needs within reach.   Session 2: Skilled OT session  completed to address functional mobility and ADL retraining. Pt received supine in bed, agreeable to participate in therapy. Pt reports no pain.  Pt requested to void, Supine>EOB completed with CGA for safety. STS completed with Min A to reach standing at [facility], pt completes functional mobility to toilet with CGA. Pt completed toileting with supervision. Pt verbalized difficulty voiding, OT notified RN to f/u post session. Pt dependently propelled to dayroom. OT demos sock aid, reacher, and dressing stick to support with LB dressing. Pt returns demo with verbal cues, to don/doff sock on RLE, declines practice on LLE. Pt receptive of AE, OT provided medical equipment and AD catalog to view options for purchase. Pt declines hand-out and reports can remember catalog. Pt completed 2 trials of functional mobility 55ft with RW with CGA, displaying carryover from previous sessions with RW safety when turning to sit for rest breaks. Pt returned to room seated in [facility] with chair alarm on and nursing present.  Therapy Documentation Precautions:  Precautions Precautions: Knee, Fall Precaution Booklet Issued: Yes (comment) Recall of Precautions/Restrictions: Impaired Precaution/Restrictions Comments: no pillow under knee Restrictions Weight Bearing Restrictions Per Provider Order: Yes LLE Weight Bearing Per Provider Order: Weight bearing as tolerated    Therapy/Group: Individual Therapy  Kirstin Kugler Woods-Chance, MS, OTR/L 05/17/2024, 7:55 AM

## 2024-05-17 NOTE — Progress Notes (Signed)
 PROGRESS NOTE   Subjective/Complaints: Patient reports pain is improved with addition of oxycontin  but still remains severe at 7/10, he would like prn percocet increased  ROS: ice makes his pain worse- improved with percocet and tramadol , +LLE swelling and pain   Objective:   No results found. No results for input(s): WBC, HGB, HCT, PLT in the last 72 hours.  No results for input(s): NA, K, CL, CO2, GLUCOSE, BUN, CREATININE, CALCIUM in the last 72 hours.  No intake or output data in the 24 hours ending 05/17/24 1046       Physical Exam: Vital Signs Blood pressure 118/74, pulse 96, temperature 97.9 F (36.6 C), resp. rate 19, height 6' (1.829 m), weight 115.6 kg, SpO2 94%. Gen: no distress, normal appearing HEENT: oral mucosa pink and moist, NCAT Cardio: Reg rate Chest: normal effort, normal rate of breathing Abd: soft, non-distended Ext: +LLE edema Psych: pleasant, normal affect Skin: intact Neurologic: Cranial nerves II through XII intact, motor strength is 5/5 in bilateral deltoid, bicep, tricep, grip, right hip flexor, knee extensors, ankle dorsiflexor and plantar flexor 4- Left Hip flex, knee ext 4/5  L ankle PF/DF, stable 11/14   Musculoskeletal: Full range of motion in all 4 extremities. No joint swelling except at left knee, there is diffuse swelling in the left calf with ecchymosis.  Left knee incision is covered by postoperative dressing   Assessment/Plan: 1. Functional deficits which require 3+ hours per day of interdisciplinary therapy in a comprehensive inpatient rehab setting. Physiatrist is providing close team supervision and 24 hour management of active medical problems listed below. Physiatrist and rehab team continue to assess barriers to discharge/monitor patient progress toward functional and medical goals  Care Tool:  Bathing    Body parts bathed by patient: Right arm,  Left arm, Chest, Abdomen, Front perineal area, Buttocks, Face, Left upper leg, Right upper leg   Body parts bathed by helper: Right lower leg, Left lower leg (Pt unable to reach feet at this time d/t swelling in LLE.)     Bathing assist Assist Level: Minimal Assistance - Patient > 75%     Upper Body Dressing/Undressing Upper body dressing   What is the patient wearing?: Pull over shirt    Upper body assist Assist Level: Set up assist    Lower Body Dressing/Undressing Lower body dressing      What is the patient wearing?: Underwear/pull up, Pants     Lower body assist Assist for lower body dressing: Moderate Assistance - Patient 50 - 74%     Toileting Toileting    Toileting assist Assist for toileting: Minimal Assistance - Patient > 75%     Transfers Chair/bed transfer  Transfers assist     Chair/bed transfer assist level: Minimal Assistance - Patient > 75%     Locomotion Ambulation   Ambulation assist      Assist level: Minimal Assistance - Patient > 75% Assistive device: Walker-rolling Max distance: 71ft   Walk 10 feet activity   Assist     Assist level: Minimal Assistance - Patient > 75% Assistive device: Walker-rolling   Walk 50 feet activity   Assist Walk 50 feet with 2 turns  activity did not occur: Safety/medical concerns (pain, dizziness)  Assist level: Minimal Assistance - Patient > 75% Assistive device: Walker-rolling    Walk 150 feet activity   Assist Walk 150 feet activity did not occur: Safety/medical concerns (pain, dizziness)         Walk 10 feet on uneven surface  activity   Assist Walk 10 feet on uneven surfaces activity did not occur: Safety/medical concerns (pain, dizziness)         Wheelchair     Assist Is the patient using a wheelchair?: Yes Type of Wheelchair: Manual    Wheelchair assist level: Supervision/Verbal cueing Max wheelchair distance: 19ft    Wheelchair 50 feet with 2 turns  activity    Assist        Assist Level: Supervision/Verbal cueing   Wheelchair 150 feet activity     Assist  Wheelchair 150 feet activity did not occur: Safety/medical concerns       Blood pressure 118/74, pulse 96, temperature 97.9 F (36.6 C), resp. rate 19, height 6' (1.829 m), weight 115.6 kg, SpO2 94%.  Medical Problem List and Plan: 1. 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  Weightbearing as tolerated             -patient may  shower with Left knee wrapped              -ELOS/Goals: 14-16d Sup/min A  Asked nursing to please remove left knee dressing and examine incision site  Grounds pass ordered  2.  Left peroneal DVT: continue Eliquis             -antiplatelet therapy: N/A  3. Pain Management:  increase prn percocet to 2 tabs q8H, added tramadol  prn, add oxycontin  q12H  4. Mood/Behavior/Sleep/dementia: Elavil 25 mg nightly, decrease namenda to HS since can cause leg swelling which he currently has, melatonin 5 mg nightly, Aricept  10 mg nightly trazodone  50 mg nightly as needed             -antipsychotic agents: N/A  5. Neuropsych/cognition: This patient is capable of making decisions on his own behalf. 6. Skin/Wound Care: Routine skin checks 7. Fluids/Electrolytes/Nutrition: Routine ins and outs with follow-up chemistries 8.  Hypertension.  HCTZ 12.5 mg daily.  Monitor with increased mobility 9.  BPH.  Proscar  5 mg daily, add flomax  after dinner  10.  Hyperlipidemia.  Zocor  11.  Prediabetes.  Hemoglobin A1c 5.9.  SSI 12.  OSA.  Patient does not use CPAP.  Check oxygen saturations every shift  13.  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  Dietary follow-up, check magnesium  level on Monday  14.  Constipation: d/c miralax. Colace 100 mg twice daily, LBM 11/123, add magnesium  oxide 200mg  daily  15. LLE edema: decrease memantine to 5mg  HS  LOS: 4 days A FACE TO FACE EVALUATION WAS  PERFORMED  Sven P Jonael Paradiso 05/17/2024, 10:46 AM

## 2024-05-17 NOTE — Progress Notes (Signed)
 Physical Therapy Session Note  Patient Details  Name: Brandon Clark MRN: [id number] Date of Birth: 10/18/1940  Today's Date: 05/17/2024 PT Individual Time: 0830-0940 PT Individual Time Calculation (min): 70 min   Short Term Goals: Week 1:  PT Short Term Goal 1 (Week 1): STG=LTG due to LOS  Skilled Therapeutic Interventions/Progress Updates:   Received pt semi-reclined in bed with interpreter at bedside. Pt agreeable to PT treatment and reported pain 7/10 in L knee - RN notified and present to administer pain medication. Session with emphasis on functional mobility/transfers, generalized strengthening and endurance, toileting, dynamic standing balance/coordination, ambulation, and stair navigation. Pt transferred semi-reclined<>sitting R EOB with HOB elevated and use of bedrails and close supervision with increased effort - cues provided for breathing techniques and to avoid relying on momentum. Pt reports using multiple pillows at home to raise HOB. Stood from elevated EOB with RW and light min A with cues for hand placement and ambulated in/out of bathroom with RW and CGA. Pt requesting privacy and able to manage clothing without assist and continent of bladder.   Sat in WC at sink and performed hand hygiene and washed face with setup assist. MD arrived for morning rounds. Pt transported to/from room in [facility] dependently for time management purposes. Pt performed remainder of transfers with RW and min A and ambulated 57ft x 1 and 51ft x 1 with RW and CGA with close WC follow - limited by pain, dizziness, and fatigue. Stood at staircase and navigated 2 6in steps with L handrail and min A using a lateral stepping technique. Took seated rest break, then navigated additional 4 6in steps with L handrail and min A using lateral stepping technique. Pt then ambulated additional 20ft x 1 and 14ft x 1 with RW and CGA. Returned to room and concluded session with pt sitting in WC, needs within reach,  and seatbelt alarm on.   Therapy Documentation Precautions:  Precautions Precautions: Knee, Fall Precaution Booklet Issued: Yes (comment) Recall of Precautions/Restrictions: Impaired Precaution/Restrictions Comments: no pillow under knee Restrictions Weight Bearing Restrictions Per Provider Order: Yes LLE Weight Bearing Per Provider Order: Weight bearing as tolerated  Therapy/Group: Individual Therapy Therisa HERO Zaunegger Therisa Stains PT, DPT 05/17/2024, 6:53 AM

## 2024-05-17 NOTE — Plan of Care (Signed)
  Problem: Consults Goal: RH GENERAL PATIENT EDUCATION Description: See Patient Education module for education specifics. Outcome: Progressing   Problem: RH BOWEL ELIMINATION Goal: RH STG MANAGE BOWEL WITH ASSISTANCE Description: STG Manage Bowel with mod I  Assistance. Outcome: Progressing Goal: RH STG MANAGE BOWEL W/MEDICATION W/ASSISTANCE Description: STG Manage Bowel with Medication with mod I Assistance. Outcome: Progressing   Problem: RH PAIN MANAGEMENT Goal: RH STG PAIN MANAGED AT OR BELOW PT'S PAIN GOAL Description: < 4 with prns Outcome: Progressing   Problem: RH KNOWLEDGE DEFICIT GENERAL Goal: RH STG INCREASE KNOWLEDGE OF SELF CARE AFTER HOSPITALIZATION Description: Patient and family will be able to manage care at discharge using educational resources for medications, skin care, dietary modifications independently Outcome: Progressing

## 2024-05-17 NOTE — Progress Notes (Addendum)
 Patient ID: Brandon Clark, male   DOB: 1941-05-30, 83 y.o.   MRN: [id number]  Spoke with [name] with an update.   Someone is always in the home to provide the supervision. The conformability level for him to be downstairs will probably pose an issue but they can accommodate. Family requesting HH due to lack of transportation at the moment but will be able to do OP within the next few weeks.   Son, Rozann and Margery will provide transportation home.  Has already been working with [employer] about DME needs. SW will verify.

## 2024-05-18 DIAGNOSIS — R5381 Other malaise: Principal | ICD-10-CM

## 2024-05-18 LAB — GLUCOSE, CAPILLARY
Glucose-Capillary: 146 mg/dL — ABNORMAL HIGH (ref 70–99)
Glucose-Capillary: 128 mg/dL — ABNORMAL HIGH (ref 70–99)
Glucose-Capillary: 106 mg/dL — ABNORMAL HIGH (ref 70–99)
Glucose-Capillary: 134 mg/dL — ABNORMAL HIGH (ref 70–99)

## 2024-05-18 MED ORDER — SENNA 8.6 MG PO TABS
2.0000 | ORAL_TABLET | Freq: Every day | ORAL | Status: DC
  Administered 2024-05-19 – 2024-05-22 (×4): 17.2 mg via ORAL
  Filled 2024-05-18 (×4): qty 2

## 2024-05-18 MED ORDER — SORBITOL 70 % SOLN
45.0000 mL | Freq: Once | Status: AC
  Administered 2024-05-18: 45 mL via ORAL
  Filled 2024-05-18: qty 60

## 2024-05-18 NOTE — Plan of Care (Signed)
  Problem: RH Balance Goal: LTG Patient will maintain dynamic standing balance (PT) Description: LTG:  Patient will maintain dynamic standing balance with assistance during mobility activities (PT) Flowsheets (Taken 05/18/2024 1218) LTG: Pt will maintain dynamic standing balance during mobility activities with:: (downgraded due to cognitive impairments and difficulty problem solving) Supervision/Verbal cueing   Problem: Sit to Stand Goal: LTG:  Patient will perform sit to stand with assistance level (PT) Description: LTG:  Patient will perform sit to stand with assistance level (PT) Flowsheets (Taken 05/18/2024 1218) LTG: PT will perform sit to stand in preparation for functional mobility with assistance level: (downgraded due to cognitive impairments and difficulty problem solving) Supervision/Verbal cueing   Problem: RH Bed Mobility Goal: LTG Patient will perform bed mobility with assist (PT) Description: LTG: Patient will perform bed mobility with assistance, with/without cues (PT). Flowsheets (Taken 05/18/2024 1218) LTG: Pt will perform bed mobility with assistance level of: (downgraded due to cognitive impairments and difficulty problem solving) Supervision/Verbal cueing   Problem: RH Bed to Chair Transfers Goal: LTG Patient will perform bed/chair transfers w/assist (PT) Description: LTG: Patient will perform bed to chair transfers with assistance (PT). Flowsheets (Taken 05/18/2024 1218) LTG: Pt will perform Bed to Chair Transfers with assistance level: (downgraded due to cognitive impairments and difficulty problem solving) Supervision/Verbal cueing   Problem: RH Ambulation Goal: LTG Patient will ambulate in controlled environment (PT) Description: LTG: Patient will ambulate in a controlled environment, # of feet with assistance (PT). Flowsheets (Taken 05/18/2024 1218) LTG: Pt will ambulate in controlled environ  assist needed:: (downgraded due to cognitive impairments and  difficulty problem solving) Supervision/Verbal cueing LTG: Ambulation distance in controlled environment: 155ft with LRAD Goal: LTG Patient will ambulate in home environment (PT) Description: LTG: Patient will ambulate in home environment, # of feet with assistance (PT). Flowsheets (Taken 05/18/2024 1218) LTG: Pt will ambulate in home environ  assist needed:: (downgraded due to cognitive impairments and difficulty problem solving) Supervision/Verbal cueing LTG: Ambulation distance in home environment: 36ft with LRAD   Problem: RH Stairs Goal: LTG Patient will ambulate up and down stairs w/assist (PT) Description: LTG: Patient will ambulate up and down # of stairs with assistance (PT) Flowsheets (Taken 05/18/2024 1218) LTG: Pt will ambulate up/down stairs assist needed:: (downgraded due to cognitive impairments and difficulty problem solving) Supervision/Verbal cueing LTG: Pt will  ambulate up and down number of stairs: 10 steps with 2 handrails

## 2024-05-18 NOTE — Progress Notes (Signed)
 PROGRESS NOTE   Subjective/Complaints:  Pt reports needs his pain meds this AM- hasn't received yet.  Hasn't eaten yet- plans to but not yet.  Irritable for some reason- not clear why, but rude to interpretor and upset .  LBM last night but still feels really constipated.   [place] they 'threw his medicine in trash to help him have BM-   ROS:   Pt denies SOB, abd pain, CP, N/V/ (+)C/D, and vision changes Per HPI     Objective:   No results found. No results for input(s): WBC, HGB, HCT, PLT in the last 72 hours.  No results for input(s): NA, K, CL, CO2, GLUCOSE, BUN, CREATININE, CALCIUM in the last 72 hours.  No intake or output data in the 24 hours ending 05/18/24 1303       Physical Exam: Vital Signs Blood pressure 133/85, pulse 92, temperature 97.9 F (36.6 C), temperature source Oral, resp. rate 16, height 6' (1.829 m), weight 115.6 kg, SpO2 98%.    General: awake, alert, irritable with interpretor; raised voice; sitting up in bed-  NAD HENT: conjugate gaze; oropharynx moist CV: regular rate and rhythm- rate in 90's; no JVD Pulmonary: CTA B/L; no W/R/R- good air movement GI: soft, moderate distension; hypoactive Psychiatric: irritable Neurological: alert  Neurologic: Cranial nerves II through XII intact, motor strength is 5/5 in bilateral deltoid, bicep, tricep, grip, right hip flexor, knee extensors, ankle dorsiflexor and plantar flexor 4- Left Hip flex, knee ext 4/5  L ankle PF/DF, stable 11/14   Musculoskeletal: Full range of motion in all 4 extremities. No joint swelling except at left knee, there is diffuse swelling in the left calf with ecchymosis.  Left knee incision is covered by postoperative dressing   Assessment/Plan: 1. Functional deficits which require 3+ hours per day of interdisciplinary therapy in a comprehensive inpatient rehab setting. Physiatrist is providing  close team supervision and 24 hour management of active medical problems listed below. Physiatrist and rehab team continue to assess barriers to discharge/monitor patient progress toward functional and medical goals  Care Tool:  Bathing    Body parts bathed by patient: Right arm, Left arm, Chest, Abdomen, Front perineal area, Buttocks, Face, Left upper leg, Right upper leg   Body parts bathed by helper: Right lower leg, Left lower leg (Pt unable to reach feet at this time d/t swelling in LLE.)     Bathing assist Assist Level: Minimal Assistance - Patient > 75%     Upper Body Dressing/Undressing Upper body dressing   What is the patient wearing?: Pull over shirt    Upper body assist Assist Level: Set up assist    Lower Body Dressing/Undressing Lower body dressing      What is the patient wearing?: Underwear/pull up, Pants     Lower body assist Assist for lower body dressing: Moderate Assistance - Patient 50 - 74%     Toileting Toileting    Toileting assist Assist for toileting: Minimal Assistance - Patient > 75%     Transfers Chair/bed transfer  Transfers assist     Chair/bed transfer assist level: Contact Guard/Touching assist     Locomotion Ambulation   Ambulation assist  Assist level: Contact Guard/Touching assist Assistive device: Rollator Max distance: 36ft   Walk 10 feet activity   Assist     Assist level: Contact Guard/Touching assist Assistive device: Rollator   Walk 50 feet activity   Assist Walk 50 feet with 2 turns activity did not occur: Safety/medical concerns (pain, dizziness)  Assist level: Minimal Assistance - Patient > 75% Assistive device: Walker-rolling    Walk 150 feet activity   Assist Walk 150 feet activity did not occur: Safety/medical concerns (pain, dizziness)         Walk 10 feet on uneven surface  activity   Assist Walk 10 feet on uneven surfaces activity did not occur: Safety/medical concerns  (pain, dizziness)         Wheelchair     Assist Is the patient using a wheelchair?: Yes Type of Wheelchair: Manual    Wheelchair assist level: Supervision/Verbal cueing Max wheelchair distance: 26ft    Wheelchair 50 feet with 2 turns activity    Assist        Assist Level: Supervision/Verbal cueing   Wheelchair 150 feet activity     Assist  Wheelchair 150 feet activity did not occur: Safety/medical concerns       Blood pressure 133/85, pulse 92, temperature 97.9 F (36.6 C), temperature source Oral, resp. rate 16, height 6' (1.829 m), weight 115.6 kg, SpO2 98%.  Medical Problem List and Plan: 1. 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  Weightbearing as tolerated             -patient may  shower with Left knee wrapped              -ELOS/Goals: 14-16d Sup/min A  Asked nursing to please remove left knee dressing and examine incision site  Grounds pass ordered  Con't CIR PT and OT 2.  Left peroneal DVT: continue Eliquis             -antiplatelet therapy: N/A  3. Pain Management:  increase prn percocet to 2 tabs q8H, added tramadol  prn, add oxycontin  20 mg q12H- added 11/13  11/15- pt reports pain is doing better when gets meds/is due this AM 4. Mood/Behavior/Sleep/dementia: Elavil 25 mg nightly, decrease namenda to HS since can cause leg swelling which he currently has, melatonin 5 mg nightly, Aricept  10 mg nightly trazodone  50 mg nightly as needed             -antipsychotic agents: N/A  5. Neuropsych/cognition: This patient is capable of making decisions on his own behalf. 6. Skin/Wound Care: Routine skin checks 7. Fluids/Electrolytes/Nutrition: Routine ins and outs with follow-up chemistries 8.  Hypertension.  HCTZ 12.5 mg daily.  Monitor with increased mobility  11/15- BP controlled- con't regimen and monitor with activity 9.  BPH.  Proscar  5 mg daily, add flomax  after dinner  10.   Hyperlipidemia.  Zocor  11.  Prediabetes.  Hemoglobin A1c 5.9.  SSI 12.  OSA.  Patient does not use CPAP.  Check oxygen saturations every shift  13.  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  Dietary follow-up, check magnesium  level on Monday  11/15- BMI down to 34.56- is improving  14.  Constipation: d/c miralax. Colace 100 mg twice daily, LBM 11/12, add magnesium  oxide 200mg  daily  11/15- will give Sorbitol 45 cc- since feels very constipated even after BM yesterday- also added Senna 2 tabs daily to start 11/16 15. LLE edema: decrease memantine to 5mg  HS  I spent a total  of 43   minutes on total care today- >50% coordination of care- due to  D/w pt via interpretor- about bowels, BP; and BMI- also d/w nursing about needing pain meds, bathroom, etc- reviewed chart, labs, vitals and B/B  LOS: 5 days A FACE TO FACE EVALUATION WAS PERFORMED  Yitzel Shasteen 05/18/2024, 1:03 PM

## 2024-05-18 NOTE — Progress Notes (Signed)
 Occupational Therapy Session Note  Patient Details  Name: Brandon Clark MRN: [id number] Date of Birth: [birth year]  Today's Date: 05/18/2024 OT Individual Time: 0905-1003 OT Individual Time Calculation (min): 58 min    Short Term Goals: Week 1:  OT Short Term Goal 1 (Week 1): STG=LTG d/t ELOS  Skilled Therapeutic Interventions/Progress Updates:  Skilled OT session completed to address dynamic standing balance and functional mobility. Pt received seated in [facility] with interpreter present, agreeable to participate in therapy. Pt reports no pain.   Pt declined all self-care needs at this session and requests standing activities during therapy. Pt self-propels ~77ft, OT propels to dayroom. Pt completed dynamic standing balance activity to increase WB on LLE to support independence with ADLs and functional activity. Pt sustained standing 4 minutes during activity requiring seated rest breaks throughout d/t fatigue. Pt requiring supervision A to complete wooden puzzle d/t cognitive deficits, with VC pt able to navigate puzzle in standing. Pt required Min A to complete peg board in standing d/t visual deficits, pt respectfully declined wearing glasses during session. Pt completed functional mobility with RW 51ft+50ft with 2 seated rest breaks. Pt dependently propelled back to room seated in [facility] with chair alarm on and all needs within reach.  Therapy Documentation Precautions:  Precautions Precautions: Knee, Fall Precaution Booklet Issued: Yes (comment) Recall of Precautions/Restrictions: Impaired Precaution/Restrictions Comments: no pillow under knee Restrictions Weight Bearing Restrictions Per Provider Order: Yes LLE Weight Bearing Per Provider Order: Weight bearing as tolerated   Therapy/Group: Individual Therapy  Arhianna Ebey Woods-Chance, MS, OTR/L 05/18/2024, 8:18 AM

## 2024-05-18 NOTE — Progress Notes (Signed)
 Physical Therapy Session Note  Patient Details  Name: Brandon Clark MRN: [id number] Date of Birth: 08/11/1940  Today's Date: 05/18/2024 PT Individual Time: 8954-8844 and 1300-1354 PT Individual Time Calculation (min): 70 min and 54 min  Short Term Goals: Week 1:  PT Short Term Goal 1 (Week 1): STG=LTG due to LOS  Skilled Therapeutic Interventions/Progress Updates:   Treatment Session 1 Received pt sitting in [facility] with interpreter at bedside. Pt agreeable to PT treatment and reported pain 6/10 in L knee and reporting intermittent dizziness throuhgout. Session with emphasis on functional mobility/transfers, toileting, generalized strengthening and endurance, dynamic standing balance/coordination, ambulation, and stair navigation.   Pt reported urge to toilet - stood with RW and CGA and ambulated in/out of bathroom with RW and CGA fading to close supervision. Pt requesting privacy while toileting (left door cracked). Pt able to manage clothing and continent of small BM (with ++ time) but reported feeling gassy. Pt performed hygiene management without assist and sat in WC at sink and performed hand hygiene mod I (declined standing).   Introduced pt to rollator and educated on geographical information systems officer and importance of backing rollator against wall prior to sitting. Pt performed all transfers with rollator and CGA throughout session. Pt ambulated 73ft x 1 and 71ft x 1 with rollator and CGA with 1 seated rest break on rollator. Pt required mod cues for rollator safety. Pt reported not liking rollator, stating it moves too fast, however educated pt on purpose of rollator to take seated rest breaks as pt is currently relying on having WC behind him to sit.   Ambulated to staircase and navigated 4 6in steps with bilateral handrails and CGA ascending and descending with a step to pattern with cues for up with the good, down with the bad technique. Took seated rest break due  to pt feeling shaky, then navigated additional 4 6in steps with bilateral handrails and CGA using same technique. Worked on blocked practice sit<>stands x10 reps from elevated EOM with CGA. Encouraged pt to attempt without UE support but pt refused. Pt ambulated 72ft x 1 and 54ft x 1 with rollator and CGA back to room with 1 seated rest break. Concluded session with pt sitting in WC, needs within reach, and seatbelt alarm on.   Treatment Session 2 Received pt sitting in [facility] with interpreter at bedside. Pt agreeable to PT treatment and reported pain 7/10 in L knee (premedicated). Session with emphasis on functional mobility/transfers, generalized strengthening and endurance, dynamic standing balance/coordination, and ambulation. Pt performed all transfers with rollator and CGA throughout session - cues for rollator safety and brake management.   Pt ambulated 58ft x 2 trials with rollator and CGA to ortho gym. Pt performed seated BUE/BLE strengthening on Nustep at workload 1 for 8 minutes with emphasis on knee ROM - pt lacking full extension. Pt on seat 14 but unwilling to let therapist move him up to increase knee flexion. Total of 161 steps, 0.1 miles, 14 SPM, and 1.3 METs. Ambulated to EOM and performed the following exercises with emphasis on LE strength/ROM and required verbal and tactile cues as well as encouragement to push through discomfort: -mini squats 2x10  -L TKE 2x10 -heel raises 2x10  -alternating marches x13 to fatigue Returned to room and concluded session with pt sitting in WC, needs within reach, and seatbelt alarm on.   Therapy Documentation Precautions:  Precautions Precautions: Knee, Fall Precaution Booklet Issued: Yes (comment) Recall of Precautions/Restrictions: Impaired Precaution/Restrictions Comments: no  pillow under knee Restrictions Weight Bearing Restrictions Per Provider Order: Yes LLE Weight Bearing Per Provider Order: Weight bearing as tolerated  Therapy/Group:  Individual Therapy Therisa HERO Zaunegger Therisa Stains PT, DPT 05/18/2024, 6:53 AM

## 2024-05-19 LAB — GLUCOSE, CAPILLARY
Glucose-Capillary: 126 mg/dL — ABNORMAL HIGH (ref 70–99)
Glucose-Capillary: 132 mg/dL — ABNORMAL HIGH (ref 70–99)
Glucose-Capillary: 107 mg/dL — ABNORMAL HIGH (ref 70–99)
Glucose-Capillary: 180 mg/dL — ABNORMAL HIGH (ref 70–99)

## 2024-05-19 NOTE — Progress Notes (Signed)
 PROGRESS NOTE   Subjective/Complaints:  Pt had 2 large BM's yesterday- feels better. Sorbitol worked.   Pain meds working- but needs this AM.  Mouth dry- needs something to drink.    ROS:     Pt denies SOB, abd pain, CP, N/V/C/D, and vision changes  Per HPI     Objective:   No results found. No results for input(s): WBC, HGB, HCT, PLT in the last 72 hours.  No results for input(s): NA, K, CL, CO2, GLUCOSE, BUN, CREATININE, CALCIUM in the last 72 hours.   Intake/Output Summary (Last 24 hours) at 05/19/2024 1201 Last data filed at 05/18/2024 1506 Gross per 24 hour  Intake 240 ml  Output --  Net 240 ml         Physical Exam: Vital Signs Blood pressure 112/78, pulse 88, temperature 98.1 F (36.7 C), temperature source Oral, resp. rate 16, height 6' (1.829 m), weight 115.6 kg, SpO2 96%.      General: awake, alert, appropriate,  sitting up in bed after woke him up; NAD HENT: conjugate gaze; oropharynx very dry- gave water CV: regular rate and rhythm; no JVD Pulmonary: CTA B/L; no W/R/R- good air movement GI: soft, NT, ND, (+)BS- more normoactive Psychiatric: appropriate- less irritable today Neurological: Ox3   Neurologic: Cranial nerves II through XII intact, motor strength is 5/5 in bilateral deltoid, bicep, tricep, grip, right hip flexor, knee extensors, ankle dorsiflexor and plantar flexor 4- Left Hip flex, knee ext 4/5  L ankle PF/DF, stable 11/14   Musculoskeletal: Full range of motion in all 4 extremities. No joint swelling except at left knee, there is diffuse swelling in the left calf with ecchymosis.  Left knee incision is covered by postoperative dressing   Assessment/Plan: 1. Functional deficits which require 3+ hours per day of interdisciplinary therapy in a comprehensive inpatient rehab setting. Physiatrist is providing close team supervision and 24 hour  management of active medical problems listed below. Physiatrist and rehab team continue to assess barriers to discharge/monitor patient progress toward functional and medical goals  Care Tool:  Bathing    Body parts bathed by patient: Right arm, Left arm, Chest, Abdomen, Front perineal area, Buttocks, Face, Left upper leg, Right upper leg   Body parts bathed by helper: Right lower leg, Left lower leg (Pt unable to reach feet at this time d/t swelling in LLE.)     Bathing assist Assist Level: Minimal Assistance - Patient > 75%     Upper Body Dressing/Undressing Upper body dressing   What is the patient wearing?: Pull over shirt    Upper body assist Assist Level: Set up assist    Lower Body Dressing/Undressing Lower body dressing      What is the patient wearing?: Underwear/pull up, Pants     Lower body assist Assist for lower body dressing: Moderate Assistance - Patient 50 - 74%     Toileting Toileting    Toileting assist Assist for toileting: Minimal Assistance - Patient > 75%     Transfers Chair/bed transfer  Transfers assist     Chair/bed transfer assist level: Contact Guard/Touching assist     Locomotion Ambulation   Ambulation assist  Assist level: Contact Guard/Touching assist Assistive device: Rollator Max distance: 60ft   Walk 10 feet activity   Assist     Assist level: Contact Guard/Touching assist Assistive device: Rollator   Walk 50 feet activity   Assist Walk 50 feet with 2 turns activity did not occur: Safety/medical concerns (pain, dizziness)  Assist level: Minimal Assistance - Patient > 75% Assistive device: Walker-rolling    Walk 150 feet activity   Assist Walk 150 feet activity did not occur: Safety/medical concerns (pain, dizziness)         Walk 10 feet on uneven surface  activity   Assist Walk 10 feet on uneven surfaces activity did not occur: Safety/medical concerns (pain, dizziness)          Wheelchair     Assist Is the patient using a wheelchair?: Yes Type of Wheelchair: Manual    Wheelchair assist level: Supervision/Verbal cueing Max wheelchair distance: 27ft    Wheelchair 50 feet with 2 turns activity    Assist        Assist Level: Supervision/Verbal cueing   Wheelchair 150 feet activity     Assist  Wheelchair 150 feet activity did not occur: Safety/medical concerns       Blood pressure 112/78, pulse 88, temperature 98.1 F (36.7 C), temperature source Oral, resp. rate 16, height 6' (1.829 m), weight 115.6 kg, SpO2 96%.  Medical Problem List and Plan: 1. 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  Weightbearing as tolerated             -patient may  shower with Left knee wrapped              -ELOS/Goals: 14-16d Sup/min A  Asked nursing to please remove left knee dressing and examine incision site  Grounds pass ordered  Con't CIR PT and OT 2.  Left peroneal DVT: continue Eliquis             -antiplatelet therapy: N/A  3. Pain Management:  increase prn percocet to 2 tabs q8H, added tramadol  prn, add oxycontin  20 mg q12H- added 11/13  11/15- pt reports pain is doing better when gets meds/is due this AM 4. Mood/Behavior/Sleep/dementia: Elavil 25 mg nightly, decrease namenda to HS since can cause leg swelling which he currently has, melatonin 5 mg nightly, Aricept  10 mg nightly trazodone  50 mg nightly as needed             -antipsychotic agents: N/A  5. Neuropsych/cognition: This patient is capable of making decisions on his own behalf. 6. Skin/Wound Care: Routine skin checks 7. Fluids/Electrolytes/Nutrition: Routine ins and outs with follow-up chemistries 8.  Hypertension.  HCTZ 12.5 mg daily.  Monitor with increased mobility  11/15-11/16-  BP controlled- con't regimen and monitor with activity 9.  BPH.  Proscar  5 mg daily, add flomax  after dinner  10.  Hyperlipidemia.   Zocor  11.  Prediabetes.  Hemoglobin A1c 5.9.  SSI 12.  OSA.  Patient does not use CPAP.  Check oxygen saturations every shift  13.  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  Dietary follow-up, check magnesium  level on Monday  11/15- BMI down to 34.56- is improving  14.  Constipation: d/c miralax. Colace 100 mg twice daily, LBM 11/12, add magnesium  oxide 200mg  daily  11/15- will give Sorbitol 45 cc- since feels very constipated even after BM yesterday- also added Senna 2 tabs daily to start 11/16  11/16- 2 large BM's yesterday- doing better- con't regimen 15. LLE  edema: decrease memantine to 5mg  HS    LOS: 6 days A FACE TO FACE EVALUATION WAS PERFORMED  Jesper Stirewalt 05/19/2024, 12:01 PM

## 2024-05-20 LAB — GLUCOSE, CAPILLARY
Glucose-Capillary: 131 mg/dL — ABNORMAL HIGH (ref 70–99)
Glucose-Capillary: 119 mg/dL — ABNORMAL HIGH (ref 70–99)
Glucose-Capillary: 172 mg/dL — ABNORMAL HIGH (ref 70–99)
Glucose-Capillary: 188 mg/dL — ABNORMAL HIGH (ref 70–99)

## 2024-05-20 LAB — MAGNESIUM: Magnesium: 1.9 mg/dL (ref 1.7–2.4)

## 2024-05-20 MED ORDER — MAGNESIUM OXIDE -MG SUPPLEMENT 400 (240 MG) MG PO TABS
400.0000 mg | ORAL_TABLET | Freq: Every day | ORAL | Status: DC
  Administered 2024-05-21: 400 mg via ORAL
  Filled 2024-05-20: qty 1

## 2024-05-20 NOTE — Progress Notes (Signed)
 Physical Therapy Session Note  Patient Details  Name: Brandon Clark MRN: [id number] Date of Birth: May 15, 1941  Today's Date: 05/20/2024 PT Individual Time: 0902-0949 PT Individual Time Calculation (min): 47 min   Short Term Goals: Week 1:  PT Short Term Goal 1 (Week 1): STG=LTG due to LOS   Skilled Therapeutic Interventions/Progress Updates:    Session focused on functional transfers, gait training, and overall strengthening/endurance. Pt performed sit <> stands with mostly CGA with cues for technique and hand placement - up to min A for transitions/turning during transfer on and off rollator due to balance/LLE pain. Pt ambulated about 30' and reports needing to sit down, turned rollator again the wall, locked brakes, and sat down but reports dizziness and blinking his eyes. Took BP (see below) which took awhile to get a read but was in line with his BP reading recorded earlier this AM. Pt unable to recall if he has been having issues with this but per therapy notes, sounds like he has. Noticed order for Teds which pt was not wearing any and states he hadn't been. Educated on purpose and agreeable. Donned the R but L he was unable to tolerate due to pain. Noted to have significant edema bilaterally L > R, also unable to state if it was new or not (did state he didn't have that prior to this admission). Will discuss with team. Educated on elevation. Pt continued with gait after reporting symptoms were ok, x 50' and then required seated break again. Difficulty with description of dizziness - some reports of spinning, some reports of vision change, and ringing in his ear (states he had a history of this). Focused on seated strengthening and endurance and ROM for LLE with w/c propulsion x 75' with BUE and BLE with focus on use of LLE for ROM and activation of musculature. Extra time needed for task. Transported back to room and notified nursing of request to use bathroom.  Medical interpreter  present during session.   Therapy Documentation Precautions:  Precautions Precautions: Knee, Fall Precaution Booklet Issued: Yes (comment) Recall of Precautions/Restrictions: Impaired Precaution/Restrictions Comments: no pillow under knee Restrictions Weight Bearing Restrictions Per Provider Order: Yes LLE Weight Bearing Per Provider Order: Weight bearing as tolerated    Vital Signs: 109/69 mmHg; HR = 102 bpm Unable to get reading in standing position  Pain: Premedicated for LLE pain. Pt reports he has been having headaches in the AM and forgot to tell the MD (notified her of this)    Therapy/Group: Individual Therapy  [name] Elnor, PT, DPT, CBIS  05/20/2024, 9:55 AM

## 2024-05-20 NOTE — Progress Notes (Signed)
 Occupational Therapy Session Note  Patient Details  Name: Brandon Clark MRN: [id number] Date of Birth: 25-Nov-1940  Today's Date: 05/20/2024 OT Individual Time: 9199-9154  1115-1130 OT Individual Time Calculation (min): 60 min     Skilled Therapeutic Interventions/Progress Updates: Patient agreeable to OT treatment. Interpreter present. Patient reports getting pain medication prior to treatment. Ambulated to bathroom SBA. Mod I for toileting. Patient sat on the toilet for a sponge bath reporting he'd feel better and would like a shower during his later treatment. Patient ambulated from bathroom to w/c for seated grooming tasks at the sink. Set up assist for G/H seated. Concluded treatment with walk from room to nurses station. Patient pushed back to his room in his w/c and set up for breakfast. Call bell and phone left within reach. Patient expressed understanding that he is not to get up without assist. Continue with skilled OT POC.    Second Treatment: Interpreter present. Patient received resting and reporting increased fatigue. Patient reporting 7/10 pain level; same as am treatment session. Patient not wanting to get OOB at this time. Patient educated on edema management through ankle pumps, elevation and LE muscle contractions. Patient able to return demo after verbal and visual directions. Patient reporting his is too fatigued at this time to continue with further treatment. Will continue with skilled OT POC working to build endurance and independence with functional transfers and self care.  Therapy Documentation Precautions:  Precautions Precautions: Knee, Fall Precaution Booklet Issued: Yes (comment) Recall of Precautions/Restrictions: Impaired Precaution/Restrictions Comments: no pillow under knee Restrictions Weight Bearing Restrictions Per Provider Order: Yes LLE Weight Bearing Per Provider Order: Weight bearing as tolerated General:   Vital Signs: Therapy  Vitals Temp: 97.8 F (36.6 C) Pulse Rate: 89 Resp: 17 BP: 111/66 Patient Position (if appropriate): Lying Oxygen Therapy SpO2: 94 % O2 Device: Room Air Pain: Pain Assessment Pain Scale: 0-10 Pain Score: 7  Pain Location: Head Pain Intervention(s): Medication (See eMAR)    Therapy/Group: Individual Therapy  Isaiah JONETTA Freund 05/20/2024, 11:39 AM

## 2024-05-20 NOTE — Progress Notes (Signed)
 Physical Therapy Session Note  Patient Details  Name: Brandon Clark MRN: [id number] Date of Birth: [birth year]  Today's Date: 05/20/2024 PT Individual Time: 8595-8541 PT Individual Time Calculation (min): 54 min   Short Term Goals: Week 1:  PT Short Term Goal 1 (Week 1): STG=LTG due to LOS  Skilled Therapeutic Interventions/Progress Updates:    Pt presents in bed. S to come to EOB. Applied ACE wrap to LLE for edema management - education provided on edema/BP management. Tedhose still on RLE. Min A for sit > stand with RW with cues for hand placement. Returned back to use of RW due to pt using rollator in the AM session with brakes locked and reporting it moved too fast. Pt continues with step to pattern with gait with RW, CGA to walk into bathroom and performed toileting with S in standing for balance. Gait x 50' with RW with step to pattern, denies dizziness but reports fatigue and requires seated rest break. Pt performed second bout of gait  for functional mobility training with RW x 20' with antalgic and step to pattern. Sit > stand with min to CGA with cues for hand placement and technique with NMR for toe taps with LLE to 4 step with RW for support with min A and noted to have RLE shaking - pt reporting his R knee also needs a replacement. Pt declines attempting with RLE. Rest break seated and performed with LLE again x 10 reps with again noted shaking/instability with R knee. Performed seated LAQ for ROM and strengthening x 10 reps each BLE - decreased range noted on L. Pt now reporting intermittent dizziness. Seated core strengthening with 5# dumbbell for trunk rotation and diagonal chops x 10 reps each direction. Gait x 25' with RW with same gait pattern as described above for functional mobility training and overall strengthening, limited due to fatigue, Returned to room via w/c total A and set up with all needs in reach. Safety belt donned.   Therapy Documentation Precautions:   Precautions Precautions: Knee, Fall Precaution Booklet Issued: Yes (comment) Recall of Precautions/Restrictions: Impaired Precaution/Restrictions Comments: no pillow under knee Restrictions Weight Bearing Restrictions Per Provider Order: Yes LLE Weight Bearing Per Provider Order: Weight bearing as tolerated   Pain:  Reports pain around 6-7/10 - premedicated.     Therapy/Group: Individual Therapy  [name] Elnor, PT, DPT, CBIS  05/20/2024, 3:01 PM

## 2024-05-20 NOTE — Progress Notes (Signed)
 PROGRESS NOTE   Subjective/Complaints: Dizziness with ambulation, will d/c flomax  Morning headaches and daytime fatigue, likely 2/2 sleep apnea, will order CPAP for tonight  ROS: +dizziness with ambulation    Objective:   No results found. No results for input(s): WBC, HGB, HCT, PLT in the last 72 hours.  No results for input(s): NA, K, CL, CO2, GLUCOSE, BUN, CREATININE, CALCIUM in the last 72 hours.   Intake/Output Summary (Last 24 hours) at 05/20/2024 1113 Last data filed at 05/20/2024 0857 Gross per 24 hour  Intake 300 ml  Output --  Net 300 ml         Physical Exam: Vital Signs Blood pressure 111/66, pulse 89, temperature 97.8 F (36.6 C), resp. rate 17, height 6' (1.829 m), weight 115.6 kg, SpO2 94%.  General: awake, alert, appropriate,  sitting up in bed after woke him up; NAD HENT: conjugate gaze; oropharynx very dry- gave water CV: regular rate and rhythm; no JVD Pulmonary: CTA B/L; no W/R/R- good air movement GI: soft, NT, ND, (+)BS- more normoactive Psychiatric: appropriate- less irritable today Neurological: Ox3   Neurologic: Cranial nerves II through XII intact, motor strength is 5/5 in bilateral deltoid, bicep, tricep, grip, right hip flexor, knee extensors, ankle dorsiflexor and plantar flexor 4- Left Hip flex, knee ext 4/5  L ankle PF/DF, stable 11/17   Musculoskeletal: Full range of motion in all 4 extremities. No joint swelling except at left knee, there is diffuse swelling in the left calf with ecchymosis.  Left knee incision is covered by postoperative dressing   Assessment/Plan: 1. Functional deficits which require 3+ hours per day of interdisciplinary therapy in a comprehensive inpatient rehab setting. Physiatrist is providing close team supervision and 24 hour management of active medical problems listed below. Physiatrist and rehab team continue to assess  barriers to discharge/monitor patient progress toward functional and medical goals  Care Tool:  Bathing    Body parts bathed by patient: Right arm, Left arm, Chest, Abdomen, Front perineal area, Buttocks, Face, Left upper leg, Right upper leg   Body parts bathed by helper: Right lower leg, Left lower leg (Pt unable to reach feet at this time d/t swelling in LLE.)     Bathing assist Assist Level: Minimal Assistance - Patient > 75%     Upper Body Dressing/Undressing Upper body dressing   What is the patient wearing?: Pull over shirt    Upper body assist Assist Level: Set up assist    Lower Body Dressing/Undressing Lower body dressing      What is the patient wearing?: Underwear/pull up, Pants     Lower body assist Assist for lower body dressing: Moderate Assistance - Patient 50 - 74%     Toileting Toileting    Toileting assist Assist for toileting: Minimal Assistance - Patient > 75%     Transfers Chair/bed transfer  Transfers assist     Chair/bed transfer assist level: Minimal Assistance - Patient > 75%     Locomotion Ambulation   Ambulation assist      Assist level: Contact Guard/Touching assist Assistive device: Rollator Max distance: 50'   Walk 10 feet activity   Assist  Assist level: Contact Guard/Touching assist Assistive device: Rollator   Walk 50 feet activity   Assist Walk 50 feet with 2 turns activity did not occur: Safety/medical concerns (pain, dizziness)  Assist level: Contact Guard/Touching assist Assistive device: Rollator    Walk 150 feet activity   Assist Walk 150 feet activity did not occur: Safety/medical concerns (pain, dizziness)         Walk 10 feet on uneven surface  activity   Assist Walk 10 feet on uneven surfaces activity did not occur: Safety/medical concerns (pain, dizziness)         Wheelchair     Assist Is the patient using a wheelchair?: Yes Type of Wheelchair: Manual    Wheelchair  assist level: Supervision/Verbal cueing Max wheelchair distance: 45ft    Wheelchair 50 feet with 2 turns activity    Assist        Assist Level: Supervision/Verbal cueing   Wheelchair 150 feet activity     Assist  Wheelchair 150 feet activity did not occur: Safety/medical concerns       Blood pressure 111/66, pulse 89, temperature 97.8 F (36.6 C), resp. rate 17, height 6' (1.829 m), weight 115.6 kg, SpO2 94%.  Medical Problem List and Plan: 1. 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  Weightbearing as tolerated             -patient may  shower with Left knee wrapped              -ELOS/Goals: 14-16d Sup/min A  Asked nursing to please remove left knee dressing and examine incision site  Grounds pass ordered  Con't CIR PT and OT 2.  Left peroneal DVT: continue Eliquis             -antiplatelet therapy: N/A  3. Pain Management:  increase prn percocet to 2 tabs q8H, added tramadol  prn, add oxycontin  20 mg q12H- added 11/13  11/15- pt reports pain is doing better when gets meds/is due this AM 4. Mood/Behavior/Sleep/dementia: Elavil 25 mg nightly, decrease namenda to HS since can cause leg swelling which he currently has, melatonin 5 mg nightly, Aricept  10 mg nightly trazodone  50 mg nightly as needed             -antipsychotic agents: N/A  5. Neuropsych/cognition: This patient is capable of making decisions on his own behalf. 6. Skin/Wound Care: Routine skin checks 7. Fluids/Electrolytes/Nutrition: Routine ins and outs with follow-up chemistries 8.  Hypertension.  HCTZ 12.5 mg daily.  Monitor with increased mobility  11/15-11/16-  BP controlled- con't regimen and monitor with activity 9.  BPH.  Proscar  5 mg daily, add flomax  after dinner  10.  Hyperlipidemia.  Zocor  11.  Prediabetes.  Hemoglobin A1c 5.9.  SSI 12.  OSA.  CPAP ordered HS  13.  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  Dietary follow-up, check  magnesium  level on Monday  11/15- BMI down to 34.56- is improving  14.  Constipation: Miralax changed to prn. Colace 100 mg twice daily, LBM 11/16, increase magnesium  oxide to 400mg  daily  15. LLE edema: decrease memantine to 5mg  HS, discussed ace bandaging with therapy  16. Dizziness: d/c flomax     LOS: 7 days A FACE TO FACE EVALUATION WAS PERFORMED  Brandon Clark Brandon Clark 05/20/2024, 11:13 AM

## 2024-05-20 NOTE — Progress Notes (Signed)
   05/20/24 2342  BiPAP/CPAP/SIPAP  Reason BIPAP/CPAP not in use Non-compliant (Pt refused at this time. No acute distress noted. RT will continue to be available as needed.)  BiPAP/CPAP /SiPAP Vitals  Pulse Rate 98  Resp 16  SpO2 96 %  Bilateral Breath Sounds Clear  MEWS Score/Color  MEWS Score 0  MEWS Score Color Landy

## 2024-05-21 LAB — BASIC METABOLIC PANEL WITH GFR
Anion gap: 10 (ref 5–15)
BUN: 15 mg/dL (ref 8–23)
CO2: 29 mmol/L (ref 22–32)
Calcium: 8.8 mg/dL — ABNORMAL LOW (ref 8.9–10.3)
Chloride: 98 mmol/L (ref 98–111)
Creatinine, Ser: 0.96 mg/dL (ref 0.61–1.24)
GFR, Estimated: >60 mL/min (ref >60)
Glucose, Bld: 159 mg/dL — ABNORMAL HIGH (ref 70–99)
Potassium: 3.7 mmol/L (ref 3.5–5.1)
Sodium: 137 mmol/L (ref 135–145)

## 2024-05-21 LAB — GLUCOSE, CAPILLARY
Glucose-Capillary: 124 mg/dL — ABNORMAL HIGH (ref 70–99)
Glucose-Capillary: 138 mg/dL — ABNORMAL HIGH (ref 70–99)
Glucose-Capillary: 120 mg/dL — ABNORMAL HIGH (ref 70–99)
Glucose-Capillary: 130 mg/dL — ABNORMAL HIGH (ref 70–99)

## 2024-05-21 LAB — CBC WITH DIFFERENTIAL/PLATELET
Abs Immature Granulocytes: 0.09 K/uL — ABNORMAL HIGH (ref 0.00–0.07)
Basophils Absolute: 0.1 K/uL (ref 0.0–0.1)
Basophils Relative: 2 %
Eosinophils Absolute: 0.2 K/uL (ref 0.0–0.5)
Eosinophils Relative: 3 %
HCT: 36.6 % — ABNORMAL LOW (ref 39.0–52.0)
Hemoglobin: 12.1 g/dL — ABNORMAL LOW (ref 13.0–17.0)
Immature Granulocytes: 2 %
Lymphocytes Relative: 31 %
Lymphs Abs: 1.7 K/uL (ref 0.7–4.0)
MCH: 28.8 pg (ref 26.0–34.0)
MCHC: 33.1 g/dL (ref 30.0–36.0)
MCV: 87.1 fL (ref 80.0–100.0)
Monocytes Absolute: 0.8 K/uL (ref 0.1–1.0)
Monocytes Relative: 15 %
Neutro Abs: 2.7 K/uL (ref 1.7–7.7)
Neutrophils Relative %: 47 %
Platelets: 475 K/uL — ABNORMAL HIGH (ref 150–400)
RBC: 4.2 MIL/uL — ABNORMAL LOW (ref 4.22–5.81)
RDW: 13.6 % (ref 11.5–15.5)
WBC: 5.5 K/uL (ref 4.0–10.5)
nRBC: 0 % (ref 0.0–0.2)

## 2024-05-21 LAB — MAGNESIUM: Magnesium: 2 mg/dL (ref 1.7–2.4)

## 2024-05-21 MED ORDER — MAGNESIUM CITRATE PO SOLN
1.0000 | Freq: Once | ORAL | Status: AC
  Administered 2024-05-21: 1 via ORAL
  Filled 2024-05-21: qty 296

## 2024-05-21 MED ORDER — DOCUSATE SODIUM 100 MG PO CAPS: 100.0000 mg | ORAL_CAPSULE | Freq: Two times a day (BID) | ORAL | Status: DC

## 2024-05-21 MED ORDER — POLYETHYLENE GLYCOL 3350 17 G PO PACK: 17.0000 g | PACK | Freq: Every day | ORAL | Status: DC | PRN

## 2024-05-21 NOTE — Progress Notes (Signed)
 Physical Therapy Session Note  Patient Details  Name: Brandon Clark MRN: [id number] Date of Birth: [birth year]  Today's Date: 05/21/2024 PT Individual Time: 9153-9058 and 1300-1333 PT Individual Time Calculation (min): 55 min and 33 min PT Missed Time: 12 minutes PT Missed Time Reason: Headache  Short Term Goals: Week 1:  PT Short Term Goal 1 (Week 1): STG=LTG due to LOS  Skilled Therapeutic Interventions/Progress Updates:   Treatment Session 1 Received pt sitting in WC, pt agreeable to PT treatment, and reported pain 5/10 in L knee (premedicated). Session with emphasis on functional mobility/transfers, generalized strengthening and endurance, dynamic standing balance/coordination, ambulation, and stair navigation. Pt transported to/from room in [facility] dependently for time management purposes.   Stood at staircase with min A and cues for hand placement and navigated 4 6in steps x 1 then 12 6in steps x 1 with L handrail and min A using a lateral stepping technique. Pt reporting increased pain with ace wrap on; removed for 2nd round of stair navigation and pt reported relief. MD arrived for morning rounds, then transported to dayroom. Pt performed all transfers with RW and CGA throughout session. Pt ambulated ~74ft with RW and CGA to warm up then performed seated BUE/BLE strengthening on Nustep at workload 3 for 12 minutes for a total of 545 steps with emphasis on cardiovascular endurance and L knee flexion ROM. Pt allowed therapist to progressively move to seat position 9 to allow for more L knee flexion - 0.3 miles, 44 SPM, and 1.5 METs. Pt ambulated additional 65ft with RW and CGA - limited by dizziness and returned to room. Concluded session with pt sitting in WC, needs within reach, and seatbelt alarm on. Provided pt with ice pack for L knee but pt reported pain was much less than at start of session.  Treatment Session 2 Received pt semi-reclined in bed, pt agreeable to PT  treatment, and reported pain 7/10 in L knee (premedicated) - pt also reporting headache. Session with emphasis on functional mobility/transfers, generalized strengthening and endurance, dynamic standing balance/coordination, and ambulation. Pt transferred semi-reclined<>sitting R EOB with HOB elevated and use of bedrails with supervision.   Pt performed all transfers with RW and CGA throughout session. Pt ambulated 38ft with RW and CGA - limited by fatigue. Discussed equipment for D/C and recommended WC for longer distances - pt will need 20x18 manual WC with elevating legrests - CSW notified. Pt ambulated additional 74ft with RW and CGA - limited by headache. Pt politely declined any further activity due to headache. Returned to room and concluded session with pt sitting in WC, needs within reach, and seatbelt alarm on. 12 minutes missed of skilled physical therapy due to headache.   Therapy Documentation Precautions:  Precautions Precautions: Knee, Fall Precaution Booklet Issued: Yes (comment) Recall of Precautions/Restrictions: Impaired Precaution/Restrictions Comments: no pillow under knee Restrictions Weight Bearing Restrictions Per Provider Order: Yes LLE Weight Bearing Per Provider Order: Weight bearing as tolerated  Therapy/Group: Individual Therapy Therisa HERO Zaunegger Therisa Stains PT, DPT 05/21/2024, 6:52 AM

## 2024-05-21 NOTE — Progress Notes (Signed)
 PROGRESS NOTE   Subjective/Complaints: RT note reviewed, patient refused CPAP last night, discussed with patient that sleep apnea can contribute to morning headaches and daytime somnolence  ROS:  -dizziness with ambulation has resolved    Objective:   No results found. No results for input(s): WBC, HGB, HCT, PLT in the last 72 hours.  No results for input(s): NA, K, CL, CO2, GLUCOSE, BUN, CREATININE, CALCIUM in the last 72 hours.   Intake/Output Summary (Last 24 hours) at 05/21/2024 0943 Last data filed at 05/20/2024 1519 Gross per 24 hour  Intake 240 ml  Output --  Net 240 ml         Physical Exam: Vital Signs Blood pressure 109/76, pulse 84, temperature 97.7 F (36.5 C), resp. rate 16, height 6' (1.829 m), weight 115.6 kg, SpO2 95%.  General: awake, alert, appropriate,  sitting up in bed after woke him up; NAD HENT: conjugate gaze; oropharynx very dry- gave water CV: regular rate and rhythm; no JVD Pulmonary: CTA B/L; no W/R/R- good air movement GI: soft, NT, ND, (+)BS- more normoactive Psychiatric: appropriate- less irritable today Neurological: Ox3   Neurologic: Cranial nerves II through XII intact, motor strength is 5/5 in bilateral deltoid, bicep, tricep, grip, right hip flexor, knee extensors, ankle dorsiflexor and plantar flexor 4- Left Hip flex, knee ext 4/5  L ankle PF/DF, stable 11/18   Musculoskeletal: Full range of motion in all 4 extremities. No joint swelling except at left knee, there is diffuse swelling in the left calf with ecchymosis.  Left knee incision is covered by postoperative dressing   Assessment/Plan: 1. Functional deficits which require 3+ hours per day of interdisciplinary therapy in a comprehensive inpatient rehab setting. Physiatrist is providing close team supervision and 24 hour management of active medical problems listed below. Physiatrist and rehab  team continue to assess barriers to discharge/monitor patient progress toward functional and medical goals  Care Tool:  Bathing    Body parts bathed by patient: Right arm, Left arm, Chest, Abdomen, Front perineal area, Buttocks, Face, Left upper leg, Right upper leg   Body parts bathed by helper: Right lower leg, Left lower leg (Pt unable to reach feet at this time d/t swelling in LLE.)     Bathing assist Assist Level: Set up assist     Upper Body Dressing/Undressing Upper body dressing   What is the patient wearing?: Pull over shirt    Upper body assist Assist Level: Set up assist    Lower Body Dressing/Undressing Lower body dressing      What is the patient wearing?: Pants, Underwear/pull up     Lower body assist Assist for lower body dressing: Minimal Assistance - Patient > 75%     Toileting Toileting    Toileting assist Assist for toileting: Contact Guard/Touching assist     Transfers Chair/bed transfer  Transfers assist     Chair/bed transfer assist level: Contact Guard/Touching assist     Locomotion Ambulation   Ambulation assist      Assist level: Contact Guard/Touching assist Assistive device: Rollator Max distance: 50'   Walk 10 feet activity   Assist     Assist level: Contact Guard/Touching assist  Assistive device: Rollator   Walk 50 feet activity   Assist Walk 50 feet with 2 turns activity did not occur: Safety/medical concerns (pain, dizziness)  Assist level: Contact Guard/Touching assist Assistive device: Rollator    Walk 150 feet activity   Assist Walk 150 feet activity did not occur: Safety/medical concerns (pain, dizziness)         Walk 10 feet on uneven surface  activity   Assist Walk 10 feet on uneven surfaces activity did not occur: Safety/medical concerns (pain, dizziness)         Wheelchair     Assist Is the patient using a wheelchair?: Yes Type of Wheelchair: Manual    Wheelchair assist level:  Supervision/Verbal cueing Max wheelchair distance: 16ft    Wheelchair 50 feet with 2 turns activity    Assist        Assist Level: Supervision/Verbal cueing   Wheelchair 150 feet activity     Assist  Wheelchair 150 feet activity did not occur: Safety/medical concerns       Blood pressure 109/76, pulse 84, temperature 97.7 F (36.5 C), resp. rate 16, height 6' (1.829 m), weight 115.6 kg, SpO2 95%.  Medical Problem List and Plan: 1. 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  Weightbearing as tolerated             -patient may  shower with Left knee wrapped              -ELOS/Goals: 14-16d Sup/min A  Asked nursing to please remove left knee dressing and examine incision site  Grounds pass ordered  Continue CIR PT and OT 2.  Left peroneal DVT: continue Eliquis, discussed it can take 3 months for clots to resorb             -antiplatelet therapy: N/A  3. Pain Management:  increase prn percocet to 2 tabs q8H, added tramadol  prn, add oxycontin  20 mg q12H- added 11/13  11/15- pt reports pain is doing better when gets meds/is due this AM 4. Mood/Behavior/Sleep/dementia: Elavil 25 mg nightly, decrease namenda to HS since can cause leg swelling which he currently has, melatonin 5 mg nightly, Aricept  10 mg nightly trazodone  50 mg nightly as needed             -antipsychotic agents: N/A  5. Neuropsych/cognition: This patient is capable of making decisions on his own behalf. 6. Skin/Wound Care: Routine skin checks 7. Fluids/Electrolytes/Nutrition: Routine ins and outs with follow-up chemistries 8.  Hypertension.  HCTZ 12.5 mg daily.  Monitor with increased mobility  11/15-11/16-  BP controlled- con't regimen and monitor with activity 9.  BPH.  Proscar  5 mg daily, add flomax  after dinner  10.  Hyperlipidemia.  Zocor  11.  Prediabetes.  Hemoglobin A1c 5.9.  SSI 12.  OSA.  CPAP ordered HS  13.  Class II obesity.   BMI 36.48.  Dietary follow-up, check magnesium  level on Monday  11/15- BMI down to 34.56- is improving  14.  Constipation: Miralax changed to prn. Colace 100 mg twice daily, LBM 11/16, increase magnesium  oxide to 400mg  daily  15. LLE edema: d/c namenda discussed ace bandaging with therapy  16. Dizziness: d/c flomax , resolved    LOS: 8 days A FACE TO FACE EVALUATION WAS PERFORMED  Brandon Clark Brandon Clark 05/21/2024, 9:43 AM

## 2024-05-21 NOTE — Progress Notes (Signed)
 Occupational Therapy Session Note  Patient Details  Name: Brandon Clark MRN: [id number] Date of Birth: 1941-04-05  Session 1 Today's Date: 05/21/2024 OT Individual Time: 9198-9153 OT Individual Time Calculation (min): 45 min  Session 2 Today's Date: 05/21/2024 OT Individual Time: 8879-8794 OT Individual Time Calculation (min): 45 min    Short Term Goals: Week 1:  OT Short Term Goal 1 (Week 1): STG=LTG d/t ELOS  Skilled Therapeutic Interventions/Progress Updates:  Session 1: Skilled OT session completed to address ADL retraining. Pt received supine in bed, agreeable to participate in therapy. Pt reports no pain.  Supine>EOB Mod-I with HOB elevated. Total A to don non-skid grip socks on BLE. Pt completed functional mobility with RW to bathroom with CGA. Toileting completed with supervision, pt standing intermittently during task to perform hygiene. Pt completed functional mobility to WC with CGA. Pt self-propels to sink to complete oral care and grooming with independence. Pt reporting increased swelling in LLE, RN and OT provided education on importance of compression, ice, and elevating LLE to reduce swelling. Pt growing increasingly frustrated with education, task discontinued. OT donned compression sock on RLE and ACE wrap on LLE dependently. Pt reporting no pain during task. Pt seated in WC, direct handoff to PT.  Session 2: Skilled OT session completed to address functional mobility and discharge planning. Pt received seated in WC, agreeable to participate in therapy. Pt reports no pain.   Pt requested to void, functional mobility completed with RW CGA. Pt required increased time toileting d/t reporting constipation. Nursing notified. Pt completed functional mobility to WC to complete hand hygiene. OT demo'd shower transfer in walk-in shower to support bathing in home environment. Shower chair with no back used for demo, this is pt's preference of DME. OT provided education  on importance of stepping over tub with this DME to support bathing and offered pt to return demo, pt declined. OT dependently propelled pt to main gym. WC>EOM stand pivot completed with Min A for RW safety. Pt completed 5x STS to support transfers in home environment as pt reports low seating in home. STS completed with Min A to reach standing. Pt required seated rest break. OT instructed pt on marching 30secx2 to support tub transfers in home environment. OT provided 3inch cone to step over to grade task up, pt grows frustrated with task on LLE and discontinues. OT provides education on importance of this movement to support task, pt reports fatigue and requests rest break. EOM>WC stand pivot CGA with RW. Pt dependently transffered back to room. WC>EOB with CGA, OT provided tactile cues for RW safety. EOB>Supine with Min A to lift BLE in bed. OT provided ice to reduce swelling in LLE. Pt supine in bed with bed alarm on and all needs within reach.  Therapy Documentation Precautions:  Precautions Precautions: Knee, Fall Precaution Booklet Issued: Yes (comment) Recall of Precautions/Restrictions: Impaired Precaution/Restrictions Comments: no pillow under knee Restrictions Weight Bearing Restrictions Per Provider Order: Yes LLE Weight Bearing Per Provider Order: Weight bearing as tolerated    Therapy/Group: Individual Therapy  Areli Jowett Woods-Chance, MS, OTR/L 05/21/2024, 7:49 AM

## 2024-05-21 NOTE — Progress Notes (Signed)
 Patient ID: Brandon Clark, male   DOB: [birth year], 83 y.o.   MRN: [id number]  Provided an update to [facility] regarding patient's discharge. The family is still on board for 11/21 discharge. Further information to be provided regarding maneuvering through the home and safety.

## 2024-05-21 NOTE — Progress Notes (Addendum)
 Patient ID: Brandon Clark, male   DOB: [birth year], 83 y.o.   MRN: [id number]  Spoke with [employer] [employer] patient's workers comp case worker - sent her order for OP Therapy that will be at [facility] on [address], [place], [place] [address] for PT/OT. Already has a 3-1 commode that was delivered to the home. SW also sent over clinicals to [email or web address]

## 2024-05-22 LAB — GLUCOSE, CAPILLARY
Glucose-Capillary: 120 mg/dL — ABNORMAL HIGH (ref 70–99)
Glucose-Capillary: 127 mg/dL — ABNORMAL HIGH (ref 70–99)
Glucose-Capillary: 137 mg/dL — ABNORMAL HIGH (ref 70–99)
Glucose-Capillary: 110 mg/dL — ABNORMAL HIGH (ref 70–99)

## 2024-05-22 MED ORDER — MAGNESIUM CITRATE PO SOLN
1.0000 | Freq: Every day | ORAL | Status: DC
  Administered 2024-05-22 – 2024-05-23 (×2): 1 via ORAL
  Filled 2024-05-22 (×2): qty 296

## 2024-05-22 MED ORDER — AMITRIPTYLINE HCL 10 MG PO TABS
10.0000 mg | ORAL_TABLET | Freq: Every day | ORAL | Status: DC
  Administered 2024-05-22: 10 mg via ORAL
  Filled 2024-05-22 (×2): qty 1

## 2024-05-22 MED ORDER — FUROSEMIDE 20 MG PO TABS
10.0000 mg | ORAL_TABLET | Freq: Every day | ORAL | Status: DC
  Administered 2024-05-22 – 2024-05-24 (×3): 10 mg via ORAL
  Filled 2024-05-22 (×3): qty 1

## 2024-05-22 NOTE — Progress Notes (Signed)
 Physical Therapy Discharge Summary  Patient Details  Name: Brandon Clark MRN: [id number] Date of Birth: [birth year]  Date of Discharge from PT service:May 23, 2024  Patient has met 4 of 8 long term goals due to improved activity tolerance, improved balance, improved postural control, increased strength, increased range of motion, decreased pain, ability to compensate for deficits, improved awareness, and improved coordination.  Patient to discharge at a wheelchair level Supervision with RW. Patient's care partner is independent to provide the necessary physical and cognitive assistance at discharge.  Reasons goals not met: Pt did not meet goals at supervision level for car transfer, home and controlled environment gait, and stair negotiation. Pt requires CGA for these tasks intermittently due to balance, pain, and dizziness. Gait distances are also limited to about 64' due to the above (goals for 150 and 75')  Recommendation:  Patient will benefit from ongoing skilled PT services in home health setting to continue to advance safe functional mobility, address ongoing impairments in transfers, generalized strengthening and endurance, ROM, dynamic standing balance/coordination, ambulation, stair navigation, and minimize fall risk.  Equipment: 20x18 manual WC with bilateral elevating legrests  Reasons for discharge: treatment goals met  Patient/family agrees with progress made and goals achieved: Yes  PT Discharge Precautions/Restrictions Precautions Precautions: Knee;Fall Recall of Precautions/Restrictions: Impaired Precaution/Restrictions Comments: no pillow under knee Restrictions Weight Bearing Restrictions Per Provider Order: No LLE Weight Bearing Per Provider Order: Weight bearing as tolerated Pain Interference Pain Interference Pain Effect on Sleep: 3. Frequently Pain Interference with Therapy Activities: 1. Rarely or not at all Pain Interference with Day-to-Day  Activities: 2. Occasionally Cognition Overall Cognitive Status: History of cognitive impairments - at baseline Arousal/Alertness: Awake/alert Orientation Level: Oriented X4 Memory: Impaired Awareness: Impaired Problem Solving: Impaired Safety/Judgment: Appears intact Sensation Sensation Light Touch: Appears Intact Hot/Cold: Not tested Proprioception: Appears Intact Stereognosis: Not tested Coordination Gross Motor Movements are Fluid and Coordinated: No (not in LEs) Fine Motor Movements are Fluid and Coordinated: Yes Coordination and Movement Description: limited by pain, weakness, and decreased L knee ROM Heel Shin Test: not tested Motor  Motor Motor: Other (comment) Motor - Discharge Observations: decreased WB/ROM to the LLE which impacts overall mobility, decreased muscular endurance/activity tolerance and strength  Mobility Bed Mobility Bed Mobility: Rolling Right;Rolling Left;Sit to Supine;Supine to Sit Rolling Right: Independent with assistive device Rolling Left: Independent with assistive device Supine to Sit: Independent with assistive device Sit to Supine: Independent with assistive device Transfers Transfers: Sit to Stand;Stand to Sit;Stand Pivot Transfers Sit to Stand: Supervision/Verbal cueing Stand to Sit: Supervision/Verbal cueing Stand Pivot Transfers: Supervision/Verbal cueing Transfer (Assistive device): Rolling walker Locomotion  Gait Ambulation: Yes Gait Assistance: Contact Guard/Touching assist Gait Distance (Feet): 50 Feet Assistive device: Rolling walker Gait Gait Pattern: Impaired Gait Pattern: Step-to pattern;Decreased step length - right;Decreased step length - left;Decreased stance time - left;Decreased stride length;Decreased dorsiflexion - left;Decreased weight shift to left;Antalgic;Trunk flexed;Narrow base of support;Poor foot clearance - left;Poor foot clearance - right Stairs / Additional Locomotion Stairs: Yes Stairs Assistance:  Contact Guard/Touching assist Stair Management Technique: One rail Left;Step to pattern;Sideways Number of Stairs: 4 Height of Stairs: 6 Ramp: Supervision/Verbal cueing Pick up small object from the floor assist level: Supervision/Verbal cueing Pick up small object from the floor assistive device: Chief Operating Officer Mobility: Yes Wheelchair Assistance: Independent with Scientist, Research (life Sciences): Both upper extremities Wheelchair Parts Management: Needs assistance Distance: 150'  Trunk/Postural Assessment  Cervical Assessment Cervical Assessment: Exceptions to [facility] (mild forward head) Thoracic  Assessment Thoracic Assessment: Exceptions to [facility] (thoracic rounding) Lumbar Assessment Lumbar Assessment: Exceptions to [facility] (posterior pelvic tilt) Postural Control Postural Control: Within Functional Limits  Balance Balance Balance Assessed: Yes Standardized Balance Assessment Standardized Balance Assessment: Timed Up and Go Test Timed Up and Go Test TUG: Normal TUG Normal TUG (seconds): 76 (with RW) Static Sitting Balance Static Sitting - Balance Support: Feet supported;Bilateral upper extremity supported Static Sitting - Level of Assistance: 7: Independent Dynamic Sitting Balance Dynamic Sitting - Balance Support: Feet supported;No upper extremity supported Dynamic Sitting - Level of Assistance: 6: Modified independent (Device/Increase time) Static Standing Balance Static Standing - Balance Support: Bilateral upper extremity supported;During functional activity (RW) Static Standing - Level of Assistance: 5: Stand by assistance (supervision) Dynamic Standing Balance Dynamic Standing - Balance Support: Bilateral upper extremity supported;During functional activity (RW) Dynamic Standing - Level of Assistance: 5: Stand by assistance (supervision) Dynamic Standing - Comments: during functional activity Extremity Assessment  RLE Assessment RLE  Assessment: Exceptions to [facility] Active Range of Motion (AROM) Comments: decreased AROM ankle due to edema General Strength Comments: grossly 3+/5 LLE Assessment Active Range of Motion (AROM) Comments: decreased knee and ankle ROM due to edema and pain LLE Strength LLE Overall Strength Comments: 3-5/ overall; edema and pain limiting full ROM and strength   Therisa HERO Zaunegger Therisa Stains PT, DPT 05/22/2024, 3:05 PM  Donald [name] Pereyra, PT, DPT, CBIS 05/23/24 11:18 AM

## 2024-05-22 NOTE — Progress Notes (Signed)
 Physical Therapy Note  Patient Details  Name: Brandon Clark MRN: [id number] Date of Birth: 24-Apr-1941 Today's Date: 05/22/2024  Physical Therapist participated in the interdisciplinary team conference, providing clinical information regarding the patient's current status, treatment goals, and weekly focus, including any barriers that need to be addressed. Please see the Inpatient Rehabilitation Team Conference and Plan of Care Update for further details.      Therisa HERO Zaunegger Therisa Stains PT, DPT 05/22/2024, 11:35 AM

## 2024-05-22 NOTE — Progress Notes (Signed)
 PROGRESS NOTE   Subjective/Complaints: Continues to complain of LLE swelling, discussed again presence of clots and that continued swelling is likely for some time Appreciates magnesium  citrate and would like daily  ROS:  -dizziness with ambulation has resolved, constipation improved    Objective:   No results found. Recent Labs    05/21/24 1037  WBC 5.5  HGB 12.1*  HCT 36.6*  PLT 475*    Recent Labs    05/21/24 1037  NA 137  K 3.7  CL 98  CO2 29  GLUCOSE 159*  BUN 15  CREATININE 0.96  CALCIUM 8.8*     Intake/Output Summary (Last 24 hours) at 05/22/2024 1027 Last data filed at 05/21/2024 1825 Gross per 24 hour  Intake 240 ml  Output --  Net 240 ml         Physical Exam: Vital Signs Blood pressure 113/73, pulse 86, temperature 97.7 F (36.5 C), resp. rate 18, height 6' (1.829 m), weight 117.5 kg, SpO2 92%.  General: awake, alert, appropriate,  sitting up in bed after woke him up; NAD HENT: conjugate gaze; oropharynx very dry- gave water CV: regular rate and rhythm; no JVD Pulmonary: CTA B/L; no W/R/R- good air movement GI: soft, NT, ND, (+)BS- more normoactive Psychiatric: appropriate- less irritable today Neurological: Ox3   Neurologic: Cranial nerves II through XII intact, motor strength is 5/5 in bilateral deltoid, bicep, tricep, grip, right hip flexor, knee extensors, ankle dorsiflexor and plantar flexor 4/5 Left Hip flex, knee ext 4/5  L ankle PF/DF, improved as above 11/19   Musculoskeletal: Full range of motion in all 4 extremities. No joint swelling except at left knee, there is diffuse swelling in the left calf with ecchymosis.  Left knee incision is covered by postoperative dressing   Assessment/Plan: 1. Functional deficits which require 3+ hours per day of interdisciplinary therapy in a comprehensive inpatient rehab setting. Physiatrist is providing close team supervision and 24  hour management of active medical problems listed below. Physiatrist and rehab team continue to assess barriers to discharge/monitor patient progress toward functional and medical goals  Care Tool:  Bathing    Body parts bathed by patient: Right arm, Left arm, Chest, Abdomen, Front perineal area, Buttocks, Face, Left upper leg, Right upper leg   Body parts bathed by helper: Right lower leg, Left lower leg (Pt unable to reach feet at this time d/t swelling in LLE.)     Bathing assist Assist Level: Set up assist     Upper Body Dressing/Undressing Upper body dressing   What is the patient wearing?: Pull over shirt    Upper body assist Assist Level: Set up assist    Lower Body Dressing/Undressing Lower body dressing      What is the patient wearing?: Pants, Underwear/pull up     Lower body assist Assist for lower body dressing: Minimal Assistance - Patient > 75%     Toileting Toileting    Toileting assist Assist for toileting: Contact Guard/Touching assist     Transfers Chair/bed transfer  Transfers assist     Chair/bed transfer assist level: Contact Guard/Touching assist     Locomotion Ambulation   Ambulation assist  Assist level: Supervision/Verbal cueing Assistive device: Hand held assist Max distance: 26ft   Walk 10 feet activity   Assist     Assist level: Supervision/Verbal cueing Assistive device: Walker-rolling   Walk 50 feet activity   Assist Walk 50 feet with 2 turns activity did not occur: Safety/medical concerns (pain, dizziness)  Assist level: Supervision/Verbal cueing Assistive device: Walker-rolling    Walk 150 feet activity   Assist Walk 150 feet activity did not occur: Safety/medical concerns (pain, dizziness)         Walk 10 feet on uneven surface  activity   Assist Walk 10 feet on uneven surfaces activity did not occur: Safety/medical concerns (pain, dizziness)         Wheelchair     Assist Is the  patient using a wheelchair?: Yes Type of Wheelchair: Manual    Wheelchair assist level: Supervision/Verbal cueing Max wheelchair distance: 271ft    Wheelchair 50 feet with 2 turns activity    Assist        Assist Level: Supervision/Verbal cueing   Wheelchair 150 feet activity     Assist  Wheelchair 150 feet activity did not occur: Safety/medical concerns   Assist Level: Supervision/Verbal cueing   Blood pressure 113/73, pulse 86, temperature 97.7 F (36.5 C), resp. rate 18, height 6' (1.829 m), weight 117.5 kg, SpO2 92%.  Medical Problem List and Plan: 1. 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  Weightbearing as tolerated             -patient may  shower with Left knee wrapped              -ELOS/Goals: 14-16d Sup/min A  Asked nursing to please remove left knee dressing and examine incision site  Grounds pass ordered  Continue CIR PT and OT 2.  Left peroneal DVT: continue Eliquis , discussed it can take 3 months for clots to resorb             -antiplatelet therapy: N/A  3. Pain Management:  increase prn percocet to 2 tabs q8H, added tramadol  prn, add oxycontin  20 mg q12H- added 11/13  11/15- pt reports pain is doing better when gets meds/is due this AM 4. Mood/Behavior/Sleep/dementia: Elavil  25 mg nightly, decrease namenda  to HS since can cause leg swelling which he currently has, melatonin 5 mg nightly, Aricept  10 mg nightly trazodone  50 mg nightly as needed             -antipsychotic agents: N/A  5. Neuropsych/cognition: This patient is capable of making decisions on his own behalf. 6. Skin/Wound Care: Routine skin checks 7. Fluids/Electrolytes/Nutrition: Routine ins and outs with follow-up chemistries 8.  Hypertension.  HCTZ 12.5 mg daily.  Monitor with increased mobility  11/15-11/16-  BP controlled- con't regimen and monitor with activity 9.  BPH.  Proscar  5 mg daily, add flomax  after  dinner  10.  Hyperlipidemia.  Zocor  11.  Prediabetes.  Hemoglobin A1c 5.9.  SSI 12.  OSA.  CPAP ordered HS  13.  Class II obesity.  BMI 36.48-->25.13, d/c feeding supplements which he is already not using  14.  Constipation: mag citrate daily ordered as per patient's request  15. LLE edema: d/c namenda  discussed ace bandaging with therapy, decrease amitritpyline to 10mg   16. Dizziness: d/c flomax , decrease amitriptyline  to 10mg , resolved    LOS: 9 days A FACE TO FACE EVALUATION WAS PERFORMED  Brandon Clark 05/22/2024, 10:27 AM

## 2024-05-22 NOTE — Patient Care Conference (Signed)
 Inpatient RehabilitationTeam Conference and Plan of Care Update Date: 05/22/2024   Time: 11:38 AM    Patient Name: Brandon Clark      Medical Record Number: [id number]  Date of Birth: December 27, 1940 Sex: Male         Room/Bed: 4M12C/4M12C-01 Payor Info: Payor: [employer] / Plan: [employer] / Product Type: *No Product type* /    Admit Date/Time:  05/13/2024  3:34 PM  Primary Diagnosis:  Debility  Hospital Problems: Principal Problem:   Debility Active Problems:   Pulmonary emboli [facility])    Expected Discharge Date: Expected Discharge Date: 05/24/24  Team Members Present: Physician leading conference: Dr. Sven Elks Social Worker Present: Waverly Gentry, LCSW-A Nurse Present: Barnie Ronde, RN PT Present: Therisa Stains, PT OT Present: Vera Pop, OT SLP Present: Rosina Downy, SLP PPS Coordinator present : Eleanor Colon, SLP     Current Status/Progress Goal Weekly Team Focus  Bowel/Bladder   continent of bowel and bladder   remian continent   attend to patient's bowel and bladder needs    Swallow/Nutrition/ Hydration               ADL's   UB Mod-I, LB CGA, toileting supervision (safety)   Mod-I   barriers: pain, weakness/deconditionning, decreased understanding of deficts, safety    Mobility   bed mobility CGA, transfers with RW/rollator CGA/min A, gait 83ft with rollator/RW CGA/min A, 4 6in steps with 2 handrails CGA/min A   Mod I/supervision  barriers: pain, weakness/deconditioning, SOB, dizziness, decreased L knee ROM    Communication                Safety/Cognition/ Behavioral Observations               Pain   left lower extremity pain, 7/10   paon free   medications and deep breathing exercises    Skin   clean and dry   free from infections  wound care, turning and positioning evevry 2 hours      Discharge Planning:  Will discharge home with his son and family - he [place] the [place].  [place] with workers comp has ordered all DME: 20x18 wheelchair - has 3-1 commode and RW already in the home. They will set up follow-up therapy. Recommended for OP PT/OT.   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 Noted dizziness without orthostasis, and poor carry over with decreased awareness of deficits  Patient on target to meet rehab goals: yes, currently needs supervision for bed mobility. Transfers with CGA - supervision using a RW.  Needs CGA for ambulation up to 13' and manages 8 steps with 1 railing with CA. Completes lower body care with supervision and is mod I for upper body care.  *See Care Plan and progress notes for long and short-term goals.   Revisions to Treatment Plan:  MD adjusted medications; wean Namenda and decreased Elavil dosage  Teaching Needs: Safety, medications, transfers, toileting, etc.   Current Barriers to Discharge: Decreased caregiver support and Home enviroment access/layout  Possible Resolutions to Barriers: Family education OP follow up services; [facility] DME: wheelchair     Medical Summary Current Status: LLE edema, constipation, pain  Barriers to Discharge: Medical stability  Barriers to Discharge Comments: LLE edema, constipation, pain Possible Resolutions to [employer] Focus: continue ace bandaging, weaned off namenda, decreased amitriptyline to  10mg , contiinue Eliquis, educated regarding duration, continue magnesium  citrate, continue oxycodone  and oxycontin    Continued Need for Acute Rehabilitation Level of Care: The patient requires daily medical management by a physician with specialized training in physical medicine and rehabilitation for the following reasons: Direction of a multidisciplinary physical rehabilitation program to maximize functional independence : Yes Medical management of patient stability for  increased activity during participation in an intensive rehabilitation regime.: Yes Analysis of laboratory values and/or radiology reports with any subsequent need for medication adjustment and/or medical intervention. : Yes   I attest that I was present, lead the team conference, and concur with the assessment and plan of the team.   Fredericka Sober B 05/22/2024, 2:31 PM

## 2024-05-22 NOTE — Plan of Care (Signed)
  Problem: RH Bathing Goal: LTG Patient will bathe all body parts with assist levels (OT) Description: LTG: Patient will bathe all body parts with assist levels (OT) 05/22/2024 1559 by Woods-Chance, Chanee Henrickson T, OT Flowsheets (Taken 05/22/2024 1559) LTG: Pt will perform bathing with assistance level/cueing: (downgraded d/t safety awareness and RW mgmt) -- Note: Goal downgraded d/t limited safety awareness and RW mgmt  05/22/2024 1558 by Woods-Chance, Noeli Lavery T, OT Flowsheets (Taken 05/22/2024 1558) LTG: Pt will perform bathing with assistance level/cueing: Supervision/Verbal cueing   Problem: RH Toilet Transfers Goal: LTG Patient will perform toilet transfers w/assist (OT) Description: LTG: Patient will perform toilet transfers with assist, with/without cues using equipment (OT) 05/22/2024 1559 by Woods-Chance, Antoria Lanza T, OT Flowsheets (Taken 05/22/2024 1559) LTG: Pt will perform toilet transfers with assistance level of: (downgraded d/t safety awareness and RW mgmt) -- Note: Goal downgraded d/t limited safety awareness and RW mgmt 05/22/2024 1558 by Woods-Chance, Quest Tavenner T, OT Flowsheets (Taken 05/22/2024 1558) LTG: Pt will perform toilet transfers with assistance level of: Supervision/Verbal cueing   Problem: RH Tub/Shower Transfers Goal: LTG Patient will perform tub/shower transfers w/assist (OT) Description: LTG: Patient will perform tub/shower transfers with assist, with/without cues using equipment (OT) 05/22/2024 1559 by Woods-Chance, Brenly Trawick T, OT Flowsheets (Taken 05/22/2024 1559) LTG: Pt will perform tub/shower stall transfers with assistance level of: (Downgraded d/t safety awareness and RW mgmt) -- Note: Goal downgraded d/t limited safety awareness and RW mgmt 05/22/2024 1558 by Woods-Chance, Delshawn Stech T, OT Flowsheets (Taken 05/22/2024 1558) LTG: Pt will perform tub/shower stall transfers with assistance level of: Supervision/Verbal cueing

## 2024-05-22 NOTE — Progress Notes (Signed)
 Occupational Therapy Session Note  Patient Details  Name: ANASTACIO BUA MRN: [id number] Date of Birth: 1940-08-10  Today's Date: 05/22/2024 OT Individual Time: 9054-8972 OT Individual Time Calculation (min): 42 min    Short Term Goals: Week 1:  OT Short Term Goal 1 (Week 1): STG=LTG d/t ELOS   Skilled Therapeutic Interventions/Progress Updates:    Pt up in wheelchair at time of session, using virtual translator initially and in person translator present later in session as well. Pt performing sink level oral hygiene seated, encouraged standing but pt stating I need to use the wheelchair. Pt performing dynamic standing balance activities in room at RW, for BUE support, encouraging standing balance to adjust to weight bearing through LLE. Pt performing short distance mobility with RW and wheelchair follow, approx 20 feet with pt feeling dizzy and needing to sit - checked BP and 122/75 with HR 85. Pt back in room, alarm on call bell in reach. Pt wanting to use bathroom in about 10 mins (nursing aware) after dizziness subsides.   Therapy Documentation Precautions:  Precautions Precautions: Knee, Fall Precaution Booklet Issued: Yes (comment) Recall of Precautions/Restrictions: Impaired Precaution/Restrictions Comments: no pillow under knee Restrictions Weight Bearing Restrictions Per Provider Order: Yes LLE Weight Bearing Per Provider Order: Weight bearing as tolerated    Therapy/Group: Individual Therapy  Chiquita JAYSON Hopping 05/22/2024, 8:29 AM

## 2024-05-22 NOTE — Progress Notes (Addendum)
 Patient ID: Brandon Clark, male   DOB: [birth year], 83 y.o.   MRN: [id number]  Have reviewed team conference with pt and family. Both aware and agreeable with targeted d/c date of 11/21 and goals of Independent with assistive device.   Per workers it sales professional, [employer]  20 x 18 manual wheelchair has been ordered via [employer]. Requested to deliver to CIR by 05/24/24, but if not, they will deliver to the client's home. [facility] recently supplied the client with RW with 5 in wheels, SPC, 3 in 1 commode, and cold therapy unit which were previously delivered to client's home prior to knee replacement.   Physical therapy/OT has been scheduled for 05/28/24 at 2:40 PM at [facility] [employer] [address], [place], [place] [address], [place] [phone]  Follow up with Dr. Dalldorf has been scheduled for 06/03/24 at 10:00 AM   [name] is aware. She was encouraged to make contact at any point with questions or concerns.   UPDATE:   [name] was made aware of the OT recommending a TTB for safe bathing.

## 2024-05-22 NOTE — Progress Notes (Signed)
 Physical Therapy Session Note  Patient Details  Name: Brandon Clark MRN: [id number] Date of Birth: 25-Mar-1941  Today's Date: 05/22/2024 PT Individual Time: [phone] and 1101-1125 PT Individual Time Calculation (min): 69 min and 24 min  Short Term Goals: Week 1:  PT Short Term Goal 1 (Week 1): STG=LTG due to LOS  Skilled Therapeutic Interventions/Progress Updates:   Treatment Session 1 Received pt semi-reclined in bed with RN administering medication. Pt agreeable to PT treatment and reported pain 7/10 in L knee. Session with emphasis on functional mobility/transfers, generalized strengthening and endurance, dynamic standing balance/coordination, and ambulation. Noted sigificant edema in LLE - pt refusing to allow therapist to don compression sock or ace wrap stating the doctor said no - secure chatted MD to confirm and MD suggested trying ace wrap again. Pt finally agreed to let therapist don ace wrap but requested RN change dressing first. RN present to change dressing, then ace wrapped LLE.  Pt transferred semi-reclined<>sitting EOB with HOB elevated and use of bedrails and supervision. Pt performed all transfers with RW and CGA throughout session. Pt ambulated 25ft with RW and CGA - limited by fatigue. Pt performed WC mobility ~223ft using BUE and supervision with ++ time and emphasis on BUE strength/coordination. Pt requesting to be weighed - stepped on/off scale with RW and min A (LOB when stepping down) and pt 259lbs - RN notified. Pt ambulated additional 57ft with RW and CGA fading to close supervision - of note, pt able to ambulate further when given target.  Attempted standing gastroc stretch on wedge, however pt unable to understand cues/technique and demo increased LOB, therefore stepped off. Returned to room and concluded session with pt sitting in WC, needs within reach, and seatbelt alarm on.   Treatment Session 2 Received pt sitting in WC, pt agreeable to PT  treatment, and did not state pain during session. Session with emphasis on functional mobility/transfers, generalized strengthening and endurance, dynamic standing balance/coordination, stair navigation, and ambulation. Pt transported to/from room in [facility] dependently for time management purposes. Stood at staircase with CGA and navigated 4 6in steps with L handrail and CGA x 3 trials using a lateral stepping technique. Pt required seated rest breaks in between trials due to fatigue. Stood with RW and CGA and ambulated 61ft with RW and close supervision back towards room. Pt reported urge to toilet; therapist offered to assist but pt very specific and particular with requests therefore handed off care to RN due to time restrictions.   Therapy Documentation Precautions:  Precautions Precautions: Knee, Fall Precaution Booklet Issued: Yes (comment) Recall of Precautions/Restrictions: Impaired Precaution/Restrictions Comments: no pillow under knee Restrictions Weight Bearing Restrictions Per Provider Order: Yes LLE Weight Bearing Per Provider Order: Weight bearing as tolerated  Therapy/Group: Individual Therapy Therisa HERO Zaunegger Therisa Stains PT, DPT 05/22/2024, 6:53 AM

## 2024-05-22 NOTE — Progress Notes (Signed)
   05/21/24 2040  BiPAP/CPAP/SIPAP  Reason BIPAP/CPAP not in use Non-compliant (pt refused at this time.)  BiPAP/CPAP /SiPAP Vitals  Pulse Rate 98  Resp 18  SpO2 95 %  Bilateral Breath Sounds Clear;Diminished

## 2024-05-22 NOTE — Progress Notes (Signed)
 Occupational Therapy Note  Patient Details  Name: Brandon Clark MRN: [id number] Date of Birth: 1941/01/13    Occupational Therapist participated in the interdisciplinary team conference, providing clinical information regarding the patient's current status, treatment goals, and weekly focus, including any barriers that need to be addressed. Please see the Inpatient Rehabilitation Team Conference and Plan of Care Update for further details.  Kaly Mcquary Woods-Chance, MS, OTR/L 05/22/2024, 5:32 PM

## 2024-05-22 NOTE — Progress Notes (Signed)
 Occupational Therapy Discharge Summary  Patient Details  Name: Brandon Clark MRN: [id number] Date of Birth: November 01, 1940  Date of Discharge from OT service:{Time; dates multiple:304500300}  {CHL IP REHAB OT TIME CALCULATIONS:304400400}   Patient has met {NUMBERS 0-12:18577} of {NUMBERS 0-12:18577} long term goals due to {due un:6958348}.  Patient to discharge at overall {LOA:3049010} level.  Patient's care partner {care partner:3041650} to provide the necessary {assistance:3041652} assistance at discharge.    Reasons goals not met: ***  Recommendation:  Patient will benefit from ongoing skilled OT services in {setting:3041680} to continue to advance functional skills in the area of {ADL/iADL:3041649}.  Equipment: {equipment:3041657}  Reasons for discharge: {Reason for discharge:3049018}  Patient/family agrees with progress made and goals achieved: {Pt/Family agree with progress/goals:3049020}  OT Discharge Precautions/Restrictions  Precautions Precautions: Knee;Fall Recall of Precautions/Restrictions: Impaired Precaution/Restrictions Comments: no pillow under knee Restrictions Weight Bearing Restrictions Per Provider Order: No LLE Weight Bearing Per Provider Order: Weight bearing as tolerated General   Vital Signs Therapy Vitals Temp: 98.4 F (36.9 C) Temp Source: Oral Pulse Rate: 89 Resp: 18 BP: 101/76 Patient Position (if appropriate): Sitting Oxygen Therapy SpO2: 94 % O2 Device: Room Air Pain   ADL ADL Eating: Set up Where Assessed-Eating: Edge of bed Grooming: Minimal assistance Where Assessed-Grooming: Edge of bed Upper Body Bathing: Minimal assistance Where Assessed-Upper Body Bathing: Edge of bed Lower Body Bathing: Moderate assistance Where Assessed-Lower Body Bathing: Edge of bed Upper Body Dressing: Setup Where Assessed-Upper Body Dressing: Edge of bed Lower Body Dressing: Moderate assistance Where Assessed-Lower Body Dressing: Edge of  bed Toileting: Minimal assistance Where Assessed-Toileting: Teacher, Adult Education: Curator Method: Proofreader: It Sales Professional: Information systems manager with back Film/video Editor: Insurance Underwriter Method: Designer, Industrial/product: Information systems manager with back Presenter, Broadcasting Overall Cognitive Status: History of cognitive impairments - at baseline Arousal/Alertness: Awake/alert Memory: Impaired Awareness: Impaired Problem Solving: Impaired Safety/Judgment: Appears intact Sensation   Motor  Motor Motor: Within Functional Limits Mobility  Bed Mobility Bed Mobility: Rolling Right;Rolling Left;Sit to Supine;Supine to Sit Rolling Right: Independent with assistive device Rolling Left: Independent with assistive device Supine to Sit: Independent with assistive device Sit to Supine: Independent with assistive device Transfers Sit to Stand: Supervision/Verbal cueing Stand to Sit: Supervision/Verbal cueing  Trunk/Postural Assessment  Cervical Assessment Cervical Assessment: Exceptions to [facility] (mild forward head) Thoracic Assessment Thoracic Assessment: Exceptions to [facility] (thoracic rounding) Lumbar Assessment Lumbar Assessment: Exceptions to [facility] (posterior pelvic tilt) Postural Control Postural Control: Within Functional Limits  Balance Balance Balance Assessed: Yes Static Sitting Balance Static Sitting - Balance Support: Feet supported;Bilateral upper extremity supported Static Sitting - Level of Assistance: 7: Independent Dynamic Sitting Balance Dynamic Sitting - Balance Support: Feet supported;No upper extremity supported Dynamic Sitting - Level of Assistance: 6: Modified independent (Device/Increase time) Static Standing Balance Static Standing - Balance Support: Bilateral upper extremity supported;During functional activity  (RW) Static Standing - Level of Assistance: 5: Stand by assistance (supervision) Dynamic Standing Balance Dynamic Standing - Balance Support: Bilateral upper extremity supported;During functional activity (RW) Dynamic Standing - Level of Assistance: 5: Stand by assistance (supervision) Dynamic Standing - Comments: with transfers and ambulation Extremity/Trunk Assessment       Tynia Wiers T Woods-Chance 05/22/2024, 3:43 PM

## 2024-05-22 NOTE — Progress Notes (Signed)
 Occupational Therapy Session Note  Patient Details  Name: Brandon Clark MRN: [id number] Date of Birth: 1940-10-06  Today's Date: 05/22/2024 OT Individual Time: 8652-8552 OT Individual Time Calculation (min): 60 min    Short Term Goals: Week 1:  OT Short Term Goal 1 (Week 1): STG=LTG d/t ELOS  Skilled Therapeutic Interventions/Progress Updates:  Skilled OT session completed to address functional transfers and BUE strengthening. Pt received seated in WC, agreeable to participate in therapy. Pt reports 7/10 pain in LLE, OT provided pain intervention by removing ACE wrap and repositioning. OT provided education on the importance of compression to reduce swelling; however, pt can not tolerate ACE wrap and requests to remove the remainder of the day.   Pt dependently propelled to dayroom. Pt completed the following UB exs with 7lb dumbbells to increase strength and independence during ADLs. -3x10 shoulder press -3x5 chest press  -3x10 lateral raises  -3x10 bicep curls  -3x15 modified crunches  Pt completed functional mobility with RW 53ft+50ft+ 40ft with CGA, pt required VC for RW safety. Pt displays difficulty with managing RW during transfers and prior to sitting. OT prompted pt to complete WC>EOM stand pivot transfers 3x to support independence with functional transfers and RW safety. Pt completed trial 1 with Min A to manage RW, pt attempts to sit unsafely without RW LOB noted. Trials 2 and 3 completed with CGA, pt managing RW without VC. Pt returned to room seated in [facility] with chair alarm on direct hand off to RN.  Therapy Documentation Precautions:  Precautions Precautions: Knee, Fall Precaution Booklet Issued: Yes (comment) Recall of Precautions/Restrictions: Impaired Precaution/Restrictions Comments: no pillow under knee Restrictions Weight Bearing Restrictions Per Provider Order: Yes LLE Weight Bearing Per Provider Order: Weight bearing as tolerated   Therapy/Group:  Individual Therapy  Sheppard Luckenbach Woods-Chance, MS, OTR/L 05/22/2024, 7:49 AM

## 2024-05-23 ENCOUNTER — Other Ambulatory Visit (HOSPITAL_COMMUNITY): Payer: Self-pay

## 2024-05-23 DIAGNOSIS — I2699 Other pulmonary embolism without acute cor pulmonale: Secondary | ICD-10-CM

## 2024-05-23 LAB — MAGNESIUM: Magnesium: 2.2 mg/dL (ref 1.7–2.4)

## 2024-05-23 LAB — GLUCOSE, CAPILLARY
Glucose-Capillary: 122 mg/dL — ABNORMAL HIGH (ref 70–99)
Glucose-Capillary: 119 mg/dL — ABNORMAL HIGH (ref 70–99)
Glucose-Capillary: 209 mg/dL — ABNORMAL HIGH (ref 70–99)
Glucose-Capillary: 142 mg/dL — ABNORMAL HIGH (ref 70–99)

## 2024-05-23 LAB — BASIC METABOLIC PANEL WITH GFR
Anion gap: 11 (ref 5–15)
BUN: 13 mg/dL (ref 8–23)
CO2: 28 mmol/L (ref 22–32)
Calcium: 8.5 mg/dL — ABNORMAL LOW (ref 8.9–10.3)
Chloride: 96 mmol/L — ABNORMAL LOW (ref 98–111)
Creatinine, Ser: 0.95 mg/dL (ref 0.61–1.24)
GFR, Estimated: >60 mL/min (ref >60)
Glucose, Bld: 113 mg/dL — ABNORMAL HIGH (ref 70–99)
Potassium: 3.5 mmol/L (ref 3.5–5.1)
Sodium: 135 mmol/L (ref 135–145)

## 2024-05-23 MED ORDER — OXYCODONE-ACETAMINOPHEN 5-325 MG PO TABS: 2.0000 | ORAL_TABLET | Freq: Three times a day (TID) | ORAL | 0 refills | Status: AC | PRN

## 2024-05-23 MED ORDER — APIXABAN 5 MG PO TABS: 5.0000 mg | ORAL_TABLET | Freq: Two times a day (BID) | ORAL | 0 refills | Status: AC

## 2024-05-23 MED ORDER — DONEPEZIL HCL 10 MG PO TABS
10.0000 mg | ORAL_TABLET | Freq: Every day | ORAL | 0 refills | Status: DC
  Filled 2024-05-23: qty 30, 30d supply, fill #0

## 2024-05-23 MED ORDER — FINASTERIDE 5 MG PO TABS
5.0000 mg | ORAL_TABLET | Freq: Every day | ORAL | 0 refills | Status: DC
  Filled 2024-05-23: qty 30, 30d supply, fill #0

## 2024-05-23 MED ORDER — MEMANTINE HCL 10 MG PO TABS: 10.0000 mg | ORAL_TABLET | Freq: Two times a day (BID) | ORAL | 0 refills | Status: AC

## 2024-05-23 MED ORDER — METFORMIN HCL 500 MG PO TABS: 500.0000 mg | ORAL_TABLET | Freq: Two times a day (BID) | ORAL | 0 refills | Status: AC

## 2024-05-23 MED ORDER — OXYCODONE HCL ER 15 MG PO T12A: 15.0000 mg | EXTENDED_RELEASE_TABLET | Freq: Two times a day (BID) | ORAL | 0 refills | Status: AC

## 2024-05-23 MED ORDER — ALBUTEROL SULFATE HFA 108 (90 BASE) MCG/ACT IN AERS
1.0000 | INHALATION_SPRAY | RESPIRATORY_TRACT | 0 refills | Status: DC | PRN
  Filled 2024-05-23: qty 1, fill #0

## 2024-05-23 MED ORDER — METFORMIN HCL 500 MG PO TABS
500.0000 mg | ORAL_TABLET | Freq: Two times a day (BID) | ORAL | 0 refills | Status: DC
  Filled 2024-05-23: qty 60, 30d supply, fill #0

## 2024-05-23 MED ORDER — METHOCARBAMOL 750 MG PO TABS: 750.0000 mg | ORAL_TABLET | Freq: Four times a day (QID) | ORAL | 0 refills | Status: AC | PRN

## 2024-05-23 MED ORDER — AMITRIPTYLINE HCL 10 MG PO TABS: 10.0000 mg | ORAL_TABLET | Freq: Every day | ORAL | 0 refills | Status: AC

## 2024-05-23 MED ORDER — POLYETHYLENE GLYCOL 3350 17 G PO PACK: 17.0000 g | PACK | Freq: Every day | ORAL | Status: AC | PRN

## 2024-05-23 MED ORDER — APIXABAN 5 MG PO TABS
5.0000 mg | ORAL_TABLET | Freq: Two times a day (BID) | ORAL | 0 refills | Status: DC
  Filled 2024-05-23: qty 60, 30d supply, fill #0

## 2024-05-23 MED ORDER — MECLIZINE HCL 12.5 MG PO TABS: 12.5000 mg | ORAL_TABLET | Freq: Three times a day (TID) | ORAL | 0 refills | Status: AC | PRN

## 2024-05-23 MED ORDER — DOCUSATE SODIUM 100 MG PO CAPS: 100.0000 mg | ORAL_CAPSULE | Freq: Two times a day (BID) | ORAL | Status: AC

## 2024-05-23 MED ORDER — HYDROXYZINE HCL 25 MG PO TABS
25.0000 mg | ORAL_TABLET | Freq: Three times a day (TID) | ORAL | 0 refills | Status: DC | PRN
  Filled 2024-05-23: qty 10, 4d supply, fill #0

## 2024-05-23 MED ORDER — MECLIZINE HCL 12.5 MG PO TABS
12.5000 mg | ORAL_TABLET | Freq: Three times a day (TID) | ORAL | 0 refills | Status: DC | PRN
  Filled 2024-05-23: qty 10, 4d supply, fill #0

## 2024-05-23 MED ORDER — SIMVASTATIN 10 MG PO TABS: 10.0000 mg | ORAL_TABLET | Freq: Every day | ORAL | 0 refills | Status: AC

## 2024-05-23 MED ORDER — OXYCODONE-ACETAMINOPHEN 5-325 MG PO TABS
2.0000 | ORAL_TABLET | Freq: Three times a day (TID) | ORAL | 0 refills | Status: DC | PRN
  Filled 2024-05-23: qty 30, 5d supply, fill #0

## 2024-05-23 MED ORDER — OXYCODONE HCL ER 20 MG PO T12A
20.0000 mg | EXTENDED_RELEASE_TABLET | Freq: Two times a day (BID) | ORAL | 0 refills | Status: DC
  Filled 2024-05-23: qty 28, 14d supply, fill #0

## 2024-05-23 MED ORDER — OXYCODONE HCL ER 15 MG PO T12A
15.0000 mg | EXTENDED_RELEASE_TABLET | Freq: Two times a day (BID) | ORAL | Status: DC
Start: 2024-05-23 — End: 2024-05-24
  Administered 2024-05-23 – 2024-05-24 (×2): 15 mg via ORAL
  Filled 2024-05-23 (×2): qty 1

## 2024-05-23 MED ORDER — FINASTERIDE 5 MG PO TABS: 5.0000 mg | ORAL_TABLET | Freq: Every day | ORAL | 0 refills | Status: AC

## 2024-05-23 MED ORDER — METHOCARBAMOL 750 MG PO TABS
750.0000 mg | ORAL_TABLET | Freq: Four times a day (QID) | ORAL | 0 refills | Status: DC | PRN
  Filled 2024-05-23: qty 60, 15d supply, fill #0

## 2024-05-23 MED ORDER — HYDROXYZINE HCL 25 MG PO TABS: 25.0000 mg | ORAL_TABLET | Freq: Three times a day (TID) | ORAL | 0 refills | Status: AC | PRN

## 2024-05-23 MED ORDER — SIMVASTATIN 10 MG PO TABS
10.0000 mg | ORAL_TABLET | Freq: Every day | ORAL | 0 refills | Status: DC
  Filled 2024-05-23: qty 30, 30d supply, fill #0

## 2024-05-23 MED ORDER — ALBUTEROL SULFATE HFA 108 (90 BASE) MCG/ACT IN AERS: 1.0000 | INHALATION_SPRAY | RESPIRATORY_TRACT | 0 refills | Status: AC | PRN

## 2024-05-23 MED ORDER — MELATONIN 5 MG PO TABS
5.0000 mg | ORAL_TABLET | Freq: Every day | ORAL | 0 refills | Status: DC
  Filled 2024-05-23: qty 30, 30d supply, fill #0

## 2024-05-23 MED ORDER — DONEPEZIL HCL 10 MG PO TABS: 10.0000 mg | ORAL_TABLET | Freq: Every day | ORAL | 0 refills | Status: AC

## 2024-05-23 MED ORDER — MEMANTINE HCL 10 MG PO TABS
10.0000 mg | ORAL_TABLET | Freq: Two times a day (BID) | ORAL | 0 refills | Status: DC
  Filled 2024-05-23: qty 60, 30d supply, fill #0

## 2024-05-23 MED ORDER — HYDROCHLOROTHIAZIDE 12.5 MG PO TABS: 12.5000 mg | ORAL_TABLET | Freq: Every day | ORAL | 0 refills | Status: AC

## 2024-05-23 MED ORDER — POTASSIUM CHLORIDE CRYS ER 20 MEQ PO TBCR
40.0000 meq | EXTENDED_RELEASE_TABLET | Freq: Once | ORAL | Status: AC
  Administered 2024-05-23: 40 meq via ORAL
  Filled 2024-05-23: qty 2

## 2024-05-23 MED ORDER — HYDROCHLOROTHIAZIDE 12.5 MG PO TABS
12.5000 mg | ORAL_TABLET | Freq: Every day | ORAL | 0 refills | Status: DC
Start: 2024-05-23 — End: 2024-05-23
  Filled 2024-05-23: qty 30, 30d supply, fill #0

## 2024-05-23 MED ORDER — MELATONIN 5 MG PO TABS: 5.0000 mg | ORAL_TABLET | Freq: Every day | ORAL | 0 refills | Status: AC

## 2024-05-23 MED ORDER — AMITRIPTYLINE HCL 10 MG PO TABS
10.0000 mg | ORAL_TABLET | Freq: Every day | ORAL | 0 refills | Status: DC
  Filled 2024-05-23: qty 30, 30d supply, fill #0

## 2024-05-23 NOTE — Progress Notes (Signed)
 Inpatient Rehabilitation Discharge Medication Review by a Pharmacist  A complete drug regimen review was completed for this patient to identify any potential clinically significant medication issues.  High Risk Drug Classes Is patient taking? Indication by Medication  Antipsychotic No   Anticoagulant Yes Apixaban - PE, DVT  Antibiotic No   Opioid Yes Percocet/oxycontin  - prn pain  Antiplatelet No   Hypoglycemics/insulin Yes Metformin - DM  Vasoactive Medication Yes Hydrochlorothiazide  - BP  Chemotherapy No   Other Yes Methocarbamol  prn muscle spasms Tylenol  - pain Amitriptyline - sleep, mood Donepezil , memantine - dementia Finasteride  - BPH Simvastatin  - HLD Melatonin - sleep Hydroxyzine - anxiety Meclizine  - dizziness Docusate/miralax - constipation Cetirizine  - allergy Albuterol  - short of breath     Type of Medication Issue Identified Description of Issue Recommendation(s)  Drug Interaction(s) (clinically significant)     Duplicate Therapy     Allergy     No Medication Administration End Date     Incorrect Dose     Additional Drug Therapy Needed     Significant med changes from prior encounter (inform family/care partners about these prior to discharge).    Other       Clinically significant medication issues were identified that warrant physician communication and completion of prescribed/recommended actions by midnight of the next day:  No  Name of provider notified for urgent issues identified:   Provider Method of Notification:     Pharmacist comments: None  Time spent performing this drug regimen review (minutes):  20 minutes  Sergio Batch, PharmD, [place], AAHIVP, CPP Infectious Disease Pharmacist 05/23/2024 7:39 AM

## 2024-05-23 NOTE — Progress Notes (Signed)
 Occupational Therapy Session Note  Patient Details  Name: Brandon Clark MRN: [id number] Date of Birth: 10-Mar-1941  Session 1 Today's Date: 05/23/2024 OT Individual Time: 0801-0900 OT Individual Time Calculation (min): 59 min  Session 2 Today's Date: 05/23/2024 OT Individual Time: 1300-1315 OT Individual Time Calculation (min): 15 min  OT missed time: 30 minutes Missed time reason: Fatigue, pt reporting no sleep previous night and requests to sleep. OT to make up time when available    Short Term Goals: Week 1:  OT Short Term Goal 1 (Week 1): STG=LTG d/t ELOS  Skilled Therapeutic Interventions/Progress Updates:  Session 1: Skilled OT session completed to address ADL retraining. Pt received supine in bed, agreeable to participate in therapy. Pt reports no pain. Pt respectfully declines ACE wrap and compression socks this session d/t causing pain in previous session. OT provided education of importance of donning to reduce swelling, pt verbalized understanding; however, continued to decline.   Pt completed supine>sit with supervision. STS at [facility] with CGA, pt completed task without bed being raised to A with standing. Pt completed functional mobility to bathroom with supervision, toileting completed with Mod-I. Pt completed LB bathing and dressing in bathroom, Mod-I standing intermittently with RW. Pt required Total A to don footwear, attempts to complete in 4 point sitting however, declines participation d/t pain. OT provided hand-out on sock aid to assist with donning socks in home environment. Pt reports sock-aid is useful from previous session. Toilet>WC ambulating with RW. Pt self-propelled to sink to complete UB bathing and dressing with Mod-I, pt retrieved wash cloths from bathroom to set-up for task. Pt completed oral care and personal grooming Mod-I seated in WC. Pt depedendtly propelled in WC to ADL apt. OT simulated walk-in shower transfer with 1 inch threshold to enter and  RW. Pt returned demo with supervision, VC for RW safety. Pt seated in shower chair following transfer. OT provided education on shower chair and showering within the home environment ensuring incisions are covered on LLE. Pt verbalized understanding, reports will not shower in home until cleared to get LLE wet. Pt returned back to room seated in [facility] with chair alarm on and all needs within reach.   Session 2: Skilled OT session completed to address DME education and discharge planning. Pt received supine in bed, declining therapy tx d/t fatigue. OT provided education on discharge date and importance of final therapy session. Pt continues to decline therapy and requesting to sleep. OT provides education on sock-aid and shower chair and importance of devices for independence with ADLs.  OT provides hand-out on where to purchase following discharge, of note hand-outs are in English. OT did offer to translate in Arabic, pt reports granddaughter will assist with purchasing and can read English. Pt supine in bed with bed alarm on and all needs within reach.   Therapy Documentation Precautions:  Precautions Precautions: Knee, Fall Precaution Booklet Issued: Yes (comment) Recall of Precautions/Restrictions: Impaired Precaution/Restrictions Comments: no pillow under knee Restrictions Weight Bearing Restrictions Per Provider Order: Yes LLE Weight Bearing Per Provider Order: Weight bearing as tolerated  Therapy/Group: Individual Therapy  Anniemae Haberkorn Woods-Chance, MS, OTR/L 05/23/2024, 7:54 AM

## 2024-05-23 NOTE — Progress Notes (Signed)
 Physical Therapy Session Note  Patient Details  Name: Brandon Clark MRN: [id number] Date of Birth: 10-29-1940  Today's Date: 05/23/2024 PT Individual Time: 9051-8941 PT Individual Time Calculation (min): 70 min   Short Term Goals: Week 1:  PT Short Term Goal 1 (Week 1): STG=LTG due to LOS   Skilled Therapeutic Interventions/Progress Updates:    Pt presents in w/c agreeable to session. Educated on grad day activties and d/c assessment in relation to goals. Pt denies concerns in regards to d/c. Functional transfers throughout session overall S except once instance of LOB posterior during sit > stand with min A to recover to control descent to w/c. Simulated car transfer to SUV height with pt stepping in with LLE initially - educated on fall risk with this technique, especially since reports weakness in R knee as well. Pt verbalized understanding and educated on technique to sit on seat first and bring BLE into the car. Pt able to manage BLE with extra time, CGA needed for balance with transfer. Ramp negotiation with RW with CGA for safety, close S at times. W/c propulsion on unit mod I with extra time for functional mobility training and overall strengthening/endurance. Pt limited with gait distances due to dizziness (ongoing) and pain.   Stair negotiation training for home entry practice with single rail on L x 4 steps with CGA overall for balance Repeated x 2 trials but again limited by dizziness reported. Resolved with seated rest break.    Strength and sensation testing completed in seated position in w/c (see d/c summary for details).   TUG completed for fall risk assessment (see results below)  Issued and performed HEP for continued functional strengthening, ROM, balance, and muscular endurance including the following:  Access Code: [email or web address] URL: [email or web address] Date: 05/23/2024 Prepared by: Donald  Exercises - Seated Long Arc Quad  - 1 x daily - 7 x  weekly - 3 sets - 10 reps - Seated March  - 1 x daily - 7 x weekly - 3 sets - 10 reps - Seated Heel Slide  - 1 x daily - 7 x weekly - 3 sets - 10 reps - Sit to Stand with Counter Support  - 1 x daily - 7 x weekly - 3 sets - 5 reps - Standing March with Counter Support  - 1 x daily - 7 x weekly - 3 sets - 10 reps - Standing Knee Flexion with Counter Support  - 1 x daily - 7 x weekly - 3 sets - 10 reps  Handout issued and reviewed as well. Pt expressed urgent need to use bathroom so returned to room via w/c total A. S for transfer to toilet and performed hygiene and clothing management mod I. Completed final 2 exercises in room to complete HEP and pt request to return to bed. S for transfer back to bed and repositioned in supine mod I. All needs in reach and bed alarm on.   Therapy Documentation Precautions:  Precautions Precautions: Knee, Fall Precaution Booklet Issued: Yes (comment) Recall of Precautions/Restrictions: Impaired Precaution/Restrictions Comments: no pillow under knee Restrictions Weight Bearing Restrictions Per Provider Order: No LLE Weight Bearing Per Provider Order: Weight bearing as tolerated  Pain:  Intermittent pain - rest breaks as needed for LLE WB/standing. DId not rate.  Mobility: Bed Mobility Bed Mobility: Rolling Right;Rolling Left;Sit to Supine;Supine to Sit Rolling Right: Independent with assistive device Rolling Left: Independent with assistive device Supine to Sit: Independent with assistive device Sit to Supine: Independent with  assistive device Transfers Transfers: Sit to Stand;Stand to Sit;Stand Pivot Transfers Sit to Stand: Supervision/Verbal cueing Stand to Sit: Supervision/Verbal cueing Stand Pivot Transfers: Supervision/Verbal cueing Transfer (Assistive device): Rolling walker Locomotion : Gait Ambulation: Yes Gait Assistance: Contact Guard/Touching assist Gait Distance (Feet): 50 Feet Assistive device: Rolling walker Gait Gait Pattern:  Impaired Gait Pattern: Step-to pattern;Decreased step length - right;Decreased step length - left;Decreased stance time - left;Decreased stride length;Decreased dorsiflexion - left;Decreased weight shift to left;Antalgic;Trunk flexed;Narrow base of support;Poor foot clearance - left;Poor foot clearance - right Stairs / Additional Locomotion Stairs: Yes Stairs Assistance: Contact Guard/Touching assist Stair Management Technique: One rail Left;Step to pattern;Sideways Number of Stairs: 4 Height of Stairs: 6 Ramp: Supervision/Verbal cueing Wheelchair Mobility Wheelchair Mobility: Yes Wheelchair Assistance: Independent with Scientist, Research (life Sciences): Both upper extremities Wheelchair Parts Management: Needs assistance Distance: 150'  Balance: Balance Balance Assessed: Yes Standardized Balance Assessment Standardized Balance Assessment: Timed Up and Go Test Timed Up and Go Test TUG: Normal TUG Normal TUG (seconds): 76 (with RW) Static Sitting Balance Static Sitting - Balance Support: Feet supported;Bilateral upper extremity supported Static Sitting - Level of Assistance: 7: Independent Dynamic Sitting Balance Dynamic Sitting - Balance Support: Feet supported;No upper extremity supported Dynamic Sitting - Level of Assistance: 6: Modified independent (Device/Increase time) Static Standing Balance Static Standing - Balance Support: Bilateral upper extremity supported;During functional activity (RW) Static Standing - Level of Assistance: 5: Stand by assistance (supervision) Dynamic Standing Balance Dynamic Standing - Balance Support: Bilateral upper extremity supported;During functional activity (RW) Dynamic Standing - Level of Assistance: 5: Stand by assistance (supervision) Dynamic Standing - Comments: during functional activity    Therapy/Group: Individual Therapy  [name] Elnor, PT, DPT, CBIS  05/23/2024, 11:12 AM

## 2024-05-23 NOTE — Progress Notes (Signed)
 PROGRESS NOTE   Subjective/Complaints: Asks for more magnesium  citrate for his bowels Knee pain is currently 7/10 Asked nursing to please let me know how wound looks today  ROS:  -dizziness with ambulation has resolved, constipation improved, continues to have left knee pain    Objective:   No results found. Recent Labs    05/21/24 1037  WBC 5.5  HGB 12.1*  HCT 36.6*  PLT 475*    Recent Labs    05/21/24 1037 05/23/24 0508  NA 137 135  K 3.7 3.5  CL 98 96*  CO2 29 28  GLUCOSE 159* 113*  BUN 15 13  CREATININE 0.96 0.95  CALCIUM 8.8* 8.5*     Intake/Output Summary (Last 24 hours) at 05/23/2024 1229 Last data filed at 05/23/2024 9062 Gross per 24 hour  Intake 720 ml  Output --  Net 720 ml         Physical Exam: Vital Signs Blood pressure 118/75, pulse 84, temperature 98.3 F (36.8 C), resp. rate 18, height 6' (1.829 m), weight 117.5 kg, SpO2 94%.  General: awake, alert, appropriate,  sitting up in bed after woke him up; NAD HENT: conjugate gaze; oropharynx very dry- gave water  CV: regular rate and rhythm; no JVD Pulmonary: CTA B/L; no W/R/R- good air movement GI: soft, NT, ND, (+)BS- more normoactive Psychiatric: appropriate- less irritable today Neurological: Ox3   Neurologic: Cranial nerves II through XII intact, motor strength is 5/5 in bilateral deltoid, bicep, tricep, grip, right hip flexor, knee extensors, ankle dorsiflexor and plantar flexor 4/5 Left Hip flex, knee ext 4/5  L ankle PF/DF, stable 11/20   Musculoskeletal: Full range of motion in all 4 extremities. No joint swelling except at left knee, there is diffuse swelling in the left calf with ecchymosis.  Left knee incision is covered by postoperative dressing   Assessment/Plan: 1. Functional deficits which require 3+ hours per day of interdisciplinary therapy in a comprehensive inpatient rehab setting. Physiatrist is providing  close team supervision and 24 hour management of active medical problems listed below. Physiatrist and rehab team continue to assess barriers to discharge/monitor patient progress toward functional and medical goals  Care Tool:  Bathing    Body parts bathed by patient: Right arm, Left arm, Chest, Abdomen, Front perineal area, Buttocks, Face, Left upper leg, Right upper leg   Body parts bathed by helper: Right lower leg, Left lower leg (Pt unable to reach feet at this time d/t swelling in LLE.)     Bathing assist Assist Level: Set up assist     Upper Body Dressing/Undressing Upper body dressing   What is the patient wearing?: Pull over shirt    Upper body assist Assist Level: Set up assist    Lower Body Dressing/Undressing Lower body dressing      What is the patient wearing?: Pants, Underwear/pull up     Lower body assist Assist for lower body dressing: Minimal Assistance - Patient > 75%     Toileting Toileting    Toileting assist Assist for toileting: Set up assist     Transfers Chair/bed transfer  Transfers assist     Chair/bed transfer assist level: Supervision/Verbal cueing  Locomotion Ambulation   Ambulation assist      Assist level: Supervision/Verbal cueing Assistive device: Walker-rolling Max distance: 50'   Walk 10 feet activity   Assist     Assist level: Supervision/Verbal cueing Assistive device: Walker-rolling   Walk 50 feet activity   Assist Walk 50 feet with 2 turns activity did not occur: Safety/medical concerns (pain, dizziness)  Assist level: Contact Guard/Touching assist Assistive device: Walker-rolling    Walk 150 feet activity   Assist Walk 150 feet activity did not occur: Safety/medical concerns (dizziness and pain)         Walk 10 feet on uneven surface  activity   Assist Walk 10 feet on uneven surfaces activity did not occur: Safety/medical concerns (pain, dizziness)   Assist level: Contact  Guard/Touching assist Assistive device: Walker-rolling   Wheelchair     Assist Is the patient using a wheelchair?: Yes Type of Wheelchair: Manual    Wheelchair assist level: Independent Max wheelchair distance: 150'    Wheelchair 50 feet with 2 turns activity    Assist        Assist Level: Independent   Wheelchair 150 feet activity     Assist  Wheelchair 150 feet activity did not occur: Safety/medical concerns   Assist Level: Independent   Blood pressure 118/75, pulse 84, temperature 98.3 F (36.8 C), resp. rate 18, height 6' (1.829 m), weight 117.5 kg, SpO2 94%.  Medical Problem List and Plan: 1. 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  Weightbearing as tolerated             -patient may  shower with Left knee wrapped              -ELOS/Goals: 14-16d Sup/min A  Asked nursing to please remove left knee dressing and examine incision site  Grounds pass ordered  Continue CIR PT and OT 2.  Left peroneal DVT: continue Eliquis, discussed it can take 3 months for clots to resorb             -antiplatelet therapy: N/A  3. Pain Management:  increase prn percocet to 2 tabs q8H, added tramadol  prn, decrease oxycontin  to 15mg  BID  4. Dementia: d/c amitriptyline since this can worsen cognitive function. decrease namenda to HS since can cause leg swelling which he currently has, melatonin 5 mg nightly, Aricept  10 mg nightly trazodone  50 mg nightly as needed             -antipsychotic agents: N/A  5. Neuropsych/cognition: This patient is capable of making decisions on his own behalf. 6. Skin/Wound Care: Routine skin checks 7. Fluids/Electrolytes/Nutrition: Routine ins and outs with follow-up chemistries 8.  Hypertension.  HCTZ 12.5 mg daily.  Monitor with increased mobility  11/15-11/16-  BP controlled- con't regimen and monitor with activity 9.  BPH.  Proscar  5 mg daily, add flomax  after dinner  10.   Hyperlipidemia.  Zocor  11.  Prediabetes.  Hemoglobin A1c 5.9.  SSI 12.  OSA.  CPAP ordered HS  13.  Class II obesity.  BMI 36.48-->25.13, d/c feeding supplements which he is already not using  14.  Constipation: mag citrate daily ordered as per patient's request  15. LLE edema: d/c namenda discussed ace bandaging with therapy, decrease amitritpyline to 10mg   16. Dizziness: d/c flomax , decrease amitriptyline to 10mg , resolved    LOS: 10 days A FACE TO FACE EVALUATION WAS PERFORMED  Sven SQUIBB Shirrell Solinger 05/23/2024, 12:29 PM

## 2024-05-23 NOTE — Progress Notes (Signed)
 Physical Therapy Session Note  Patient Details  Name: Brandon Clark MRN: [id number] Date of Birth: 1940-11-11  Today's Date: 05/23/2024 PT Individual Time: 8594-8554 PT Individual Time Calculation (min): 40 min   Short Term Goals: Week 1:  PT Short Term Goal 1 (Week 1): STG=LTG due to LOS  Skilled Therapeutic Interventions/Progress Updates: Pt presented in bed agreeable to therapy. Interpreter present throughout session. Session focused on therex on LLE for ROM and strengthening. Pt participated in ankle pumps, QS, AA heel slides, SLR, and hip abd/add to fatigue. PTA instructed pt in heel cord and hamstring stretches for improved knee extension. Pt initially indicated pain, upon further discussion understanding that it was more stretching discomfort from tightness (it was noted that pt had pillow behind knee upon PTA's entry in room. Dicussed with pt HEP with pt indicating good understanding of HEP. Pt also with questions regarding follow up therapy and DME. Pt left in bed at end of session with call bell within reach and needs met.      Therapy Documentation Precautions:  Precautions Precautions: Knee, Fall Precaution Booklet Issued: Yes (comment) Recall of Precautions/Restrictions: Impaired Precaution/Restrictions Comments: no pillow under knee Restrictions Weight Bearing Restrictions Per Provider Order: No LLE Weight Bearing Per Provider Order: Weight bearing as tolerated General:   Vital Signs: Therapy Vitals Temp: 97.7 F (36.5 C) Temp Source: Oral Pulse Rate: 84 Resp: 16 BP: 137/73 Patient Position (if appropriate): Lying Oxygen Therapy SpO2: 97 % O2 Device: Room Air Pain: Pain Assessment Pain Scale: 0-10 Pain Score: 8  Pain Location: Knee Pain Intervention(s): Medication (See eMAR)   Therapy/Group: Individual Therapy  [name] 05/23/2024, 3:49 PM

## 2024-05-23 NOTE — Plan of Care (Signed)
?  Problem: RH Eating ?Goal: LTG Patient will perform eating w/assist, cues/equip (OT) ?Description: LTG: Patient will perform eating with assist, with/without cues using equipment (OT) ?Outcome: Completed/Met ?  ?Problem: RH Grooming ?Goal: LTG Patient will perform grooming w/assist,cues/equip (OT) ?Description: LTG: Patient will perform grooming with assist, with/without cues using equipment (OT) ?Outcome: Completed/Met ?  ?Problem: RH Bathing ?Goal: LTG Patient will bathe all body parts with assist levels (OT) ?Description: LTG: Patient will bathe all body parts with assist levels (OT) ?Outcome: Completed/Met ?  ?Problem: RH Dressing ?Goal: LTG Patient will perform upper body dressing (OT) ?Description: LTG Patient will perform upper body dressing with assist, with/without cues (OT). ?Outcome: Completed/Met ?Goal: LTG Patient will perform lower body dressing w/assist (OT) ?Description: LTG: Patient will perform lower body dressing with assist, with/without cues in positioning using equipment (OT) ?Outcome: Completed/Met ?  ?Problem: RH Toileting ?Goal: LTG Patient will perform toileting task (3/3 steps) with assistance level (OT) ?Description: LTG: Patient will perform toileting task (3/3 steps) with assistance level (OT)  ?Outcome: Completed/Met ?  ?Problem: RH Toilet Transfers ?Goal: LTG Patient will perform toilet transfers w/assist (OT) ?Description: LTG: Patient will perform toilet transfers with assist, with/without cues using equipment (OT) ?Outcome: Completed/Met ?  ?Problem: RH Tub/Shower Transfers ?Goal: LTG Patient will perform tub/shower transfers w/assist (OT) ?Description: LTG: Patient will perform tub/shower transfers with assist, with/without cues using equipment (OT) ?Outcome: Completed/Met ?  ?

## 2024-05-24 ENCOUNTER — Other Ambulatory Visit (HOSPITAL_COMMUNITY): Payer: Self-pay

## 2024-05-24 DIAGNOSIS — R5381 Other malaise: Principal | ICD-10-CM

## 2024-05-24 LAB — GLUCOSE, CAPILLARY
Glucose-Capillary: 135 mg/dL — ABNORMAL HIGH (ref 70–99)
Glucose-Capillary: 162 mg/dL — ABNORMAL HIGH (ref 70–99)

## 2024-05-24 NOTE — Progress Notes (Signed)
 PROGRESS NOTE   Subjective/Complaints: No new complaints this morning Patient's chart reviewed- No issues reported overnight Vitals signs stable   ROS:  -dizziness with ambulation has resolved, constipation improved, continues to have left knee pain    Objective:   No results found. Recent Labs    05/21/24 1037  WBC 5.5  HGB 12.1*  HCT 36.6*  PLT 475*    Recent Labs    05/21/24 1037 05/23/24 0508  NA 137 135  K 3.7 3.5  CL 98 96*  CO2 29 28  GLUCOSE 159* 113*  BUN 15 13  CREATININE 0.96 0.95  CALCIUM 8.8* 8.5*     Intake/Output Summary (Last 24 hours) at 05/24/2024 1033 Last data filed at 05/23/2024 2000 Gross per 24 hour  Intake 296 ml  Output --  Net 296 ml         Physical Exam: Vital Signs Blood pressure 119/74, pulse 85, temperature 98 F (36.7 C), temperature source Oral, resp. rate 16, height 6' (1.829 m), weight 117.5 kg, SpO2 96%.  General: awake, alert, appropriate,  sitting up in bed after woke him up; NAD HENT: conjugate gaze; oropharynx very dry- gave water  CV: regular rate and rhythm; no JVD Pulmonary: CTA B/L; no W/R/R- good air movement GI: soft, NT, ND, (+)BS- more normoactive Psychiatric: appropriate- less irritable today Neurological: Ox3   Neurologic: Cranial nerves II through XII intact, motor strength is 5/5 in bilateral deltoid, bicep, tricep, grip, right hip flexor, knee extensors, ankle dorsiflexor and plantar flexor 4/5 Left Hip flex, knee ext 4/5  L ankle PF/DF, stable 11/21     Musculoskeletal: Full range of motion in all 4 extremities. No joint swelling except at left knee, there is diffuse swelling in the left calf with ecchymosis.  Left knee incision is covered by postoperative dressing   Assessment/Plan: 1. Functional deficits which require 3+ hours per day of interdisciplinary therapy in a comprehensive inpatient rehab setting. Physiatrist is providing  close team supervision and 24 hour management of active medical problems listed below. Physiatrist and rehab team continue to assess barriers to discharge/monitor patient progress toward functional and medical goals  Care Tool:  Bathing    Body parts bathed by patient: Right arm, Left arm, Chest, Abdomen, Front perineal area, Buttocks, Face, Left upper leg, Right upper leg   Body parts bathed by helper: Right lower leg, Left lower leg (Pt unable to reach feet at this time d/t swelling in LLE.) Body parts n/a: Left lower leg, Right lower leg (pt declined bathing of feet this date)   Bathing assist Assist Level: Supervision/Verbal cueing     Upper Body Dressing/Undressing Upper body dressing   What is the patient wearing?: Pull over shirt    Upper body assist Assist Level: Independent with assistive device    Lower Body Dressing/Undressing Lower body dressing      What is the patient wearing?: Pants, Underwear/pull up     Lower body assist Assist for lower body dressing: Supervision/Verbal cueing     Toileting Toileting    Toileting assist Assist for toileting: Supervision/Verbal cueing (Supervision for safety)     Transfers Chair/bed transfer  Transfers assist  Chair/bed transfer assist level: Supervision/Verbal cueing     Locomotion Ambulation   Ambulation assist      Assist level: Supervision/Verbal cueing Assistive device: Walker-rolling Max distance: 50'   Walk 10 feet activity   Assist     Assist level: Supervision/Verbal cueing Assistive device: Walker-rolling   Walk 50 feet activity   Assist Walk 50 feet with 2 turns activity did not occur: Safety/medical concerns (pain, dizziness)  Assist level: Contact Guard/Touching assist Assistive device: Walker-rolling    Walk 150 feet activity   Assist Walk 150 feet activity did not occur: Safety/medical concerns (dizziness and pain)         Walk 10 feet on uneven surface   activity   Assist Walk 10 feet on uneven surfaces activity did not occur: Safety/medical concerns (pain, dizziness)   Assist level: Contact Guard/Touching assist Assistive device: Walker-rolling   Wheelchair     Assist Is the patient using a wheelchair?: Yes Type of Wheelchair: Manual    Wheelchair assist level: Independent Max wheelchair distance: 150'    Wheelchair 50 feet with 2 turns activity    Assist        Assist Level: Independent   Wheelchair 150 feet activity     Assist  Wheelchair 150 feet activity did not occur: Safety/medical concerns   Assist Level: Independent   Blood pressure 119/74, pulse 85, temperature 98 F (36.7 C), temperature source Oral, resp. rate 16, height 6' (1.829 m), weight 117.5 kg, SpO2 96%.  Medical Problem List and Plan: 1. 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  Weightbearing as tolerated             -patient may  shower with Left knee wrapped              -ELOS/Goals: 11days Sup  Asked nursing to please remove left knee dressing and examine incision site  Grounds pass ordered  D/c home 2.  Left peroneal DVT: continue Eliquis , discussed it can take 3 months for clots to resorb             -antiplatelet therapy: N/A  3. Pain Management:  increase prn percocet to 2 tabs q8H, added tramadol  prn, decrease oxycontin  to 15mg  BID, d/c amitriptyline   4. Dementia: d/c amitriptyline  since this can worsen cognitive function. decrease namenda  to HS since can cause leg swelling which he currently has, melatonin 5 mg nightly, Aricept  10 mg nightly trazodone  50 mg nightly as needed             -antipsychotic agents: N/A  5. Neuropsych/cognition: This patient is capable of making decisions on his own behalf. 6. Skin/Wound Care: Routine skin checks 7. Fluids/Electrolytes/Nutrition: Routine ins and outs with follow-up chemistries 8.  Hypertension.  HCTZ 12.5 mg  daily.  Monitor with increased mobility  9.  BPH.  Proscar  5 mg daily, flomax  d/ced due to dizziness  10.  Hyperlipidemia.  Continue Zocor   11.  Prediabetes.  Hemoglobin A1c 5.9.  SSI  12.  OSA.  CPAP ordered HS, continue  13.  Class II obesity.  BMI 36.48-->25.13, d/c feeding supplements which he is already not using  14.  Constipation: mag citrate daily ordered as per patient's request, last BM 11/20  15. LLE edema: d/c namenda  discussed ace bandaging with therapy, d/c amitriptyline   16. Dizziness: d/c flomax , d/c amitriptyline    >30 minutes spent in discharge of patient including review of medications and follow-up appointments, physical examination, and in answering all  patient's questions     LOS: 11 days A FACE TO FACE EVALUATION WAS PERFORMED  Sven SQUIBB Jonahtan Manseau 05/24/2024, 10:33 AM

## 2024-05-24 NOTE — Progress Notes (Signed)
 Inpatient Rehabilitation Care Coordinator Discharge Note   Patient Details  Name: Brandon Clark MRN: [id number] Date of Birth: 08-Jun-1941   Discharge location: Home with family providing 24/7 care and support  Length of Stay: 10 days  Discharge activity level: Supervision/Verbal cueing  Home/community participation: Limited activity in the community  Patient response un:Yzjouy Literacy - How often do you need to have someone help you when you read instructions, pamphlets, or other written material from your doctor or pharmacy?: Often  Patient response un:Dnrpjo Isolation - How often do you feel lonely or isolated from those around you?: Never  Services provided included: MD, RD, PT, OT, SLP, RN, CM, TR, Pharmacy, SW  Financial Services:  Financial Services Utilized: Worker's Comp    Choices offered to/list presented to: Family  Follow-up services arranged:  Outpatient    Outpatient Servicies: PT/OT      Patient response to transportation need: Is the patient able to respond to transportation needs?: Yes In the past 12 months, has lack of transportation kept you from medical appointments or from getting medications?: No In the past 12 months, has lack of transportation kept you from meetings, work, or from getting things needed for daily living?: No   Patient/Family verbalized understanding of follow-up arrangements:  Yes  Individual responsible for coordination of the follow-up plan: Family - Granddaughter, [name]  Confirmed correct DME delivered: [employer]  Loreli 05/24/2024    Comments (or additional information): All follow-up needs addressed with Channing Maus, patients workers it sales professional. Family is aware and involved and able to address 24/7 care needs.   Summary of Stay    Date/Time Discharge Planning CSW  05/21/24 1431 Will discharge home with his son and family - [place]. Channing with workers comp has ordered all DME: 20x18  wheelchair - has 3-1 commode and RW already in the home. They will set up follow-up therapy. Recommended for OP PT/OT. DS  05/15/24 1431 Plans to discharge home with his son - [place] the [place]. Is workers comp - all follow up needs will be provided to them after recommendations solidified. DS       [employer]  Loreli
